# Patient Record
Sex: Female | Born: 1945
Health system: Southern US, Community
[De-identification: ages and names within clinical notes are randomized; demographics above are authoritative.]

## PROBLEM LIST (undated history)

## (undated) ENCOUNTER — Emergency Department (HOSPITAL_COMMUNITY): Admission: EM | Payer: Medicare Other

## (undated) DIAGNOSIS — K219 Gastro-esophageal reflux disease without esophagitis: Secondary | ICD-10-CM

## (undated) DIAGNOSIS — F419 Anxiety disorder, unspecified: Secondary | ICD-10-CM

## (undated) DIAGNOSIS — Z8489 Family history of other specified conditions: Secondary | ICD-10-CM

## (undated) DIAGNOSIS — T4145XA Adverse effect of unspecified anesthetic, initial encounter: Secondary | ICD-10-CM

## (undated) DIAGNOSIS — Z8719 Personal history of other diseases of the digestive system: Secondary | ICD-10-CM

## (undated) DIAGNOSIS — I499 Cardiac arrhythmia, unspecified: Secondary | ICD-10-CM

## (undated) DIAGNOSIS — I519 Heart disease, unspecified: Secondary | ICD-10-CM

## (undated) DIAGNOSIS — I341 Nonrheumatic mitral (valve) prolapse: Secondary | ICD-10-CM

## (undated) DIAGNOSIS — Z90710 Acquired absence of both cervix and uterus: Secondary | ICD-10-CM

## (undated) DIAGNOSIS — I10 Essential (primary) hypertension: Secondary | ICD-10-CM

## (undated) DIAGNOSIS — H409 Unspecified glaucoma: Secondary | ICD-10-CM

## (undated) DIAGNOSIS — M5430 Sciatica, unspecified side: Secondary | ICD-10-CM

## (undated) DIAGNOSIS — T8859XA Other complications of anesthesia, initial encounter: Secondary | ICD-10-CM

## (undated) DIAGNOSIS — R7303 Prediabetes: Secondary | ICD-10-CM

## (undated) DIAGNOSIS — M1990 Unspecified osteoarthritis, unspecified site: Secondary | ICD-10-CM

## (undated) DIAGNOSIS — F410 Panic disorder [episodic paroxysmal anxiety] without agoraphobia: Secondary | ICD-10-CM

## (undated) HISTORY — DX: Nonrheumatic mitral (valve) prolapse: I34.1

## (undated) HISTORY — DX: Acquired absence of both cervix and uterus: Z90.710

## (undated) HISTORY — DX: Panic disorder (episodic paroxysmal anxiety): F41.0

## (undated) HISTORY — PX: COLONOSCOPY: SHX174

## (undated) HISTORY — PX: HAND SURGERY: SHX662

## (undated) HISTORY — DX: Heart disease, unspecified: I51.9

## (undated) HISTORY — DX: Unspecified glaucoma: H40.9

## (undated) HISTORY — PX: PLANTAR FASCIA RELEASE: SHX2239

## (undated) HISTORY — DX: Sciatica, unspecified side: M54.30

## (undated) HISTORY — DX: Gastro-esophageal reflux disease without esophagitis: K21.9

## (undated) HISTORY — DX: Essential (primary) hypertension: I10

## (undated) HISTORY — DX: Anxiety disorder, unspecified: F41.9

---

## 1970-06-17 HISTORY — PX: APPENDECTOMY: SHX54

## 1976-06-17 DIAGNOSIS — Z90710 Acquired absence of both cervix and uterus: Secondary | ICD-10-CM

## 1976-06-17 HISTORY — DX: Acquired absence of both cervix and uterus: Z90.710

## 1985-06-17 HISTORY — PX: OTHER SURGICAL HISTORY: SHX169

## 2011-06-18 HISTORY — PX: CATARACT EXTRACTION: SUR2

## 2016-06-17 HISTORY — PX: HEMORRHOID SURGERY: SHX153

## 2016-07-10 DIAGNOSIS — K625 Hemorrhage of anus and rectum: Secondary | ICD-10-CM | POA: Diagnosis not present

## 2016-07-10 DIAGNOSIS — K648 Other hemorrhoids: Secondary | ICD-10-CM | POA: Diagnosis not present

## 2016-07-10 DIAGNOSIS — K6289 Other specified diseases of anus and rectum: Secondary | ICD-10-CM | POA: Diagnosis not present

## 2016-07-23 DIAGNOSIS — H401123 Primary open-angle glaucoma, left eye, severe stage: Secondary | ICD-10-CM | POA: Diagnosis not present

## 2016-07-23 DIAGNOSIS — Z961 Presence of intraocular lens: Secondary | ICD-10-CM | POA: Diagnosis not present

## 2016-07-23 DIAGNOSIS — H26493 Other secondary cataract, bilateral: Secondary | ICD-10-CM | POA: Diagnosis not present

## 2016-07-23 DIAGNOSIS — H04123 Dry eye syndrome of bilateral lacrimal glands: Secondary | ICD-10-CM | POA: Diagnosis not present

## 2016-07-23 DIAGNOSIS — H401112 Primary open-angle glaucoma, right eye, moderate stage: Secondary | ICD-10-CM | POA: Diagnosis not present

## 2016-08-28 DIAGNOSIS — K648 Other hemorrhoids: Secondary | ICD-10-CM | POA: Diagnosis not present

## 2016-08-28 DIAGNOSIS — K6289 Other specified diseases of anus and rectum: Secondary | ICD-10-CM | POA: Diagnosis not present

## 2016-08-28 DIAGNOSIS — K625 Hemorrhage of anus and rectum: Secondary | ICD-10-CM | POA: Diagnosis not present

## 2016-10-07 DIAGNOSIS — K625 Hemorrhage of anus and rectum: Secondary | ICD-10-CM | POA: Diagnosis not present

## 2016-10-07 DIAGNOSIS — K648 Other hemorrhoids: Secondary | ICD-10-CM | POA: Diagnosis not present

## 2016-10-23 DIAGNOSIS — H26493 Other secondary cataract, bilateral: Secondary | ICD-10-CM | POA: Diagnosis not present

## 2016-10-23 DIAGNOSIS — H402212 Chronic angle-closure glaucoma, right eye, moderate stage: Secondary | ICD-10-CM | POA: Diagnosis not present

## 2016-10-23 DIAGNOSIS — H402223 Chronic angle-closure glaucoma, left eye, severe stage: Secondary | ICD-10-CM | POA: Diagnosis not present

## 2016-10-23 DIAGNOSIS — H401123 Primary open-angle glaucoma, left eye, severe stage: Secondary | ICD-10-CM | POA: Diagnosis not present

## 2016-10-23 DIAGNOSIS — Z961 Presence of intraocular lens: Secondary | ICD-10-CM | POA: Diagnosis not present

## 2016-11-13 DIAGNOSIS — I509 Heart failure, unspecified: Secondary | ICD-10-CM | POA: Diagnosis not present

## 2016-11-13 DIAGNOSIS — I10 Essential (primary) hypertension: Secondary | ICD-10-CM | POA: Diagnosis not present

## 2016-11-13 DIAGNOSIS — I081 Rheumatic disorders of both mitral and tricuspid valves: Secondary | ICD-10-CM | POA: Diagnosis not present

## 2016-11-13 DIAGNOSIS — I059 Rheumatic mitral valve disease, unspecified: Secondary | ICD-10-CM | POA: Diagnosis not present

## 2016-11-13 DIAGNOSIS — R002 Palpitations: Secondary | ICD-10-CM | POA: Diagnosis not present

## 2016-11-13 DIAGNOSIS — I110 Hypertensive heart disease with heart failure: Secondary | ICD-10-CM | POA: Diagnosis not present

## 2016-12-02 DIAGNOSIS — M791 Myalgia: Secondary | ICD-10-CM | POA: Diagnosis not present

## 2016-12-03 LAB — BASIC METABOLIC PANEL
BUN: 14 (ref 4–21)
Creatinine: 0.8 (ref 0.5–1.1)
GLUCOSE: 135
Potassium: 4 (ref 3.4–5.3)
Sodium: 138 (ref 137–147)

## 2016-12-03 LAB — CBC AND DIFFERENTIAL
HCT: 38 (ref 36–46)
HEMOGLOBIN: 12.4 (ref 12.0–16.0)
NEUTROS ABS: 54
PLATELETS: 204 (ref 150–399)
WBC: 4.6

## 2016-12-03 LAB — HEPATIC FUNCTION PANEL
ALT: 14 (ref 7–35)
AST: 17 (ref 13–35)

## 2016-12-11 DIAGNOSIS — H401112 Primary open-angle glaucoma, right eye, moderate stage: Secondary | ICD-10-CM | POA: Diagnosis not present

## 2016-12-11 DIAGNOSIS — H26493 Other secondary cataract, bilateral: Secondary | ICD-10-CM | POA: Diagnosis not present

## 2016-12-11 DIAGNOSIS — H401123 Primary open-angle glaucoma, left eye, severe stage: Secondary | ICD-10-CM | POA: Diagnosis not present

## 2016-12-11 DIAGNOSIS — H402223 Chronic angle-closure glaucoma, left eye, severe stage: Secondary | ICD-10-CM | POA: Diagnosis not present

## 2016-12-12 DIAGNOSIS — Z1231 Encounter for screening mammogram for malignant neoplasm of breast: Secondary | ICD-10-CM | POA: Diagnosis not present

## 2016-12-25 DIAGNOSIS — H401112 Primary open-angle glaucoma, right eye, moderate stage: Secondary | ICD-10-CM | POA: Diagnosis not present

## 2016-12-25 DIAGNOSIS — H402212 Chronic angle-closure glaucoma, right eye, moderate stage: Secondary | ICD-10-CM | POA: Diagnosis not present

## 2016-12-25 DIAGNOSIS — H401123 Primary open-angle glaucoma, left eye, severe stage: Secondary | ICD-10-CM | POA: Diagnosis not present

## 2016-12-25 DIAGNOSIS — H402223 Chronic angle-closure glaucoma, left eye, severe stage: Secondary | ICD-10-CM | POA: Diagnosis not present

## 2017-01-09 DIAGNOSIS — M79604 Pain in right leg: Secondary | ICD-10-CM | POA: Diagnosis not present

## 2017-01-14 DIAGNOSIS — H402223 Chronic angle-closure glaucoma, left eye, severe stage: Secondary | ICD-10-CM | POA: Diagnosis not present

## 2017-01-23 DIAGNOSIS — M722 Plantar fascial fibromatosis: Secondary | ICD-10-CM | POA: Diagnosis not present

## 2017-01-23 DIAGNOSIS — M19041 Primary osteoarthritis, right hand: Secondary | ICD-10-CM | POA: Diagnosis not present

## 2017-01-23 DIAGNOSIS — M19042 Primary osteoarthritis, left hand: Secondary | ICD-10-CM | POA: Diagnosis not present

## 2017-01-29 DIAGNOSIS — H402212 Chronic angle-closure glaucoma, right eye, moderate stage: Secondary | ICD-10-CM | POA: Diagnosis not present

## 2017-07-23 DIAGNOSIS — L290 Pruritus ani: Secondary | ICD-10-CM | POA: Diagnosis not present

## 2017-07-23 DIAGNOSIS — M62838 Other muscle spasm: Secondary | ICD-10-CM | POA: Diagnosis not present

## 2017-07-23 DIAGNOSIS — K629 Disease of anus and rectum, unspecified: Secondary | ICD-10-CM | POA: Diagnosis not present

## 2017-07-23 DIAGNOSIS — K641 Second degree hemorrhoids: Secondary | ICD-10-CM | POA: Diagnosis not present

## 2017-08-20 DIAGNOSIS — K648 Other hemorrhoids: Secondary | ICD-10-CM | POA: Diagnosis not present

## 2017-08-20 DIAGNOSIS — K6289 Other specified diseases of anus and rectum: Secondary | ICD-10-CM | POA: Diagnosis not present

## 2017-08-28 DIAGNOSIS — Z7982 Long term (current) use of aspirin: Secondary | ICD-10-CM | POA: Diagnosis not present

## 2017-08-28 DIAGNOSIS — K219 Gastro-esophageal reflux disease without esophagitis: Secondary | ICD-10-CM | POA: Diagnosis not present

## 2017-08-28 DIAGNOSIS — I10 Essential (primary) hypertension: Secondary | ICD-10-CM | POA: Diagnosis not present

## 2017-08-28 DIAGNOSIS — K6289 Other specified diseases of anus and rectum: Secondary | ICD-10-CM | POA: Diagnosis not present

## 2017-08-28 DIAGNOSIS — R011 Cardiac murmur, unspecified: Secondary | ICD-10-CM | POA: Diagnosis not present

## 2017-08-28 DIAGNOSIS — R7303 Prediabetes: Secondary | ICD-10-CM | POA: Diagnosis not present

## 2017-08-28 DIAGNOSIS — Z79899 Other long term (current) drug therapy: Secondary | ICD-10-CM | POA: Diagnosis not present

## 2017-10-14 DIAGNOSIS — I081 Rheumatic disorders of both mitral and tricuspid valves: Secondary | ICD-10-CM | POA: Diagnosis not present

## 2017-10-14 DIAGNOSIS — I10 Essential (primary) hypertension: Secondary | ICD-10-CM | POA: Diagnosis not present

## 2017-10-14 DIAGNOSIS — I059 Rheumatic mitral valve disease, unspecified: Secondary | ICD-10-CM | POA: Diagnosis not present

## 2017-10-14 DIAGNOSIS — E7800 Pure hypercholesterolemia, unspecified: Secondary | ICD-10-CM | POA: Diagnosis not present

## 2017-10-14 DIAGNOSIS — I341 Nonrheumatic mitral (valve) prolapse: Secondary | ICD-10-CM | POA: Diagnosis not present

## 2017-10-24 DIAGNOSIS — Z803 Family history of malignant neoplasm of breast: Secondary | ICD-10-CM | POA: Diagnosis not present

## 2017-10-24 DIAGNOSIS — Z1231 Encounter for screening mammogram for malignant neoplasm of breast: Secondary | ICD-10-CM | POA: Diagnosis not present

## 2017-11-25 DIAGNOSIS — I059 Rheumatic mitral valve disease, unspecified: Secondary | ICD-10-CM | POA: Diagnosis not present

## 2017-11-25 DIAGNOSIS — R7301 Impaired fasting glucose: Secondary | ICD-10-CM | POA: Diagnosis not present

## 2017-11-25 DIAGNOSIS — K219 Gastro-esophageal reflux disease without esophagitis: Secondary | ICD-10-CM | POA: Diagnosis not present

## 2017-11-25 DIAGNOSIS — I10 Essential (primary) hypertension: Secondary | ICD-10-CM | POA: Diagnosis not present

## 2017-11-25 DIAGNOSIS — E7800 Pure hypercholesterolemia, unspecified: Secondary | ICD-10-CM | POA: Diagnosis not present

## 2017-11-26 DIAGNOSIS — H04123 Dry eye syndrome of bilateral lacrimal glands: Secondary | ICD-10-CM | POA: Diagnosis not present

## 2017-11-26 DIAGNOSIS — H401123 Primary open-angle glaucoma, left eye, severe stage: Secondary | ICD-10-CM | POA: Diagnosis not present

## 2017-11-26 DIAGNOSIS — H401112 Primary open-angle glaucoma, right eye, moderate stage: Secondary | ICD-10-CM | POA: Diagnosis not present

## 2017-11-27 DIAGNOSIS — H401123 Primary open-angle glaucoma, left eye, severe stage: Secondary | ICD-10-CM | POA: Diagnosis not present

## 2017-11-27 DIAGNOSIS — H401112 Primary open-angle glaucoma, right eye, moderate stage: Secondary | ICD-10-CM | POA: Diagnosis not present

## 2017-12-31 DIAGNOSIS — M722 Plantar fascial fibromatosis: Secondary | ICD-10-CM | POA: Diagnosis not present

## 2018-01-14 ENCOUNTER — Ambulatory Visit (INDEPENDENT_AMBULATORY_CARE_PROVIDER_SITE_OTHER): Payer: BLUE CROSS/BLUE SHIELD | Admitting: Family Medicine

## 2018-01-14 ENCOUNTER — Encounter: Payer: Self-pay | Admitting: Family Medicine

## 2018-01-14 ENCOUNTER — Ambulatory Visit (INDEPENDENT_AMBULATORY_CARE_PROVIDER_SITE_OTHER): Payer: BLUE CROSS/BLUE SHIELD

## 2018-01-14 VITALS — BP 126/84 | HR 67 | Temp 98.2°F | Ht 63.0 in | Wt 147.4 lb

## 2018-01-14 DIAGNOSIS — F410 Panic disorder [episodic paroxysmal anxiety] without agoraphobia: Secondary | ICD-10-CM

## 2018-01-14 DIAGNOSIS — R140 Abdominal distension (gaseous): Secondary | ICD-10-CM

## 2018-01-14 DIAGNOSIS — K219 Gastro-esophageal reflux disease without esophagitis: Secondary | ICD-10-CM

## 2018-01-14 DIAGNOSIS — H409 Unspecified glaucoma: Secondary | ICD-10-CM

## 2018-01-14 DIAGNOSIS — I10 Essential (primary) hypertension: Secondary | ICD-10-CM | POA: Diagnosis not present

## 2018-01-14 DIAGNOSIS — R1013 Epigastric pain: Secondary | ICD-10-CM | POA: Diagnosis not present

## 2018-01-14 MED ORDER — SUCRALFATE 1 G PO TABS: 1.0000 g | ORAL_TABLET | Freq: Three times a day (TID) | ORAL | 0 refills | Status: DC

## 2018-01-14 NOTE — Progress Notes (Signed)
Terri Barajas is a 72 y.o. female is here to [place].   Patient Care Team: Briscoe Deutscher, DO as PCP - General (Family Medicine) Rossie Muskrat, MD as Referring Physician (Podiatry)   History of Present Illness:   HPI: Patient c/o epigastric pain and heartburn. No radiation to right side or back. Hx of the same. S/P EGD that she said was normal. On Dexilant. Feels that it is partially due to diet. No ETOH. No CP, SOB, edema, fever, melena, vomiting. With abdominal bloating. Last BM yesterday.   Health Maintenance Due  Topic Date Due  . Hepatitis C Screening  02/05/1946  . TETANUS/TDAP  10/21/1966  . MAMMOGRAM  10/20/1997  . COLONOSCOPY  10/20/1997  . DEXA SCAN  10/20/2012  . PNA vac Low Risk Adult (1 of 2 - PCV13) 10/20/2012  . INFLUENZA VACCINE  01/15/2018   Depression screen PHQ 2/9 01/14/2018  Decreased Interest 0  Down, Depressed, Hopeless 0  PHQ - 2 Score 0    PMHx, SurgHx, SocialHx, Medications, and Allergies were reviewed in the Visit Navigator and updated as appropriate.   Past Medical History:  Diagnosis Date  . GERD (gastroesophageal reflux disease)   . Glaucoma   . Heart disease   . Hypertension      Past Surgical History:  Procedure Laterality Date  . APPENDECTOMY  1972  . [place]  . S/P Hysterectomy   1987     Family History  Problem Relation Age of Onset  . Cancer Mother   . Alcohol abuse Father   . Asthma Sister   . Hypertension Sister   . Arthritis Sister   . Diabetes Brother   . Arthritis Brother   . Hyperlipidemia Brother   . Hypertension Brother   . Arthritis Sister   . Arthritis Sister     Social History   Tobacco Use  . Smoking status: Never Smoker  . Smokeless tobacco: Never Used  Substance Use Topics  . Alcohol use: Not Currently  . Drug use: Not Currently    Current Medications and Allergies:   .  ALPRAZolam (XANAX) 0.5 MG tablet, Take 0.5 mg by mouth at bedtime as needed for  anxiety., Disp: , Rfl:  .  aspirin EC 81 MG tablet, Take 81 mg by mouth daily., Disp: , Rfl:  .  Biotin 10000 MCG TABS, Take by mouth., Disp: , Rfl:  .  dexlansoprazole (DEXILANT) 60 MG capsule, Take 60 mg by mouth daily., Disp: , Rfl:  .  hydrochlorothiazide (HYDRODIURIL) 25 MG tablet, Take 25 mg by mouth daily. One tab every other day., Disp: , Rfl:  .  latanoprost (XALATAN) 0.005 % ophthalmic solution, 1 drop at bedtime., Disp: , Rfl:  .  nadolol (CORGARD) 20 MG tablet, Take 20 mg by mouth daily., Disp: , Rfl:     Allergies  Allergen Reactions  . Atracurium & Derivatives   . Iodine   . Shellfish Allergy    Review of Systems:   Pertinent items are noted in the HPI. Otherwise, ROS is negative.  Vitals:   Vitals:   01/14/18 1502  BP: 126/84  Pulse: 67  Temp: 98.2 F (36.8 C)  TempSrc: Oral  SpO2: 94%  Weight: 147 lb 6.4 oz (66.9 kg)  Height: 5\' 3"  (1.6 m)     Body mass index is 26.11 kg/m.  Physical Exam:   Physical Exam  Constitutional: She appears well-nourished.  HENT:  Head: Normocephalic and atraumatic.  Eyes:  Pupils are equal, round, and reactive to light. EOM are normal.  Neck: Normal range of motion. Neck supple.  Cardiovascular: Normal rate, regular rhythm, normal heart sounds and intact distal pulses.  Pulmonary/Chest: Effort normal.  Abdominal: Soft. She exhibits distension. There is tenderness in the epigastric area. There is no rigidity and no guarding.  Skin: Skin is warm.  Psychiatric: She has a normal mood and affect. Her behavior is normal.  Nursing note and vitals reviewed.  Assessment and Plan:   Terri Barajas was seen today for establish care.  Diagnoses and all orders for this visit:  Dyspepsia -     CBC with Differential/Platelet -     Comprehensive metabolic panel -     H. pylori antibody, IgG -     Lipase -     sucralfate (CARAFATE) 1 g tablet; Take 1 tablet (1 g total) by mouth 4 (four) times daily -  with meals and at  bedtime.  Abdominal bloating -     DG Abd 1 View  Gastroesophageal reflux disease without esophagitis  Glaucoma, unspecified glaucoma type, unspecified laterality  Essential hypertension  Panic attacks    . Reviewed expectations re: course of current medical issues. . Discussed self-management of symptoms. . Outlined signs and symptoms indicating need for more acute intervention. . Patient verbalized understanding and all questions were answered. Marland Kitchen Health Maintenance issues including appropriate healthy diet, exercise, and smoking avoidance were discussed with patient. . See orders for this visit as documented in the electronic medical record. . Patient received an After Visit Summary.  Briscoe Deutscher, DO [place], [facility] [place] [facility] 01/15/2018

## 2018-01-14 NOTE — Patient Instructions (Signed)
Food Choices for Gastroesophageal Reflux Disease, Adult When you have gastroesophageal reflux disease (GERD), the foods you eat and your eating habits are very important. Choosing the right foods can help ease the discomfort of GERD. Consider working with a diet and nutrition specialist (dietitian) to help you make healthy food choices. What general guidelines should I follow? Eating plan  Choose healthy foods low in fat, such as fruits, vegetables, whole grains, low-fat dairy products, and lean meat, fish, and poultry.  Eat frequent, small meals instead of three large meals each day. Eat your meals slowly, in a relaxed setting. Avoid bending over or lying down until 2-3 hours after eating.  Limit high-fat foods such as fatty meats or fried foods.  Limit your intake of oils, butter, and shortening to less than 8 teaspoons each day.  Avoid the following: ? Foods that cause symptoms. These may be different for different people. Keep a food diary to keep track of foods that cause symptoms. ? Alcohol. ? Drinking large amounts of liquid with meals. ? Eating meals during the 2-3 hours before bed.  Cook foods using methods other than frying. This may include baking, grilling, or broiling. Lifestyle   Maintain a healthy weight. Ask your health care provider what weight is healthy for you. If you need to lose weight, work with your health care provider to do so safely.  Exercise for at least 30 minutes on 5 or more days each week, or as told by your health care provider.  Avoid wearing clothes that fit tightly around your waist and chest.  Do not use any products that contain nicotine or tobacco, such as cigarettes and e-cigarettes. If you need help quitting, ask your health care provider.  Sleep with the head of your bed raised. Use a wedge under the mattress or blocks under the bed frame to raise the head of the bed. What foods are not recommended? The items listed may not be a complete  list. Talk with your dietitian about what dietary choices are best for you. Grains Pastries or quick breads with added fat. French toast. Vegetables Deep fried vegetables. French fries. Any vegetables prepared with added fat. Any vegetables that cause symptoms. For some people this may include tomatoes and tomato products, chili peppers, onions and garlic, and horseradish. Fruits Any fruits prepared with added fat. Any fruits that cause symptoms. For some people this may include citrus fruits, such as oranges, grapefruit, pineapple, and lemons. Meats and other protein foods High-fat meats, such as fatty beef or pork, hot dogs, ribs, ham, sausage, salami and bacon. Fried meat or protein, including fried fish and fried chicken. Nuts and nut butters. Dairy Whole milk and chocolate milk. Sour cream. Cream. Ice cream. Cream cheese. Milk shakes. Beverages Coffee and tea, with or without caffeine. Carbonated beverages. Sodas. Energy drinks. Fruit juice made with acidic fruits (such as orange or grapefruit). Tomato juice. Alcoholic drinks. Fats and oils Butter. Margarine. Shortening. Ghee. Sweets and desserts Chocolate and cocoa. Donuts. Seasoning and other foods Pepper. Peppermint and spearmint. Any condiments, herbs, or seasonings that cause symptoms. For some people, this may include curry, hot sauce, or vinegar-based salad dressings. Summary  When you have gastroesophageal reflux disease (GERD), food and lifestyle choices are very important to help ease the discomfort of GERD.  Eat frequent, small meals instead of three large meals each day. Eat your meals slowly, in a relaxed setting. Avoid bending over or lying down until 2-3 hours after eating.  Limit high-fat   foods such as fatty meat or fried foods. This information is not intended to replace advice given to you by your health care provider. Make sure you discuss any questions you have with your health care provider. Document Released:  06/03/2005 Document Revised: 06/04/2016 Document Reviewed: 06/04/2016 Elsevier Interactive Patient Education  2018 Elsevier Inc.  

## 2018-01-15 ENCOUNTER — Encounter: Payer: Self-pay | Admitting: Family Medicine

## 2018-01-15 ENCOUNTER — Other Ambulatory Visit: Payer: Self-pay

## 2018-01-15 DIAGNOSIS — H409 Unspecified glaucoma: Secondary | ICD-10-CM | POA: Insufficient documentation

## 2018-01-15 DIAGNOSIS — F410 Panic disorder [episodic paroxysmal anxiety] without agoraphobia: Secondary | ICD-10-CM | POA: Insufficient documentation

## 2018-01-15 DIAGNOSIS — I119 Hypertensive heart disease without heart failure: Secondary | ICD-10-CM | POA: Insufficient documentation

## 2018-01-15 DIAGNOSIS — K219 Gastro-esophageal reflux disease without esophagitis: Secondary | ICD-10-CM | POA: Insufficient documentation

## 2018-01-15 DIAGNOSIS — I10 Essential (primary) hypertension: Secondary | ICD-10-CM | POA: Insufficient documentation

## 2018-01-15 LAB — CBC WITH DIFFERENTIAL/PLATELET
Basophils Absolute: 0.1 10*3/uL (ref 0.0–0.1)
Basophils Relative: 0.8 % (ref 0.0–3.0)
Eosinophils Absolute: 0.1 10*3/uL (ref 0.0–0.7)
Eosinophils Relative: 1.7 % (ref 0.0–5.0)
HCT: 38.7 % (ref 36.0–46.0)
Hemoglobin: 13.1 g/dL (ref 12.0–15.0)
Lymphocytes Relative: 19.5 % (ref 12.0–46.0)
Lymphs Abs: 1.5 10*3/uL (ref 0.7–4.0)
MCHC: 33.8 g/dL (ref 30.0–36.0)
MCV: 90.5 fl (ref 78.0–100.0)
Monocytes Absolute: 0.5 10*3/uL (ref 0.1–1.0)
Monocytes Relative: 6.8 % (ref 3.0–12.0)
Neutro Abs: 5.6 10*3/uL (ref 1.4–7.7)
Neutrophils Relative %: 71.2 % (ref 43.0–77.0)
Platelets: 236 10*3/uL (ref 150.0–400.0)
RBC: 4.27 Mil/uL (ref 3.87–5.11)
RDW: 12.7 % (ref 11.5–15.5)
WBC: 7.9 10*3/uL (ref 4.0–10.5)

## 2018-01-15 LAB — COMPREHENSIVE METABOLIC PANEL
ALT: 15 U/L (ref 0–35)
AST: 16 U/L (ref 0–37)
Albumin: 4.3 g/dL (ref 3.5–5.2)
Alkaline Phosphatase: 71 U/L (ref 39–117)
BUN: 16 mg/dL (ref 6–23)
CO2: 31 mEq/L (ref 19–32)
Calcium: 10.1 mg/dL (ref 8.4–10.5)
Chloride: 99 mEq/L (ref 96–112)
Creatinine, Ser: 0.85 mg/dL (ref 0.40–1.20)
GFR: 70.23 mL/min (ref >60.00)
Glucose, Bld: 91 mg/dL (ref 70–99)
Potassium: 3.7 mEq/L (ref 3.5–5.1)
Sodium: 138 mEq/L (ref 135–145)
Total Bilirubin: 0.6 mg/dL (ref 0.2–1.2)
Total Protein: 7.8 g/dL (ref 6.0–8.3)

## 2018-01-15 LAB — LIPASE: Lipase: 99 U/L — ABNORMAL HIGH (ref 11.0–59.0)

## 2018-01-15 LAB — H. PYLORI ANTIBODY, IGG: H Pylori IgG: NEGATIVE

## 2018-01-16 MED ORDER — NA SULFATE-K SULFATE-MG SULF 17.5-3.13-1.6 GM/177ML PO SOLN: ORAL | 0 refills | Status: DC

## 2018-01-16 NOTE — Addendum Note (Signed)
Addended by: Briscoe Deutscher R on: 01/16/2018 05:58 AM   Modules accepted: Orders

## 2018-01-16 NOTE — Progress Notes (Signed)
Sent. [email or web address] for instructions.

## 2018-01-19 ENCOUNTER — Ambulatory Visit: Payer: Self-pay | Admitting: *Deleted

## 2018-01-19 NOTE — Telephone Encounter (Signed)
Pt has questions about medication (sucrafate) states that prescription instruction, per Dr Briscoe Deutscher; she states that she was instructed to take medication with meals but label on bottle say take before meals; conference call intimated with Lowella Grip, Pharmacy Tech at [facility], and she clarified that the pt should take medication per MD instruction; the  verbalizes understanding.

## 2018-01-19 NOTE — Telephone Encounter (Signed)
Pt has questions related to instructions for taking sucrafate; see telephone encounter dated 01/19/18 Reason for Disposition . Caller has medication question only, adult not sick, and triager answers question  Protocols used: MEDICATION QUESTION CALL-A-AH

## 2018-01-21 ENCOUNTER — Other Ambulatory Visit: Payer: Self-pay

## 2018-01-21 DIAGNOSIS — R140 Abdominal distension (gaseous): Secondary | ICD-10-CM

## 2018-01-21 DIAGNOSIS — R112 Nausea with vomiting, unspecified: Secondary | ICD-10-CM

## 2018-01-22 ENCOUNTER — Ambulatory Visit (INDEPENDENT_AMBULATORY_CARE_PROVIDER_SITE_OTHER)
Admission: RE | Admit: 2018-01-22 | Discharge: 2018-01-22 | Disposition: A | Discharge status: Home / Self Care | Payer: BLUE CROSS/BLUE SHIELD | Source: Ambulatory Visit | Attending: Family Medicine | Admitting: Family Medicine

## 2018-01-22 DIAGNOSIS — R112 Nausea with vomiting, unspecified: Secondary | ICD-10-CM | POA: Diagnosis not present

## 2018-01-22 DIAGNOSIS — R140 Abdominal distension (gaseous): Secondary | ICD-10-CM | POA: Diagnosis not present

## 2018-01-22 DIAGNOSIS — R1013 Epigastric pain: Secondary | ICD-10-CM | POA: Diagnosis not present

## 2018-01-23 ENCOUNTER — Encounter: Payer: Self-pay | Admitting: Family Medicine

## 2018-01-23 ENCOUNTER — Encounter: Payer: Self-pay | Admitting: Gastroenterology

## 2018-01-23 ENCOUNTER — Other Ambulatory Visit: Payer: Self-pay

## 2018-01-23 DIAGNOSIS — I700 Atherosclerosis of aorta: Secondary | ICD-10-CM | POA: Insufficient documentation

## 2018-01-23 DIAGNOSIS — R140 Abdominal distension (gaseous): Secondary | ICD-10-CM

## 2018-02-10 ENCOUNTER — Ambulatory Visit: Payer: Self-pay | Admitting: Family Medicine

## 2018-02-17 ENCOUNTER — Encounter: Payer: Self-pay | Admitting: Gastroenterology

## 2018-02-17 ENCOUNTER — Other Ambulatory Visit (INDEPENDENT_AMBULATORY_CARE_PROVIDER_SITE_OTHER): Payer: BLUE CROSS/BLUE SHIELD

## 2018-02-17 ENCOUNTER — Encounter

## 2018-02-17 ENCOUNTER — Ambulatory Visit (INDEPENDENT_AMBULATORY_CARE_PROVIDER_SITE_OTHER): Payer: BLUE CROSS/BLUE SHIELD | Admitting: Gastroenterology

## 2018-02-17 VITALS — BP 126/70 | HR 68 | Ht 63.0 in | Wt 144.2 lb

## 2018-02-17 DIAGNOSIS — R109 Unspecified abdominal pain: Secondary | ICD-10-CM

## 2018-02-17 DIAGNOSIS — R140 Abdominal distension (gaseous): Secondary | ICD-10-CM

## 2018-02-17 DIAGNOSIS — K5900 Constipation, unspecified: Secondary | ICD-10-CM | POA: Diagnosis not present

## 2018-02-17 DIAGNOSIS — K449 Diaphragmatic hernia without obstruction or gangrene: Secondary | ICD-10-CM | POA: Diagnosis not present

## 2018-02-17 DIAGNOSIS — R748 Abnormal levels of other serum enzymes: Secondary | ICD-10-CM

## 2018-02-17 LAB — IGA: IgA: 300 mg/dL (ref 68–378)

## 2018-02-17 LAB — TSH: TSH: 1.08 u[IU]/mL (ref 0.35–4.50)

## 2018-02-17 NOTE — Patient Instructions (Signed)
Normal BMI (Body Mass Index- based on height and weight) is between 23 and 30. Your BMI today is Body mass index is 25.54 kg/m. Marland Kitchen Please consider follow up  regarding your BMI with your Primary Care Provider.  Your provider has requested that you go to the basement level for lab work before leaving today. Press "B" on the elevator. The lab is located at the first door on the left as you exit the elevator.  Please purchase the following medications over the counter and take as directed: Fibercon caps once daily Miralax one capful daily for 1 week then increase to twice daily   We have scheduled you for a follow up appointment with Dr Rush Landmark on 03/17/18 at 10:30am

## 2018-02-17 NOTE — Progress Notes (Signed)
GASTROENTEROLOGY OUTPATIENT CLINIC VISIT   Primary Care Provider Briscoe Deutscher, [place] [address] [phone]  Referring Provider Briscoe Deutscher, [address] [place], [place] [address] [phone]  Patient Profile: Kashena Novitski is a 72 y.o. female with a pmh significant for HTN, Glaucoma, MVP, GERD, reported Hiatal Hernia.  The patient presents to the [facility] for an evaluation and management of problem(s) noted below:  Problem List 1. Bloating   2. Constipation, unspecified constipation type   3. Abdominal pain, unspecified abdominal location   4. Hiatal hernia   5. Elevated lipase     History of Present Illness: This is the patient's first visit to the [facility] GI clinic.  She carries a history over the course the last few years of "GERD" for which she has been treated with Dexilant 60 mg daily for at least the last 3 to 4 years.  She describes a history of pyrosis/heartburn for which she eventually underwent at least one if not multiple upper endoscopies that reportedly showed "acid reflux changes" (not clear if this is esophagitis or just based on symptoms).  She has never been on less than Dexilant 60 mg.  The patient also describes a history of issues including chest discomfort that occurs postprandially.  This does not occur every time she eats however, if after she has eaten a meal she bends over to try and pick something up from the floor she feels a significant chest discomfort/chest pressure in the left upper quadrant and midepigastrium.  If he starts moving and walking she feels improvement in this particular area.  She describes a prior history of a possible hiatal hernia but we do not know the extent of this particular hernia.  A newer symptom over the course of the summer into this time.  Is a sensation of bloating in the abdomen with associated abdominal distention.  She does not think her diet has changed  since moving back to [place].  This bloating/abdominal distention can occur even in periods where she has not been eating.  She is tried Gas-X for this in the past as well as her Dexilant but does not feel that either of these have given her adequate relief.  With time being subside and she is able to continue in her activities of daily living.  She believes the last set of endoscopy and colonoscopy was performed in 2018 but when she looked back at some of her documentation it may have been 2015 or 2016 (we are going to work on trying to obtain these records).  She follows lifestyle modifications in regards to her acid reflux however she does not have a bed wedge but rather uses multiple pillows.  She has a reported shellfish allergy and is never had IV contrast but has not received any steroids in preparation for cross-sectional imaging.  In regards to her bowel habits, she describes having bowel movements every 2 to 3 days on occasion it is every 1 to 2 days.  The bowel movements are formed.  She almost never has to strain.  She denies any melena or hematochezia.  She has used Metamucil but feels she gets more bloated with Metamucil.  She thinks she has used MiraLAX in the past for a 1 to 2-week trial but was not sure that it made any difference for her constipation symptoms.  She has tried other over-the-counter laxatives but cannot recall the name (she thinks something start with the B).  She is on aspirin but takes no other nonsteroidals or BC/Goody powders.  GI Review of Systems Positive as above including very infrequent solid food dysphagia, nausea (only when she has significant chest pressure after bending over) Negative for odynophagia, globus, vomiting, jaundice, change in bowel movement frequency  Review of Systems General: Denies fevers/chills/weight loss HEENT: Denies oral ulcers Cardiovascular: Denies current chest pain/palpitations (describes no palpitations during time periods of her  chest pressure without radiation of the discomfort) Pulmonary: Denies shortness of breath Gastroenterological: See HPI Genitourinary: Denies hematuria or dark and urine Hematological: Denies easy bruising/bleeding Endocrine: Denies temperature intolerance Dermatological: Denies skin rashes Psychological: Mood is stable Musculoskeletal: Denies new arthralgias   Medications Current Outpatient Medications  Medication Sig Dispense Refill  . ALPRAZolam (XANAX) 0.5 MG tablet Take 0.25 mg by mouth at bedtime as needed for anxiety.     Marland Kitchen aspirin EC 81 MG tablet Take 81 mg by mouth daily.    . Biotin 10000 MCG TABS Take 1 tablet by mouth daily.     . bisacodyl (DULCOLAX) 5 MG EC tablet Take 5 mg by mouth as needed for moderate constipation.    Marland Kitchen dexlansoprazole (DEXILANT) 60 MG capsule Take 60 mg by mouth daily.    . hydrochlorothiazide (HYDRODIURIL) 25 MG tablet Take 25 mg by mouth every other day.     . latanoprost (XALATAN) 0.005 % ophthalmic solution Place 1 drop into both eyes at bedtime.     . nadolol (CORGARD) 20 MG tablet Take 20 mg by mouth daily.     No current facility-administered medications for this visit.    Allergies Allergies  Allergen Reactions  . Atracurium & Derivatives   . Atropine   . Iodine   . Shellfish Allergy    Histories Past Medical History:  Diagnosis Date  . Anxiety   . GERD (gastroesophageal reflux disease)   . Glaucoma   . Heart disease   . Hypertension   . Panic attacks    Past Surgical History:  Procedure Laterality Date  . APPENDECTOMY  1972  . [place]  . HAND SURGERY Bilateral   . HEMORRHOID SURGERY  2018  . S/P Hysterectomy   1987   Social History   Socioeconomic History  . Marital status: Married    Spouse name: Not on file  . Number of children: Not on file  . Years of education: 27  . Highest education level: Not on file  Occupational History  . Not on file  Social Needs  . Financial resource strain:  Not on file  . Food insecurity:    Worry: Not on file    Inability: Not on file  . Transportation needs:    Medical: Not on file    Non-medical: Not on file  Tobacco Use  . Smoking status: Never Smoker  . Smokeless tobacco: Never Used  Substance and Sexual Activity  . Alcohol use: Not Currently  . Drug use: Not Currently  . Sexual activity: Yes    Partners: Male  Lifestyle  . Physical activity:    Days per week: Not on file    Minutes per session: Not on file  . Stress: Not on file  Relationships  . Social connections:    Talks on phone: Not on file    Gets together: Not on file    Attends religious service: Not on file    Active member of club or organization: Not on file    Attends meetings  of clubs or organizations: Not on file    Relationship status: Not on file  . Intimate partner violence:    Fear of current or ex partner: Not on file    Emotionally abused: Not on file    Physically abused: Not on file    Forced sexual activity: Not on file  Other Topics Concern  . Not on file  Social History Narrative  . Not on file   Family History  Problem Relation Age of Onset  . Pancreatic cancer Mother   . Alcohol abuse Father   . Asthma Sister   . Hypertension Sister   . Arthritis Sister   . Diabetes Brother   . Arthritis Brother   . Hyperlipidemia Brother   . Hypertension Brother   . Glaucoma Brother   . Colon polyps Brother   . Arthritis Sister   . Arthritis Sister   . Prostate cancer Brother   . Colon cancer Neg Hx   . Esophageal cancer Neg Hx   . Inflammatory bowel disease Neg Hx   . Liver disease Neg Hx   . Rectal cancer Neg Hx   . Stomach cancer Neg Hx    I have reviewed her medical, social, and family history in detail and updated the electronic medical record as necessary.    PHYSICAL EXAMINATION  BP 126/70   Pulse 68   Ht 5\' 3"  (1.6 m)   Wt 144 lb 3.2 oz (65.4 kg)   BMI 25.54 kg/m  GEN: NAD, appears younger than stated age, doesn't appear  chronically ill PSYCH: Cooperative, without pressured speech EYE: Conjunctivae pink, sclerae anicteric ENT: MMM, without oral ulcers NECK: Supple CV: RR without R/Gs  RESP: CTAB posteriorly GI: NABS, soft, minimal tenderness to palpation in the midepigastrium, ND, without rebound or guarding, no HSM appreciated MSK/EXT: No lower extremity edema SKIN: No jaundice  NEURO:  Alert & Oriented x 3, no focal deficits   REVIEW OF DATA  I reviewed the following data at the time of this encounter:  GI Procedures and Studies  No relevant studies  Laboratory Studies  Reviewed in EPIC  Imaging Studies  8/19 CTAP w/o Contrast IMPRESSION: 1. Proximal to mid transverse colon area of mild circumferential narrowing may be related to under distension/peristalsis. Subtle mass cannot be excluded (series 5, image 18). 2. Under distended gastric fundus limits evaluation otherwise no gastric abnormality noted. 3. No extraluminal bowel inflammatory process 4. Aortic Atherosclerosis (ICD10-I70.0). Trace aortic calcifications Noted.  7/19 KUB FINDINGS: Gas and stool are present throughout the colon. No dilated loops of bowel are seen to suggest obstruction. No gross intraperitoneal free air is identified on this supine study. No acute osseous abnormality is identified. IMPRESSION: Negative.   ASSESSMENT  Ms. Saylor is a 72 y.o. female with a pmh significant for HTN, Glaucoma, MVP, GERD, reported Hiatal Hernia.  The patient is seen today for a return visit for evaluation and management of:  1. Bloating   2. Constipation, unspecified constipation type   3. Abdominal pain, unspecified abdominal location   4. Hiatal hernia   5. Elevated lipase    [name] constellation of symptoms in regards to the new onset bloating do sound as if a hiatal hernia could be playing a role.  With that being said what is interesting in her history and somewhat confusing is that she continues to describe  being told on EGDs that she has acid reflux changes on appropriate PPI therapy of Dexilant 60 mg daily.  If she is having breakthrough symptoms of true acid reflux in regards to esophagitis while being on therapy she may be a PPI failure or may need the addition of Gaviscon to try and aid in esophageal healing.  If this were true she would likely benefit from a consideration of a therapy for acid reflux.  She may require additional pH impedance if this were to be the case.  However I do think that other differentials including SIBO should be considered in this patient as well.  We will do a stepwise diagnostic evaluation as well as try to obtain all of her records from [place].  I would like to review these in depth to understand what she has meant and with her last endoscopic therapies were performed.  She describes never being told that she is been tested for H. pylori.  Although she is on PPI therapy and we recognize a false negative can occur when performing a stool antigen I would like to proceed with a stool antigen with this understanding and she recognizes this as well.  If positive we will treat.  I also described for the patient a need to obtain labs in the future such as pancreatic enzymes and liver tests just to ensure that she is not having some sort of biliary colic and she will obtain these within 6 to 8 hours of a severe episode of discomfort.  I would eventually like to bring down her PPI therapy to 30 mg if she can tolerate it however if they were true as it relates to the changes that are still occurring then we need to understand that.  If she is confirmed to have a hiatal hernia we may need to consider timed esophagram to evaluate for length of hiatal hernia should an endoscopy not be reperformed and then consider a referral to surgery.  We discussed also continuing lifestyle modifications for acid reflux and obtaining a bed wedge which she will try to do in the course the next few weeks.   Her slight elevation in lipase that was checked by her primary care doctor does not meet criteria for pancreatitis and although her cross-sectional imaging did not show any evidence of pancreatitis (it was without IV contrast) I do not believe her symptoms to be ongoing pancreatic but we will maintain this our minds moving forward.  All questions answered to the best of my ability today.   PLAN  1. Bloating - Workup for possible HH ongoing - Consideration of SIBO testing in future  2. Constipation, unspecified constipation type - Begin Fibercon OTC - Start Miralax once daily and then titrate to twice daily (if gets significant bloating then may hold on this - May consider role of Amitiza or Linzess if remains a significant issue  3. Abdominal pain, unspecified abdominal location - TSH; Future - Tissue transglutaminase, IgA; Future - Helicobacter pylori antigen det, stool; Future - IgA; Future - Amylase; Future - Lipase; Future - Hepatic function panel; Future - Will consider role of contrasted CTAP in future (though would need to be with steroid preparation)  4. Hiatal hernia - Obtain outside reports of endoscopy - Hold on surgical referral currently   Orders Placed This Encounter  Procedures  . TSH  . Tissue transglutaminase, IgA  . Helicobacter pylori antigen det, stool  . IgA  . Amylase  . Lipase  . Hepatic function panel    New Prescriptions   No medications on file   Modified Medications   No medications  on file    Planned Follow Up: No follow-ups on file.   [name], MD [facility] Office # [phone]

## 2018-02-18 DIAGNOSIS — K5900 Constipation, unspecified: Secondary | ICD-10-CM | POA: Insufficient documentation

## 2018-02-18 DIAGNOSIS — R109 Unspecified abdominal pain: Secondary | ICD-10-CM | POA: Insufficient documentation

## 2018-02-18 DIAGNOSIS — K449 Diaphragmatic hernia without obstruction or gangrene: Secondary | ICD-10-CM | POA: Insufficient documentation

## 2018-02-18 DIAGNOSIS — R140 Abdominal distension (gaseous): Secondary | ICD-10-CM | POA: Insufficient documentation

## 2018-02-18 DIAGNOSIS — R748 Abnormal levels of other serum enzymes: Secondary | ICD-10-CM | POA: Insufficient documentation

## 2018-02-18 LAB — TISSUE TRANSGLUTAMINASE, IGA: (TTG) AB, IGA: 1 U/mL

## 2018-02-19 ENCOUNTER — Other Ambulatory Visit (INDEPENDENT_AMBULATORY_CARE_PROVIDER_SITE_OTHER): Payer: BLUE CROSS/BLUE SHIELD

## 2018-02-19 ENCOUNTER — Other Ambulatory Visit: Payer: Self-pay | Admitting: Gastroenterology

## 2018-02-19 ENCOUNTER — Encounter: Payer: Self-pay | Admitting: Gastroenterology

## 2018-02-19 DIAGNOSIS — R140 Abdominal distension (gaseous): Secondary | ICD-10-CM

## 2018-02-19 DIAGNOSIS — K449 Diaphragmatic hernia without obstruction or gangrene: Secondary | ICD-10-CM | POA: Diagnosis not present

## 2018-02-19 DIAGNOSIS — R109 Unspecified abdominal pain: Secondary | ICD-10-CM | POA: Diagnosis not present

## 2018-02-19 DIAGNOSIS — K5900 Constipation, unspecified: Secondary | ICD-10-CM

## 2018-02-19 LAB — LIPASE: Lipase: 101 U/L — ABNORMAL HIGH (ref 11.0–59.0)

## 2018-02-19 LAB — HEPATIC FUNCTION PANEL
ALBUMIN: 4.2 g/dL (ref 3.5–5.2)
ALK PHOS: 70 U/L (ref 39–117)
ALT: 12 U/L (ref 0–35)
AST: 14 U/L (ref 0–37)
Bilirubin, Direct: 0 mg/dL (ref 0.0–0.3)
Total Bilirubin: 0.5 mg/dL (ref 0.2–1.2)
Total Protein: 7.6 g/dL (ref 6.0–8.3)

## 2018-02-19 LAB — AMYLASE: Amylase: 29 U/L (ref 27–131)

## 2018-02-19 NOTE — Progress Notes (Signed)
Patient was seen in office 02/17/18 with complaints of bloating and and abdominal pain. She was instructed by Dr Rush Landmark to come by the lab if she had a flare up within 6 hours to have labs drawn. Patient feels her symptoms are "severe" enough to have her labs done. Hepatic function,Lipase,Amylase drawn. Dr Rush Landmark will review results

## 2018-02-20 LAB — HELICOBACTER PYLORI  SPECIAL ANTIGEN
MICRO NUMBER:: 91062343
SPECIMEN QUALITY: ADEQUATE

## 2018-02-21 ENCOUNTER — Other Ambulatory Visit: Payer: Self-pay | Admitting: Gastroenterology

## 2018-02-21 ENCOUNTER — Encounter: Payer: Self-pay | Admitting: Gastroenterology

## 2018-02-21 DIAGNOSIS — R748 Abnormal levels of other serum enzymes: Secondary | ICD-10-CM

## 2018-02-21 DIAGNOSIS — R109 Unspecified abdominal pain: Secondary | ICD-10-CM

## 2018-02-21 NOTE — Progress Notes (Signed)
If she continues to have an elevation in lipase, may consider role of contrasted CT scan with steroid preparation after additional diagnostic workup as had been outlined is completed.  Interestingly, amylase is normal.  Will be improtant to see what her numbers looks like (LFTs/Amylase/Lipase) after a significant episode of pain for which she knows to return and get labs in 6-8 hours.  Negative for HP but on Dexilant so could be false-negative.  [name], MD [facility] Office # [phone]

## 2018-02-26 ENCOUNTER — Telehealth: Payer: Self-pay | Admitting: Gastroenterology

## 2018-02-26 NOTE — Telephone Encounter (Signed)
Your liver tests are normal.  Your thyroid function test is normal.  Your amylase (a pancreatic enzyme) was normal.  Your lipase (another pancreatic enzyme) was slightly high (not 2x the upper limit of normal) and does not meet criteria for pancreatitis.   Make sure, as we spoke in clinic, if you have a severe episode of pain/discomfort that you go and get labs as we discussed within 6-8 hours so we can see what your lab tests look like.  The pt has been advised of the labs and will keep appt as planned to discuss continued bloating

## 2018-03-01 ENCOUNTER — Encounter: Payer: Self-pay | Admitting: Gastroenterology

## 2018-03-01 DIAGNOSIS — K2270 Barrett's esophagus without dysplasia: Secondary | ICD-10-CM | POA: Insufficient documentation

## 2018-03-01 NOTE — Progress Notes (Signed)
Review of records  March 2015 CT abdomen pelvis without contrast (due to iodine allergy) Asymmetrical thickening of the right wall bladder.  A bladder mass/bladder carcinoma cannot be excluded. GI tract shows a nondistended global thickening of the gastric wall mucosa with a greater curve thickness about 17.7 mm.  Small bowel mildly thickened loops of jejunum.  No obstruction.  Right colon is outlined by contrast.  Appendix not identified.  Oral contrast reaches the right colon.  Stool-filled left colon.  No acute diverticulitis.  Rectal wall 9.9 mm without free air or abscess.  Calcification anterior to the right psoas.  No mesenteric lymph nodes or retroperitoneal lymph nodes.  June March 2002 colonoscopy (only a handwritten report) Polyps, no diverticula with plan for a 3 to 5-year follow-up colonoscopy.  Pathology consistent with small reactive lymphoid aggregate.  December 2010 upper endoscopy (only a handwritten report) Findings gastritis Pathology consistent with antral mucosa without pathologic findings and negative for H. pylori or intestinal metaplasia.  February 2015 upper endoscopy (only a handwritten report) Mild NERD, mild gastritis, benign polyps Pathology consistent with normal duodenal biopsies with preserved villous architecture and no increased intraepithelial lymphocytosis.  Gastric biopsies with minimal chronic inflammation and negative for H. pylori and intestinal metaplasia.  Gastric polyp suggestive of fundic gland polyp.  GE junction showing mild chronic inflammation and intestinal metaplasia consistent with Barrett's esophagus.  October 2016 upper endoscopy Normal hypopharynx. Z line regular at 39. Nonsevere reflux esophagitis biopsies performed to rule out Barrett's. A few gastric polyps biopsied. Few papules in the stomach biopsied. Normal mucosa and body biopsied. Normal ampulla and examined duodenum biopsy. Pathology consistent with: Normal duodenum.  With mild  chronic antral gastritis negative for H. pylori.  Gastric biopsy consistent with mild chronic gastritis negative for H. pylori and intestinal metaplasia.  Gastric polyps consistent with fundic gland polyp.  GE junction biopsy consistent with focal intestinal metaplasia suggestive of Barrett's.   At this point based on the results that we have obtained she does have a diagnosis of short segment Barrett's esophagus.  Problem list will be updated.  She will be due for an upper endoscopy this year to document no dysplasia and then at which point she can go to 3 to 5-year follow-up.  In none of her reports that she noted to have a hiatal hernia.  I think it would be reasonable to do a barium swallow prior to an upper endoscopy to evaluate for evidence of hiatal hernia.  She is also due for colonoscopy as no other colonoscopies were noted since 2002.  For now we will plan to proceed with scheduling a barium swallow and then an endoscopy and colonoscopy because of these findings and results from outside records. I will have my staff reach out to patient to alert her as to reasoning for procedures in the coming weeks.  [name], MD [facility] Office # [phone]

## 2018-03-03 ENCOUNTER — Other Ambulatory Visit: Payer: Self-pay

## 2018-03-03 DIAGNOSIS — Z1211 Encounter for screening for malignant neoplasm of colon: Secondary | ICD-10-CM

## 2018-03-03 DIAGNOSIS — R140 Abdominal distension (gaseous): Secondary | ICD-10-CM

## 2018-03-03 DIAGNOSIS — K2270 Barrett's esophagus without dysplasia: Secondary | ICD-10-CM

## 2018-03-03 DIAGNOSIS — K3189 Other diseases of stomach and duodenum: Secondary | ICD-10-CM

## 2018-03-03 DIAGNOSIS — K2990 Gastroduodenitis, unspecified, without bleeding: Secondary | ICD-10-CM

## 2018-03-03 DIAGNOSIS — K2970 Gastritis, unspecified, without bleeding: Secondary | ICD-10-CM

## 2018-03-03 DIAGNOSIS — R109 Unspecified abdominal pain: Secondary | ICD-10-CM

## 2018-03-03 NOTE — Progress Notes (Signed)
The patient has been notified of this information and all questions answered. The pt has been advised of the information and verbalized understanding.    

## 2018-03-03 NOTE — Progress Notes (Signed)
You have been scheduled for a Barium Esophogram at [facility] (1st floor of the hospital) on 03/16/18 at 1030 am. Please arrive 15 minutes prior to your appointment for registration. Make certain not to have anything to eat or drink 3 hours prior to your test. If you need to reschedule for any reason, please contact radiology at [phone] to do so. __________________________________________________________________ A barium swallow is an examination that concentrates on views of the esophagus. This tends to be a double contrast exam (barium and two liquids which, when combined, create a gas to distend the wall of the oesophagus) or single contrast (non-ionic iodine based). The study is usually tailored to your symptoms so a good history is essential. Attention is paid during the study to the form, structure and configuration of the esophagus, looking for functional disorders (such as aspiration, dysphagia, achalasia, motility and reflux) EXAMINATION You may be asked to change into a gown, depending on the type of swallow being performed. A radiologist and radiographer will perform the procedure. The radiologist will advise you of the type of contrast selected for your procedure and direct you during the exam. You will be asked to stand, sit or lie in several different positions and to hold a small amount of fluid in your mouth before being asked to swallow while the imaging is performed .In some instances you may be asked to swallow barium coated marshmallows to assess the motility of a solid food bolus. The exam can be recorded as a digital or video fluoroscopy procedure. POST PROCEDURE It will take 1-2 days for the barium to pass through your system. To facilitate this, it is important, unless otherwise directed, to increase your fluids for the next 24-48hrs and to resume your normal diet.  This test typically takes about 30 minutes to  perform. __________________________________________________________________________________

## 2018-03-11 ENCOUNTER — Telehealth: Payer: Self-pay | Admitting: Gastroenterology

## 2018-03-11 NOTE — Telephone Encounter (Signed)
The pt was concerned about the BE scheduled for 9/30.  She thought it was an endoscopy and was nervous

## 2018-03-11 NOTE — Telephone Encounter (Signed)
Pt is scheduled for a test on 03/16/18 and has to be npo for 3 hours. She has some questions about it. Pls call her.

## 2018-03-16 ENCOUNTER — Ambulatory Visit (HOSPITAL_COMMUNITY)
Admission: RE | Admit: 2018-03-16 | Discharge: 2018-03-16 | Disposition: A | Discharge status: Home / Self Care | Payer: BLUE CROSS/BLUE SHIELD | Source: Ambulatory Visit | Attending: Gastroenterology | Admitting: Gastroenterology

## 2018-03-16 DIAGNOSIS — K2270 Barrett's esophagus without dysplasia: Secondary | ICD-10-CM

## 2018-03-16 DIAGNOSIS — K3189 Other diseases of stomach and duodenum: Secondary | ICD-10-CM | POA: Diagnosis not present

## 2018-03-16 DIAGNOSIS — K2970 Gastritis, unspecified, without bleeding: Secondary | ICD-10-CM

## 2018-03-16 DIAGNOSIS — R140 Abdominal distension (gaseous): Secondary | ICD-10-CM

## 2018-03-16 DIAGNOSIS — R109 Unspecified abdominal pain: Secondary | ICD-10-CM | POA: Diagnosis not present

## 2018-03-16 DIAGNOSIS — K2990 Gastroduodenitis, unspecified, without bleeding: Secondary | ICD-10-CM | POA: Insufficient documentation

## 2018-03-16 DIAGNOSIS — Z1211 Encounter for screening for malignant neoplasm of colon: Secondary | ICD-10-CM | POA: Diagnosis not present

## 2018-03-16 DIAGNOSIS — R1310 Dysphagia, unspecified: Secondary | ICD-10-CM | POA: Diagnosis not present

## 2018-03-17 ENCOUNTER — Encounter: Payer: Self-pay | Admitting: Gastroenterology

## 2018-03-17 ENCOUNTER — Ambulatory Visit: Payer: Medicare Other | Admitting: Gastroenterology

## 2018-03-17 VITALS — BP 132/86 | HR 80 | Ht 63.0 in | Wt 144.0 lb

## 2018-03-17 DIAGNOSIS — R140 Abdominal distension (gaseous): Secondary | ICD-10-CM

## 2018-03-17 DIAGNOSIS — K5909 Other constipation: Secondary | ICD-10-CM

## 2018-03-17 DIAGNOSIS — R1013 Epigastric pain: Secondary | ICD-10-CM | POA: Diagnosis not present

## 2018-03-17 DIAGNOSIS — K2270 Barrett's esophagus without dysplasia: Secondary | ICD-10-CM

## 2018-03-17 NOTE — Patient Instructions (Addendum)
BARRETT'S ESOPHAGUS OVERVIEW-The esophagus is the tube that connects the mouth with the stomach (figure 1). Barrett's esophagus occurs when the normal cells that line the lower part of the esophagus (called squamous cells) are replaced by a different cell type (called intestinal cells). This process usually occurs as a result of repetitive damage to the inside of the esophagus caused by longstanding gastroesophageal reflux disease (GERD). In people with GERD, the esophagus is repeatedly exposed to excessive amounts of stomach acid and bile. Interestingly, the intestinal cells of Barrett's esophagus are more resistant to acid and bile than squamous cells, suggesting that these cells may develop to protect the esophagus from acid exposure. The problem is that the intestinal cells have a risk of transforming into cancer cells. More detailed information about Barrett's esophagus is available separately. (See "Barrett's esophagus: Epidemiology, clinical manifestations, and diagnosis" and "Barrett's esophagus: Surveillance and management".) BARRETT'S ESOPHAGUS RISK FACTORS-There are a number of factors that increase the risk of developing Barrett's esophagus: Age-Barrett's esophagus is most commonly diagnosed in middle-aged and older adults; the average age at diagnosis is approximately 55 years. Children can develop Barrett's esophagus, but rarely before the age of five years. Gender-Men are more commonly diagnosed with Barrett's esophagus than women. Ethnic background-Barrett's esophagus is most common in white populations, less common in Hispanic populations, and uncommon in Asian and black populations. Obesity-Barrett's esophagus is especially associated with the central type of obesity in which fat accumulates predominantly in the abdomen. Lifestyle-Smokers are more commonly diagnosed with Barrett's esophagus than nonsmokers. Family history-A history of Barrett's esophagus or adenocarcinoma of  the esophagus in a first-degree relative increases one's risk of having Barrett's esophagus. BARRETT'S ESOPHAGUS SYMPTOMS-Barrett's esophagus itself produces no symptoms. Instead, most people seek help because of symptoms of GERD, including heartburn, regurgitation of stomach contents, and, less commonly, difficulty swallowing. BARRETT'S ESOPHAGUS DIAGNOSIS-A healthcare provider may suspect Barrett's esophagus based upon a person's symptoms and the risk factors described above. An endoscopy is needed to confirm the abnormal esophageal lining. Upper endoscopy-Upper endoscopy is a test that allows your doctor to see the inside of the esophagus and stomach. Before the test, you are sedated to prevent discomfort. The doctor will insert a thin lighted tube into the esophagus. The tube has a camera, which allows the doctor to see the lining of the esophagus. Normally, the lining should appear pale and glossy; in a person with Barrett's esophagus, the lining appears pink or red and velvety. If the endoscopic appearance suggests Barrett's esophagus, the doctor will remove a small sample of the lining (a biopsy) during the endoscopy so that it can be examined with a microscope for signs of Barrett's. (See "Patient education: Upper endoscopy (Beyond the Basics)".) Endoscopy detects most (>80 percent) but not all cases of Barrett's esophagus. Individual variations in the anatomy of the esophagus and the area where it meets the stomach can make the diagnosis of Barrett's esophagus difficult in some people. BARRETT'S ESOPHAGUS TREATMENT-The goals of treatment in patients with Barrett's esophagus are to control reflux symptoms and to prevent the Barrett's from turning into cancer. Aggressive reflux treatment may be more effective in preventing cancer than treating only when there are reflux symptoms. (See "Barrett's esophagus: Surveillance and management".) Behavior and diet changes-The first priority in treating  Barrett's esophagus is to stop the damage to the esophageal lining, which usually means eliminating acid reflux. Most patients are advised to avoid certain foods and behaviors that increase the risk of reflux. Foods that can worsen reflux include: ?  Chocolate ?Coffee and tea ?Peppermint ?Alcohol ?Fatty foods Acidic juices such as orange or tomato juice may also worsen symptoms. Carbonated beverages can be a problem for some people. (See "Patient education: Acid reflux (gastroesophageal reflux disease) in adults (Beyond the Basics)".) Behaviors that can worsen reflux include eating meals just before going to bed, lying down soon after eating meals, and eating very large meals. Placing bricks or blocks under the head of the bed (to raise it by about six inches) can help to keep acid in the stomach while sleeping. It is not helpful to use additional pillows under the head. Medications-A clinician may prescribe medications that reduce the amount of acid produced by the stomach. A class of medications called proton pump inhibitors is commonly recommended. Six different formulations (some of which are available as a generic) are currently available: omeprazole (Prilosec), esomeprazole (Nexium), lansoprazole (Prevacid), dexlansoprazole (Dexilant), rabeprazole (Aciphex) and pantoprazole (Protonix); any of these is an acceptable option. Surgery-Some people with GERD may benefit from surgical procedures to reduce reflux. Surgery is not the best treatment in all situations, so you should discuss this option with your doctor. More information about surgical treatments for reflux is available in a separate topic review. (See "Patient education: Acid reflux (gastroesophageal reflux disease) in adults (Beyond the Basics)".) BARRETT'S ESOPHAGUS COMPLICATIONS-One potential complication of Barrett's esophagus is that, over time, the abnormal esophageal lining can develop early precancerous changes. The early changes  may progress to advanced precancerous changes, and finally to frank esophageal cancer. If undetected, this cancer can spread and invade surrounding tissues. However, progression to cancer is uncommon for any individual patient; studies that follow patients with Barrett's esophagus reveal that fewer than 0.5 percent of patients develop esophageal cancer per year. Furthermore, patients with Barrett's esophagus appear to live approximately as long as people who are free of this condition. Patients often die of other causes before Barrett's esophagus progresses to cancer. BARRETT'S ESOPHAGUS MONITORING-Monitoring for precancerous changes is recommended for most patients with Barrett's esophagus. At this time, monitoring includes periodic endoscopy with tissue biopsy. (See "Patient education: Upper endoscopy (Beyond the Basics)".) Although new technologies for monitoring are on the horizon, most are still considered to be experimental. Experts do not agree about the usefulness of monitoring. The benefits of monitoring depend upon each person's chance of developing esophageal cancer, which may be difficult to determine. Benefits-Reasons to perform endoscopic monitoring include: ?Monitoring can detect curable, precancerous changes (dysplasia) in the esophageal lining. These changes may indicate that the person has an increased risk of cancer. Early detection may be especially important for younger patients. Precancerous changes in Barrett's esophagus usually are treated with endoscopic procedures, rather than with the surgery, radiation and chemotherapy needed to treat invasive cancers. (See 'Endoscopic eradication therapy' below.) ?Monitoring may detect cancer at an earlier stage, when it can be more effectively treated. Limitations-However, not all patients will benefit from endoscopic monitoring. ?Progression of Barrett's esophagus to cancer is uncommon. ?Endoscopy carries certain risks and often causes  anxiety. ?Endoscopy may miss areas with premalignant changes or cancer. ?Even if endoscopy detects a cancer, the available treatment options may have unacceptably high risks. ?There is no proof that endoscopic monitoring prolongs life or prevents death from cancer of the esophagus. PRECANCEROUS CHANGES AND BARRETT'S ESOPHAGUS Confirmation and staging-If precancerous changes are discovered, they should be confirmed by a second pathologist, an expert in examining tissue samples. It is sometimes difficult to correctly identify precancerous changes, especially when there is inflammation (usually caused by the ongoing  reflux of acid). Many clinicians increase the dose of acid-suppressing medications in this situation. The precancerous changes must then be graded as "low-grade dysplasia", "high-grade dysplasia," or "intramucosal carcinoma" depending upon their severity. Treatment options-The management of low-grade dysplasia is controversial. Some physicians recommend frequent endoscopic surveillance for patients with low-grade dysplasia, while others recommend destroying the abnormal tissue with endoscopic eradication therapy (see below). Either option is acceptable, but recent studies suggest that endoscopic eradication therapy might be preferable. Endoscopic eradication therapy generally is recommended for the treatment of high-grade dysplasia or intramucosal carcinoma in Barrett's esophagus. Endoscopic eradication therapy-Endoscopic eradication therapy can be used to treat all grades of dysplasia in Barrett's esophagus. Endoscopic eradication therapy usually involves a combination of endoscopic mucosal resection to remove any worrisome areas identified by the endoscopists and endoscopic ablation of the remaining Barrett's esophagus, usually with radiofrequency ablation. Endoscopic mucosal resection-Endoscopic mucosal resection (EMR) involves the removal of a large but thin area of esophageal tissue  through an endoscope. EMR provides large tissue specimens that can be examined by the pathologist to determine the character and extent of the abnormality and determine if an adequate amount of tissue was removed. Therefore, it can help to confirm the person's diagnosis and can completely treat the abnormality (if the abnormal tissue is removed completely). However, this technique is generally performed only in specialized centers. Typically, EMR is performed if the endoscopist sees a worrisome area of nodularity in the Barrett's esophagus. EMR is commonly followed by ablation of the remaining Barrett's esophagus, usually with radiofrequency ablation. (See 'Radiofrequency ablation' below.) Radiofrequency ablation-Radiofrequency ablation (RFA) is an endoscopic procedure that uses radiofrequency energy (microwaves) to destroy the Barrett's cells. In short-term studies, RFA has been shown to prevent high-grade dysplasia from progressing to cancer and to prevent low-grade dysplasia from developing more advanced features. However, there is limited information on the long-term outcome of this approach. In approximately 5 percent of patients, the procedure causes a complication, such as narrowing of the esophagus, which may require repeated treatments to open the esophagus. Another concern with RFA is that, in a small minority of patients with high-grade dysplasia or intramucosal carcinoma (less than 2 percent), there may be cancer in the lymph nodes adjacent to the esophagus. RFA cannot cure cancer in the lymph nodes. In all cases, the patient and family should discuss the risks and benefits of possible treatments with a healthcare provider. Finally, Barrett's esophagus can recur even after it has been completely eradicated by RFA. Therefore, RFA does not eliminate the need for endoscopic monitoring. SUMMARY-Despite the uncertainties surrounding the monitoring and treatment of Barrett's esophagus, there is  consensus on one matter: The available options should be tailored to the individual patient. The following are general guidelines: ?People with Barrett's esophagus should be treated with a proton pump inhibitor. This may improve or eliminate symptoms of heartburn, reduce inflammation, help prevent complications, and improve the accuracy of endoscopy evaluation. ?People without evidence of precancerous changes (ie, no dysplasia) or esophageal cancer should have endoscopy performed every three to five years to look for the development of precancerous changes, unless there are other medical conditions that increase the small risks usually associated with endoscopy. ?If endoscopy reveals a precancerous change (dysplasia), this finding should be confirmed by at least one expert; if necessary, additional tissue samples should be collected to resolve any doubt. ?People with early precancerous changes (low-grade dysplasia) often are treated with endoscopic eradication therapy. Another acceptable option is to have repeat endoscopy at 6 and 12 months,  followed by annual endoscopy if the lesion does not appear to progress.  ?People with confirmed, advanced precancerous changes (high-grade dysplasia or intramucosal carcinoma) should have endoscopic eradication therapy.  We have scheduled you for a follow up appointment with Dr Rush Landmark on 04/23/18 at 11am  Thank you for entrusting me with your care and choosing [facility].  Dr Rush Landmark

## 2018-03-18 ENCOUNTER — Ambulatory Visit: Payer: BLUE CROSS/BLUE SHIELD | Admitting: Gastroenterology

## 2018-03-18 NOTE — Progress Notes (Signed)
[place] VISIT   Primary Care Provider Briscoe Deutscher, [place] [address] [phone]  Patient Profile: Terri Barajas is a 72 y.o. female with a pmh significant for HTN, Glaucoma, MVP, GERD, reported Hiatal Hernia.  The patient presents to the [facility] for an evaluation and management of problem(s) noted below:  Problem List 1. Barrett's esophagus without dysplasia   2. Bloating   3. Epigastric pain   4. Other constipation     History of Present Illness: This is a patient who is carried a diagnosis of "GERD" for years and was previously treated with Dexilant 60 mg daily for at least 4 years.  Previously had a history of pyrosis and heartburn and had undergone multiple endoscopies which showed acid related changes and acid reflux.  After obtaining records from [place] it looks like the patient has had Barrett's esophagus with non-dysplasia and her last endoscopy was in 2016.  She has had issues of chest pain chest discomfort postprandially especially when she bends over and previously has been described as having a hiatal hernia.  However based on the records that we obtained from [place] is not clear that a documented hiatal hernia has been seen on any imaging or on esophagus evaluation via EGD.  The discomfort does not occur every time she eats but mostly when she bends over she feels a sensation of fullness and tightness in the midepigastrium and subxiphoid region.  Bloating and abdominal distention were new symptoms and signs occurred after she moved to [place].  She had trialed Gas-X for this in the past without significant relief.  She describes having had a prior colonoscopy in 2015 2016 although no records were obtained as of yet the patient feels the latter is more likely.  (We will work on trying to obtain these).  W she has followed lifestyle modifications for her acid reflux but does not own  a bed wedge as of yet.  She has a shellfish allergy and has never had IV contrast.  She has bowel movements every 1 to 3 days and does not have to strain.  The past she had used Metamucil as well as MiraLAX but only for short periods and time.  On last encounter we discussed initiating fiber supplementation with FiberCon as well as using MiraLAX on a daily basis to try and help keep her stools soft.    Interval History: Since initiating the FiberCon supplementation the patient has been doing slightly better in regards to her bloating.  She still does describe issues of wanting to eat ice cream but developing bloating.  She is previously tried lactose-free milk as well as almond milk and silk without significant improvements.  She is currently taking skim milk with some ability to tolerate that.  She underwent a barium esophagram which did not show overt evidence of a hiatal hernia.  As noted in previous documentation does seem that she has Barrett's esophagus but she was not clear about knowing this diagnosis.  GI Review of Systems Positive as above including chest pressure after bending over and having a large meal Negative for odynophagia, globus, vomiting, change in bowel movement frequency  Review of Systems General: Denies fevers/chills/weight loss Cardiovascular: Denies current chest pain/palpitations Pulmonary: Denies shortness of breath Gastroenterological: See HPI Genitourinary: Denies dark and urine Hematological: Denies easy bruising/bleeding Psychological: Mood is stable and excited about her next grandchild being born soon Musculoskeletal: Denies new arthralgias   Medications Current Outpatient Medications  Medication Sig Dispense Refill  . ALPRAZolam (XANAX) 0.5 MG tablet Take 0.25 mg by mouth at bedtime as needed for anxiety.     Marland Kitchen aspirin EC 81 MG tablet Take 81 mg by mouth daily.    . Biotin 10000 MCG TABS Take 1 tablet by mouth daily.     . bisacodyl (DULCOLAX) 5 MG EC  tablet Take 5 mg by mouth as needed for moderate constipation.    Marland Kitchen dexlansoprazole (DEXILANT) 60 MG capsule Take 60 mg by mouth daily.    . hydrochlorothiazide (HYDRODIURIL) 25 MG tablet Take 25 mg by mouth every other day.     . latanoprost (XALATAN) 0.005 % ophthalmic solution Place 1 drop into both eyes at bedtime.     . nadolol (CORGARD) 20 MG tablet Take 20 mg by mouth daily.     No current facility-administered medications for this visit.    Allergies Allergies  Allergen Reactions  . Atracurium & Derivatives   . Atropine   . Iodine   . Shellfish Allergy    Histories Past Medical History:  Diagnosis Date  . Anxiety   . GERD (gastroesophageal reflux disease)   . Glaucoma   . Heart disease   . Hypertension   . Panic attacks    Past Surgical History:  Procedure Laterality Date  . APPENDECTOMY  1972  . [place]  . HAND SURGERY Bilateral   . HEMORRHOID SURGERY  2018  . S/P Hysterectomy   1987   Social History   Socioeconomic History  . Marital status: Married    Spouse name: Not on file  . Number of children: Not on file  . Years of education: 66  . Highest education level: Not on file  Occupational History  . Not on file  Social Needs  . Financial resource strain: Not on file  . Food insecurity:    Worry: Not on file    Inability: Not on file  . Transportation needs:    Medical: Not on file    Non-medical: Not on file  Tobacco Use  . Smoking status: Never Smoker  . Smokeless tobacco: Never Used  Substance and Sexual Activity  . Alcohol use: Not Currently  . Drug use: Not Currently  . Sexual activity: Yes    Partners: Male  Lifestyle  . Physical activity:    Days per week: Not on file    Minutes per session: Not on file  . Stress: Not on file  Relationships  . Social connections:    Talks on phone: Not on file    Gets together: Not on file    Attends religious service: Not on file    Active member of club or organization:  Not on file    Attends meetings of clubs or organizations: Not on file    Relationship status: Not on file  . Intimate partner violence:    Fear of current or ex partner: Not on file    Emotionally abused: Not on file    Physically abused: Not on file    Forced sexual activity: Not on file  Other Topics Concern  . Not on file  Social History Narrative  . Not on file   Family History  Problem Relation Age of Onset  . Pancreatic cancer Mother   . Alcohol abuse Father   . Asthma Sister   . Hypertension Sister   . Arthritis Sister   . Diabetes Brother   . Arthritis Brother   .  Hyperlipidemia Brother   . Hypertension Brother   . Glaucoma Brother   . Colon polyps Brother   . Arthritis Sister   . Arthritis Sister   . Prostate cancer Brother   . Colon cancer Neg Hx   . Esophageal cancer Neg Hx   . Inflammatory bowel disease Neg Hx   . Liver disease Neg Hx   . Rectal cancer Neg Hx   . Stomach cancer Neg Hx    I have reviewed her medical, social, and family history in detail and updated the electronic medical record as necessary.    PHYSICAL EXAMINATION  BP 132/86   Pulse 80   Ht 5\' 3"  (1.6 m)   Wt 144 lb (65.3 kg)   BMI 25.51 kg/m  GEN: NAD, appears younger than stated age, doesn't appear chronically ill PSYCH: Cooperative, without pressured speech EYE: Conjunctivae pink, sclerae anicteric ENT: MMM, without oral ulcers NECK: Supple CV: RR without R/Gs  RESP: CTAB posteriorly GI: NABS, soft, nontender/nondistended, without rebound or guarding, no HSM appreciated MSK/EXT: No lower extremity edema SKIN: No jaundice  NEURO:  Alert & Oriented x 3, no focal deficits   REVIEW OF DATA  I reviewed the following data at the time of this encounter:  GI Procedures and Studies  No relevant studies  Laboratory Studies  Reviewed in EPIC  Imaging Studies  September 2019 esophagram IMPRESSION: Normal double contrast barium esophagram.   ASSESSMENT  Terri Barajas is a  72 y.o. female with a pmh significant for HTN, Glaucoma, MVP, GERD, reported Hiatal Hernia.  The patient is seen today for a return visit for evaluation and management of:  1. Barrett's esophagus without dysplasia   2. Bloating   3. Epigastric pain   4. Other constipation    The patient seems to be stable to doing well overall with her current regimen of acid suppression as well as fiber supplementation with use of laxatives.  She is actually asking whether she can decrease her laxative use to every other day because there are periods where she does have looser stools and I think that is reasonable.  Her bloating and distention has been somewhat better while on her current medications.  She has been keeping a diary of foods and wonders about whether this improvement right now we will continue for the upcoming future.  We had previously discussed consideration of a bacterial overgrowth evaluation but for now we will hold off on that.  We did discuss her diagnosis of Barrett's esophagus which she previously was not clear why she was having endoscopy so frequently.  Her pathology has shown intestinal metaplasia on her to most recent endoscopies in 2015/2016.  As such even though she has non-dysplasia she is due for a follow-up endoscopy at this time and we will plan on pursuing this later this year or early next year.  Her previously described issue of having a hiatal hernia has not been clearly delineated on her endoscopy reports or on recent barium esophagram.  I think we will try to clarify this further based on her endoscopy in case she has a small sliding hiatal hernia.  I do think that if she continues to have issues of acid reflux breaking through with findings of a small hiatal hernia she may benefit from Gaviscon.  She is going back up [place] for the next few weeks to be with her son and daughter-in-law as they are having a child and so she will be away for a few weeks  and will return on follow-up and  will determine whether any further testing will be indicated.   PLAN  1. Barrett's esophagus without dysplasia - Due for Surveillance will plan later this year (patient wants to be seen in clinic when she returns prior to scheduling) - Continue PPI current dosing  2. Bloating - Workup for possible HH ongoing though recent Esophagram did not make mention - Consideration of SIBO testing in future if worsens  3. Epigastric pain - Not a significant issue today - Will consider role of contrasted CTAP in future (though would need to be with steroid preparation)  4. Other constipation - Continue Fibercon OTC - Continue Miralax QD-QOD dependent on stool) - May consider role of Amitiza or Linzess if remains a significant issue   No orders of the defined types were placed in this encounter.   New Prescriptions   No medications on file   Modified Medications   No medications on file    Planned Follow Up: No follow-ups on file.   [name], MD [facility] Advanced [facility]oscopy Office # [phone]

## 2018-03-19 ENCOUNTER — Encounter: Payer: Self-pay | Admitting: Gastroenterology

## 2018-03-24 ENCOUNTER — Encounter: Payer: Self-pay | Admitting: Gastroenterology

## 2018-03-24 NOTE — Progress Notes (Signed)
Review of outside records from [place]  October 2016 clinic visit assessment suggestive of Barrett's esophagus without dysplasia and need to continue Dexilant.  October 2016 upper endoscopy Hypopharynx normal Z line regular at 39 cm Nonsevere esophagitis at the GE junction biopsies obtained Few small sessile polyps in the gastric body status post forcep histology Few small sized papules/nodules in the gastric antrum with biopsies taken for forceps Cardia gastric fundus were normal Ampulla and duodenum were normal and visualized portions Biopsies obtained for celiac disease evaluation  Pathology results Duodenal biopsies negative for celiac disease and no increased intraepithelial lymphocytes Antral nodularity biopsies show mild chronic gastritis negative for H. pylori or fungal stains Gastric biopsies of the antrum and body show mild chronic gastritis with no intestinal metaplasia no H. pylori Gastric polyp in the fundus returned as fundic gland polyp no intestinal metaplasia no H. pylori GE junction biopsies showed moderate chronic inflammation and focal intestinal metaplasia consistent with Barrett's esophagus A rectal polyp was removed and that was normal colonic mucosa not hyperplastic or adenoma   Based on these findings the patient's next colonoscopy without a history of prior adenomatous polyps will not be until 2026.  [name], MD [facility] Advanced [facility]oscopy Office # [phone]

## 2018-04-17 ENCOUNTER — Ambulatory Visit: Payer: Medicare Other | Admitting: Family Medicine

## 2018-04-19 NOTE — Progress Notes (Deleted)
Terri Barajas is a 72 y.o. female is here for follow up.  History of Present Illness:   HPI: See Assessment and Plan section for Problem Based Charting of issues discussed today.   Health Maintenance Due  Topic Date Due  . Hepatitis C Screening  03-24-1946  . TETANUS/TDAP  10/21/1966  . MAMMOGRAM  10/20/1997  . COLONOSCOPY  10/20/1997  . DEXA SCAN  10/20/2012  . PNA vac Low Risk Adult (1 of 2 - PCV13) 10/20/2012  . INFLUENZA VACCINE  01/15/2018   Depression screen PHQ 2/9 01/14/2018  Decreased Interest 0  Down, Depressed, Hopeless 0  PHQ - 2 Score 0   PMHx, SurgHx, SocialHx, FamHx, Medications, and Allergies were reviewed in the Visit Navigator and updated as appropriate.   Patient Active Problem List   Diagnosis Date Noted  . Barrett's esophagus 03/01/2018  . Elevated lipase 02/18/2018  . Hiatal hernia 02/18/2018  . Abdominal pain 02/18/2018  . Constipation 02/18/2018  . Bloating 02/18/2018  . Aortic atherosclerosis ([place]) 01/23/2018  . GERD (gastroesophageal reflux disease)   . Glaucoma   . Hypertension   . Panic attacks    Social History   Tobacco Use  . Smoking status: Never Smoker  . Smokeless tobacco: Never Used  Substance Use Topics  . Alcohol use: Not Currently  . Drug use: Not Currently   Current Medications and Allergies:   Current Outpatient Medications:  .  ALPRAZolam (XANAX) 0.5 MG tablet, Take 0.25 mg by mouth at bedtime as needed for anxiety. , Disp: , Rfl:  .  aspirin EC 81 MG tablet, Take 81 mg by mouth daily., Disp: , Rfl:  .  Biotin 10000 MCG TABS, Take 1 tablet by mouth daily. , Disp: , Rfl:  .  bisacodyl (DULCOLAX) 5 MG EC tablet, Take 5 mg by mouth as needed for moderate constipation., Disp: , Rfl:  .  dexlansoprazole (DEXILANT) 60 MG capsule, Take 60 mg by mouth daily., Disp: , Rfl:  .  hydrochlorothiazide (HYDRODIURIL) 25 MG tablet, Take 25 mg by mouth every other day. , Disp: , Rfl:  .  latanoprost (XALATAN) 0.005 % ophthalmic  solution, Place 1 drop into both eyes at bedtime. , Disp: , Rfl:  .  nadolol (CORGARD) 20 MG tablet, Take 20 mg by mouth daily., Disp: , Rfl:   Allergies  Allergen Reactions  . Atracurium & Derivatives   . Atropine   . Iodine   . Shellfish Allergy    Review of Systems   Pertinent items are noted in the HPI. Otherwise, ROS is negative.  Vitals:  There were no vitals filed for this visit.   There is no height or weight on file to calculate BMI.  Physical Exam:   Physical Exam  Results for orders placed or performed in visit on 93/57/01  Helicobacter pylori special antigen  Result Value Ref Range   MICRO NUMBER: [id number]    SPECIMEN QUALITY ADEQUATE    SOURCE: STOOL    STATUS: FINAL    RESULT:      Not Detected  Antimicrobials, proton pump inhibitors, and bismuth preparations inhibit H. pylori and ingestion up to two weeks prior to testing may cause false negative results. If clinically indicated the test should be repeated on a new specimen  obtained two weeks after discontinuing treatment.   Amylase  Result Value Ref Range   Amylase 29 27 - 131 U/L  Hepatic function panel  Result Value Ref Range   Total Bilirubin 0.5 0.2 -  1.2 mg/dL   Bilirubin, Direct 0.0 0.0 - 0.3 mg/dL   Alkaline Phosphatase 70 39 - 117 U/L   AST 14 0 - 37 U/L   ALT 12 0 - 35 U/L   Total Protein 7.6 6.0 - 8.3 g/dL   Albumin 4.2 3.5 - 5.2 g/dL  Lipase  Result Value Ref Range   Lipase 101.0 (H) 11.0 - 59.0 U/L    Assessment and Plan:   No problem-specific Assessment & Plan notes found for this encounter.   No orders of the defined types were placed in this encounter.   No orders of the defined types were placed in this encounter.   . Reviewed expectations re: course of current medical issues. . Discussed self-management of symptoms. . Outlined signs and symptoms indicating need for more acute intervention. . Patient verbalized understanding and all questions were answered. Marland Kitchen Health  Maintenance issues including appropriate healthy diet, exercise, and smoking avoidance were discussed with patient. . See orders for this visit as documented in the electronic medical record. . Patient received an After Visit Summary.  Briscoe Deutscher, DO [place] 04/19/2018

## 2018-04-20 ENCOUNTER — Ambulatory Visit: Payer: Medicare Other | Admitting: Family Medicine

## 2018-04-23 ENCOUNTER — Encounter: Payer: Self-pay | Admitting: Gastroenterology

## 2018-04-23 ENCOUNTER — Encounter (INDEPENDENT_AMBULATORY_CARE_PROVIDER_SITE_OTHER): Payer: Self-pay

## 2018-04-23 ENCOUNTER — Ambulatory Visit: Payer: Medicare Other | Admitting: Gastroenterology

## 2018-04-23 VITALS — BP 102/70 | HR 62 | Ht 63.0 in | Wt 147.0 lb

## 2018-04-23 DIAGNOSIS — K2270 Barrett's esophagus without dysplasia: Secondary | ICD-10-CM

## 2018-04-23 DIAGNOSIS — T883XXS Malignant hyperthermia due to anesthesia, sequela: Secondary | ICD-10-CM

## 2018-04-23 DIAGNOSIS — T883XXA Malignant hyperthermia due to anesthesia, initial encounter: Secondary | ICD-10-CM | POA: Insufficient documentation

## 2018-04-23 DIAGNOSIS — K5909 Other constipation: Secondary | ICD-10-CM | POA: Diagnosis not present

## 2018-04-23 DIAGNOSIS — R140 Abdominal distension (gaseous): Secondary | ICD-10-CM | POA: Diagnosis not present

## 2018-04-23 NOTE — Patient Instructions (Signed)
Normal BMI (Body Mass Index- based on height and weight) is between 19 and 25. Your BMI today is Body mass index is 26.04 kg/m. Marland Kitchen Please consider follow up  regarding your BMI with your Primary Care Provider.  We will contact you mid December to schedule your [facility] for the first AM case.  Thank you for entrusting me with your care and choosing [facility] care.  Dr Rush Landmark

## 2018-04-23 NOTE — Progress Notes (Signed)
[place] VISIT   Primary Care Provider Briscoe Deutscher, [place] [id number] [phone]  Patient Profile: Terri Barajas is a 72 y.o. female with a pmh significant for HTN, Glaucoma, MVP, GERD, reported Hiatal Hernia.  The patient presents to the [facility] for an evaluation and management of problem(s) noted below:  Problem List 1. Barrett's esophagus without dysplasia   2. Malignant hyperthermia, sequela   3. Bloating   4. Other constipation     History of Present Illness: This is a patient who has carried a diagnosis of "GERD" for years and was previously treated with Dexilant 60 mg daily for at least 4 years.  Previously had a history of pyrosis and heartburn and had undergone multiple endoscopies which showed acid related changes and acid reflux.  After obtaining records from [place] it looks like the patient has had Barrett's esophagus with non-dysplasia and her last endoscopy was in 2016.  She has had issues of chest pain chest discomfort postprandially especially when she bends over and previously has been described as having a hiatal hernia.  However based on the records that we obtained from [place] is not clear that a documented hiatal hernia has been seen on any imaging or on esophagus evaluation via EGD.  The discomfort does not occur every time she eats but mostly when she bends over she feels a sensation of fullness and tightness in the midepigastrium and subxiphoid region.  Bloating and abdominal distention were new symptoms and signs occurred after she moved to [place].  She had trialed Gas-X for this in the past without significant relief.  She describes having had a prior colonoscopy in 2015 2016 although no records were obtained as of yet the patient feels the latter is more likely.  (We will work on trying to obtain these).  W she has followed lifestyle modifications for her acid reflux  but does not own a bed wedge as of yet.  She has a shellfish allergy and has never had IV contrast.  She has bowel movements every 1 to 3 days and does not have to strain.  The past she had used Metamucil as well as MiraLAX but only for short periods and time.  On last encounter we discussed initiating fiber supplementation with FiberCon as well as using MiraLAX on a daily basis to try and help keep her stools soft.  When we had seen her in follow-up the patient had been doing well on FiberCon supplementation in regards to her bloating.  She had also tried lactose-free milk as well as almond milk and silk without significant improvements but was able to tolerate skim milk.  She underwent a barium esophagram which did not show an overt hiatal hernia but as documented previously she had Barrett's and we described this entity as well as the need for follow-up as her last endoscopy was in 2016 without any dysplastic changes.  She is planning to go see her grandchild who was just recently born and was plan for follow-up in November or December.  Interval History: The patient returns for scheduled follow-up.  She continues to do well with her current regimen.  We were able to obtain her colonoscopy report and she is not due for colon cancer screening until 2026 based on her colonoscopy from 2016 and no prior history of adenomatous polyps per her report.  We discussed briefly the consideration of SIBO breath testing.  Weight has been stable.  No evidence  of any blood in the stools.  We did discuss her history from a family perspective of potential malignant hyperthermia and that all of her prior procedures have been done in the hospital in the setting of malignant hyperthermia evaluation.  She describes previously having a muscle biopsy performed but we do not have the records.  GI Review of Systems Positive as above Negative for odynophagia, vomiting, change in bowel movement frequency  Review of  Systems General: Denies fevers/chills/weight loss Cardiovascular: Denies current chest pain Pulmonary: Denies shortness of breath Gastroenterological: See HPI Genitourinary: Denies dark and urine Hematological: Denies easy bruising/bleeding Psychological: Mood is stable   Medications Current Outpatient Medications  Medication Sig Dispense Refill  . ALPRAZolam (XANAX) 0.5 MG tablet Take 0.25 mg by mouth at bedtime as needed for anxiety.     Marland Kitchen aspirin EC 81 MG tablet Take 81 mg by mouth daily.    . Biotin 10000 MCG TABS Take 1 tablet by mouth daily.     . bisacodyl (DULCOLAX) 5 MG EC tablet Take 5 mg by mouth as needed for moderate constipation.    Marland Kitchen dexlansoprazole (DEXILANT) 60 MG capsule Take 60 mg by mouth daily.    . hydrochlorothiazide (HYDRODIURIL) 25 MG tablet Take 25 mg by mouth every other day.     . latanoprost (XALATAN) 0.005 % ophthalmic solution Place 1 drop into both eyes at bedtime.     . nadolol (CORGARD) 20 MG tablet Take 20 mg by mouth daily.     No current facility-administered medications for this visit.    Allergies Allergies  Allergen Reactions  . Atracurium & Derivatives   . Atropine   . Iodine   . Shellfish Allergy    Histories Past Medical History:  Diagnosis Date  . Anxiety   . GERD (gastroesophageal reflux disease)   . Glaucoma   . Heart disease   . Hypertension   . Panic attacks    Past Surgical History:  Procedure Laterality Date  . APPENDECTOMY  1972  . [place]  . HAND SURGERY Bilateral   . HEMORRHOID SURGERY  2018  . S/P Hysterectomy   1987   Social History   Socioeconomic History  . Marital status: Married    Spouse name: Not on file  . Number of children: Not on file  . Years of education: 37  . Highest education level: Not on file  Occupational History  . Not on file  Social Needs  . Financial resource strain: Not on file  . Food insecurity:    Worry: Not on file    Inability: Not on file  .  Transportation needs:    Medical: Not on file    Non-medical: Not on file  Tobacco Use  . Smoking status: Never Smoker  . Smokeless tobacco: Never Used  Substance and Sexual Activity  . Alcohol use: Not Currently  . Drug use: Not Currently  . Sexual activity: Yes    Partners: Male  Lifestyle  . Physical activity:    Days per week: Not on file    Minutes per session: Not on file  . Stress: Not on file  Relationships  . Social connections:    Talks on phone: Not on file    Gets together: Not on file    Attends religious service: Not on file    Active member of club or organization: Not on file    Attends meetings of clubs or organizations: Not on file  Relationship status: Not on file  . Intimate partner violence:    Fear of current or ex partner: Not on file    Emotionally abused: Not on file    Physically abused: Not on file    Forced sexual activity: Not on file  Other Topics Concern  . Not on file  Social History Narrative  . Not on file   Family History  Problem Relation Age of Onset  . Pancreatic cancer Mother   . Alcohol abuse Father   . Asthma Sister   . Hypertension Sister   . Arthritis Sister   . Diabetes Brother   . Arthritis Brother   . Hyperlipidemia Brother   . Hypertension Brother   . Glaucoma Brother   . Colon polyps Brother   . Arthritis Sister   . Arthritis Sister   . Prostate cancer Brother   . Colon cancer Neg Hx   . Esophageal cancer Neg Hx   . Inflammatory bowel disease Neg Hx   . Liver disease Neg Hx   . Rectal cancer Neg Hx   . Stomach cancer Neg Hx    I have reviewed her medical, social, and family history in detail and updated the electronic medical record as necessary.    PHYSICAL EXAMINATION  BP 102/70   Pulse 62   Ht 5' 3"  (1.6 m)   Wt 147 lb (66.7 kg)   BMI 26.04 kg/m  GEN: NAD, appears younger than stated age, doesn't appear chronically ill PSYCH: Cooperative, without pressured speech EYE: Conjunctivae pink, sclerae  anicteric ENT: MMM, without oral ulcers NECK: Supple CV: RR without R/Gs  RESP: CTAB posteriorly GI: NABS, soft, nontender/nondistended, without rebound or guarding, no HSM appreciated MSK/EXT: No lower extremity edema SKIN: No jaundice  NEURO:  Alert & Oriented x 3, no focal deficits   REVIEW OF DATA  I reviewed the following data at the time of this encounter:  GI Procedures and Studies  No new studies to review they have been documented previously in the chart as outpatient documentation  Laboratory Studies  Reviewed in EPIC  Imaging Studies  No new studies   ASSESSMENT  Terri Barajas is a 72 y.o. female with a pmh significant for HTN, Glaucoma, MVP, GERD, reported Hiatal Hernia.  The patient is seen today for a return visit for evaluation and management of:  1. Barrett's esophagus without dysplasia   2. Malignant hyperthermia, sequela   3. Bloating   4. Other constipation    The patient is clinically and hemodynamically stable and seems to be doing well on her current dosing of fiber supplementation.  We did discuss in the setting of her bloating that seems to be stable to improved since we initially met her that small intestine bacterial overgrowth may be something reasonable to consider in the future for evaluation.  She would like to defer on this currently because of how she is feeling.  I think that is reasonable.  In 2019 she will be 3 years since her last endoscopic evaluation for history of Barrett's without dysplasia.  We discussed the role of surveillance and evaluation to ensure she has not developed low-grade or high-grade dysplasia.  She is not having significant dysphagia currently but has had the infrequent sensation when she leans forward of something in her chest but much less so than previously being noted.  She has a family history of malignant hyperthermia and reports always being done in the hospital setting because of this concern although she reports  a  muscle biopsy that was reportedly negative.  With this history I think is just reasonable for the patient to have a procedure in the hospital but we will try and obtain the results of her muscle biopsy to help [place] moving forward.  The risks and benefits of endoscopic evaluation were discussed with the patient; these include but are not limited to the risk of perforation, infection, bleeding, missed lesions, lack of diagnosis, severe illness requiring hospitalization, as well as anesthesia and sedation related illnesses.  The patient is agreeable to proceed.  All patient questions were answered, to the best of my ability, and the patient agrees to the aforementioned plan of action with follow-up as indicated.   PLAN  1. Barrett's esophagus without dysplasia - Plan for EGD for surveillance - Continue current PPI dosing  2. Malignant hyperthermia, sequela - Obtain records of previous muscle biopsy but for now plan for procedures at the hospital  3. Bloating - Hold on SIBO evaluation  4. Other constipation - Continue Fibercon OTC - Continue Miralax QD-QOD dependent on stool) - May consider role of Amitiza or Linzess if remains a significant issue   No orders of the defined types were placed in this encounter.   New Prescriptions   No medications on file   Modified Medications   No medications on file    Planned Follow Up: No follow-ups on file.   [name], MD [facility] Office # [phone]

## 2018-04-26 ENCOUNTER — Encounter: Payer: Self-pay | Admitting: Gastroenterology

## 2018-04-27 NOTE — Progress Notes (Signed)
Terri Barajas is a 72 y.o. female is here for follow up.  History of Present Illness:   Lonell Grandchild, CMA acting as scribe for Dr. Briscoe Deutscher.    HPI: Patient in office for follow up. She is taking all medications as prescribed. She has been having increased dry mouth in the morning. Snores. Not sure about apneic events.   Health Maintenance Due  Topic Date Due  . Hepatitis C Screening  1946-03-05  . TETANUS/TDAP  10/21/1966  . MAMMOGRAM  10/20/1997  . COLONOSCOPY  10/20/1997  . DEXA SCAN  10/20/2012  . PNA vac Low Risk Adult (1 of 2 - PCV13) 10/20/2012   Depression screen PHQ 2/9 01/14/2018  Decreased Interest 0  Down, Depressed, Hopeless 0  PHQ - 2 Score 0   PMHx, SurgHx, SocialHx, FamHx, Medications, and Allergies were reviewed in the Visit Navigator and updated as appropriate.   Patient Active Problem List   Diagnosis Date Noted  . Situational anxiety 04/29/2018  . MVP (mitral valve prolapse) 04/29/2018  . Sleep-disordered breathing 04/29/2018  . Family History of Hyperthermia, malignant 04/23/2018  . Barrett's esophagus 03/01/2018  . Elevated lipase 02/18/2018  . Hiatal hernia 02/18/2018  . Constipation 02/18/2018  . Aortic atherosclerosis ([place]) 01/23/2018  . GERD (gastroesophageal reflux disease)   . Glaucoma   . Hypertension    Social History   Tobacco Use  . Smoking status: Never Smoker  . Smokeless tobacco: Never Used  Substance Use Topics  . Alcohol use: Not Currently  . Drug use: Not Currently   Current Medications and Allergies:   .  ALPRAZolam (XANAX) 0.5 MG tablet, Take 0.25 mg by mouth at bedtime as needed for anxiety.  Marland Kitchen  aspirin EC 81 MG tablet, Take 81 mg by mouth daily., Disp: , Rfl:  .  Biotin 10000 MCG TABS, Take 1 tablet by mouth daily. , Disp: , Rfl:  .  bisacodyl (DULCOLAX) 5 MG EC tablet, Take 5 mg by mouth as needed for moderate constipation .  dexlansoprazole (DEXILANT) 60 MG capsule, Take 60 mg by mouth daily., Disp: ,  Rfl:  .  hydrochlorothiazide (HYDRODIURIL) 25 MG tablet, Take 25 mg by mouth every other day.  .  latanoprost (XALATAN) 0.005 % ophthalmic solution, Place 1 drop into both eyes at bedtime.  .  nadolol (CORGARD) 20 MG tablet, Take 20 mg by mouth daily., Disp: , Rfl:    Allergies  Allergen Reactions  . Atracurium & Derivatives   . Atropine   . Iodine   . Shellfish Allergy    Review of Systems   Pertinent items are noted in the HPI. Otherwise, ROS is negative.  Vitals:   Vitals:   04/29/18 0748  BP: 120/82  Pulse: 76  Temp: 97.7 F (36.5 C)  TempSrc: Oral  SpO2: 97%  Weight: 148 lb 3.2 oz (67.2 kg)  Height: 5\' 3"  (1.6 m)     Body mass index is 26.25 kg/m.  Physical Exam:   Physical Exam  Constitutional: She appears well-nourished.  HENT:  Head: Normocephalic and atraumatic.  Eyes: Pupils are equal, round, and reactive to light. EOM are normal.  Neck: Normal range of motion. Neck supple.  Cardiovascular: Normal rate, regular rhythm, normal heart sounds and intact distal pulses.  Pulmonary/Chest: Effort normal.  Abdominal: Soft.  Skin: Skin is warm.  Psychiatric: She has a normal mood and affect. Her behavior is normal.  Nursing note and vitals reviewed.  Assessment and Plan:   Sleep-disordered breathing Patent  has a several year history of symptoms of snoring and dry mouth in the morning. Patient generally gets adequatesleep per night, not always restful sleep. Snoring of moderate severity is present. Apneic episodes are unknown. Nasal obstruction is not present. Patient has not had tonsillectomy.  Plan: Snoring and apneic episodes. This is a new problem. This is suspicious for sleep apnea. Additional work-up is planned. I will refer the patient to a sleep specialist for further evaluation and management.   Situational anxiety Current symptoms: none. She denies current suicidal and homicidal ideation. She complains of the following side effects from the treatment:  none.   Plan: 1. Medications: Xanax, using sparingly. Discussed potential risks, expected benefits, possible side effects of the medicine. We also discussed how to take it correctly and dosing instructions.  2. Labs: no labs indicated at this time.  3. If she has any significant side effects to the medicine, she is to stop it and call for advice. Instructed patient to contact office or on-call physician promptly should condition worsen or any new symptoms appear.   MVP (mitral valve prolapse) Due to childhood Rheumatic fever. On BB. Cardiovascular ROS: no chest pain or dyspnea on exertion. Exercising regularly. Had Cardiologist that she visited every 6 months before moving. Will get her established.   Hypertension Review: taking medications as instructed, no medication side effects noted, no TIAs, no chest pain on exertion, no dyspnea on exertion, no swelling of ankles. Smoker: No..  Wt Readings from Last 3 Encounters:  04/29/18 148 lb 3.2 oz (67.2 kg)  04/23/18 147 lb (66.7 kg)  03/17/18 144 lb (65.3 kg)   BP Readings from Last 3 Encounters:  04/29/18 120/82  04/23/18 102/70  03/17/18 132/86   Lab Results  Component Value Date   CREATININE 0.85 01/14/2018   Plan: 1. Medication: no change. 2. Dietary sodium restriction. 3. Regular aerobic exercise. 4. Check blood pressures daily and record.  Orders Placed This Encounter  Procedures  . Flu vaccine HIGH DOSE PF  . Ambulatory referral to Cardiology  . Ambulatory referral to Sleep Studies   Meds ordered this encounter  Medications  . nadolol (CORGARD) 20 MG tablet    Sig: Take 1 tablet (20 mg total) by mouth daily.    Dispense:  90 tablet    Refill:  3   . Reviewed expectations re: course of current medical issues. . Discussed self-management of symptoms. . Outlined signs and symptoms indicating need for more acute intervention. . Patient verbalized understanding and all questions were answered. Marland Kitchen Health Maintenance  issues including appropriate healthy diet, exercise, and smoking avoidance were discussed with patient. . See orders for this visit as documented in the electronic medical record. . Patient received an After Visit Summary.  Briscoe Deutscher, DO [place], [facility] 04/30/2018

## 2018-04-29 ENCOUNTER — Encounter: Payer: Self-pay | Admitting: Family Medicine

## 2018-04-29 ENCOUNTER — Ambulatory Visit (INDEPENDENT_AMBULATORY_CARE_PROVIDER_SITE_OTHER): Payer: Medicare Other | Admitting: Family Medicine

## 2018-04-29 VITALS — BP 120/82 | HR 76 | Temp 97.7°F | Ht 63.0 in | Wt 148.2 lb

## 2018-04-29 DIAGNOSIS — Z23 Encounter for immunization: Secondary | ICD-10-CM | POA: Diagnosis not present

## 2018-04-29 DIAGNOSIS — K2270 Barrett's esophagus without dysplasia: Secondary | ICD-10-CM

## 2018-04-29 DIAGNOSIS — F418 Other specified anxiety disorders: Secondary | ICD-10-CM | POA: Diagnosis not present

## 2018-04-29 DIAGNOSIS — I341 Nonrheumatic mitral (valve) prolapse: Secondary | ICD-10-CM | POA: Diagnosis not present

## 2018-04-29 DIAGNOSIS — G4730 Sleep apnea, unspecified: Secondary | ICD-10-CM

## 2018-04-29 DIAGNOSIS — I10 Essential (primary) hypertension: Secondary | ICD-10-CM

## 2018-04-29 MED ORDER — NADOLOL 20 MG PO TABS: 20.0000 mg | ORAL_TABLET | Freq: Every day | ORAL | 3 refills | Status: DC

## 2018-04-30 NOTE — Assessment & Plan Note (Signed)
Review: taking medications as instructed, no medication side effects noted, no TIAs, no chest pain on exertion, no dyspnea on exertion, no swelling of ankles. Smoker: No..  Wt Readings from Last 3 Encounters:  04/29/18 148 lb 3.2 oz (67.2 kg)  04/23/18 147 lb (66.7 kg)  03/17/18 144 lb (65.3 kg)   BP Readings from Last 3 Encounters:  04/29/18 120/82  04/23/18 102/70  03/17/18 132/86   Lab Results  Component Value Date   CREATININE 0.85 01/14/2018   Plan: 1. Medication: no change. 2. Dietary sodium restriction. 3. Regular aerobic exercise. 4. Check blood pressures daily and record.

## 2018-04-30 NOTE — Assessment & Plan Note (Signed)
Patent has a several year history of symptoms of snoring and dry mouth in the morning. Patient generally gets adequatesleep per night, not always restful sleep. Snoring of moderate severity is present. Apneic episodes are unknown. Nasal obstruction is not present. Patient has not had tonsillectomy.  Plan: Snoring and apneic episodes. This is a new problem. This is suspicious for sleep apnea. Additional work-up is planned. I will refer the patient to a sleep specialist for further evaluation and management.

## 2018-04-30 NOTE — Assessment & Plan Note (Signed)
Current symptoms: none. She denies current suicidal and homicidal ideation. She complains of the following side effects from the treatment: none.   Plan: 1. Medications: Xanax, using sparingly. Discussed potential risks, expected benefits, possible side effects of the medicine. We also discussed how to take it correctly and dosing instructions.  2. Labs: no labs indicated at this time.  3. If she has any significant side effects to the medicine, she is to stop it and call for advice. Instructed patient to contact office or on-call physician promptly should condition worsen or any new symptoms appear.

## 2018-04-30 NOTE — Assessment & Plan Note (Signed)
Due to childhood Rheumatic fever. On BB. Cardiovascular ROS: no chest pain or dyspnea on exertion. Exercising regularly. Had Cardiologist that she visited every 6 months before moving. Will get her established.

## 2018-05-08 ENCOUNTER — Encounter: Payer: Self-pay | Admitting: Gastroenterology

## 2018-05-08 NOTE — Progress Notes (Signed)
Review of outside records obtained  Some of these have already been reviewed previously  October 2016 he Hypopharynx normal. Z line was regular and found at 39 cm from incisors. Nonerosive esophagitis was found at the GE junction.  Biopsies were taken for histology Few small sessile polyps were found in the gastric body. Few small sized papules/nodules in the gastric antrum biopsies were obtained for histology.  Normal mucosa found in the gastric body.  Apices were obtained 3.  Cardia gastric fundus were normal on retroflexion.  Ampulla and examined duodenum were normal.  Biopsies were obtained for celiac disease.  2010 EGD records are in the chart from previous notation.  We will scan into the chart.   [name], MD [facility] Office # [phone]

## 2018-05-11 ENCOUNTER — Encounter: Payer: Medicare Other | Admitting: Family Medicine

## 2018-05-12 ENCOUNTER — Other Ambulatory Visit: Payer: Self-pay | Admitting: Family Medicine

## 2018-05-12 DIAGNOSIS — I341 Nonrheumatic mitral (valve) prolapse: Secondary | ICD-10-CM

## 2018-05-12 NOTE — Telephone Encounter (Signed)
Copied from [place] [id number]. Topic: Quick Communication - Rx Refill/Question >> May 12, 2018  2:14 PM Burchel, Abbi R wrote: Medication: nadolol (CORGARD) 20 MG tablet  Preferred Pharmacy: [id number] - [place], [place] [address] Phone: [phone] Fax: [phone]    Pt was advised that RX refills may take up to 3 business days. We ask that you follow-up with your pharmacy.

## 2018-05-13 DIAGNOSIS — I341 Nonrheumatic mitral (valve) prolapse: Secondary | ICD-10-CM

## 2018-05-13 DIAGNOSIS — E785 Hyperlipidemia, unspecified: Secondary | ICD-10-CM | POA: Insufficient documentation

## 2018-05-21 ENCOUNTER — Other Ambulatory Visit: Payer: Self-pay

## 2018-05-21 ENCOUNTER — Telehealth: Payer: Self-pay

## 2018-05-21 DIAGNOSIS — T883XXS Malignant hyperthermia due to anesthesia, sequela: Secondary | ICD-10-CM

## 2018-05-21 DIAGNOSIS — K2270 Barrett's esophagus without dysplasia: Secondary | ICD-10-CM

## 2018-05-21 NOTE — Telephone Encounter (Signed)
-----   Message from Timothy Lasso, RN sent at 05/21/2018  2:45 PM EST ----- Regarding: FW: hospital EGD   ----- Message ----- From: Timothy Lasso, RN Sent: 05/22/2018 To: Timothy Lasso, RN Subject: FW: hospital EGD                                 ----- Message ----- From: Angie Fava, LPN Sent: 58/08/938  12:49 PM EST To: Timothy Lasso, RN Subject: FW: hospital EGD                               Dreshaun Stene, This patient has some strange hypothermic condition. Dr Rush Landmark said she needs to be the first case of the day ----- Message ----- From: Angie Fava, LPN Sent: 76/01/880 To: Angie Fava, LPN Subject: hospital EGD                                   [facility] EGD for January needs to be the first case of the day

## 2018-05-21 NOTE — Telephone Encounter (Signed)
EGD [facility] 07/06/18 Dr Mansouraty 730 am

## 2018-05-22 NOTE — Telephone Encounter (Signed)
EGD scheduled, pt instructed and medications reviewed.  Patient instructions mailed to home.  Patient to call with any questions or concerns.  

## 2018-05-27 ENCOUNTER — Encounter: Payer: Self-pay | Admitting: Family Medicine

## 2018-05-27 ENCOUNTER — Ambulatory Visit (INDEPENDENT_AMBULATORY_CARE_PROVIDER_SITE_OTHER): Payer: Medicare Other | Admitting: Family Medicine

## 2018-05-27 VITALS — BP 126/82 | HR 77 | Temp 97.8°F | Ht 63.0 in | Wt 149.2 lb

## 2018-05-27 DIAGNOSIS — Z9189 Other specified personal risk factors, not elsewhere classified: Secondary | ICD-10-CM | POA: Diagnosis not present

## 2018-05-27 DIAGNOSIS — E7800 Pure hypercholesterolemia, unspecified: Secondary | ICD-10-CM

## 2018-05-27 DIAGNOSIS — Z0000 Encounter for general adult medical examination without abnormal findings: Secondary | ICD-10-CM

## 2018-05-27 DIAGNOSIS — R5381 Other malaise: Secondary | ICD-10-CM

## 2018-05-27 DIAGNOSIS — R739 Hyperglycemia, unspecified: Secondary | ICD-10-CM | POA: Diagnosis not present

## 2018-05-27 DIAGNOSIS — Z800 Family history of malignant neoplasm of digestive organs: Secondary | ICD-10-CM | POA: Diagnosis not present

## 2018-05-27 DIAGNOSIS — N951 Menopausal and female climacteric states: Secondary | ICD-10-CM | POA: Diagnosis not present

## 2018-05-27 DIAGNOSIS — Z1159 Encounter for screening for other viral diseases: Secondary | ICD-10-CM

## 2018-05-27 DIAGNOSIS — R635 Abnormal weight gain: Secondary | ICD-10-CM | POA: Diagnosis not present

## 2018-05-27 DIAGNOSIS — R5383 Other fatigue: Secondary | ICD-10-CM | POA: Diagnosis not present

## 2018-05-27 DIAGNOSIS — I10 Essential (primary) hypertension: Secondary | ICD-10-CM

## 2018-05-27 LAB — LIPID PANEL
Cholesterol: 270 mg/dL — ABNORMAL HIGH (ref 0–200)
HDL: 54.7 mg/dL (ref >39.00)
LDL Cholesterol: 177 mg/dL — ABNORMAL HIGH (ref 0–99)
NonHDL: 215.49
Total CHOL/HDL Ratio: 5
Triglycerides: 191 mg/dL — ABNORMAL HIGH (ref 0.0–149.0)
VLDL: 38.2 mg/dL (ref 0.0–40.0)

## 2018-05-27 LAB — COMPREHENSIVE METABOLIC PANEL
ALT: 24 U/L (ref 0–35)
AST: 23 U/L (ref 0–37)
Albumin: 4.3 g/dL (ref 3.5–5.2)
Alkaline Phosphatase: 81 U/L (ref 39–117)
BUN: 25 mg/dL — ABNORMAL HIGH (ref 6–23)
CO2: 34 mEq/L — ABNORMAL HIGH (ref 19–32)
Calcium: 10.3 mg/dL (ref 8.4–10.5)
Chloride: 99 mEq/L (ref 96–112)
Creatinine, Ser: 0.89 mg/dL (ref 0.40–1.20)
GFR: 66.53 mL/min (ref >60.00)
Glucose, Bld: 149 mg/dL — ABNORMAL HIGH (ref 70–99)
Potassium: 3.7 mEq/L (ref 3.5–5.1)
Sodium: 139 mEq/L (ref 135–145)
Total Bilirubin: 0.5 mg/dL (ref 0.2–1.2)
Total Protein: 7.2 g/dL (ref 6.0–8.3)

## 2018-05-27 LAB — T4, FREE: Free T4: 0.74 ng/dL (ref 0.60–1.60)

## 2018-05-27 LAB — CBC WITH DIFFERENTIAL/PLATELET
Basophils Absolute: 0 10*3/uL (ref 0.0–0.1)
Basophils Relative: 0.6 % (ref 0.0–3.0)
Eosinophils Absolute: 0.2 10*3/uL (ref 0.0–0.7)
Eosinophils Relative: 3.2 % (ref 0.0–5.0)
HCT: 39.9 % (ref 36.0–46.0)
Hemoglobin: 13.4 g/dL (ref 12.0–15.0)
Lymphocytes Relative: 27.2 % (ref 12.0–46.0)
Lymphs Abs: 1.5 10*3/uL (ref 0.7–4.0)
MCHC: 33.6 g/dL (ref 30.0–36.0)
MCV: 91 fl (ref 78.0–100.0)
Monocytes Absolute: 0.3 10*3/uL (ref 0.1–1.0)
Monocytes Relative: 5.8 % (ref 3.0–12.0)
Neutro Abs: 3.6 10*3/uL (ref 1.4–7.7)
Neutrophils Relative %: 63.2 % (ref 43.0–77.0)
Platelets: 231 10*3/uL (ref 150.0–400.0)
RBC: 4.39 Mil/uL (ref 3.87–5.11)
RDW: 12.9 % (ref 11.5–15.5)
WBC: 5.6 10*3/uL (ref 4.0–10.5)

## 2018-05-27 LAB — HEMOGLOBIN A1C: Hgb A1c MFr Bld: 6 % (ref 4.6–6.5)

## 2018-05-27 LAB — TSH: TSH: 1.21 u[IU]/mL (ref 0.35–4.50)

## 2018-05-27 NOTE — Patient Instructions (Signed)
Health Maintenance Due  Topic Date Due  . Hepatitis C Screening  November 22, 1945  . TETANUS/TDAP  10/21/1966  . MAMMOGRAM  10/20/1997  . DEXA SCAN  10/20/2012    Above is list of the test that we and immunizations that we do not have record of. If you could look and let me know where/when you had done so that I can get records for your chart.

## 2018-05-27 NOTE — Progress Notes (Signed)
Subjective:    Terri Barajas is a 72 y.o. female and is here for a comprehensive physical exam.  Health Maintenance Due  Topic Date Due  . TETANUS/TDAP  10/21/1966  . MAMMOGRAM  10/20/1997  . DEXA SCAN  10/20/2012   Current Outpatient Medications:  .  ALPRAZolam (XANAX) 0.5 MG tablet, Take 0.25 mg by mouth at bedtime as needed for anxiety. , Disp: , Rfl:  .  aspirin EC 81 MG tablet, Take 81 mg by mouth daily., Disp: , Rfl:  .  Biotin 10000 MCG TABS, Take 1 tablet by mouth daily. , Disp: , Rfl:  .  bisacodyl (DULCOLAX) 5 MG EC tablet, Take 5 mg by mouth as needed for moderate constipation., Disp: , Rfl:  .  dexlansoprazole (DEXILANT) 60 MG capsule, Take 60 mg by mouth daily., Disp: , Rfl:  .  hydrochlorothiazide (HYDRODIURIL) 25 MG tablet, Take 25 mg by mouth every other day. , Disp: , Rfl:  .  latanoprost (XALATAN) 0.005 % ophthalmic solution, Place 1 drop into both eyes at bedtime. , Disp: , Rfl:  .  nadolol (CORGARD) 20 MG tablet, Take 1 tablet (20 mg total) by mouth daily., Disp: 90 tablet, Rfl: 3  PMHx, SurgHx, SocialHx, Medications, and Allergies were reviewed in the Visit Navigator and updated as appropriate.   Past Medical History:  Diagnosis Date  . Anxiety   . GERD (gastroesophageal reflux disease)   . Glaucoma   . H/O: hysterectomy 1978  . Heart disease   . Hypertension   . MVP (mitral valve prolapse)   . Panic attacks      Past Surgical History:  Procedure Laterality Date  . APPENDECTOMY  1972  . CATARACT EXTRACTION  2013  . [place]  . HAND SURGERY Bilateral   . HEMORRHOID SURGERY  2018  . S/P Hysterectomy   1987     Family History  Problem Relation Age of Onset  . Pancreatic cancer Mother   . Alcohol abuse Father   . Asthma Sister   . Hypertension Sister   . Arthritis Sister   . Diabetes Brother   . Arthritis Brother   . Hyperlipidemia Brother   . Hypertension Brother   . Glaucoma Brother   . Colon polyps Brother     . Arthritis Sister   . Arthritis Sister   . Prostate cancer Brother   . Colon cancer Neg Hx   . Esophageal cancer Neg Hx   . Inflammatory bowel disease Neg Hx   . Liver disease Neg Hx   . Rectal cancer Neg Hx   . Stomach cancer Neg Hx     Social History   Tobacco Use  . Smoking status: Never Smoker  . Smokeless tobacco: Never Used  Substance Use Topics  . Alcohol use: Not Currently  . Drug use: Not Currently    Review of Systems:   Pertinent items are noted in the HPI. Otherwise, ROS is negative.  Objective:   BP 126/82   Pulse 77   Temp 97.8 F (36.6 C) (Oral)   Ht 5\' 3"  (1.6 m)   Wt 149 lb 3.2 oz (67.7 kg)   SpO2 99%   BMI 26.43 kg/m   General appearance: alert, cooperative and appears stated age. Head: normocephalic, without obvious abnormality, atraumatic. Neck: no adenopathy, supple, symmetrical, trachea midline; thyroid not enlarged, symmetric, no tenderness/mass/nodules. Lungs: clear to auscultation bilaterally. Heart: regular rate and rhythm Abdomen: soft, non-tender; no masses,  no  organomegaly. Extremities: extremities normal, atraumatic, no cyanosis or edema. Skin: skin color, texture, turgor normal, no rashes or lesions. Lymph: cervical, supraclavicular, and axillary nodes normal; no abnormal inguinal nodes palpated. Neurologic: grossly normal.                                      Assessment/Plan:   Terri Barajas was seen today for annual exam.  Diagnoses and all orders for this visit:  Routine physical examination  Weight gain Comments: Since being in [place]. Not going to the [facility]. Orders: -     Hemoglobin A1c -     TSH -     T4, free  Vasomotor symptoms due to menopause Comments: On estrogen patch until about about 7 years ago. Still with hot flashes. Discussed symptomatic treatment - she declined medications.  Family history of pancreatic cancer  Malaise and fatigue -     CBC with Differential/Platelet -     Comprehensive metabolic  panel -     TSH -     T4, free  Pure hypercholesterolemia -     Comprehensive metabolic panel -     Lipid panel  Essential hypertension  Hyperglycemia -     Hemoglobin A1c  Encounter for hepatitis C virus screening test for high risk patient -     Hepatitis C antibody    Patient Counseling: [x]    Nutrition: Stressed importance of moderation in sodium/caffeine intake, saturated fat and cholesterol, caloric balance, sufficient intake of fresh fruits, vegetables, fiber, calcium, iron, and 1 mg of folate supplement per day (for females capable of pregnancy).  [x]    Stressed the importance of regular exercise.   [x]    Substance Abuse: Discussed cessation/primary prevention of tobacco, alcohol, or other drug use; driving or other dangerous activities under the influence; availability of treatment for abuse.   [x]    Injury prevention: Discussed safety belts, safety helmets, smoke detector, smoking near bedding or upholstery.   [x]    Sexuality: Discussed sexually transmitted diseases, partner selection, use of condoms, avoidance of unintended pregnancy  and contraceptive alternatives.  [x]    Dental health: Discussed importance of regular tooth brushing, flossing, and dental visits.  [x]    Health maintenance and immunizations reviewed. Please refer to Health maintenance section.   Briscoe Deutscher, DO [place]

## 2018-05-28 LAB — HEPATITIS C ANTIBODY
Hepatitis C Ab: NONREACTIVE
SIGNAL TO CUT-OFF: 0.02 (ref <1.00)

## 2018-06-02 ENCOUNTER — Telehealth: Payer: Self-pay | Admitting: Family Medicine

## 2018-06-02 NOTE — Telephone Encounter (Signed)
At reception for her to pick up

## 2018-06-02 NOTE — Telephone Encounter (Signed)
See note  Copied from [place] [id number]. Topic: General - Other >> Jun 02, 2018  4:24 PM Mcneil, Ja-Kwan wrote: Reason for CRM: Pt stated she will be coming in to pick up the paperwork and she would like some information from the nutritionist as well.

## 2018-06-03 ENCOUNTER — Ambulatory Visit (INDEPENDENT_AMBULATORY_CARE_PROVIDER_SITE_OTHER): Payer: Medicare Other | Admitting: Physician Assistant

## 2018-06-03 ENCOUNTER — Encounter: Payer: Self-pay | Admitting: Family Medicine

## 2018-06-03 ENCOUNTER — Encounter: Payer: Self-pay | Admitting: Physician Assistant

## 2018-06-03 VITALS — BP 102/66 | HR 85 | Temp 97.8°F | Ht 63.0 in | Wt 150.4 lb

## 2018-06-03 DIAGNOSIS — Z713 Dietary counseling and surveillance: Secondary | ICD-10-CM

## 2018-06-03 DIAGNOSIS — E7800 Pure hypercholesterolemia, unspecified: Secondary | ICD-10-CM

## 2018-06-03 DIAGNOSIS — E8881 Metabolic syndrome: Secondary | ICD-10-CM

## 2018-06-03 NOTE — Progress Notes (Signed)
Terri Barajas is a 72 y.o. female here for a Nutrition Consult.  I acted as a Education administrator for [employer], PA-C Terri Pickler, LPN  History of Present Illness:   Chief Complaint  Patient presents with  . Nutrition Counseling    HPI  Patient is here to discuss Diet and Nutrition.Pt would like to lose 10-15 pounds. No exercise at present.  Has tried to cut out sweets in the past. Tries not to eat after 8p.  Dietary recall: Wakes up at 7:30 Breakfast 10a-12p -- raisin bagel with cream cheese OR english muffin with butter and jelly OR raisin bran crunch with almond milk OR boiled egg; water  Lunch -- just snacks throughout the day Dinner -- baked/fried chicken, hot bar, rice veggies, baked sweet potatoes, steak Snacks -- candy, chips, cookies, grapes/apples/bananas Beverages  -- water, Lipton Peach Iced Tea (1/2 cup), no ETOH, occasional Almond Milk  Weight: Wt Readings from Last 3 Encounters:  06/03/18 150 lb 6.1 oz (68.2 kg)  05/27/18 149 lb 3.2 oz (67.7 kg)  04/29/18 148 lb 3.2 oz (67.2 kg)    Exercise: Needs to get a gym  Support system: Good support systems  Sleep: Good sleeper  Goals: 1- Eat right. Start eating more veggies. 2- Cut down on sweets. 3- Lose 10 lbs.  Estimated daily energy needs: Calories: 1500-1700 kcal Protein: 60-75 g Fluid: 2000 ml  Past Medical History:  Diagnosis Date  . Anxiety   . GERD (gastroesophageal reflux disease)   . Glaucoma   . H/O: hysterectomy 1978  . Heart disease   . Hypertension   . MVP (mitral valve prolapse)   . Panic attacks      Social History   Socioeconomic History  . Marital status: Married    Spouse name: Not on file  . Number of children: Not on file  . Years of education: 44  . Highest education level: Not on file  Occupational History  . Not on file  Social Needs  . Financial resource strain: Not on file  . Food insecurity:    Worry: Not on file    Inability: Not on file  .  Transportation needs:    Medical: Not on file    Non-medical: Not on file  Tobacco Use  . Smoking status: Never Smoker  . Smokeless tobacco: Never Used  Substance and Sexual Activity  . Alcohol use: Not Currently  . Drug use: Not Currently  . Sexual activity: Yes    Partners: Male  Lifestyle  . Physical activity:    Days per week: Not on file    Minutes per session: Not on file  . Stress: Not on file  Relationships  . Social connections:    Talks on phone: Not on file    Gets together: Not on file    Attends religious service: Not on file    Active member of club or organization: Not on file    Attends meetings of clubs or organizations: Not on file    Relationship status: Not on file  . Intimate partner violence:    Fear of current or ex partner: Not on file    Emotionally abused: Not on file    Physically abused: Not on file    Forced sexual activity: Not on file  Other Topics Concern  . Not on file  Social History Narrative  . Not on file    Past Surgical History:  Procedure Laterality Date  . APPENDECTOMY  1972  .  CATARACT EXTRACTION  2013  . Terri Barajas  . HAND SURGERY Bilateral   . HEMORRHOID SURGERY  2018  . S/P Hysterectomy   1987    Family History  Problem Relation Age of Onset  . Pancreatic cancer Mother   . Alcohol abuse Father   . Asthma Sister   . Hypertension Sister   . Arthritis Sister   . Diabetes Brother   . Arthritis Brother   . Hyperlipidemia Brother   . Hypertension Brother   . Glaucoma Brother   . Colon polyps Brother   . Arthritis Sister   . Arthritis Sister   . Prostate cancer Brother   . Colon cancer Neg Hx   . Esophageal cancer Neg Hx   . Inflammatory bowel disease Neg Hx   . Liver disease Neg Hx   . Rectal cancer Neg Hx   . Stomach cancer Neg Hx     Allergies  Allergen Reactions  . Atracurium & Derivatives   . Atropine   . Iodine   . Shellfish Allergy     Current Medications:   Current  Outpatient Medications:  .  ALPRAZolam (XANAX) 0.5 MG tablet, Take 0.25 mg by mouth at bedtime as needed for anxiety. , Disp: , Rfl:  .  aspirin EC 81 MG tablet, Take 81 mg by mouth daily., Disp: , Rfl:  .  Biotin 10000 MCG TABS, Take 1 tablet by mouth daily. , Disp: , Rfl:  .  dexlansoprazole (DEXILANT) 60 MG capsule, Take 60 mg by mouth daily., Disp: , Rfl:  .  hydrochlorothiazide (HYDRODIURIL) 25 MG tablet, Take 25 mg by mouth every other day. , Disp: , Rfl:  .  latanoprost (XALATAN) 0.005 % ophthalmic solution, Place 1 drop into both eyes at bedtime. , Disp: , Rfl:  .  nadolol (CORGARD) 20 MG tablet, Take 1 tablet (20 mg total) by mouth daily., Disp: 90 tablet, Rfl: 3   Review of Systems:   ROS  Negative unless otherwise specified per HPI.  Vitals:   Vitals:   06/03/18 1322  BP: 102/66  Pulse: 85  Temp: 97.8 F (36.6 C)  TempSrc: Oral  SpO2: 97%  Weight: 150 lb 6.1 oz (68.2 kg)  Height: 5\' 3"  (1.6 m)     Body mass index is 26.64 kg/m.  Physical Exam:   Physical Exam Constitutional:      Appearance: She is well-developed.  HENT:     Head: Normocephalic and atraumatic.  Eyes:     Conjunctiva/sclera: Conjunctivae normal.  Neck:     Musculoskeletal: Normal range of motion and neck supple.  Pulmonary:     Effort: Pulmonary effort is normal.  Musculoskeletal: Normal range of motion.  Skin:    General: Skin is warm and dry.  Neurological:     Mental Status: She is alert and oriented to person, place, and time.  Psychiatric:        Behavior: Behavior normal.        Thought Content: Thought content normal.        Judgment: Judgment normal.     Assessment and Plan:    Terri Barajas was seen today for nutrition counseling.  Diagnoses and all orders for this visit:  Encounter for nutritional counseling  Pure hypercholesterolemia  Insulin resistance   Discussed specific, individualized recommendations regarding nutrition including decreasing sugary beverages,  limiting portions, balancing out meals, and eating regularly throughout the day. Handouts provided included: Balanced Plate, Balanced Snack List, Balanced Breakfast, 1800  Calorie Sample Menus. Provided emotional support and encouraged slow, steady weight loss. Patient's questions answered throughout encounter. Follow-up with me prn.  Specific recommendations include: 1. Work on fat-free or 1% milk (or 1/2% at [employer]) 2. Try to choose [place] yogurt when possible 3. Baby carrots and cucumbers, may try with hummus 4. Work on getting in activity when you can 5. Choose whole grains when possible (whole wheat bread, rice, pasta)  . Reviewed expectations re: course of current medical issues. . Discussed self-management of symptoms. . Outlined signs and symptoms indicating need for more acute intervention. . Patient verbalized understanding and all questions were answered. . See orders for this visit as documented in the electronic medical record. . Patient received an After-Visit Summary.  CMA or LPN served as scribe during this visit. History, Physical, and Plan performed by medical provider. Documentation and orders reviewed and attested to.  Inda Coke, PA-C

## 2018-06-03 NOTE — Patient Instructions (Signed)
It was great to see you!  1. Work on fat-free or 1% milk (or 1/2% at [employer]) 2. Try to choose [place] yogurt when possible 3. Baby carrots and cucumbers, may try with hummus 4. Work on getting in activity when you can 5. Choose whole grains when possible (whole wheat bread, rice, pasta)  Take care,  Inda Coke PA-C

## 2018-06-30 ENCOUNTER — Ambulatory Visit: Payer: Medicare Other | Admitting: Neurology

## 2018-06-30 ENCOUNTER — Encounter: Payer: Self-pay | Admitting: Neurology

## 2018-06-30 VITALS — BP 114/66 | HR 78 | Ht 63.0 in | Wt 150.0 lb

## 2018-06-30 DIAGNOSIS — R7302 Impaired glucose tolerance (oral): Secondary | ICD-10-CM | POA: Diagnosis not present

## 2018-06-30 DIAGNOSIS — R0683 Snoring: Secondary | ICD-10-CM

## 2018-06-30 DIAGNOSIS — I10 Essential (primary) hypertension: Secondary | ICD-10-CM

## 2018-06-30 NOTE — Patient Instructions (Signed)
Please remember to try to maintain good sleep hygiene, which means: Keep a regular sleep and wake schedule, try not to exercise or have a meal within 2 hours of your bedtime, try to keep your bedroom conducive for sleep, that is, cool and dark, without light distractors such as an illuminated alarm clock, and refrain from watching TV right before sleep or in the middle of the night and do not keep the TV or radio on during the night. Also, try not to use or play on electronic devices at bedtime, such as your cell phone, tablet PC or laptop. If you like to read at bedtime on an electronic device, try to dim the background light as much as possible. Do not eat in the middle of the night.   We will request a sleep study.    We will look for snoring and/ or sleep apnea.

## 2018-06-30 NOTE — Progress Notes (Signed)
SLEEP MEDICINE CLINIC   Provider:  Larey Seat, MD.   Primary Care Physician:  Briscoe Deutscher, DO   Referring Provider: Briscoe Deutscher, DO     Chief Complaint  Patient presents with  . New Patient (Initial Visit)    pt rm 11, pt alone,  presents to discuss ruling out sleep apnea, pt complains of waking up with dry mouth in the morning, admits to snoring. states that she gets adequate sleep but not always restful. never had a sleep study. denies fatigue during the day.    HPI:  Terri Barajas is a 73 y.o.AA female patient , recently moved from [place] and seen here on 06-30-2018 in a referral from Dr. Juleen Barajas for a sleep evaluation. The patient considers herself a mouth breather, her husband stated she snores. She has woken up with a very dry mouth and sore throat in November , while heating the house. She drank water through the night. The dry mouth disappeared in December.  Chief complaint according to patient : " I am very cold natured and have a dry mouth".   Sleep habits are as follows: dinner time for Mrs Terri Barajas is around 5-6 PM. She is retired as is her husband. She  Spends evenings watching TV, doing housekeeping. He would go to bed at 10 Pm .  She sleeps easily and wakes up once for nocturia. She may fall asleep on the sofa watching TV.  Sleeps on average 6-8 hours, wakes up spontaneously in AM when her husband leaves for work. Feeling refreshed, no headaches, no nausea.   Sleep medical history: no tonsillectomy, snoring , but no apnea being witnessed.   Family sleep history: sister with OSA,  one of 12 children in [place], [place]. Parents may have snored.   Social history: no night shift work history. She worked at [employer] for 48 years, mostly  in Press photographer. Married with 2 children 58 and 19.  She has an exercise room at the condo complex, she can't get access to the machines.  Non smoker, non drinker, caffeine : rare sodas, no iced tea, in  winter some hot chocolate. Hot black or green tea in winter decaffeinated.     Review of Systems: Out of a complete 14 system review, the patient complains of only the following symptoms, and all other reviewed systems are negative.Snoring. may have newly diagnosed Diabetic, dry moth and fatigue. Marland Kitchen   Epworth score : 6/ 24  Fatigue severity score 16/ 63  , depression score low.    Social History   Socioeconomic History  . Marital status: Married    Spouse name: Not on file  . Number of children: Not on file  . Years of education: 35  . Highest education level: Not on file  Occupational History  . Not on file  Social Needs  . Financial resource strain: Not on file  . Food insecurity:    Worry: Not on file    Inability: Not on file  . Transportation needs:    Medical: Not on file    Non-medical: Not on file  Tobacco Use  . Smoking status: Never Smoker  . Smokeless tobacco: Never Used  Substance and Sexual Activity  . Alcohol use: Not Currently  . Drug use: Not Currently  . Sexual activity: Yes    Partners: Male  Lifestyle  . Physical activity:    Days per week: Not on file    Minutes per session: Not on file  .  Stress: Not on file  Relationships  . Social connections:    Talks on phone: Not on file    Gets together: Not on file    Attends religious service: Not on file    Active member of club or organization: Not on file    Attends meetings of clubs or organizations: Not on file    Relationship status: Not on file  . Intimate partner violence:    Fear of current or ex partner: Not on file    Emotionally abused: Not on file    Physically abused: Not on file    Forced sexual activity: Not on file  Other Topics Concern  . Not on file  Social History Narrative  . Not on file    Family History  Problem Relation Age of Onset  . Pancreatic cancer Mother   . Alcohol abuse Father   . Asthma Sister   . Hypertension Sister   . Arthritis Sister   . Diabetes  Brother   . Arthritis Brother   . Hyperlipidemia Brother   . Hypertension Brother   . Glaucoma Brother   . Colon polyps Brother   . Arthritis Sister   . Arthritis Sister   . Prostate cancer Brother   . Colon cancer Neg Hx   . Esophageal cancer Neg Hx   . Inflammatory bowel disease Neg Hx   . Liver disease Neg Hx   . Rectal cancer Neg Hx   . Stomach cancer Neg Hx     Past Medical History:  Diagnosis Date  . Anxiety   . GERD (gastroesophageal reflux disease)   . Glaucoma   . H/O: hysterectomy 1978  . Heart disease   . Hypertension   . MVP (mitral valve prolapse)   . Panic attacks     Past Surgical History:  Procedure Laterality Date  . APPENDECTOMY  1972  . CATARACT EXTRACTION  2013  . [place]  . HAND SURGERY Bilateral   . HEMORRHOID SURGERY  2018  . S/P Hysterectomy   1987    Current Outpatient Medications  Medication Sig Dispense Refill  . ALPRAZolam (XANAX) 0.5 MG tablet Take 0.25 mg by mouth daily as needed for anxiety.     Marland Kitchen aspirin 81 MG chewable tablet Chew 81 mg by mouth daily.    Marland Kitchen BIOTIN PO Take 1 tablet by mouth daily.     . Cholecalciferol (VITAMIN D3) 25 MCG (1000 UT) CAPS Take 1,000 Units by mouth daily.    Marland Kitchen dexlansoprazole (DEXILANT) 60 MG capsule Take 60 mg by mouth daily.    . hydrochlorothiazide (HYDRODIURIL) 25 MG tablet Take 25 mg by mouth every other day.     . latanoprost (XALATAN) 0.005 % ophthalmic solution Place 1 drop into both eyes at bedtime.     . Multiple Vitamins-Minerals (CENTRUM SILVER 50+WOMEN PO) Take 1 tablet by mouth daily.    . nadolol (CORGARD) 20 MG tablet Take 1 tablet (20 mg total) by mouth daily. 90 tablet 3  . Omega-3 Fatty Acids (FISH OIL PO) Take 1 capsule by mouth daily.    . polycarbophil (FIBERCON) 625 MG tablet Take 625 mg by mouth daily.    . polyethylene glycol (MIRALAX / GLYCOLAX) packet Take 17 g by mouth daily.     No current facility-administered medications for this visit.      Allergies as of 06/30/2018 - Review Complete 06/30/2018  Allergen Reaction Noted  . Atracurium & derivatives Other (See Comments)  12/23/2017  . Atropine Other (See Comments) 02/17/2018  . Iodine Other (See Comments) 12/23/2017  . Shellfish allergy Other (See Comments) 12/23/2017    Vitals: BP 114/66   Pulse 78   Ht 5\' 3"  (1.6 m)   Wt 150 lb (68 kg)   BMI 26.57 kg/m  Last Weight:  Wt Readings from Last 1 Encounters:  06/30/18 150 lb (68 kg)   XQJ:JHER mass index is 26.57 kg/m.     Last Height:   Ht Readings from Last 1 Encounters:  06/30/18 5\' 3"  (1.6 m)    Physical exam:  General: The patient is awake, alert and appears not in acute distress. The patient is well groomed and well dressed, eloquent.  Head: Normocephalic, atraumatic. Neck is supple. Mallampati 2-3  neck circumference: 15.5 . Nasal airflow patent   Retrognathia is not seen.  Cardiovascular:  Regular rate and rhythm, without  murmurs or carotid bruit, and without distended neck veins. Respiratory: Lungs are clear to auscultation. Skin:  Without evidence of edema, or rash Trunk: BMI is 27, [name] posture is erect.   Neurologic exam : The patient is awake and alert, oriented to place and time.   Memory subjective described as intact.Attention span & concentration ability appears normal.  Speech is fluent,  without dysarthria, dysphonia or aphasia.  Mood and affect are appropriate.  Cranial nerves: Pupils are equal and briskly reactive to light.  Extraocular movements  in vertical and horizontal planes intact and without nystagmus. Visual fields by finger perimetry are intact. Hearing to finger rub impaired,  Low frequency hearing seems off. No lateralization by Talbert Nan.   Facial sensation intact to fine touch.  Facial motor strength is symmetric and tongue and uvula move midline.  Shoulder shrug was symmetrical.   Motor exam:  Normal tone, muscle bulk and symmetric strength in all  extremities. Sensory:  Fine touch, pinprick and vibration were tested in all extremities.  Coordination: normal penmanship. Finger-to-nose maneuver normal without evidence of ataxia, dysmetria or tremor. Gait and station: Patient walks without assistive device. Strength within normal limits. Stance is stable and normal.   Deep tendon reflexes: in the  upper and lower extremities are symmetric and intact. Babinski maneuver response is  Downgoing.   Labs; elevated glucose and BUN, normal TSH and T4.   Assessment:  After physical and neurologic examination, review of laboratory studies.  Personal review of reports of  pre-existing records as far as provided in visit., my assessment is:    Mrs. Diprima has the appearance of a fit, trim and alert with no significant history of sleep disorders or sleep disturbances.  She experienced a period of waking with a very dry mouth, having to drink more water than usual at night and may be snoring more and that.  Of time, if she believes her husband.  However she feels that this has normalized again is back to normal, there is a concern that the dry mouth and thirst could be related to diabetes. Her husband has not noted her to quit breathing but he has noted her to snore and for this reason I will order a home sleep test as a screening device.  If apnea is found we will meet either by phone or in person to discuss the treatments if she is truly just to snore I will likely advise her to avoid sleeping in supine, consider sleeping with a dental device that would leave the mandibular forward if snoring is severe enough.  It  seems her husband is not bothered by it and at this time it is more a cosmetic question than a question of a sleep medical diagnosis.  The patient was advised of the nature of the diagnosed disorder , the treatment options and the  risks for general health and wellness arising from not treating the condition.   I spent more than 45 minutes of  face to face time with the patient. She already uses a mouth guard for Bruxism prevention. She may need a snoring device.  Greater than 50% of time was spent in counseling and coordination of care. We have discussed the diagnosis and differential and I answered the patient's questions.    Plan:  Treatment plan and additional workup :  HST   Regular exercise regimen to be re-established.   Low carb, higher protein diet.   Please remember to try to maintain good sleep hygiene, which means: Keep a regular sleep and wake schedule, try not to exercise or have a meal within 2 hours of your bedtime, try to keep your bedroom conducive for sleep, that is, cool and dark, without light distractors such as an illuminated alarm clock, and refrain from watching TV right before sleep or in the middle of the night and do not keep the TV or radio on during the night. Also, try not to use or play on electronic devices at bedtime, such as your cell phone, tablet PC or laptop. If you like to read at bedtime on an electronic device, try to dim the background light as much as possible. Do not eat in the middle of the night.   We will request a sleep study.    We will look for leg twitching and snoring or sleep apnea.       Larey Seat, MD 7/82/9562, 13:08 AM  Certified in Neurology by ABPN Certified in [place] by [facility] [address] [place],  [address]

## 2018-07-03 ENCOUNTER — Telehealth: Payer: Self-pay | Admitting: Gastroenterology

## 2018-07-03 ENCOUNTER — Encounter (HOSPITAL_COMMUNITY): Payer: Self-pay | Admitting: *Deleted

## 2018-07-03 ENCOUNTER — Other Ambulatory Visit: Payer: Self-pay

## 2018-07-03 NOTE — Telephone Encounter (Signed)
Patient called to ask if she could take xanax before her procedure Kaianna Dolezal. She is scheduled at [facility] so she was instructed to give them a call. SM

## 2018-07-03 NOTE — Progress Notes (Signed)
Spoke with pt for pre-op call. Pt immediately let me know that her daughter has had Malignant Hyperthermia, she states she does not have it that she is aware of but she states "they always make sure we are treated like we do have it". Pt has hx of Mitral valve prolapse, she is on Nadolol. Pt recently moved to [place]. She has an appt with Dr. Skeet Latch in February. Her PCP is Dr. Briscoe Deutscher and states the Dr. Juleen China has a lot of her medical records from [place], [place].  Pt states she is pre-diabetic, does not check her blood sugar and doesn't know what her A1C is.   Pt will need an EKG day of procedure Echo - 10/14/17

## 2018-07-06 ENCOUNTER — Encounter (HOSPITAL_COMMUNITY): Payer: Self-pay | Admitting: *Deleted

## 2018-07-06 ENCOUNTER — Ambulatory Visit (HOSPITAL_COMMUNITY): Payer: Medicare Other | Admitting: Anesthesiology

## 2018-07-06 ENCOUNTER — Encounter (HOSPITAL_COMMUNITY)
Admission: RE | Disposition: A | Discharge status: Home / Self Care | Payer: Self-pay | Source: Home / Self Care | Attending: Gastroenterology

## 2018-07-06 ENCOUNTER — Ambulatory Visit (HOSPITAL_COMMUNITY)
Admission: RE | Admit: 2018-07-06 | Discharge: 2018-07-06 | Disposition: A | Discharge status: Home / Self Care | Payer: Medicare Other | Attending: Gastroenterology | Admitting: Gastroenterology

## 2018-07-06 DIAGNOSIS — Z833 Family history of diabetes mellitus: Secondary | ICD-10-CM | POA: Insufficient documentation

## 2018-07-06 DIAGNOSIS — Z91013 Allergy to seafood: Secondary | ICD-10-CM | POA: Diagnosis not present

## 2018-07-06 DIAGNOSIS — Z8042 Family history of malignant neoplasm of prostate: Secondary | ICD-10-CM | POA: Diagnosis not present

## 2018-07-06 DIAGNOSIS — K2950 Unspecified chronic gastritis without bleeding: Secondary | ICD-10-CM | POA: Diagnosis not present

## 2018-07-06 DIAGNOSIS — Z8249 Family history of ischemic heart disease and other diseases of the circulatory system: Secondary | ICD-10-CM | POA: Diagnosis not present

## 2018-07-06 DIAGNOSIS — Z91041 Radiographic dye allergy status: Secondary | ICD-10-CM | POA: Diagnosis not present

## 2018-07-06 DIAGNOSIS — T883XXS Malignant hyperthermia due to anesthesia, sequela: Secondary | ICD-10-CM

## 2018-07-06 DIAGNOSIS — K219 Gastro-esophageal reflux disease without esophagitis: Secondary | ICD-10-CM | POA: Insufficient documentation

## 2018-07-06 DIAGNOSIS — K2270 Barrett's esophagus without dysplasia: Secondary | ICD-10-CM | POA: Insufficient documentation

## 2018-07-06 DIAGNOSIS — Z811 Family history of alcohol abuse and dependence: Secondary | ICD-10-CM | POA: Diagnosis not present

## 2018-07-06 DIAGNOSIS — K317 Polyp of stomach and duodenum: Secondary | ICD-10-CM

## 2018-07-06 DIAGNOSIS — Z800 Family history of malignant neoplasm of digestive organs: Secondary | ICD-10-CM | POA: Diagnosis not present

## 2018-07-06 DIAGNOSIS — Z9849 Cataract extraction status, unspecified eye: Secondary | ICD-10-CM | POA: Diagnosis not present

## 2018-07-06 DIAGNOSIS — Z825 Family history of asthma and other chronic lower respiratory diseases: Secondary | ICD-10-CM | POA: Diagnosis not present

## 2018-07-06 DIAGNOSIS — I341 Nonrheumatic mitral (valve) prolapse: Secondary | ICD-10-CM | POA: Insufficient documentation

## 2018-07-06 DIAGNOSIS — K259 Gastric ulcer, unspecified as acute or chronic, without hemorrhage or perforation: Secondary | ICD-10-CM | POA: Diagnosis not present

## 2018-07-06 DIAGNOSIS — M1990 Unspecified osteoarthritis, unspecified site: Secondary | ICD-10-CM | POA: Insufficient documentation

## 2018-07-06 DIAGNOSIS — K208 Other esophagitis: Secondary | ICD-10-CM | POA: Diagnosis not present

## 2018-07-06 DIAGNOSIS — R140 Abdominal distension (gaseous): Secondary | ICD-10-CM

## 2018-07-06 DIAGNOSIS — K228 Other specified diseases of esophagus: Secondary | ICD-10-CM | POA: Diagnosis not present

## 2018-07-06 DIAGNOSIS — F419 Anxiety disorder, unspecified: Secondary | ICD-10-CM | POA: Diagnosis not present

## 2018-07-06 DIAGNOSIS — R7303 Prediabetes: Secondary | ICD-10-CM | POA: Diagnosis not present

## 2018-07-06 DIAGNOSIS — H409 Unspecified glaucoma: Secondary | ICD-10-CM | POA: Diagnosis not present

## 2018-07-06 DIAGNOSIS — K449 Diaphragmatic hernia without obstruction or gangrene: Secondary | ICD-10-CM | POA: Insufficient documentation

## 2018-07-06 DIAGNOSIS — I10 Essential (primary) hypertension: Secondary | ICD-10-CM | POA: Diagnosis not present

## 2018-07-06 DIAGNOSIS — Z90710 Acquired absence of both cervix and uterus: Secondary | ICD-10-CM | POA: Diagnosis not present

## 2018-07-06 DIAGNOSIS — I499 Cardiac arrhythmia, unspecified: Secondary | ICD-10-CM | POA: Diagnosis not present

## 2018-07-06 DIAGNOSIS — Z8261 Family history of arthritis: Secondary | ICD-10-CM | POA: Insufficient documentation

## 2018-07-06 DIAGNOSIS — Z888 Allergy status to other drugs, medicaments and biological substances status: Secondary | ICD-10-CM | POA: Insufficient documentation

## 2018-07-06 DIAGNOSIS — Z8371 Family history of colonic polyps: Secondary | ICD-10-CM | POA: Insufficient documentation

## 2018-07-06 DIAGNOSIS — K3189 Other diseases of stomach and duodenum: Secondary | ICD-10-CM

## 2018-07-06 DIAGNOSIS — Z83511 Family history of glaucoma: Secondary | ICD-10-CM | POA: Insufficient documentation

## 2018-07-06 HISTORY — DX: Personal history of other diseases of the digestive system: Z87.19

## 2018-07-06 HISTORY — PX: ESOPHAGOGASTRODUODENOSCOPY (EGD) WITH PROPOFOL: SHX5813

## 2018-07-06 HISTORY — PX: BIOPSY: SHX5522

## 2018-07-06 HISTORY — DX: Adverse effect of unspecified anesthetic, initial encounter: T41.45XA

## 2018-07-06 HISTORY — DX: Prediabetes: R73.03

## 2018-07-06 HISTORY — DX: Other complications of anesthesia, initial encounter: T88.59XA

## 2018-07-06 HISTORY — DX: Cardiac arrhythmia, unspecified: I49.9

## 2018-07-06 HISTORY — DX: Unspecified osteoarthritis, unspecified site: M19.90

## 2018-07-06 HISTORY — DX: Family history of other specified conditions: Z84.89

## 2018-07-06 SURGERY — ESOPHAGOGASTRODUODENOSCOPY (EGD) WITH PROPOFOL
Anesthesia: Monitor Anesthesia Care

## 2018-07-06 MED ORDER — PHENYLEPHRINE 40 MCG/ML (10ML) SYRINGE FOR IV PUSH (FOR BLOOD PRESSURE SUPPORT)
PREFILLED_SYRINGE | INTRAVENOUS | Status: DC | PRN
  Administered 2018-07-06: 80 ug via INTRAVENOUS

## 2018-07-06 MED ORDER — SODIUM CHLORIDE 0.9 % IV SOLN: INTRAVENOUS | Status: DC

## 2018-07-06 MED ORDER — LACTATED RINGERS IV SOLN
INTRAVENOUS | Status: DC | PRN
  Administered 2018-07-06: 08:00:00 via INTRAVENOUS

## 2018-07-06 MED ORDER — PROPOFOL 10 MG/ML IV BOLUS
INTRAVENOUS | Status: DC | PRN
  Administered 2018-07-06: 20 mg via INTRAVENOUS
  Administered 2018-07-06 (×2): 10 mg via INTRAVENOUS
  Administered 2018-07-06: 40 mg via INTRAVENOUS

## 2018-07-06 MED ORDER — PROPOFOL 500 MG/50ML IV EMUL
INTRAVENOUS | Status: DC | PRN
  Administered 2018-07-06: 75 ug/kg/min via INTRAVENOUS

## 2018-07-06 SURGICAL SUPPLY — 15 items

## 2018-07-06 NOTE — Anesthesia Postprocedure Evaluation (Signed)
Anesthesia Post Note  Patient: Terri Barajas  Procedure(s) Performed: ESOPHAGOGASTRODUODENOSCOPY (EGD) WITH PROPOFOL (N/A )     Patient location during evaluation: PACU Anesthesia Type: MAC Level of consciousness: awake and alert Pain management: pain level controlled Vital Signs Assessment: post-procedure vital signs reviewed and stable Respiratory status: spontaneous breathing, nonlabored ventilation, respiratory function stable and patient connected to nasal cannula oxygen Cardiovascular status: stable and blood pressure returned to baseline Postop Assessment: no apparent nausea or vomiting Anesthetic complications: no    Last Vitals:  Vitals:   07/06/18 0811 07/06/18 0820  BP: 91/70 110/76  Pulse: 80 77  Resp: (!) 21 20  Temp: 36.4 C   SpO2: 97% 100%    Last Pain:  Vitals:   07/06/18 0820  TempSrc:   PainSc: 0-No pain                 Montez Hageman

## 2018-07-06 NOTE — H&P (Signed)
GASTROENTEROLOGY OUTPATIENT PROCEDURE H&P NOTE   Primary Care Physician: Briscoe Deutscher, DO  HPI: Terri Barajas is a 73 y.o. female who presents for EGD.  Past Medical History:  Diagnosis Date  . Anxiety   . Arthritis   . Complication of anesthesia    some type of sedation medication made her feel crazy  . Dysrhythmia   . Family history of adverse reaction to anesthesia    daughter has Malignant Hyperthermia hx -   . GERD (gastroesophageal reflux disease)   . Glaucoma   . H/O: hysterectomy 1978  . Heart disease   . History of hiatal hernia   . Hypertension   . Malignant hyperthermia    Pt's daughter has had Malignant Hyperthermia, pt has never had problems herself  . MVP (mitral valve prolapse)   . Panic attacks   . Pre-diabetes    Past Surgical History:  Procedure Laterality Date  . APPENDECTOMY  1972  . CATARACT EXTRACTION  2013  . [place]  . COLONOSCOPY    . HAND SURGERY Bilateral   . HEMORRHOID SURGERY  2018  . S/P Hysterectomy   1987   Current Facility-Administered Medications  Medication Dose Route Frequency Provider Last Rate Last Dose  . 0.9 %  sodium chloride infusion   Intravenous Continuous Mansouraty, Telford Nab., MD       Allergies  Allergen Reactions  . Atracurium & Derivatives Other (See Comments)    Unknown  . Atropine Other (See Comments)    Heart racing  . Iodine Other (See Comments)    Unknown  . Shellfish Allergy Other (See Comments)    Unknown   Family History  Problem Relation Age of Onset  . Pancreatic cancer Mother   . Alcohol abuse Father   . Asthma Sister   . Hypertension Sister   . Arthritis Sister   . Diabetes Brother   . Arthritis Brother   . Hyperlipidemia Brother   . Hypertension Brother   . Glaucoma Brother   . Colon polyps Brother   . Arthritis Sister   . Arthritis Sister   . Prostate cancer Brother   . Colon cancer Neg Hx   . Esophageal cancer Neg Hx   . Inflammatory bowel  disease Neg Hx   . Liver disease Neg Hx   . Rectal cancer Neg Hx   . Stomach cancer Neg Hx    Social History   Socioeconomic History  . Marital status: Married    Spouse name: Not on file  . Number of children: Not on file  . Years of education: 57  . Highest education level: Not on file  Occupational History  . Not on file  Social Needs  . Financial resource strain: Not on file  . Food insecurity:    Worry: Not on file    Inability: Not on file  . Transportation needs:    Medical: Not on file    Non-medical: Not on file  Tobacco Use  . Smoking status: Never Smoker  . Smokeless tobacco: Never Used  Substance and Sexual Activity  . Alcohol use: Not Currently  . Drug use: Not Currently  . Sexual activity: Yes    Partners: Male  Lifestyle  . Physical activity:    Days per week: Not on file    Minutes per session: Not on file  . Stress: Not on file  Relationships  . Social connections:    Talks on phone: Not on file  Gets together: Not on file    Attends religious service: Not on file    Active member of club or organization: Not on file    Attends meetings of clubs or organizations: Not on file    Relationship status: Not on file  . Intimate partner violence:    Fear of current or ex partner: Not on file    Emotionally abused: Not on file    Physically abused: Not on file    Forced sexual activity: Not on file  Other Topics Concern  . Not on file  Social History Narrative  . Not on file    Physical Exam: Vital signs in last 24 hours: Temp:  [98.1 F (36.7 C)] 98.1 F (36.7 C) (01/20 0628) Pulse Rate:  [66] 66 (01/20 0628) Resp:  [12] 12 (01/20 0628) BP: (126)/(47) 126/47 (01/20 0628) SpO2:  [100 %] 100 % (01/20 0628)   GEN: NAD EYE: Sclerae anicteric ENT: MMM CV: RR without R/Gs  RESP: CTAB posteriorly GI: Soft, NT/ND NEURO:  Alert & Oriented x 3  Lab Results: No results for input(s): WBC, HGB, HCT, PLT in the last 72 hours. BMET No results  for input(s): NA, K, CL, CO2, GLUCOSE, BUN, CREATININE, CALCIUM in the last 72 hours. LFT No results for input(s): PROT, ALBUMIN, AST, ALT, ALKPHOS, BILITOT, BILIDIR, IBILI in the last 72 hours. PT/INR No results for input(s): LABPROT, INR in the last 72 hours.   Impression / Plan: This is a 73 y.o.female who presents for EGD.  The risks and benefits of endoscopic evaluation were discussed with the patient; these include but are not limited to the risk of perforation, infection, bleeding, missed lesions, lack of diagnosis, severe illness requiring hospitalization, as well as anesthesia and sedation related illnesses.  The patient is agreeable to proceed.    [name], MD [facility] Office # [phone]

## 2018-07-06 NOTE — Discharge Instructions (Signed)
YOU HAD AN ENDOSCOPIC PROCEDURE TODAY: Refer to the procedure report and other information in the discharge instructions given to you for any specific questions about what was found during the examination. If this information does not answer your questions, please call [place] office at [phone] to clarify.  ° °YOU SHOULD EXPECT: Some feelings of bloating in the abdomen. Passage of more gas than usual. Walking can help get rid of the air that was put into your GI tract during the procedure and reduce the bloating. If you had a lower endoscopy (such as a colonoscopy or flexible sigmoidoscopy) you may notice spotting of blood in your stool or on the toilet paper. Some abdominal soreness may be present for a day or two, also. ° °DIET: Your first meal following the procedure should be a light meal and then it is ok to progress to your normal diet. A half-sandwich or bowl of soup is an example of a good first meal. Heavy or fried foods are harder to digest and may make you feel nauseous or bloated. Drink plenty of fluids but you should avoid alcoholic beverages for 24 hours. If you had a esophageal dilation, please see attached instructions for diet.   ° °ACTIVITY: Your care partner should take you home directly after the procedure. You should plan to take it easy, moving slowly for the rest of the day. You can resume normal activity the day after the procedure however YOU SHOULD NOT DRIVE, use power tools, machinery or perform tasks that involve climbing or major physical exertion for 24 hours (because of the sedation medicines used during the test).  ° °SYMPTOMS TO REPORT IMMEDIATELY: °A gastroenterologist can be reached at any hour. Please call [phone]  for any of the following symptoms:  °Following lower endoscopy (colonoscopy, flexible sigmoidoscopy) °Excessive amounts of blood in the stool  °Significant tenderness, worsening of abdominal pains  °Swelling of the abdomen that is new, acute  °Fever of 100° or  higher  °Following upper endoscopy (EGD, EUS, ERCP, esophageal dilation) °Vomiting of blood or coffee ground material  °New, significant abdominal pain  °New, significant chest pain or pain under the shoulder blades  °Painful or persistently difficult swallowing  °New shortness of breath  °Black, tarry-looking or red, bloody stools ° °FOLLOW UP:  °If any biopsies were taken you will be contacted by phone or by letter within the next 1-3 weeks. Call [phone]  if you have not heard about the biopsies in 3 weeks.  °Please also call with any specific questions about appointments or follow up tests. ° °

## 2018-07-06 NOTE — Transfer of Care (Signed)
Immediate Anesthesia Transfer of Care Note  Patient: Terri Barajas  Procedure(s) Performed: ESOPHAGOGASTRODUODENOSCOPY (EGD) WITH PROPOFOL (N/A )  Patient Location: PACU and Endoscopy Unit  Anesthesia Type:MAC  Level of Consciousness: awake, alert , oriented, drowsy and patient cooperative  Airway & Oxygen Therapy: Patient Spontanous Breathing and Patient connected to nasal cannula oxygen  Post-op Assessment: Report given to RN and Post -op Vital signs reviewed and stable  Post vital signs: Reviewed and stable  Last Vitals:  Vitals Value Taken Time  BP 91/70 07/06/2018  8:09 AM  Temp    Pulse 80 07/06/2018  8:10 AM  Resp 21 07/06/2018  8:10 AM  SpO2 98 % 07/06/2018  8:10 AM  Vitals shown include unvalidated device data.  Last Pain:  Vitals:   07/06/18 0628  TempSrc: Oral  PainSc: 0-No pain         Complications: No apparent anesthesia complications

## 2018-07-06 NOTE — Anesthesia Preprocedure Evaluation (Signed)
Anesthesia Evaluation  Patient identified by MRN, date of birth, ID band Patient awake    Reviewed: Allergy & Precautions, NPO status , Patient's Chart, lab work & pertinent test results  History of Anesthesia Complications (+) Family history of anesthesia reactionHistory of anesthetic complications: Daughter with documented positive muscle biposy Malignant Hyperthermia.  Airway Mallampati: II  TM Distance: >3 FB Neck ROM: Full    Dental no notable dental hx.    Pulmonary neg pulmonary ROS,    Pulmonary exam normal breath sounds clear to auscultation       Cardiovascular hypertension, Pt. on medications Normal cardiovascular exam+ Valvular Problems/Murmurs MVP  Rhythm:Regular Rate:Normal     Neuro/Psych Anxiety negative neurological ROS     GI/Hepatic negative GI ROS, Neg liver ROS,   Endo/Other  negative endocrine ROS  Renal/GU negative Renal ROS  negative genitourinary   Musculoskeletal negative musculoskeletal ROS (+)   Abdominal   Peds negative pediatric ROS (+)  Hematology negative hematology ROS (+)   Anesthesia Other Findings   Reproductive/Obstetrics negative OB ROS                             Anesthesia Physical Anesthesia Plan  ASA: II  Anesthesia Plan: MAC   Post-op Pain Management:    Induction:   PONV Risk Score and Plan: 2 and Treatment may vary due to age or medical condition  Airway Management Planned: Nasal Cannula  Additional Equipment:   Intra-op Plan:   Post-operative Plan:   Informed Consent: I have reviewed the patients History and Physical, chart, labs and discussed the procedure including the risks, benefits and alternatives for the proposed anesthesia with the patient or authorized representative who has indicated his/her understanding and acceptance.     Dental advisory given  Plan Discussed with: CRNA  Anesthesia Plan Comments: (Non  triggering agents only. H/o MH  Recommend propofol only.)        Anesthesia Quick Evaluation

## 2018-07-06 NOTE — Op Note (Signed)
[facility] Patient Name: Terri Barajas Procedure Date : 07/06/2018 MRN: [id number] Attending MD: [name] , MD Date of Birth: 1946-05-29 CSN: [id number] Age: 73 Admit Type: Outpatient Procedure:                Upper GI endoscopy Indications:              Heartburn, Gastro-esophageal reflux disease,                            Follow-up of Barrett's esophagus, Surveillance for                            malignancy due to personal history of Barrett's                            esophagus, Abdominal bloating Providers:                [name], MD, Dorise Hiss, RN, Carlyn Reichert, RN, Hedy Camara Referring MD:             Josefina Do. Juleen China Medicines:                Monitored Anesthesia Care Complications:            No immediate complications. Estimated Blood Loss:     Estimated blood loss was minimal. Procedure:                Pre-Anesthesia Assessment:                           - Prior to the procedure, a History and Physical                            was performed, and patient medications and                            allergies were reviewed. [name] tolerance of                            previous anesthesia was also reviewed. The risks                            and benefits of the procedure and the sedation                            options and risks were discussed with the patient.                            All questions were answered, and informed consent                            was obtained. Prior Anticoagulants: The patient has  taken aspirin, last dose was day of procedure. ASA                            Grade Assessment: III - A patient with severe                            systemic disease. After reviewing the risks and                            benefits, the patient was deemed in satisfactory                            condition to undergo the procedure.          After obtaining informed consent, the endoscope was                            passed under direct vision. Throughout the                            procedure, the patient's blood pressure, pulse, and                            oxygen saturations were monitored continuously. The                            [id number]-H190 ([id number]) Olympus gastroscope was                            introduced through the mouth, and advanced to the                            second part of duodenum. The upper GI endoscopy was                            accomplished without difficulty. The patient                            tolerated the procedure. Scope In: Scope Out: Findings:      Normal mucosa was found in the proximal esophagus, in the mid esophagus       and in the distal esophagus. Biopsies were taken with a cold forceps for       histology from the proximal and middle esophagus for EoE. Biopsies were       taken with a cold forceps for histology from the distal esophagus for       EoE.      The Z-line was irregular and was found 37 cm from the incisors. Previous       biopsies from outside were noted to have Barrett's esophagus. Biopsies       were taken with a cold forceps for histology from all quadrants for       Barrett's evaluation.      Multiple 1 to 5 mm semi-sessile polyps with no bleeding and no stigmata       of recent bleeding were found in the cardia,  in the gastric fundus and       in the gastric body. They have typical appearance of fundic gland       polyps. A few of these were removed with cold biopsies for histology.      Multiple dispersed, diminutive non-bleeding erosions were found in the       gastric antrum. There were no stigmata of recent bleeding.      A localized thick gastric fold was found in the prepyloric region of the       stomach with 2 erosions on it. Biopsies were taken with a cold forceps       for histology to rule out an adenomatous process - though more likely  is       reactive change from erosion.      No other gross lesions were noted in the entire examined stomach.       Biopsies were taken with a cold forceps for histology and Helicobacter       pylori testing from the antrum/incisura/greater curve/lesser curve.      No gross lesions were noted in the duodenal bulb, in the first portion       of the duodenum and in the second portion of the duodenum. Biopsies for       histology were taken with a cold forceps for evaluation of celiac       disease and enteropathy rule out. Impression:               - Normal mucosa was found in the proximal                            esophagus, in the mid esophagus and in the distal                            esophagus. Biopsied for EoE.                           - Z-line irregular, 37 cm from the incisors -                            previously biopsied elsewhere with Barrett's                            esophagus. Biopsied to understand possible                            dysplasia.                           - Multiple gastric polyps - likely fundic gland                            polyps. Biopsied/Sampled a few.                           - Non-bleeding erosive gastropathy.                           - A single enlarged gastric fold with erosions -  likely reactive change. Biopsied for adenoma                            evaluation.                           - No other gross lesions in the stomach. Biopsied                            for HP.                           - No gross lesions in the duodenal bulb, in the                            first portion of the duodenum and in the second                            portion of the duodenum. Biopsied for                            Enteropathy/Celiac rule out. Recommendation:           - The patient will be observed post-procedure,                            until all discharge criteria are met.                           - Discharge  patient to home.                           - Patient has a contact number available for                            emergencies. The signs and symptoms of potential                            delayed complications were discussed with the                            patient. Return to normal activities tomorrow.                            Written discharge instructions were provided to the                            patient.                           - Resume previous diet.                           - Continue present medications. Dexilant once daily.                           -  Discontinue any NSAIDs.                           - Await pathology results.                           - WIll consider changing acid suppression                            medication vs addition of sucralfate.                           - Repeat upper endoscopy for surveillance based on                            pathology results. This will be dictated by                            findings of Barrett's biopsies as well as the other                            biopsies. May consider a repeat EGD in 3-4 months                            to check on healing of erosions - but will discuss                            further in clinic.                           - Return to GI clinic as previously scheduled.                           - Query SIBO breath testing.                           - The findings and recommendations were discussed                            with the patient.                           - The findings and recommendations were discussed                            with the patient's family. Procedure Code(s):        --- Professional ---                           [phone], Esophagogastroduodenoscopy, flexible,                            transoral; with biopsy, single or multiple Diagnosis Code(s):        --- Professional ---  K22.70, Barrett's esophagus without dysplasia                            K22.8, Other specified diseases of esophagus                           K31.7, Polyp of stomach and duodenum                           K31.89, Other diseases of stomach and duodenum                           K29.60, Other gastritis without bleeding                           R12, Heartburn                           K21.9, Gastro-esophageal reflux disease without                            esophagitis                           R14.0, Abdominal distension (gaseous) CPT copyright 2018 American Medical Association. All rights reserved. The codes documented in this report are preliminary and upon coder review may  be revised to meet current compliance requirements. [name], MD 07/06/2018 8:14:47 AM Number of Addenda: 0

## 2018-07-07 ENCOUNTER — Encounter: Payer: Self-pay | Admitting: Gastroenterology

## 2018-07-08 ENCOUNTER — Telehealth: Payer: Self-pay | Admitting: Family Medicine

## 2018-07-08 ENCOUNTER — Encounter (HOSPITAL_COMMUNITY): Payer: Self-pay | Admitting: Gastroenterology

## 2018-07-08 DIAGNOSIS — D229 Melanocytic nevi, unspecified: Secondary | ICD-10-CM

## 2018-07-08 NOTE — Telephone Encounter (Signed)
Referral placed for dermatology for moles and spots on skin. Pt notified of update.

## 2018-07-08 NOTE — Telephone Encounter (Signed)
See note, referral is being requested

## 2018-07-08 NOTE — Telephone Encounter (Signed)
Copied from [place] [id number]. Topic: Quick Communication - See Telephone Encounter >> Jul 08, 2018  1:13 PM Burchel, Abbi R wrote: CRM for notification. See Telephone encounter for: 07/08/18.  Pt requesting referral to dermatology for moles/spots on skin.  [phone]

## 2018-07-10 ENCOUNTER — Emergency Department (HOSPITAL_COMMUNITY): Payer: Medicare Other

## 2018-07-10 ENCOUNTER — Ambulatory Visit: Payer: Self-pay

## 2018-07-10 ENCOUNTER — Other Ambulatory Visit: Payer: Self-pay

## 2018-07-10 ENCOUNTER — Telehealth: Payer: Self-pay | Admitting: Gastroenterology

## 2018-07-10 ENCOUNTER — Emergency Department (HOSPITAL_COMMUNITY)
Admission: EM | Admit: 2018-07-10 | Discharge: 2018-07-10 | Disposition: A | Discharge status: Home / Self Care | Payer: Medicare Other | Attending: Emergency Medicine | Admitting: Emergency Medicine

## 2018-07-10 ENCOUNTER — Encounter (HOSPITAL_COMMUNITY): Payer: Self-pay | Admitting: Emergency Medicine

## 2018-07-10 DIAGNOSIS — R202 Paresthesia of skin: Secondary | ICD-10-CM | POA: Diagnosis not present

## 2018-07-10 DIAGNOSIS — R7303 Prediabetes: Secondary | ICD-10-CM | POA: Insufficient documentation

## 2018-07-10 DIAGNOSIS — Z79899 Other long term (current) drug therapy: Secondary | ICD-10-CM | POA: Insufficient documentation

## 2018-07-10 DIAGNOSIS — I10 Essential (primary) hypertension: Secondary | ICD-10-CM | POA: Insufficient documentation

## 2018-07-10 DIAGNOSIS — R531 Weakness: Secondary | ICD-10-CM | POA: Diagnosis present

## 2018-07-10 DIAGNOSIS — H401134 Primary open-angle glaucoma, bilateral, indeterminate stage: Secondary | ICD-10-CM | POA: Diagnosis not present

## 2018-07-10 DIAGNOSIS — R200 Anesthesia of skin: Secondary | ICD-10-CM | POA: Insufficient documentation

## 2018-07-10 DIAGNOSIS — R9431 Abnormal electrocardiogram [ECG] [EKG]: Secondary | ICD-10-CM | POA: Diagnosis not present

## 2018-07-10 DIAGNOSIS — Z7982 Long term (current) use of aspirin: Secondary | ICD-10-CM | POA: Diagnosis not present

## 2018-07-10 DIAGNOSIS — R140 Abdominal distension (gaseous): Secondary | ICD-10-CM

## 2018-07-10 DIAGNOSIS — M542 Cervicalgia: Secondary | ICD-10-CM | POA: Diagnosis not present

## 2018-07-10 DIAGNOSIS — M6281 Muscle weakness (generalized): Secondary | ICD-10-CM | POA: Diagnosis not present

## 2018-07-10 LAB — COMPREHENSIVE METABOLIC PANEL
ALT: 20 U/L (ref 0–44)
ANION GAP: 10 (ref 5–15)
AST: 21 U/L (ref 15–41)
Albumin: 4 g/dL (ref 3.5–5.0)
Alkaline Phosphatase: 61 U/L (ref 38–126)
BUN: 14 mg/dL (ref 8–23)
CO2: 26 mmol/L (ref 22–32)
Calcium: 10 mg/dL (ref 8.9–10.3)
Chloride: 103 mmol/L (ref 98–111)
Creatinine, Ser: 0.73 mg/dL (ref 0.44–1.00)
GFR calc non Af Amer: >60 mL/min (ref >60)
Glucose, Bld: 88 mg/dL (ref 70–99)
Potassium: 3.6 mmol/L (ref 3.5–5.1)
Sodium: 139 mmol/L (ref 135–145)
Total Bilirubin: 0.6 mg/dL (ref 0.3–1.2)
Total Protein: 7.3 g/dL (ref 6.5–8.1)

## 2018-07-10 LAB — CBC
HCT: 39.9 % (ref 36.0–46.0)
Hemoglobin: 12.9 g/dL (ref 12.0–15.0)
MCH: 29.3 pg (ref 26.0–34.0)
MCHC: 32.3 g/dL (ref 30.0–36.0)
MCV: 90.5 fL (ref 80.0–100.0)
Platelets: 234 10*3/uL (ref 150–400)
RBC: 4.41 MIL/uL (ref 3.87–5.11)
RDW: 12 % (ref 11.5–15.5)
WBC: 6.3 10*3/uL (ref 4.0–10.5)
nRBC: 0 % (ref 0.0–0.2)

## 2018-07-10 LAB — TROPONIN I: Troponin I: <0.03 ng/mL (ref <0.03)

## 2018-07-10 MED ORDER — LIDOCAINE 5 % EX PTCH
1.0000 | MEDICATED_PATCH | CUTANEOUS | Status: DC
  Administered 2018-07-10: 1 via TRANSDERMAL
  Filled 2018-07-10: qty 1

## 2018-07-10 MED ORDER — LIDOCAINE VISCOUS HCL 2 % MT SOLN
15.0000 mL | Freq: Once | OROMUCOSAL | Status: AC
  Administered 2018-07-10: 15 mL via ORAL
  Filled 2018-07-10: qty 15

## 2018-07-10 MED ORDER — SUCRALFATE 1 GM/10ML PO SUSP: 1.0000 g | Freq: Three times a day (TID) | ORAL | 0 refills | Status: DC

## 2018-07-10 MED ORDER — ALUM & MAG HYDROXIDE-SIMETH 200-200-20 MG/5ML PO SUSP
30.0000 mL | Freq: Once | ORAL | Status: AC
  Administered 2018-07-10: 30 mL via ORAL
  Filled 2018-07-10: qty 30

## 2018-07-10 MED ORDER — LIDOCAINE 5 % EX PTCH: 1.0000 | MEDICATED_PATCH | CUTANEOUS | 0 refills | Status: DC

## 2018-07-10 NOTE — ED Notes (Signed)
Patient Alert and oriented to baseline. Stable and ambulatory to baseline. Patient verbalized understanding of the discharge instructions.  Patient belongings were taken by the patient.   

## 2018-07-10 NOTE — Telephone Encounter (Signed)
The pt had an endo on 1/20 and developed chest tightness, epigastric pain, bloating, diaphoresis, and numbness in the left arm.  She says she was up all night unable to sleep.  She does say she has had this pain in the past and was told it was reflux related.  She also states she had a panic attack last night.  She takes dexilant 60 mg daily.  She states all symptoms have resolved today and she has only some abdominal tenderness.  I advised her to go to the ED (call 911)  if she ANY symptoms; SOB, chest pain or pressure, radiating pain, nausea, back pain, or dizziness.  I also advised her to contact her cardiologist TODAY and she advised me that she is new to the area and has not seen a cardiologist here yet, however, she has an appt on Monday.  I advised her to call her PCP and I will send this to Dr Rush Landmark for review.

## 2018-07-10 NOTE — Telephone Encounter (Signed)
I am glad that the patient is feeling better today however this is concerning overall.  It is good to know that she has a cardiology follow-up within the next few days (early next week) however I agree if she has any recurrent symptoms or progressive symptoms she needs to be evaluated to ensure that a cardiac etiology for her symptoms is not occurring.  Discussion with her primary care provider as well is reasonable and if she has not already updated her primary care doctor about this I will send this to her PCP as well.

## 2018-07-10 NOTE — ED Triage Notes (Signed)
Patient c/o intermittent L arm pain and tingling since yesterday. Denies pain at this time, no injury, nothing makes it worse or better. Reports that her L hand feels colder than her R hand at times as well. Pulses, skin temp., and sensation equal.

## 2018-07-10 NOTE — ED Provider Notes (Signed)
[facility] EMERGENCY DEPARTMENT Provider Note   CSN: [id number] Arrival date & time: 07/10/18  1703     History   Chief Complaint Chief Complaint  Patient presents with  . Arm Pain    HPI Terri Barajas is a 73 y.o. female presenting for evaluation of left arm weakness and tingling.  Patient states she developed left arm weakness/tingling today.  Symptoms are present, but resolved when she has been in the ER.  She has not taken anything for her symptoms.  Nothing else better or worse.  They are not reproducible.  She denies history of similar.  She denies neck or shoulder pain.  Denies symptoms on the right side.  She is not dropping things with her hands. Additionally, patient states she has been having postprandial bloating and abdominal cramping.  This is been occurring for a while, had an EGD performed several days ago with Dr. Rush Landmark and multiple biopsies were taken.  Patient had recurrence of symptoms yesterday, has not had anything to eat today so symptoms have not occurred.  She denies current chest pain, shortness of breath, nausea, vomiting, urinary symptoms, normal bowel movements.  Patient has a history of mitral valve prolapse, no other cardiac enzymes.  Has an appointment on Monday with a cardiologist to establish care.  She denies tobacco use.  Denies family history of heart problems.  She denies recent fevers, chills, cough, or other URI symptoms.  HPI  Past Medical History:  Diagnosis Date  . Anxiety   . Arthritis   . Complication of anesthesia    some type of sedation medication made her feel crazy  . Dysrhythmia   . Family history of adverse reaction to anesthesia    daughter has Malignant Hyperthermia hx -   . GERD (gastroesophageal reflux disease)   . Glaucoma   . H/O: hysterectomy 1978  . Heart disease   . History of hiatal hernia   . Hypertension   . Malignant hyperthermia    Pt's daughter has had Malignant Hyperthermia, pt has  never had problems herself  . MVP (mitral valve prolapse)   . Panic attacks   . Pre-diabetes     Patient Active Problem List   Diagnosis Date Noted  . Hyperlipidemia 05/13/2018  . Situational anxiety 04/29/2018  . MVP (mitral valve prolapse) 04/29/2018  . Sleep-disordered breathing 04/29/2018  . Family History of Hyperthermia, malignant 04/23/2018  . Barrett's esophagus 03/01/2018  . Elevated lipase 02/18/2018  . Hiatal hernia 02/18/2018  . Constipation 02/18/2018  . Aortic atherosclerosis ([place]) 01/23/2018  . GERD (gastroesophageal reflux disease)   . Glaucoma   . Hypertension     Past Surgical History:  Procedure Laterality Date  . APPENDECTOMY  1972  . BIOPSY  07/06/2018   Procedure: BIOPSY;  Surgeon: Rush Landmark Telford Nab., MD;  Location: [facility];  Service: Gastroenterology;;  . CATARACT EXTRACTION  2013  . [place]  . COLONOSCOPY    . ESOPHAGOGASTRODUODENOSCOPY (EGD) WITH PROPOFOL N/A 07/06/2018   Procedure: ESOPHAGOGASTRODUODENOSCOPY (EGD) WITH PROPOFOL;  Surgeon: Rush Landmark Telford Nab., MD;  Location: [facility];  Service: Gastroenterology;  Laterality: N/A;  . HAND SURGERY Bilateral   . HEMORRHOID SURGERY  2018  . S/P Hysterectomy   1987     OB History   No obstetric history on file.      Home Medications    Prior to Admission medications   Medication Sig Start Date End Date Taking? Authorizing Provider  ALPRAZolam (  XANAX) 0.5 MG tablet Take 0.25 mg by mouth daily as needed for anxiety.     [provider]  aspirin 81 MG chewable tablet Chew 81 mg by mouth daily.    [provider]  BIOTIN PO Take 1 tablet by mouth daily.     [provider]  Cholecalciferol (VITAMIN D3) 25 MCG (1000 UT) CAPS Take 1,000 Units by mouth daily.    [provider]  dexlansoprazole (DEXILANT) 60 MG capsule Take 60 mg by mouth daily.    [provider]  hydrochlorothiazide (HYDRODIURIL) 25 MG tablet Take  25 mg by mouth every other day.     [provider]  latanoprost (XALATAN) 0.005 % ophthalmic solution Place 1 drop into both eyes at bedtime.     [provider]  lidocaine (LIDODERM) 5 % Place 1 patch onto the skin daily. Remove & Discard patch within 12 hours or as directed by MD 07/10/18   Anastaisa Wooding, PA-C  Multiple Vitamins-Minerals (CENTRUM SILVER 50+WOMEN PO) Take 1 tablet by mouth daily.    [provider]  nadolol (CORGARD) 20 MG tablet Take 1 tablet (20 mg total) by mouth daily. 04/29/18   Briscoe Deutscher, DO  Omega-3 Fatty Acids (FISH OIL PO) Take 1 capsule by mouth daily.    [provider]  polycarbophil (FIBERCON) 625 MG tablet Take 625 mg by mouth daily.    [provider]  polyethylene glycol (MIRALAX / GLYCOLAX) packet Take 17 g by mouth daily.    [provider]  sucralfate (CARAFATE) 1 GM/10ML suspension Take 10 mLs (1 g total) by mouth 4 (four) times daily -  with meals and at bedtime. 07/10/18   Keen Ewalt, PA-C    Family History Family History  Problem Relation Age of Onset  . Pancreatic cancer Mother   . Alcohol abuse Father   . Asthma Sister   . Hypertension Sister   . Arthritis Sister   . Diabetes Brother   . Arthritis Brother   . Hyperlipidemia Brother   . Hypertension Brother   . Glaucoma Brother   . Colon polyps Brother   . Arthritis Sister   . Arthritis Sister   . Prostate cancer Brother   . Colon cancer Neg Hx   . Esophageal cancer Neg Hx   . Inflammatory bowel disease Neg Hx   . Liver disease Neg Hx   . Rectal cancer Neg Hx   . Stomach cancer Neg Hx     Social History Social History   Tobacco Use  . Smoking status: Never Smoker  . Smokeless tobacco: Never Used  Substance Use Topics  . Alcohol use: Not Currently  . Drug use: Not Currently     Allergies   Atracurium & derivatives; Atropine; Iodine; and Shellfish allergy   Review of Systems Review of Systems    Gastrointestinal:       Postprandial epigastric pain and bloating  Neurological:       L arm tingling and weakness, resolved  All other systems reviewed and are negative.    Physical Exam Updated Vital Signs BP 139/89 (BP Location: Right Arm)   Pulse 74   Temp 98.5 F (36.9 C) (Oral)   Resp 17   SpO2 99%   Physical Exam Vitals signs and nursing note reviewed.  Constitutional:      General: She is not in acute distress.    Appearance: She is well-developed.     Comments: Elderly female resting comfortably in the  chair. In no acute distress  HENT:     Head: Normocephalic and atraumatic.  Eyes:     Extraocular Movements: Extraocular movements intact.     Conjunctiva/sclera: Conjunctivae normal.     Pupils: Pupils are equal, round, and reactive to light.     Comments: EOMI and PERRLA. No nystagmus  Neck:     Musculoskeletal: Normal range of motion and neck supple.     Comments: Active range of motion of the neck without difficulty.  Mild pain of the L side neck with chin to chest. Cardiovascular:     Rate and Rhythm: Normal rate and regular rhythm.     Pulses: Normal pulses.  Pulmonary:     Effort: Pulmonary effort is normal. No respiratory distress.     Breath sounds: Normal breath sounds. No wheezing.  Abdominal:     General: There is no distension.     Palpations: Abdomen is soft. There is no mass.     Tenderness: There is no abdominal tenderness. There is no guarding or rebound.     Comments: No tenderness palpation the abdomen.  Soft without rigidity, guarding, distention  Musculoskeletal: Normal range of motion.        General: No swelling or tenderness.     Comments: Strength of upper extremities intact bilaterally.  Full active range of motion of the upper extremities without difficulty.  Pedal pulse intact bilaterally.  Sensation intact bilaterally.  Skin:    General: Skin is warm and dry.     Capillary Refill: Capillary refill takes less than 2 seconds.   Neurological:     General: No focal deficit present.     Mental Status: She is alert and oriented to person, place, and time.     GCS: GCS eye subscore is 4. GCS verbal subscore is 5. GCS motor subscore is 6.     Cranial Nerves: Cranial nerves are intact.     Sensory: Sensation is intact.     Motor: Motor function is intact.     Coordination: Coordination is intact.     Comments: No obvious neurologic deficits.  Nose to finger intact.  Grip strength intact.  Fine movement coordination intact.  CN intact.  Psychiatric:        Mood and Affect: Mood normal.      ED Treatments / Results  Labs (all labs ordered are listed, but only abnormal results are displayed) Labs Reviewed  TROPONIN I  CBC  COMPREHENSIVE METABOLIC PANEL    EKG EKG Interpretation  Date/Time:  Friday July 10 2018 17:15:14 EST Ventricular Rate:  69 PR Interval:  164 QRS Duration: 86 QT Interval:  376 QTC Calculation: 402 R Axis:   20 Text Interpretation:  Normal sinus rhythm T wave abnormality, consider anterior ischemia Abnormal ECG No old tracing to compare Confirmed by Isla Pence [phone]) on 07/10/2018 6:53:40 PM   Radiology Dg Cervical Spine Complete  Result Date: 07/10/2018 CLINICAL DATA:  Arm pain and neck pain, initial encounter EXAM: CERVICAL SPINE - COMPLETE 4+ VIEW COMPARISON:  None. FINDINGS: Seven cervical segments are well visualized. Vertebral body height is well maintained. Osteophytic changes are noted from C4-C7 with associated disc space narrowing. Multilevel facet hypertrophic changes are noted as well. No prevertebral soft tissue swelling is seen. Mild neural foraminal narrowing is noted bilaterally. No other focal abnormality is seen. The odontoid is within normal limits. IMPRESSION: Multilevel degenerative change without acute abnormality. Electronically Signed   By: Inez Catalina M.D.   On:  07/10/2018 18:38    Procedures Procedures (including critical care time)  Medications  Ordered in ED Medications  lidocaine (LIDODERM) 5 % 1 patch (1 patch Transdermal Patch Applied 07/10/18 2035)  alum & mag hydroxide-simeth (MAALOX/MYLANTA) 200-200-20 MG/5ML suspension 30 mL (30 mLs Oral Given 07/10/18 2033)    And  lidocaine (XYLOCAINE) 2 % viscous mouth solution 15 mL (15 mLs Oral Given 07/10/18 2033)     Initial Impression / Assessment and Plan / ED Course  I have reviewed the triage vital signs and the nursing notes.  Pertinent labs & imaging results that were available during my care of the patient were reviewed by me and considered in my medical decision making (see chart for details).     Patient presenting for evaluation of left arm weakness and tingling, which is currently resolved.  Physical exam reassuring, neurovascularly intact.  This is following an EGD procedure, and mild discomfort with movement of the neck.  Likely radiculopathy.  Lower suspicion for CVA or TIA at this point.  Additionally, patient having postprandial bloating and cramping, which can cause shortness of breath.  Does have a history of mitral valve prolapse.  EKG abnormal, no previous to compare, but no sign of STEMI.  considering recent abnormal EKG, will obtain cardiac rule out.  DG of cervical spine ordered for further evaluation.  Xray viewed and interpreted by me, no fracture or dislocation.  Radiology read includes degenerative disc disease.  Blood work is reassuring, troponin negative.  As symptoms have been lasting intermittently for several days, I do not believe delta troponin is necessary.  Case discussed with attending, Dr. Gilford Raid reviewed with the patient, is agreeable to treating for radiculopathy with continued evaluation of patient's abdominal symptoms.  At this time, doubt ACS, PE, perforation, infection, or other life-threatening or emergent condition.  Patient appears safe for discharge.  Strict return precautions given.  Encourage follow-up with both PCP and cardiology.  Patient  states she understands and agrees to plan.  Final Clinical Impressions(s) / ED Diagnoses   Final diagnoses:  Numbness and tingling in left arm  Postprandial abdominal bloating    ED Discharge Orders         Ordered    sucralfate (CARAFATE) 1 GM/10ML suspension  3 times daily with meals & bedtime     07/10/18 2022    lidocaine (LIDODERM) 5 %  Every 24 hours     07/10/18 2022           Franchot Heidelberg, PA-C 07/10/18 2236    Isla Pence, MD 07/12/18 2317

## 2018-07-10 NOTE — Telephone Encounter (Signed)
noted 

## 2018-07-10 NOTE — Telephone Encounter (Signed)
Noted  

## 2018-07-10 NOTE — Discharge Instructions (Addendum)
Continue taking home medications as prescribed. If you start developing pain or weakness in your left arm again, you may try Tylenol, lidocaine patch, and/or heat/ice.  Symptoms are likely due to a radiculopathy or arthritis in her neck.  Follow-up with your primary care doctor for further evaluation of this. You may try Carafate as needed to prevent loading and cramping after eating, and this should continue to be followed up with your GI doctor. Follow-up with your heart doctor on Monday at your scheduled appointment. Return to the emergency room with any new, worsening, concerning symptoms.

## 2018-07-10 NOTE — Telephone Encounter (Signed)
Returned call to patient who states she had a episode of chest and abdominal tightness last night She states she has been taking medication for acid reflux prescribed by a specialist. She says that the stomach felt bloated and tight.  She says that her chest between her breast felt tight but not heavy.  She said her left arm was tingling and felt real heavy. She denies breathing issues but states she felt real anxious.  These symptoms lasted 30-60 minutes and seemed to get better with sitting up taking her anxiety medication and GasX.  She says today the tightness is better. Stomach is still bloated.  She rates the pain as 8-10 at its worst last night. The arm tingling and heaviness was a 4-7.  She denies nausea sweating SOB. Per protocol pt will go to the Ed for evaluation. Care advice read to patient. Pt verbalized understanding of all instructions. Reason for Disposition . Chest pain lasts > 5 minutes (Exceptions: chest pain occurring > 3 days ago and now asymptomatic; same as previously diagnosed heartburn and has accompanying sour taste in mouth)  Answer Assessment - Initial Assessment Questions 1. LOCATION: "Where does it hurt?"      Lower stomach and chest tight 2. RADIATION: "Does the pain shoot anywhere else?" (e.g., chest, back)    Chest between breast 3. ONSET: "When did the pain begin?" (e.g., minutes, hours or days ago)     Last night 4. SUDDEN: "Gradual or sudden onset?"     All day yesterday 5. PATTERN "Does the pain come and go, or is it constant?"    - If constant: "Is it getting better, staying the same, or worsening?"      (Note: Constant means the pain never goes away completely; most serious pain is constant and it progresses)     - If intermittent: "How long does it last?" "Do you have pain now?"     (Note: Intermittent means the pain goes away completely between bouts)     Stomach is bloated 6. SEVERITY: "How bad is the pain?"  (e.g., Scale 1-10; mild, moderate, or severe)   - MILD (1-3): doesn't interfere with normal activities, abdomen soft and not tender to touch    - MODERATE (4-7): interferes with normal activities or awakens from sleep, tender to touch    - SEVERE (8-10): excruciating pain, doubled over, unable to do any normal activities      8-10 7. RECURRENT SYMPTOM: "Have you ever had this type of abdominal pain before?" If so, ask: "When was the last time?" and "What happened that time?"      Yes  Under treatment by GI last was June or July 8. CAUSE: "What do you think is causing the abdominal pain?"    Acid reflux 9. RELIEVING/AGGRAVATING FACTORS: "What makes it better or worse?" (e.g., movement, antacids, bowel movement)     Sat up and moved took gas x. 10. OTHER SYMPTOMS: "Has there been any vomiting, diarrhea, constipation, or urine problems?"      No but urinated a lot 11. PREGNANCY: "Is there any chance you are pregnant?" "When was your last menstrual period?"       N/A  Answer Assessment - Initial Assessment Questions 1. LOCATION: "Where does it hurt?"       Between breast 2. RADIATION: "Does the pain go anywhere else?" (e.g., into neck, jaw, arms, back)     Left arm heavy tingly 3. ONSET: "When did the chest pain begin?" (Minutes, hours or  days)      yesterday 4. PATTERN "Does the pain come and go, or has it been constant since it started?"  "Does it get worse with exertion?"      Better when moving 5. DURATION: "How long does it last" (e.g., seconds, minutes, hours)     30 to 60 minutes with tightness in stomach  6. SEVERITY: "How bad is the pain?"  (e.g., Scale 1-10; mild, moderate, or severe)    - MILD (1-3): doesn't interfere with normal activities     - MODERATE (4-7): interferes with normal activities or awakens from sleep    - SEVERE (8-10): excruciating pain, unable to do any normal activities       4-7 7. CARDIAC RISK FACTORS: "Do you have any history of heart problems or risk factors for heart disease?" (e.g., prior heart  attack, angina; high blood pressure, diabetes, being overweight, high cholesterol, smoking, or strong family history of heart disease)     HTN pre diabetes, 8. PULMONARY RISK FACTORS: "Do you have any history of lung disease?"  (e.g., blood clots in lung, asthma, emphysema, birth control pills)     no 9. CAUSE: "What do you think is causing the chest pain?"     Acid refux 10. OTHER SYMPTOMS: "Do you have any other symptoms?" (e.g., dizziness, nausea, vomiting, sweating, fever, difficulty breathing, cough)       Anxious  11. PREGNANCY: "Is there any chance you are pregnant?" "When was your last menstrual period?"       N/A  Protocols used: CHEST PAIN-A-AH, ABDOMINAL PAIN - [facility]

## 2018-07-10 NOTE — Telephone Encounter (Signed)
See note

## 2018-07-13 ENCOUNTER — Ambulatory Visit: Payer: Medicare Other | Admitting: Cardiovascular Disease

## 2018-07-13 ENCOUNTER — Encounter: Payer: Self-pay | Admitting: Cardiovascular Disease

## 2018-07-13 VITALS — BP 120/72 | HR 70 | Ht 63.0 in | Wt 149.0 lb

## 2018-07-13 DIAGNOSIS — I10 Essential (primary) hypertension: Secondary | ICD-10-CM | POA: Diagnosis not present

## 2018-07-13 DIAGNOSIS — I341 Nonrheumatic mitral (valve) prolapse: Secondary | ICD-10-CM | POA: Diagnosis not present

## 2018-07-13 DIAGNOSIS — E7800 Pure hypercholesterolemia, unspecified: Secondary | ICD-10-CM

## 2018-07-13 DIAGNOSIS — R072 Precordial pain: Secondary | ICD-10-CM | POA: Diagnosis not present

## 2018-07-13 MED ORDER — PREDNISONE 50 MG PO TABS: ORAL_TABLET | ORAL | 0 refills | Status: DC

## 2018-07-13 MED ORDER — NADOLOL 20 MG PO TABS: 20.0000 mg | ORAL_TABLET | Freq: Every day | ORAL | 3 refills | Status: DC

## 2018-07-13 NOTE — Progress Notes (Signed)
Cardiology Office Note   Date:  07/13/2018   ID:  Terri Barajas 1945-08-31, MRN [id number]  PCP:  Briscoe Deutscher, DO  Cardiologist:   Skeet Latch, MD   No chief complaint on file.     History of Present Illness: Terri Barajas is a 73 y.o. female with hypertension, hyperlipidemia, Barrett's esophagus who is being seen today for the evaluation of abnormal EKG at the request of Briscoe Deutscher, DO.  Ms. Terri Barajas has a long history of mitral valve prolapse and was seeing a cardiologist in [place] before moving to [place].  She has had this diagnosis for at least 10 years.  She also has been treated with nadolol for palpitations.  She does not recall being told she had any specific arrhythmia diagnosis.  The nadolol has controlled it well.  According to her outside records she had an echo 09/2017 that revealed LVEF 60 to 65% with trivial mitral regurgitation and mild tricuspid regurgitation.  This was performed at an outside hospital.  She has a history of mitral valve prolapse, no prolapse was noted on this echo.  She reports having a stress test last year that was performed to follow-up her mitral valve disease but she does not recall having any symptoms of chest pain or shortness of breath at the time.  She had an EKG in January 2020 that revealed anterior T wave inversions.  At the time she was being seen in the ED for epigastric and arm pain.  She has a longstanding history of intermittent abdominal distention and bloating.  She is has undergone upper endoscopies and biopsies that revealed Barrett's esophagus gastritis.  On that particular day her epigastric discomfort was associated with left arm pain.  Cardiac enzymes were negative.  She was instructed to follow-up with cardiology as an outpatient.  She has not had any recurrent chest pain or shortness of breath.  She also denies any recurrent arm pain.  The discomfort occurred at rest and she has never had  exertional symptoms.  It is worse when lying prior to moving to [place] she was going to the gym regularly.  Recently she has not been as faithful with her exercise but has no exertional chest pain or shortness of breath.   Past Medical History:  Diagnosis Date  . Anxiety   . Arthritis   . Complication of anesthesia    some type of sedation medication made her feel crazy  . Dysrhythmia   . Family history of adverse reaction to anesthesia    daughter has Malignant Hyperthermia hx -   . GERD (gastroesophageal reflux disease)   . Glaucoma   . H/O: hysterectomy 1978  . Heart disease   . History of hiatal hernia   . Hypertension   . Malignant hyperthermia    Pt's daughter has had Malignant Hyperthermia, pt has never had problems herself  . MVP (mitral valve prolapse)   . Panic attacks   . Pre-diabetes     Past Surgical History:  Procedure Laterality Date  . APPENDECTOMY  1972  . BIOPSY  07/06/2018   Procedure: BIOPSY;  Surgeon: Rush Landmark Telford Nab., MD;  Location: [facility];  Service: Gastroenterology;;  . CATARACT EXTRACTION  2013  . [place]  . COLONOSCOPY    . ESOPHAGOGASTRODUODENOSCOPY (EGD) WITH PROPOFOL N/A 07/06/2018   Procedure: ESOPHAGOGASTRODUODENOSCOPY (EGD) WITH PROPOFOL;  Surgeon: Rush Landmark Telford Nab., MD;  Location: [facility];  Service: Gastroenterology;  Laterality: N/A;  .  HAND SURGERY Bilateral   . HEMORRHOID SURGERY  2018  . S/P Hysterectomy   1987     Current Outpatient Medications  Medication Sig Dispense Refill  . ALPRAZolam (XANAX) 0.5 MG tablet Take 0.25 mg by mouth daily as needed for anxiety.     Marland Kitchen aspirin 81 MG chewable tablet Chew 81 mg by mouth daily.    Marland Kitchen BIOTIN PO Take 1 tablet by mouth daily.     . Cholecalciferol (VITAMIN D3) 25 MCG (1000 UT) CAPS Take 1,000 Units by mouth daily.    Marland Kitchen dexlansoprazole (DEXILANT) 60 MG capsule Take 60 mg by mouth daily.    . diphenhydrAMINE (BENADRYL) 50 MG capsule Take  50 mg by mouth as needed. Take 1 one hour prior to CT    . hydrochlorothiazide (HYDRODIURIL) 25 MG tablet Take 25 mg by mouth every other day.     . latanoprost (XALATAN) 0.005 % ophthalmic solution Place 1 drop into both eyes at bedtime.     . lidocaine (LIDODERM) 5 % Place 1 patch onto the skin daily. Remove & Discard patch within 12 hours or as directed by MD 30 patch 0  . Multiple Vitamins-Minerals (CENTRUM SILVER 50+WOMEN PO) Take 1 tablet by mouth daily.    . nadolol (CORGARD) 20 MG tablet Take 1 tablet (20 mg total) by mouth daily. 90 tablet 3  . Omega-3 Fatty Acids (FISH OIL PO) Take 1 capsule by mouth daily.    . polycarbophil (FIBERCON) 625 MG tablet Take 625 mg by mouth daily.    . polyethylene glycol (MIRALAX / GLYCOLAX) packet Take 17 g by mouth daily.    . sucralfate (CARAFATE) 1 GM/10ML suspension Take 10 mLs (1 g total) by mouth 4 (four) times daily -  with meals and at bedtime. 420 mL 0  . predniSONE (DELTASONE) 50 MG tablet TAKE 1 TABLET 13 hours prior to test, another 7 hours prior to test, AND another 1 hour prior to test. 3 tablet 0   No current facility-administered medications for this visit.     Allergies:   Atracurium & derivatives; Atropine; Iodine; and Shellfish allergy    Social History:  The patient  reports that she has never smoked. She has never used smokeless tobacco. She reports previous alcohol use. She reports previous drug use.   Family History:  [name] family history includes Alcohol abuse in her father; Arthritis in her brother, sister, sister, and sister; Asthma in her sister; Colon polyps in her brother; Congenital heart disease in her brother; Diabetes in her brother; Glaucoma in her brother; Hyperlipidemia in her brother; Hypertension in her brother and sister; Pancreatic cancer in her mother; Prostate cancer in her brother.    ROS:  Please see the history of present illness.   Otherwise, review of systems are positive for none.   All other  systems are reviewed and negative.    PHYSICAL EXAM: VS:  BP 120/72 (BP Location: Right Arm)   Pulse 70   Ht 5\' 3"  (1.6 m)   Wt 149 lb (67.6 kg)   BMI 26.39 kg/m  , BMI Body mass index is 26.39 kg/m. GENERAL:  Well appearing HEENT:  Pupils equal round and reactive, fundi not visualized, oral mucosa unremarkable NECK:  No jugular venous distention, waveform within normal limits, carotid upstroke brisk and symmetric, no bruits, no thyromegaly LYMPHATICS:  No cervical adenopathy LUNGS:  Clear to auscultation bilaterally HEART:  RRR.  PMI not displaced or sustained,S1 and S2 within normal limits,  no S3, no S4, no clicks, no rubs, no murmurs ABD:  Flat, positive bowel sounds normal in frequency in pitch, no bruits, no rebound, no guarding, no midline pulsatile mass, no hepatomegaly, no splenomegaly EXT:  2 plus pulses throughout, no edema, no cyanosis no clubbing SKIN:  No rashes no nodules NEURO:  Cranial nerves II through XII grossly intact, motor grossly intact throughout PSYCH:  Cognitively intact, oriented to person place and time   EKG:  EKG is ordered today. The ekg ordered today demonstrates sinus rhythm.  Rate 70 bpm.  Non-specific ST changes.    Recent Labs: 05/27/2018: TSH 1.21 07/10/2018: ALT 20; BUN 14; Creatinine, Ser 0.73; Hemoglobin 12.9; Platelets 234; Potassium 3.6; Sodium 139    Lipid Panel    Component Value Date/Time   CHOL 270 (H) 05/27/2018 1045   TRIG 191.0 (H) 05/27/2018 1045   HDL 54.70 05/27/2018 1045   CHOLHDL 5 05/27/2018 1045   VLDL 38.2 05/27/2018 1045   LDLCALC 177 (H) 05/27/2018 1045      Wt Readings from Last 3 Encounters:  07/13/18 149 lb (67.6 kg)  06/30/18 150 lb (68 kg)  06/03/18 150 lb 6.1 oz (68.2 kg)      ASSESSMENT AND PLAN:  # Atypical chest pain: # Hyperlipidemia: Symptoms were very atypical for ischemia and more consistent with GERD.  She has no exertional symptoms and reportedly had a normal stress test less than 1 year  ago.  We will get a copy of these records.  She has elevated lipids and risk factors including hyperlipidemia and hypertension.  We will get a coronary CT-A, which will also help guide the need for lipid therapy.  LDL was 177 on 05/2018. She was given a GoodRX coupon to help her fill her prescription for Carafate.    # Hypertension: Blood pressure is well-controlled on hydrochlorothiazide and nadolol.  # Mitral valve prolapse: There is no mention of mitral valve prolapse on her last 2 echocardiograms.  She had only trivial mitral irritation.  There is no evidence of heart failure on exam.  No need to repeat echocardiogram at this time.  # GERD: #  Current medicines are reviewed at length with the patient today.  The patient does not have concerns regarding medicines.  The following changes have been made:  no change  Labs/ tests ordered today include:   Orders Placed This Encounter  Procedures  . CT CORONARY MORPH W/CTA COR W/SCORE W/CA W/CM &/OR WO/CM  . CT CORONARY FRACTIONAL FLOW RESERVE DATA PREP  . CT CORONARY FRACTIONAL FLOW RESERVE FLUID ANALYSIS  . EKG 12-Lead     Disposition:   FU with Jany Buckwalter C. Oval Linsey, MD, [facility] in 3 months.      Signed, Cortney Beissel C. Oval Linsey, MD, [facility]  07/13/2018 6:18 PM    [address]

## 2018-07-13 NOTE — Patient Instructions (Addendum)
Medication Instructions:  PRIOR TO YOUR CT YOU WILL NEED TO TAKE THE BELOW AS PRESCRIBED  1. Prednisone 50 mg - take 13 hours prior to test 2. Take another Prednisone 50 mg 7 hours prior to test 3. Take another Prednisone 50 mg 1 hour prior to test 4. Take Benadryl 50 mg 1 hour prior to test . Patient must complete all four doses of above prophylactic medications. . Patient will need a ride after test due to Benadryl.  If you need a refill on your cardiac medications before your next appointment, please call your pharmacy.   Lab work: NONE  Testing/Procedures: Your physician has requested that you have cardiac CT. Cardiac computed tomography (CT) is a painless test that uses an x-ray machine to take clear, detailed pictures of your heart. For further information please visit [facility]uge[facility]iest[employer]tn. Please follow instruction sheet as given. THE OFFICE WILL CALL YOU ONCE YOUR INSURANCE HAS APPROVED TO GET THIS SCHEDULED IF YOU DO NOT HEAR IN 2 WEEKS CALL [phone]  Follow-Up: At [facility], you and your health needs are our priority.  As part of our continuing mission to provide you with exceptional heart care, we have created designated Provider Care Teams.  These Care Teams include your primary Cardiologist (physician) and Advanced Practice Providers (APPs -  Physician Assistants and Nurse Practitioners) who all work together to provide you with the care you need, when you need it. You will need a follow up appointment in 3 months.  You may see DR [facility]  or one of the following Advanced Practice Providers on your designated Care Team:   Kerin Ransom, PA-C Roby Lofts, [place] . Sande Rives, PA-C  Any Other Special Instructions Will Be Listed Below (If Applicable).  Please arrive at the [facility] main entrance of [facility] at xx:xx AM (30-45 minutes prior to test start time)  [facility] [address], [place] [address] [phone]  Proceed to the [facility] Radiology Department (First Floor).  Please follow these instructions carefully (unless otherwise directed):  On the Night Before the Test: . Be sure to Drink plenty of water. . Do not consume any caffeinated/decaffeinated beverages or chocolate 12 hours prior to your test. . Do not take any antihistamines 12 hours prior to your test. . If you take Metformin do not take 24 hours prior to test. . If the patient has contrast allergy: ? Patient will need a prescription for Prednisone and very clear instructions (as follows): 1. Prednisone 50 mg - take 13 hours prior to test 2. Take another Prednisone 50 mg 7 hours prior to test 3. Take another Prednisone 50 mg 1 hour prior to test 4. Take Benadryl 50 mg 1 hour prior to test . Patient must complete all four doses of above prophylactic medications. . Patient will need a ride after test due to Benadryl.  On the Day of the Test: . Drink plenty of water. Do not drink any water within one hour of the test. . Do not eat any food 4 hours prior to the test. . You may take your regular medications prior to the test.  . Take metoprolol (Lopressor) two hours prior to test. . HOLD Furosemide/Hydrochlorothiazide morning of the test.  Do not give Lopressor to patients with an allergy to lopressor or anyone with asthma or active COPD symptoms (currently taking steroids).       After the Test: . Drink plenty of water. . After receiving IV contrast, you  may experience a mild flushed feeling. This is normal. . On occasion, you may experience a mild rash up to 24 hours after the test. This is not dangerous. If this occurs, you can take Benadryl 25 mg and increase your fluid intake. . If you experience trouble breathing, this can be serious. If it is severe call 911 IMMEDIATELY. If it is mild, please call our office. . If you take any of these medications: Glipizide/Metformin, Avandament, Glucavance, please do not take  48 hours after completing test.    Cardiac CT Angiogram  A cardiac CT angiogram is a procedure to look at the heart and the area around the heart. It may be done to help find the cause of chest pains or other symptoms of heart disease. During this procedure, a large X-ray machine, called a CT scanner, takes detailed pictures of the heart and the surrounding area after a dye (contrast material) has been injected into blood vessels in the area. The procedure is also sometimes called a coronary CT angiogram, coronary artery scanning, or CTA. A cardiac CT angiogram allows the health care provider to see how well blood is flowing to and from the heart. The health care provider will be able to see if there are any problems, such as:  Blockage or narrowing of the coronary arteries in the heart.  Fluid around the heart.  Signs of weakness or disease in the muscles, valves, and tissues of the heart. Tell a health care provider about:  Any allergies you have. This is especially important if you have had a previous allergic reaction to contrast dye.  All medicines you are taking, including vitamins, herbs, eye drops, creams, and over-the-counter medicines.  Any blood disorders you have.  Any surgeries you have had.  Any medical conditions you have.  Whether you are pregnant or may be pregnant.  Any anxiety disorders, chronic pain, or other conditions you have that may increase your stress or prevent you from lying still. What are the risks? Generally, this is a safe procedure. However, problems may occur, including:  Bleeding.  Infection.  Allergic reactions to medicines or dyes.  Damage to other structures or organs.  Kidney damage from the dye or contrast that is used.  Increased risk of cancer from radiation exposure. This risk is low. Talk with your health care provider about: ? The risks and benefits of testing. ? How you can receive the lowest dose of radiation. What happens  before the procedure?  Wear comfortable clothing and remove any jewelry, glasses, dentures, and hearing aids.  Follow instructions from your health care provider about eating and drinking. This may include: ? For 12 hours before the test - avoid caffeine. This includes tea, coffee, soda, energy drinks, and diet pills. Drink plenty of water or other fluids that do not have caffeine in them. Being well-hydrated can prevent complications. ? For 4-6 hours before the test - stop eating and drinking. The contrast dye can cause nausea, but this is less likely if your stomach is empty.  Ask your health care provider about changing or stopping your regular medicines. This is especially important if you are taking diabetes medicines, blood thinners, or medicines to treat erectile dysfunction. What happens during the procedure?  Hair on your chest may need to be removed so that small sticky patches called electrodes can be placed on your chest. These will transmit information that helps to monitor your heart during the test.  An IV tube will be inserted  into one of your veins.  You might be given a medicine to control your heart rate during the test. This will help to ensure that good images are obtained.  You will be asked to lie on an exam table. This table will slide in and out of the CT machine during the procedure.  Contrast dye will be injected into the IV tube. You might feel warm, or you may get a metallic taste in your mouth.  You will be given a medicine (nitroglycerin) to relax (dilate) the arteries in your heart.  The table that you are lying on will move into the CT machine tunnel for the scan.  The person running the machine will give you instructions while the scans are being done. You may be asked to: ? Keep your arms above your head. ? Hold your breath. ? Stay very still, even if the table is moving.  When the scanning is complete, you will be moved out of the machine.  The IV  tube will be removed. The procedure may vary among health care providers and hospitals. What happens after the procedure?  You might feel warm, or you may get a metallic taste in your mouth from the contrast dye.  You may have a headache from the nitroglycerin.  After the procedure, drink water or other fluids to wash (flush) the contrast material out of your body.  Contact a health care provider if you have any symptoms of allergy to the contrast. These symptoms include: ? Shortness of breath. ? Rash or hives. ? A racing heartbeat.  Most people can return to their normal activities right after the procedure. Ask your health care provider what activities are safe for you.  It is up to you to get the results of your procedure. Ask your health care provider, or the department that is doing the procedure, when your results will be ready. Summary  A cardiac CT angiogram is a procedure to look at the heart and the area around the heart. It may be done to help find the cause of chest pains or other symptoms of heart disease.  During this procedure, a large X-ray machine, called a CT scanner, takes detailed pictures of the heart and the surrounding area after a dye (contrast material) has been injected into blood vessels in the area.  Ask your health care provider about changing or stopping your regular medicines before the procedure. This is especially important if you are taking diabetes medicines, blood thinners, or medicines to treat erectile dysfunction.  After the procedure, drink water or other fluids to wash (flush) the contrast material out of your body. This information is not intended to replace advice given to you by your health care provider. Make sure you discuss any questions you have with your health care provider. Document Released: 05/16/2008 Document Revised: 04/22/2016 Document Reviewed: 04/22/2016 Elsevier Interactive Patient Education  2019 [employer].

## 2018-07-15 ENCOUNTER — Telehealth: Payer: Self-pay | Admitting: Gastroenterology

## 2018-07-15 ENCOUNTER — Telehealth: Payer: Self-pay | Admitting: Family Medicine

## 2018-07-15 ENCOUNTER — Ambulatory Visit: Payer: Self-pay

## 2018-07-15 NOTE — Telephone Encounter (Deleted)
  Reason for Disposition . Caller has medication question only, adult not sick, and triager answers question  Protocols used: MEDICATION QUESTION CALL-A-AH  

## 2018-07-15 NOTE — Telephone Encounter (Signed)
See note and advise on when patient can be worked in  Copied from [place]. Topic: Appointment Scheduling - Scheduling Inquiry for Clinic >> Jul 15, 2018  3:29 PM Sheran Luz wrote: Reason for CRM: Patient called to schedule a ED follow up visit with Dr. Juleen China for sooner than next available OV. Patient is requesting to come in as soon as possible; Patient is aware that Dr. Juleen China will be returning on Friday and states she can wait until then. Please advise.

## 2018-07-15 NOTE — Telephone Encounter (Signed)
Pt was given a script for carafate in the ER and lidocaine patches. Pt wants to know if those meds are ok to take along with the dexilant. Discussed with her that they are fine to take. She verbalized understanding.

## 2018-07-15 NOTE — Telephone Encounter (Signed)
Patient called and advised to call the pharmacist or the providers who prescribed the medications she has questions about interacting with each other. She says she is wondering about Nadolol, Carafate, Dexilant and others. She verbalized understanding.    Summary: med question    Patient is requesting a call back from nurse to discuss if medications she was recently prescribed were safe to take with her other medications; specifically the nadolol (CORGARD) 20 MG tablet. Patient states she is supposed to take medication today but is unsure if there are any interactions she should be aware of. Please advise.      Reason for Disposition . Caller has medication question only, adult not sick, and triager answers question  Protocols used: MEDICATION QUESTION CALL-A-AH

## 2018-07-15 NOTE — Telephone Encounter (Signed)
When do you want me to work in

## 2018-07-16 ENCOUNTER — Other Ambulatory Visit: Payer: Self-pay | Admitting: *Deleted

## 2018-07-16 ENCOUNTER — Telehealth: Payer: Self-pay | Admitting: Family Medicine

## 2018-07-16 NOTE — Telephone Encounter (Signed)
Monday would be my first available. Could see another person.

## 2018-07-16 NOTE — Patient Outreach (Signed)
[facility]) Care Management  07/16/2018  TEIGHLOR KORSON Oct 20, 1945 [id number]   Telephone Screen  Referral Date: 07/16/2018 Referral Source: [facility] referral -Sandria Senter Referral Reason: "[facility] member was seen in ED on 07/10/2018 for pain and numbness in her left arm also stomach issues. Member was unable to pick up Rx  Insurance:[employer] medicare advantage    Outreach attempt # 1 successful to her home Patient is able to verify HIPAA Reviewed and addressed referral to [facility] with patient Mrs Edgren confirms medication cost concerns  Appointments: 08/13/27 ED follow up with primary MD Dr Juleen China and placed on cancellation list Last saw primary MD in 05/2018   When asked about medical and educational needs she began to inquire about supplemental insurance  CM answered questions about supplement coverage when Mrs Mikkelson inquired     Social: Mrs Locatelli lives at home with her spouse. She reports they recently moved to [facility] from [place] She is independent with all care needs She denies transportation issues with getting to medical appointments    Conditions: HTN, aortic atherosclerosis, MVP, GERD, barrett's esophagus, glaucoma, elevated lipase, hiatal hernia, constipation, situational anxiety, sleep disordered breathing, HDL   Medications: Mrs Plourde reports she has already paid $168 for Carafate 1 gm/10 ml suspension qid with meals and at bedtime recently but did not purchase lidocaine/Lidoderm patch -1 to skin daily at a cost of $300 She reports also that she is await for united healthcare to approve the lidocaine She reports that if the cost will remain $300 she will obtain tylenol as also suggested by ED MD  Nadolol and dexilant are reported to be medications that she has cost concerns with also [facility] RN CM discussed with Mrs Lagoy the option of having the prescribing MD contacted by the local pharmacy staff filing the prescribed medicine for  other options of medication -prescribing a different medication [facility] RN CM reviewed goodrx after she inquired about different costs at different pharmacies  CM inquired about her annual insurance deductible but she is not sure of her deductible amount "I don't understand a lot about insurance" She reports a pending copy of her policy sent by united [employer] care via mail She reports she went online to attempt to find information but is not good at using the Internet   Advance Directives: Denies need for assist with advance directives    Consent: [facility] RN CM reviewed [facility] services with patient. Patient gave verbal consent for services. She denies need of services from [place] CM, , health coach, NP or SW at this time   Plan: Russell will refer Mrs dore to [place] for medication assistance with Dexilant, Nadolol, Lidocaine and Carafate. Plus questions about her medications  Brett Soza L. Lavina Hamman, RN, BSN, [place] Coordinator Office number [phone] Mobile number [phone]  Main [facility] number [phone] Fax number [phone]

## 2018-07-16 NOTE — Telephone Encounter (Signed)
Please advise when patient can be worked in  Copied from [place]. Topic: Appointment Scheduling - Scheduling Inquiry for Clinic >> Jul 16, 2018  1:41 PM Yvette Rack wrote: Reason for CRM: Pt called in to schedule an ED follow up appt with Dr. Juleen China however the first available appt is for 08/12/18. Pt requests to be worked in for a sooner appt. Pt requests call back.

## 2018-07-17 ENCOUNTER — Other Ambulatory Visit: Payer: Self-pay | Admitting: Pharmacist

## 2018-07-17 NOTE — Patient Outreach (Signed)
[facility]) Care Management  07/17/2018  Terri Barajas Sep 06, 1945 [id number]   Patient was called regarding medication review and assistance. Unfortunately, she did not answer her phone. HIPAA compliant  Message was left on her voicemail.  The referrral stated the patient was having issues with the cost of Dexilant, Nadolol, Lidocaine, and Sucralfate  All four medications are tier 4 medications on the patient's insurance.  [place] Medicare Complete Plan 2  Plan Details:  Annual Prescription Deductible  $0 for Tier 1, Tier 2  $95 for Tier 3, Tier 4, Tier 5   Tier 1: Preferred Generic Drugs  Lawyer (30 days)  $2 copay [facility] Pharm[facility] (90 days) $0 copay [facility] (90 days) $6 copay  Tier 2: Generic Drugs  Lawyer (30 days)  $8 copay [facility] (90 days) $0 copay [facility] (90 days) $24 copay  Tier 3: Preferred Brand Drugs  Lawyer (30 days)  $47 copay [facility] (90 days) $131 copay [facility] (90 days) $141 copay   Tier 4: Non-Preferred Drugs  Lawyer (30 days)  $100 copay Preferred [facility] Order Pharmacy (90 days) $290 copay [facility] (90 days) $300 copay Tier 5: [place] [facility] (30 days)  31% of the cost Preferred Mail Order Pharmacy (90 days) 31% of the cost [facility] (90 days) 31% of the cost   Plan: Send patient an unsuccessful contact letter. Call patient back in 3-5 business days.   [name], PharmD, [place] Clinical Pharmacist [phone]

## 2018-07-17 NOTE — Telephone Encounter (Signed)
Called patient l/m to call office  

## 2018-07-20 ENCOUNTER — Telehealth: Payer: Self-pay | Admitting: Family Medicine

## 2018-07-20 NOTE — Telephone Encounter (Signed)
See note  Copied from [place] [phone]. Topic: Quick Communication - See Telephone Encounter >> Jul 17, 2018  5:03 PM Blase Mess A wrote: CRM for notification. See Telephone encounter for: 07/17/18.  Patient is calling back to return Sheria Lang VM

## 2018-07-20 NOTE — Telephone Encounter (Signed)
Called patient app made for tomorrow pt informed will call if any changes.

## 2018-07-21 ENCOUNTER — Encounter: Payer: Self-pay | Admitting: Family Medicine

## 2018-07-21 ENCOUNTER — Ambulatory Visit (INDEPENDENT_AMBULATORY_CARE_PROVIDER_SITE_OTHER): Payer: Medicare Other | Admitting: Family Medicine

## 2018-07-21 VITALS — BP 114/80 | HR 69 | Temp 98.0°F | Ht 63.0 in | Wt 146.2 lb

## 2018-07-21 DIAGNOSIS — K2270 Barrett's esophagus without dysplasia: Secondary | ICD-10-CM | POA: Diagnosis not present

## 2018-07-21 DIAGNOSIS — K219 Gastro-esophageal reflux disease without esophagitis: Secondary | ICD-10-CM | POA: Diagnosis not present

## 2018-07-21 DIAGNOSIS — M5410 Radiculopathy, site unspecified: Secondary | ICD-10-CM | POA: Diagnosis not present

## 2018-07-21 DIAGNOSIS — R140 Abdominal distension (gaseous): Secondary | ICD-10-CM

## 2018-07-21 MED ORDER — METOCLOPRAMIDE HCL 5 MG PO TABS: 5.0000 mg | ORAL_TABLET | Freq: Three times a day (TID) | ORAL | 1 refills | Status: DC

## 2018-07-21 NOTE — Patient Instructions (Addendum)
I think that you may have gastroparesis. Okay to hold the carafate. Start Reglan, up to 4 times per day.  You have a pinched nerve in your neck. Lets see if PT helps. Otherwise, I can get you to a specialist to consider injections. Lidocaine 4% patches are over the counter.

## 2018-07-21 NOTE — Progress Notes (Signed)
Terri Barajas is a 73 y.o. female is here for follow up.  History of Present Illness:   HPI: Follow up ED visit. Patient and husband frustrated that no answers given for symptoms. Interventions cost-prohibitive.   Abdominal Distention Continues. Losing weight. Feels bloating every time she eats, even very small meals. Bowels moving now. EGD recently below. Biopsies reassuring. Followed by GI.   Wt Readings from Last 3 Encounters:  07/21/18 146 lb 3.2 oz (66.3 kg)  07/13/18 149 lb (67.6 kg)  06/30/18 150 lb (68 kg)   CT ABD/PELVIS 01/2018 1. Proximal to mid transverse colon area of mild circumferential narrowing may be related to under distension/peristalsis. Subtle mass cannot be excluded (series 5, image 18). 2. Under distended gastric fundus limits evaluation otherwise no gastric abnormality noted. 3. No extraluminal bowel inflammatory process 4. Aortic Atherosclerosis (ICD10-I70.0). Trace aortic calcifications noted.  EGD on 07/06/18 1. Normal mucosa was found in the proximal esophagus, in the mid esophagus and in the distal esophagus. Biopsied for EoE. 2. Z-line irregular, 37 cm from the incisors - previously biopsied elsewhere with Barrett's esophagus. Biopsied to understand possible dysplasia. 3. Multiple gastric polyps - likely fundic gland polyps. Biopsied/Sampled a few. 4. Non-bleeding erosive gastropathy. 5. A single enlarged gastric fold with erosions - likely reactive change. Biopsied for adenoma evaluation. 6. No other gross lesions in the stomach. Biopsied for HP. 7. No gross lesions in the duodenal bulb, in the first portion of the duodenum and in the second portion of the duodenum. Biopsied for Enteropathy/Celiac rule out.  Left arm ache, with numbness and tingling in fingers. With some neck pain. Cardiac etiology R/O at ED. Rx Lidocaine patch. Cervical spine xray showed multilevel degenerative changes.   Health Maintenance Due  Topic Date Due  . TETANUS/TDAP   10/21/1966  . MAMMOGRAM  10/20/1997  . DEXA SCAN  10/20/2012  . PNA vac Low Risk Adult (2 of 2 - PCV13) 06/09/2018   Depression screen [facility] 2/9 07/21/2018 07/16/2018 05/27/2018  Decreased Interest 2 0 0  Down, Depressed, Hopeless 2 0 0  PHQ - 2 Score 4 0 0  Altered sleeping 0 - 0  Tired, decreased energy 0 - 0  Change in appetite 1 - 1  Feeling bad or failure about yourself  1 - 0  Trouble concentrating 0 - 0  Moving slowly or fidgety/restless 0 - 0  Suicidal thoughts 0 - 0  PHQ-9 Score 6 - 1  Difficult doing work/chores Somewhat difficult - Not difficult at all   PMHx, SurgHx, SocialHx, FamHx, Medications, and Allergies were reviewed in the Visit Navigator and updated as appropriate.   Patient Active Problem List   Diagnosis Date Noted  . Hyperlipidemia 05/13/2018  . Situational anxiety 04/29/2018  . MVP (mitral valve prolapse) 04/29/2018  . Sleep-disordered breathing 04/29/2018  . Family History of Hyperthermia, malignant 04/23/2018  . Barrett's esophagus 03/01/2018  . Elevated lipase 02/18/2018  . Hiatal hernia 02/18/2018  . Constipation 02/18/2018  . Aortic atherosclerosis ([place]) 01/23/2018  . GERD (gastroesophageal reflux disease)   . Glaucoma   . Hypertension    Social History   Tobacco Use  . Smoking status: Never Smoker  . Smokeless tobacco: Never Used  Substance Use Topics  . Alcohol use: Not Currently  . Drug use: Not Currently   Current Medications and Allergies   .  ALPRAZolam (XANAX) 0.5 MG tablet, Take 0.25 mg by mouth daily as needed for anxiety. , Disp: , Rfl:  .  aspirin 81 MG chewable tablet, Chew 81 mg by mouth daily., Disp: , Rfl:  .  BIOTIN PO, Take 1 tablet by mouth daily. , Disp: , Rfl:  .  Cholecalciferol (VITAMIN D3) 25 MCG (1000 UT) CAPS, Take 1,000 Units by mouth daily., Disp: , Rfl:  .  dexlansoprazole (DEXILANT) 60 MG capsule, Take 60 mg by mouth daily., Disp: , Rfl:  .  diphenhydrAMINE (BENADRYL) 50 MG capsule, Take 50 mg by mouth as  needed. Take 1 one hour prior to CT, Disp: , Rfl:  .  latanoprost (XALATAN) 0.005 % ophthalmic solution, Place 1 drop into both eyes at bedtime. , Disp: , Rfl:  .  lidocaine (LIDODERM) 5 %, Place 1 patch onto the skin daily. Remove & Discard patch within 12 hours or as directed by MD, Disp: 30 patch, Rfl: 0 .  Multiple Vitamins-Minerals (CENTRUM SILVER 50+WOMEN PO), Take 1 tablet by mouth daily., Disp: , Rfl:  .  nadolol (CORGARD) 20 MG tablet, Take 1 tablet (20 mg total) by mouth daily., Disp: 90 tablet, Rfl: 3 .  Omega-3 Fatty Acids (FISH OIL PO), Take 1 capsule by mouth daily., Disp: , Rfl:  .  polycarbophil (FIBERCON) 625 MG tablet, Take 625 mg by mouth daily., Disp: , Rfl:  .  polyethylene glycol (MIRALAX / GLYCOLAX) packet, Take 17 g by mouth daily., Disp: , Rfl:  .  sucralfate (CARAFATE) 1 GM/10ML suspension, Take 10 mLs (1 g total) by mouth 4 (four) times daily -  with meals and at bedtime., Disp: 420 mL, Rfl: 0 .  hydrochlorothiazide (HYDRODIURIL) 25 MG tablet, Take 25 mg by mouth every other day. , Disp: , Rfl:    Allergies  Allergen Reactions  . Atracurium & Derivatives Other (See Comments)    Unknown  . Atropine Other (See Comments)    Heart racing  . Iodine Other (See Comments)    Unknown  . Shellfish Allergy Other (See Comments)    Unknown  . Statins    Review of Systems   Pertinent items are noted in the HPI. Otherwise, a complete ROS is negative.  Vitals   Vitals:   07/21/18 1119  BP: 114/80  Pulse: 69  Temp: 98 F (36.7 C)  TempSrc: Oral  SpO2: 97%  Weight: 146 lb 3.2 oz (66.3 kg)  Height: 5\' 3"  (1.6 m)     Body mass index is 25.9 kg/m.  Physical Exam   Physical Exam Vitals signs and nursing note reviewed.  Constitutional:      General: She is not in acute distress.    Appearance: Normal appearance.  HENT:     Head: Normocephalic and atraumatic.     Mouth/Throat:     Mouth: Mucous membranes are moist.  Eyes:     Pupils: Pupils are equal, round,  and reactive to light.  Neck:     Musculoskeletal: Normal range of motion and neck supple.  Cardiovascular:     Rate and Rhythm: Normal rate and regular rhythm.     Heart sounds: Normal heart sounds.  Pulmonary:     Effort: Pulmonary effort is normal.  Abdominal:     General: There is distension.     Palpations: Abdomen is soft.     Tenderness: There is abdominal tenderness in the epigastric area.  Musculoskeletal:     Cervical back: She exhibits decreased range of motion and pain.  Skin:    General: Skin is warm.  Neurological:     General: No focal deficit present.  Mental Status: She is alert.  Psychiatric:        Mood and Affect: Mood normal.        Behavior: Behavior normal.     Assessment and Plan   Laurene was seen today for follow-up.  Diagnoses and all orders for this visit:  Abdominal bloating Comments: See AVS.  Orders: -     metoCLOPramide (REGLAN) 5 MG tablet; Take 1 tablet (5 mg total) by mouth 4 (four) times daily -  before meals and at bedtime.  Barrett's esophagus without dysplasia  Gastroesophageal reflux disease without esophagitis  Radiculopathy affecting upper extremity, left Comments: See AVS.    . Orders and follow up as documented in [place], reviewed diet, exercise and weight control, cardiovascular risk and specific lipid/LDL goals reviewed, reviewed medications and side effects in detail.  . Reviewed expectations re: course of current medical issues. . Outlined signs and symptoms indicating need for more acute intervention. . Patient verbalized understanding and all questions were answered. . Patient received an After Visit Summary.  Briscoe Deutscher, DO [place], [place] 07/25/2018

## 2018-07-22 ENCOUNTER — Ambulatory Visit (INDEPENDENT_AMBULATORY_CARE_PROVIDER_SITE_OTHER): Payer: Medicare Other | Admitting: Neurology

## 2018-07-22 DIAGNOSIS — R7302 Impaired glucose tolerance (oral): Secondary | ICD-10-CM

## 2018-07-22 DIAGNOSIS — I10 Essential (primary) hypertension: Secondary | ICD-10-CM

## 2018-07-22 DIAGNOSIS — G4733 Obstructive sleep apnea (adult) (pediatric): Secondary | ICD-10-CM | POA: Diagnosis not present

## 2018-07-22 DIAGNOSIS — R0683 Snoring: Secondary | ICD-10-CM

## 2018-07-23 ENCOUNTER — Other Ambulatory Visit: Payer: Self-pay | Admitting: Pharmacist

## 2018-07-23 ENCOUNTER — Ambulatory Visit: Payer: Self-pay | Admitting: Pharmacist

## 2018-07-23 NOTE — Patient Outreach (Addendum)
[facility]) Care Management  07/23/2018  ODILIA DAMICO 1945-11-14 [id number]    Patient was called regarding medication assistance. She answered the phone but said she could not talk because she was headed to a doctor's appointment.  The referrral stated the patient was having issues with the cost of Dexilant, Nadolol, Lidocaine, and Sucralfate  All four medications are tier 4 medications on the patient's insurance.  [place] Medicare Complete Plan 2  Plan Details:  Annual Prescription Deductible  $0 for Tier 1, Tier 2  $95 for Tier 3, Tier 4, Tier 5   Tier 1: Preferred Generic Drugs       Lawyer (30 days)  $2 copay Preferred Mail Order Pharmacy (90 days) $0 copay [facility] (90 days) $6 copay  Tier 2: Generic Drugs            Lawyer (30 days)  $8 copay Preferred [facility] Order Pharm[facility] (90 days) $0 copay [facility] (90 days) $24 copay  Tier 3: Preferred Brand Drugs          Lawyer (30 days)  $47 copay Pref[facility] Pharm[facility] (90 days) $131 copay [facility] (90 days) $141 copay   Tier 4: Non-Preferred Drugs             Lawyer (30 days)  $100 copay Preferred Mail Order Pharmacy (90 days) $290 copay [facility] (90 days) $300 copay Tier 5: [place] [facility] (30 days)  31% of the cost Preferred Mail Order Pharmacy (90 days) 31% of the cost [facility] (90 days) 31% of the cost   Plan: Call patient back in 3-5 business days. Try to get her again today.  [name], PharmD, [place] Clinical Pharmacist [phone]

## 2018-07-25 ENCOUNTER — Encounter: Payer: Self-pay | Admitting: Family Medicine

## 2018-07-25 DIAGNOSIS — M5410 Radiculopathy, site unspecified: Secondary | ICD-10-CM | POA: Insufficient documentation

## 2018-07-27 ENCOUNTER — Other Ambulatory Visit: Payer: Self-pay | Admitting: Pharmacy Technician

## 2018-07-27 ENCOUNTER — Other Ambulatory Visit: Payer: Self-pay | Admitting: Pharmacist

## 2018-07-27 NOTE — Patient Outreach (Signed)
Terri Barajas) Care Management  07/27/2018  Terri Barajas 12/30/1945 [id number]                                                   Medication Assistance Referral  Referral From: Terri Barajas  Medication/Company: Terri Barajas Patient application portion:  Mailed Provider application portion: Faxed  to Terri Barajas   Follow up:5-7 business days  Will follow up with patient in 5-7 business days to confirm application(s) have been received.  Terri Ting P. Terri Barajas, [place] Management [phone]

## 2018-07-27 NOTE — Patient Outreach (Addendum)
[name]) Care Management  07/27/2018  [place] [birth year] [id number]  [name]) Care Management  [place]   07/27/2018  Terri Barajas 04/29/1946 [id number]  Reason for referral: medication assistance  Referral source: Telephonic Terri Coder, RN Referral medication(s): Nadolol, Dexilant, Lidocaine, Sucralfate  Current insurance:[employer] [facility] Medicare Complete Plan 2)  HPI:  Aortic atherosclerosis, GERD, glaucoma, hiatal hernia, hyperlipidemia, hypertension, mitral valve prolapse, anxiety, and Barrett's esophagus  Patient saw Terri Barajas on 07/21/18 and sucralfate was discontinued. Patient has a CT scan next week.  Objective: Allergies  Allergen Reactions  . Atracurium & Derivatives Other (See Comments)    Unknown  . Atropine Other (See Comments)    Heart racing  . Iodine Other (See Comments)    Unknown  . Shellfish Allergy Other (See Comments)    Unknown  . Statins     "didn't feel right"    Medications Reviewed Today    Reviewed by Terri Barajas, [facility] (Pharmacist) on 07/27/18 at 1011  Med List Status: <None>  Medication Order Taking? Sig Documenting Provider Last Dose Status Informant  ALPRAZolam (XANAX) 0.5 MG tablet [id number] Yes Take 0.25 mg by mouth daily as needed for anxiety.  [provider] Taking Active Self  aspirin 81 MG chewable tablet [id number] Yes Chew 81 mg by mouth daily. [provider] Taking Active Self  BIOTIN PO [id number] Yes Take 1 tablet by mouth daily.  [provider] Taking Active Self  Cholecalciferol (VITAMIN D3) 25 MCG (1000 UT) CAPS [id number] Yes Take 1,000 Units by mouth daily. [provider] Taking Active Self  dexlansoprazole (DEXILANT) 60 MG capsule [id number] Yes Take 60 mg by mouth daily. [provider] Taking Active Self  diphenhydrAMINE (BENADRYL) 50 MG capsule [id number] Yes Take 50 mg by mouth as needed. Take 1  one hour prior to CT [provider] Taking Active   hydrochlorothiazide (HYDRODIURIL) 25 MG tablet [id number] Yes Take 25 mg by mouth every other day.  [provider] Taking Active Self  latanoprost (XALATAN) 0.005 % ophthalmic solution [id number] Yes Place 1 drop into both eyes at bedtime.  [provider] Taking Active Self  lidocaine (LIDODERM) 5 % [id number] No Place 1 patch onto the skin daily. Remove & Discard patch within 12 hours or as directed by MD  Patient not taking:  Reported on 07/27/2018   Terri Heidelberg, PA-C Not Taking Active            Med Note Terri Barajas   Tue Jul 21, 2018 11:19 AM) Pt currently not using.  Stated that insurance requires PA for this  metoCLOPramide (REGLAN) 5 MG tablet [id number] Yes Take 1 tablet (5 mg total) by mouth 4 (four) times daily -  before meals and at bedtime. Terri Deutscher, DO Taking Active   Multiple Vitamins-Minerals (CENTRUM SILVER 50+WOMEN PO) [id number] Yes Take 1 tablet by mouth daily. [provider] Taking Active Self  nadolol (CORGARD) 20 MG tablet [id number] Yes Take 1 tablet (20 mg total) by mouth daily. Terri Latch, MD Taking Active   Omega-3 Fatty Acids (FISH OIL PO) [id number] Yes Take 1 capsule by mouth daily. [provider] Taking Active Self  polycarbophil (FIBERCON) 625 MG tablet [id number] Yes Take 625 mg by mouth daily. [provider] Taking Active Self  polyethylene glycol Merril Abbe / GLYCOLAX) packet [id number] Yes Take 17 g by mouth daily. [provider] Taking Active   predniSONE (DELTASONE) 50 MG tablet  [id number]  TAKE 1 TABLET 13 hours prior to test, another 7 hours prior to test, AND another 1 hour prior to test. Terri Latch, MD  Active            Med Note Terri Barajas   Tue Jul 21, 2018 11:18 AM) Pt has not filled rx as of yet       Patient not taking:       Discontinued 07/27/18 1011 (Discontinued by provider)            Assessment:  Drugs sorted by system:  Neurologic/Psychologic: Alprazolam  Cardiovascular: Aspirin, Nadolol, Omega 3-Fatty Acid, HCTZ  Gastrointestinal: Dexilant, Metoclopramide, Fibercon, Polyethylene Glycol   Pain: Lidocaine (not taking)  Vitamins/Minerals/Supplements:\ Biotin, Cholecalciferol, Centrum Silver  Miscellaneous: Prednisone, Latanoprost, Diphenhydramine     Medication Assistance Findings:  Medication assistance needs identified.   Nadolol, Lidocaine patches and Dexilant are all tier 4 medications.  According to the patient's insurance:  All four medications are tier 4 medications on the patient's insurance.  Terri Barajas Medicare Complete Plan 2  Plan Details:  Annual Prescription Deductible  $0 for Tier 1, Tier 2  $95 for Tier 3, Tier 4, Tier 5   Tier 1: Preferred Generic Drugs       Lawyer (30 days)  $2 copay Preferred Mail Order Pharmacy (90 days) $0 copay [facility] (90 days) $6 copay  Tier 2: Generic Drugs            Lawyer (30 days)  $8 copay [facility]erred [facility] Pharm[facility] (90 days) $0 copay [facility] (90 days) $24 copay  Tier 3: Preferred Brand Drugs          Lawyer (30 days)  $47 copay Preferred [facility] Order Pharmacy (90 days) $131 copay [facility] (90 days) $141 copay   Tier 4: Non-Preferred Drugs             Lawyer (30 days)  $100 copay Preferred Mail Order Pharmacy (90 days) $290 copay [facility] (90 days) $300 copay  It may be prudent to pursue tier exceptions for Nadolol and Dexilant.  Lidocaine patches are usually not covered unless they have been prescribed for post herpetic neuralgia.    A note will be sent to the patient's providers about tier exceptions:  Terri Barajas- GI-  Dexilant Dr. Dorthula Barajas   In addition, Terri Barajas has a patient assistance program for Dexilant. Patient MAY qualify for their program to receive Dexilant at no cost provided she sends the required financial documentation.   Additional medication assistance options reviewed with patient as warranted:  Tier Exception request Provided patient with the phone number:     Patient also inquired about getting her husband set up with [facility]. (Investigation on self-referral will be done)  Plan: I will route patient assistance letter to [place] technician who will coordinate patient assistance program application process for medications listed above.  [facility] pharmacy technician will assist with obtaining all required documents from both patient and provider(s) and submit application(s) once completed.  Contact patient back in 2 weeks.    Terri Barajas, PharmD, [place] Clinical Pharmacist [phone]

## 2018-07-29 ENCOUNTER — Encounter: Payer: Self-pay | Admitting: Gastroenterology

## 2018-07-29 ENCOUNTER — Other Ambulatory Visit: Payer: Medicare Other

## 2018-07-29 ENCOUNTER — Ambulatory Visit: Payer: Medicare Other | Admitting: Gastroenterology

## 2018-07-29 VITALS — BP 120/78 | HR 65 | Ht 63.0 in | Wt 147.0 lb

## 2018-07-29 DIAGNOSIS — K2270 Barrett's esophagus without dysplasia: Secondary | ICD-10-CM

## 2018-07-29 DIAGNOSIS — R140 Abdominal distension (gaseous): Secondary | ICD-10-CM | POA: Diagnosis not present

## 2018-07-29 DIAGNOSIS — K219 Gastro-esophageal reflux disease without esophagitis: Secondary | ICD-10-CM

## 2018-07-29 NOTE — Progress Notes (Signed)
[place] VISIT   Primary Care Provider Briscoe Deutscher, [place] [address] [phone]  Patient Profile: Terri Barajas is a 73 y.o. female with a pmh significant for HTN, Glaucoma, MVP, GERD, Barrett's esophagus (negative on recent 2020 EGD), reported Hiatal Hernia.  The patient presents to the [facility] for an evaluation and management of problem(s) noted below:  Problem List 1. Bloating   2. Barrett's esophagus without dysplasia   3. Gastroesophageal reflux disease without esophagitis     History of Present Illness: Please see initial consultation note and follow-up note for full details of HPI.    Interval History: The patient returns for an unscheduled follow-up.  She was recently in the emergency department in the setting of chest pain discomfort that was associated with significant bloating and distention.  She was eventually discharged and told she did not have a cardiac etiology for her issues.  She is concerned about the bloating and the significant discomfort she had this time around.  She had been doing well with her FiberCon previously however has had her exacerbation.  We have previously done her upper endoscopy which was unremarkable and although previously noted to have Barrett's esophagus on biopsies of her regular Z line this was not consistent.  She otherwise had some reactive gastropathy but no other findings on her multiple biopsies.  We are going to transition the patient to [place] but she has had some issues with obtaining it.  [facility] coordinator from pharmacy who was going to try and help the patient with paperwork that needed to be filled out by the patient and by our office which has not been received as of yet.  She wishes that the bloating would just be away.  She has not been evaluated for bacterial overgrowth as of yet.  Her primary care provider reached out after hospitalization to  query whether a repeat colonoscopy would be necessary based on prior 2019 CT imaging suggested and under distended colon versus the possibility of a mass/lesion.  However she had a full colonoscopy in 2016 done prior to moving to this area.  GI Review of Systems Positive as above including medically controlled GERD Negative for odynophagia, nausea, vomiting, change in bowel habits, melena, hematochezia   Review of Systems General: Denies fevers/chills/weight loss Cardiovascular: Denies current chest pain Pulmonary: Denies shortness of breath Gastroenterological: See HPI Genitourinary: Denies darkened urine Hematological: Denies easy bruising/bleeding Psychological: Mood is anxious to get better   Medications Current Outpatient Medications  Medication Sig Dispense Refill  . ALPRAZolam (XANAX) 0.5 MG tablet Take 0.25 mg by mouth daily as needed for anxiety.     Marland Kitchen aspirin 81 MG chewable tablet Chew 81 mg by mouth daily.    Marland Kitchen BIOTIN PO Take 1 tablet by mouth daily.     . Cholecalciferol (VITAMIN D3) 25 MCG (1000 UT) CAPS Take 1,000 Units by mouth daily.    Marland Kitchen dexlansoprazole (DEXILANT) 60 MG capsule Take 60 mg by mouth daily.    . diphenhydrAMINE (BENADRYL) 50 MG capsule Take 50 mg by mouth as needed. Take 1 one hour prior to CT    . hydrochlorothiazide (HYDRODIURIL) 25 MG tablet Take 25 mg by mouth every other day.     . latanoprost (XALATAN) 0.005 % ophthalmic solution Place 1 drop into both eyes at bedtime.     . metoCLOPramide (REGLAN) 5 MG tablet Take 1 tablet (5 mg total) by mouth 4 (four) times daily -  before meals and at bedtime. 120 tablet 1  . Multiple Vitamins-Minerals (CENTRUM SILVER 50+WOMEN PO) Take 1 tablet by mouth daily.    . nadolol (CORGARD) 20 MG tablet Take 1 tablet (20 mg total) by mouth daily. 90 tablet 3  . Omega-3 Fatty Acids (FISH OIL PO) Take 1 capsule by mouth daily.    . polycarbophil (FIBERCON) 625 MG tablet Take 625 mg by mouth daily.    . polyethylene  glycol (MIRALAX / GLYCOLAX) packet Take 17 g by mouth daily.    . predniSONE (DELTASONE) 50 MG tablet TAKE 1 TABLET 13 hours prior to test, another 7 hours prior to test, AND another 1 hour prior to test. 3 tablet 0   No current facility-administered medications for this visit.    Allergies Allergies  Allergen Reactions  . Atracurium & Derivatives Other (See Comments)    Unknown  . Atropine Other (See Comments)    Heart racing  . Iodine Other (See Comments)    Unknown  . Shellfish Allergy Other (See Comments)    Unknown  . Statins     "didn't feel right"   Histories Past Medical History:  Diagnosis Date  . Anxiety   . Arthritis   . Complication of anesthesia    some type of sedation medication made her feel crazy  . Dysrhythmia   . Family history of adverse reaction to anesthesia    daughter has Malignant Hyperthermia hx -   . GERD (gastroesophageal reflux disease)   . Glaucoma   . H/O: hysterectomy 1978  . Heart disease   . History of hiatal hernia   . Hypertension   . MVP (mitral valve prolapse)   . Panic attacks   . Pre-diabetes    Past Surgical History:  Procedure Laterality Date  . APPENDECTOMY  1972  . BIOPSY  07/06/2018   Procedure: BIOPSY;  Surgeon: Rush Landmark Telford Nab., MD;  Location: [facility];  Service: Gastroenterology;;  . CATARACT EXTRACTION  2013  . Phil [place]  . COLONOSCOPY    . ESOPHAGOGASTRODUODENOSCOPY (EGD) WITH PROPOFOL N/A 07/06/2018   Procedure: ESOPHAGOGASTRODUODENOSCOPY (EGD) WITH PROPOFOL;  Surgeon: Rush Landmark Telford Nab., MD;  Location: [facility];  Service: Gastroenterology;  Laterality: N/A;  . HAND SURGERY Bilateral   . HEMORRHOID SURGERY  2018  . S/P Hysterectomy   1987   Social History   Socioeconomic History  . Marital status: Married    Spouse name: Not on file  . Number of children: Not on file  . Years of education: 71  . Highest education level: Not on file  Occupational History  . Not on  file  Social Needs  . Financial resource strain: Not on file  . Food insecurity:    Worry: Not on file    Inability: Not on file  . Transportation needs:    Medical: Not on file    Non-medical: Not on file  Tobacco Use  . Smoking status: Never Smoker  . Smokeless tobacco: Never Used  Substance and Sexual Activity  . Alcohol use: Not Currently  . Drug use: Not Currently  . Sexual activity: Yes    Partners: Male  Lifestyle  . Physical activity:    Days per week: Not on file    Minutes per session: Not on file  . Stress: Not on file  Relationships  . Social connections:    Talks on phone: Not on file    Gets together: Not on file  Attends religious service: Not on file    Active member of club or organization: Not on file    Attends meetings of clubs or organizations: Not on file    Relationship status: Not on file  . Intimate partner violence:    Fear of current or ex partner: Not on file    Emotionally abused: Not on file    Physically abused: Not on file    Forced sexual activity: Not on file  Other Topics Concern  . Not on file  Social History Narrative  . Not on file   Family History  Problem Relation Age of Onset  . Pancreatic cancer Mother   . Alcohol abuse Father   . Asthma Sister   . Hypertension Sister   . Arthritis Sister   . Diabetes Brother   . Arthritis Brother   . Hyperlipidemia Brother   . Hypertension Brother   . Glaucoma Brother   . Colon polyps Brother   . Arthritis Sister   . Arthritis Sister   . Prostate cancer Brother   . Congenital heart disease Brother   . Colon cancer Neg Hx   . Esophageal cancer Neg Hx   . Inflammatory bowel disease Neg Hx   . Liver disease Neg Hx   . Rectal cancer Neg Hx   . Stomach cancer Neg Hx    I have reviewed her medical, social, and family history in detail and updated the electronic medical record as necessary.    PHYSICAL EXAMINATION  BP 120/78   Pulse 65   Ht 5\' 3"  (1.6 m)   Wt 147 lb (66.7 kg)    LMP  (LMP Unknown)   BMI 26.04 kg/m  GEN: NAD, doesn't appear chronically ill, accompanied by husband PSYCH: Cooperative, without pressured speech EYE: Conjunctivae pink, sclerae anicteric ENT: MMM CV: RR without R/Gs  RESP: CTAB posteriorly GI: NABS, soft, mildly distended, mildly tympanic to percussion, nontender, without rebound or guarding, no HSM appreciated MSK/EXT: No lower extremity edema SKIN: No jaundice  NEURO:  Alert & Oriented x 3, no focal deficits   REVIEW OF DATA  I reviewed the following data at the time of this encounter:  GI Procedures and Studies  January 2020 upper endoscopy - Normal mucosa was found in the proximal esophagus, in the mid esophagus and in the distal esophagus. Biopsied for EoE. - Z-line irregular, 37 cm from the incisors - previously biopsied elsewhere with Barrett's esophagus. Biopsied to understand possible dysplasia. - Multiple gastric polyps - likely fundic gland polyps. Biopsied/Sampled a few. - Non-bleeding erosive gastropathy. - A single enlarged gastric fold with erosions - likely reactive change. Biopsied for adenoma evaluation. - No other gross lesions in the stomach. Biopsied for HP. - No gross lesions in the duodenal bulb, in the first portion of the duodenum and in the second portion of the duodenum. Biopsied for Enteropathy/Celiac rule out.  Laboratory Studies  Reviewed in EPIC  Imaging Studies  No new studies   ASSESSMENT  Terri Barajas is a 73 y.o. female with a pmh significant for HTN, Glaucoma, MVP, GERD, Barrett's esophagus (negative on recent 2020 EGD), reported Hiatal Hernia.  The patient is seen today for a return visit for evaluation and management of:  1. Bloating   2. Barrett's esophagus without dysplasia   3. Gastroesophageal reflux disease without esophagitis    The patient is hemodynamically stable however she had a recent exacerbation of abdominal bloating and distention of an unclear etiology.  She has  had a recent endoscopy which was grossly unremarkable.  With that being said, I do think that the patient will benefit from symptomatic therapy.  We will prescribe the patient prescription strength simethicone and effort of trying to optimize her bloating.  She also will benefit from having a small intestine bacterial overgrowth test to evaluate for the possibility of having this disorder.  She did have Barrett's esophagus on her recent endoscopy but I would still likely repeat an endoscopy in 3 years time to ensure that no other changes are present and plan to take biopsies no matter what feels to confirm that if she has not had Barrett's on 2 separate endoscopies 3 years that we likely can stop her surveillance at that point.  I would like the patient to stop Reglan as is not clear to me that effectiveness will be there at present.  We will hold on colonoscopy for now however as my discussion with the patient once again pursued we can always think about a diagnostic colonoscopy in the setting of her prior CT imaging although her symptoms are not hematocrit concerning for an underlying colonic mass/lesion when she just had a full colonoscopy in 2016 per report in [place].  All patient questions were answered, to the best of my ability, and the patient agrees to the aforementioned plan of action with follow-up as indicated.   PLAN  Send pancreatic elastase to evaluate for EPI Set up for SIBO breath testing with lactulose Initiate simethicone as per prescription below Reevaluate patient in 4 to 6 weeks to evaluate her symptoms Stop Reglan Holding on repeat colonoscopy for abnormal CT imaging because she had recent colonoscopy in 2016 2020 3 repeat EGD to ensure no evidence of Barrett's esophagus Continue FiberCon Continue MiraLAX as needed Holding on Amitiza/Linzess for now [place] for him to try and get patient assistance or other means of obtaining Dexilant Dexilant samples given to  patient today   Orders Placed This Encounter  Procedures  . Pancreatic Elastase, Fecal    New Prescriptions   No medications on file   Modified Medications   No medications on file    Planned Follow Up: No follow-ups on file.   [name], MD [facility] Office # [phone]

## 2018-07-29 NOTE — Patient Instructions (Signed)
You have been given a testing kit to check for small intestine bacterial overgrowth (SIBO) which is completed by a company named [facility]. Make sure to return your test in the mail using the return mailing label given you along with the kit. Your demographic and insurance information have already been sent to the company and they should be in contact with you over the next week regarding this test. Please keep in mind that you will be getting a call from phone number [phone] or a similar number. If you do not hear from them within this time frame, please call our office at [phone].    Stop Reglan  Start Simethicone 1 tablet every 8 hours as needed, up to 4 tablets daily, over the counter.   Your provider has requested that you go to the basement level for lab work before leaving today. Press "B" on the elevator. The lab is located at the first door on the left as you exit the elevator.   Thank you for choosing me and [facility].  Dr. Rush Landmark

## 2018-07-30 ENCOUNTER — Encounter: Payer: Self-pay | Admitting: Gastroenterology

## 2018-07-31 NOTE — Procedures (Signed)
NAME: Terri Barajas. [place]                                                                       DOB: [birth year] MEDICAL RECORD No: [id number]                                                             DOS:  07/23/2018 REFERRING PHYSICIAN: Briscoe Deutscher, DO STUDY PERFORMED: Home Sleep Test on Watch Pat HISTORY: [name] is a 73 y.o.AA female patient who recently moved from [place] and is seen on 06-30-2018 in a referral from Dr. Juleen China for a sleep evaluation. The patient considers herself a mouth breather, her husband stated she snores. She has woken up with a very dry mouth and sore throat in November, while the bedroom was heated. The dry mouth disappeared in December but snoring remains. She sleeps easily and wakes up once for nocturia. She may fall asleep on the sofa watching TV.  Sleeps on average 6-8 hours, wakes up spontaneously in AM when her husband leaves for work. Feeling refreshed, no headaches, no nausea.   Chief complaint according to patient: " I am very cold natured and have a dry mouth when I wake up".  Epworth Sleepiness score: 6/ 24 points; Fatigue severity score 16/ 63 points, BMI:26.6  STUDY RESULTS:  Total Recording Time: 7 h 44 mins; Calculated Sleep Time:   6 h 28 mins. Total Apnea/Hypopnea Index (AHI):  27.4 /h; RDI:  31.1/h; REM AHI: 40.4 /h. Average Oxygen Saturation:  94 %; Lowest Oxygen Saturation:  89 %. Total Time Oxygen Saturation below 89 %: 0.0 minutes  Average Heart Rate: 66 bpm, highest 100 bpm. All sleep supine.  The device did not give sufficient data of heart rhythm and lowest heart rate.  IMPRESSION:  moderate- severe sleep apnea, with mild- moderate snoring and without hypoxemia.  RECOMMENDATION: This apnea can be treated by dental device, CPAP machine and may respond to avoiding the supine sleep position. I will leave the treatment choice to the patient and will be available to discuss the options in a RV.   I certify that I have reviewed the  raw data recording prior to the issuance of this report in accordance with the standards of the American Academy of Sleep Medicine (AASM). Larey Seat, M.D.   07-31-2018   Medical Director of [facility], accredited by the AASM. Diplomat of the ABPN and ABSM.

## 2018-08-04 ENCOUNTER — Other Ambulatory Visit: Payer: Medicare Other

## 2018-08-04 ENCOUNTER — Telehealth (HOSPITAL_COMMUNITY): Payer: Self-pay | Admitting: Emergency Medicine

## 2018-08-04 ENCOUNTER — Other Ambulatory Visit: Payer: Self-pay | Admitting: Neurology

## 2018-08-04 ENCOUNTER — Telehealth: Payer: Self-pay | Admitting: Neurology

## 2018-08-04 DIAGNOSIS — K449 Diaphragmatic hernia without obstruction or gangrene: Secondary | ICD-10-CM | POA: Diagnosis not present

## 2018-08-04 DIAGNOSIS — R0683 Snoring: Secondary | ICD-10-CM

## 2018-08-04 DIAGNOSIS — R140 Abdominal distension (gaseous): Secondary | ICD-10-CM

## 2018-08-04 DIAGNOSIS — G4733 Obstructive sleep apnea (adult) (pediatric): Secondary | ICD-10-CM

## 2018-08-04 NOTE — Telephone Encounter (Signed)
Called the patient and reviewed the sleep study. Informed the pt of the moderate to severe apnea that was noted. Advised the patient that Dr Brett Fairy would recommend the patient use dental device, cpap or avoid sleeping on her back during the sleep. The patient states that she usually sleeps on back but in a recline position due to acid reflux concerns that she has. Pt has a mouthguard already from a dentist that she has for teeth grinding. I advised the pt that device may be something completely different then the dental device needed for sleep apnea treatment. Patient would like to check with her dentist first on this. I also advised the patient to ask if her dentist makes the dental devices for treatment of apnea because this is usually made by a specialist. Patient states she will reach out to her dentist and call back with what they recommend and advise [place] where we should send the referral. Pt verbalized understanding. Pt had no questions at this time but was encouraged to call back if questions arise. Advised the patient I would wait to hear back from her.

## 2018-08-04 NOTE — Telephone Encounter (Signed)
-----   Message from Larey Seat, MD sent at 07/31/2018 12:23 PM EST ----- IMPRESSION: moderate- severe sleep apnea, with mild- moderate  snoring and without hypoxemia.  RECOMMENDATION: This apnea can be treated by dental device, CPAP  machine and may respond to avoiding the supine sleep position. I  will leave the treatment choice to the patient and will be  available to discuss the options in a RV.  I would order Auto CPAP if her referring physician or cardiologist has any additional concern about delayed treatment.

## 2018-08-04 NOTE — Telephone Encounter (Signed)
Pt returning phone call -- pt verbalizes understanding of appt date/time, parking situation and where to check in, pre-test NPO status and medications ordered, and verified current allergies; name and call back number provided for further questions should they arise Terri Bond RN Navigator Cardiac Imaging [facility] [phone] office [phone] cell  Reviewed contrast allergy prophylaxis medication schedule with patient. States she has a ride home.

## 2018-08-04 NOTE — Telephone Encounter (Signed)
Left message on voicemail with name and callback number [name] RN Navigator Cardiac Imaging [facility] [phone] Office [phone] Cell  

## 2018-08-05 ENCOUNTER — Other Ambulatory Visit: Payer: Self-pay | Admitting: Pharmacy Technician

## 2018-08-05 ENCOUNTER — Telehealth: Payer: Self-pay | Admitting: Gastroenterology

## 2018-08-05 ENCOUNTER — Telehealth: Payer: Self-pay | Admitting: *Deleted

## 2018-08-05 LAB — HELICOBACTER PYLORI  SPECIAL ANTIGEN
MICRO NUMBER:: 209513
SPECIMEN QUALITY: ADEQUATE

## 2018-08-05 MED ORDER — METOPROLOL TARTRATE 25 MG PO TABS: 25.0000 mg | ORAL_TABLET | Freq: Two times a day (BID) | ORAL | 1 refills | Status: DC

## 2018-08-05 NOTE — Patient Outreach (Signed)
[facility]) Care Management  08/05/2018  RAYANN JOLLEY 07-31-1945 [id number]    ADDENDUM  Care coordination call placed to Dr. Donneta Romberg office in regards to [facility] application for [employer].  Spoke to Gilliam who is going to send back a message to his nurse to inquire if they have received the form that was faxed over on 07/27/2018 and 07/30/2018.  Will followup in 5-7 business days if call is not returned or application is not received.  Evann Koelzer P. Margaruite Top, [place] Management [phone]

## 2018-08-05 NOTE — Telephone Encounter (Signed)
Advised patient, verbalize understanding.    Skeet Latch, MD  [name], [place]; Alvina Filbert B, LPN        I see no reason for her to be on nadalol. She was on this prior to seeing me and I didn't change it but wouldn't argue that she needs it. She can try metoprolol tartrate 25mg  bid instead.   Previous Messages    ----- Message -----  From: [name], [facility]  Sent: 07/27/2018 11:24 AM EST  To: Skeet Latch, MD   Dr. Oval Linsey,   Please see the attached [facility] Pharmacist's note. Mrs. Cardell was referred for [place] due to medication affordability issues with Nadolol. Nadolol is a tier 4 medication on the patient's insurance and carries a copay of around $100 for a 30 day supply. To help with the cost, a tier exception could be initiated with the patient's insurance by calling: [phone]. Unfortunately, [facility] does not allow tier exceptions to be done via fax or www.covermymeds.   Thank you so much for your time and consideration.   [name], PharmD, [place] Clinical Pharmacist  [phone]

## 2018-08-05 NOTE — Telephone Encounter (Signed)
Sharee Pimple call to see if they recv patient assistant application which was faxed over on the 10th and 13th

## 2018-08-05 NOTE — Telephone Encounter (Signed)
Pt returned call and informed me that she would like to attempt the CPAP route.I reviewed PAP compliance expectations with the pt. Pt is agreeable to starting a CPAP. I advised pt that an order will be sent to a DME, [facility], and [facility] will call the pt within about one week after they file with the pt's insurance. [facility] will show the pt how to use the machine, fit for masks, and troubleshoot the CPAP if needed. A follow up appt was made for insurance purposes with Debbora Presto on April 21,2020 at 9 am. Pt verbalized understanding to arrive 15 minutes early and bring their CPAP. A letter with all of this information in it will be mailed to the pt as a reminder. I verified with the pt that the address we have on file is correct. Pt verbalized understanding of results. Pt had no questions at this time but was encouraged to call back if questions arise. I have sent the order to [facility] and have received confirmation that they have received the order.

## 2018-08-05 NOTE — Patient Outreach (Signed)
[facility]) Care Management  08/05/2018  Terri Barajas 1945-09-23 [id number]   Successful outreach call placed to patient in regards to [facility] application for [name].  Spoke to patient, HIPAA identifiers verified. Inquired if patient had received her application in the mail and the patient informed that she was not sure. She informed me that she was in the middle of moving and had a lot on her at this time and was really unsure. She inquired if I could email ([email or web address]) her my contact information as she did not have a pen or piece of paper to write my number down.   Will followup with patient in 3-5 business days to inquire if she did indeed received the application and if she has mailed it back.  Atha Mcbain P. [name], [place] Management [phone]

## 2018-08-05 NOTE — Telephone Encounter (Signed)
-----   Message from Skeet Latch, MD sent at 08/01/2018  4:53 PM EST ----- I see no reason for her to be on nadalol.  She was on this prior to seeing me and I didn't change it but wouldn't argue that she needs it.  She can try metoprolol tartrate 25mg  bid instead. ----- Message ----- From: [name], [facility] Sent: 07/27/2018  11:24 AM EST To: Skeet Latch, MD  Dr. Oval Linsey,  Please see the attached [facility] Pharmacist's note. Mrs. Chronister was referred for [place] due to  medication affordability issues with Nadolol.  Nadolol is a tier 4 medication on the patient's insurance and carries a copay of around $100 for a 30 day supply.  To help with the cost, a tier exception could be initiated with the patient's insurance by calling:  [phone].  Unfortunately, [facility] does not allow tier exceptions to be done via fax or www.covermymeds.  Thank you so much for your time and consideration.  [name], PharmD, [place] Clinical Pharmacist [phone]

## 2018-08-05 NOTE — Telephone Encounter (Signed)
[place] have you seen the application for assistance?

## 2018-08-06 ENCOUNTER — Encounter (HOSPITAL_COMMUNITY): Payer: Self-pay

## 2018-08-06 ENCOUNTER — Ambulatory Visit (HOSPITAL_COMMUNITY)
Admission: RE | Admit: 2018-08-06 | Discharge: 2018-08-06 | Disposition: A | Discharge status: Home / Self Care | Payer: Medicare Other | Source: Ambulatory Visit | Attending: Cardiovascular Disease | Admitting: Cardiovascular Disease

## 2018-08-06 DIAGNOSIS — R072 Precordial pain: Secondary | ICD-10-CM | POA: Insufficient documentation

## 2018-08-06 MED ORDER — NITROGLYCERIN 0.4 MG SL SUBL
0.8000 mg | SUBLINGUAL_TABLET | Freq: Once | SUBLINGUAL | Status: DC
  Filled 2018-08-06: qty 25

## 2018-08-06 MED ORDER — NITROGLYCERIN 0.4 MG SL SUBL
SUBLINGUAL_TABLET | SUBLINGUAL | Status: AC
  Filled 2018-08-06: qty 2

## 2018-08-06 MED ORDER — IOPAMIDOL (ISOVUE-370) INJECTION 76%: 80.0000 mL | Freq: Once | INTRAVENOUS | Status: DC | PRN

## 2018-08-06 MED ORDER — METOPROLOL TARTRATE 5 MG/5ML IV SOLN
INTRAVENOUS | Status: AC
  Filled 2018-08-06: qty 5

## 2018-08-06 MED ORDER — DILTIAZEM HCL 25 MG/5ML IV SOLN
INTRAVENOUS | Status: AC
  Administered 2018-08-06: 5 mg
  Filled 2018-08-06: qty 5

## 2018-08-06 MED ORDER — METOPROLOL TARTRATE 5 MG/5ML IV SOLN
5.0000 mg | INTRAVENOUS | Status: DC | PRN
  Administered 2018-08-06 (×4): 5 mg via INTRAVENOUS
  Filled 2018-08-06 (×3): qty 5

## 2018-08-06 MED ORDER — METOPROLOL TARTRATE 5 MG/5ML IV SOLN
INTRAVENOUS | Status: AC
  Administered 2018-08-06: 5 mg via INTRAVENOUS
  Filled 2018-08-06: qty 20

## 2018-08-06 MED ORDER — DILTIAZEM HCL 25 MG/5ML IV SOLN
5.0000 mg | Freq: Once | INTRAVENOUS | Status: DC
  Filled 2018-08-06: qty 5

## 2018-08-06 NOTE — Progress Notes (Deleted)
Dr. Aundra Dubin notified that patients HR not responding to medication (25mg  lopressor IV and 5mg  Cardizem IV) and HR ranges from mid 70's to mid 80's.  Dr Aundra Dubin stated to abort the cardiac CTA.  Notified patient that exam has been cancelled due to elevated HR.

## 2018-08-06 NOTE — Progress Notes (Signed)
Dr. Aundra Dubin notified that patients HR not responding to medication (25mg  lopressor IV and 5mg  Cardizem IV) and HR ranges from mid 70's to mid 80's.  Dr Aundra Dubin stated to abort the cardiac CTA.  Notified patient that exam has been cancelled due to elevated HR.

## 2018-08-06 NOTE — Progress Notes (Addendum)
Dr. Aundra Dubin notified that patients HR not responding to medication (25mg  lopressor IV and 5mg  Cardizem IV) and HR ranges from mid 70's to mid 80's.  Dr Aundra Dubin stated to abort the cardiac CTA.  Notified patient that exam has been cancelled due to elevated HR.

## 2018-08-06 NOTE — Telephone Encounter (Signed)
Assistance application faxed back to [employer] @ [phone].

## 2018-08-11 ENCOUNTER — Other Ambulatory Visit: Payer: Self-pay | Admitting: Pharmacy Technician

## 2018-08-11 NOTE — Patient Outreach (Signed)
[place] [facility]) Care Management  08/11/2018  Terri Barajas 1946/02/12 [id number]    Unsuccessful call attempt placed to patient to followup for the 2nd time to see if she had received the [facility] patient assistance application in the mail for [place].  Unfortunately patient did not answer the phone. HIPAA compliant voicemail left.  Will followup with 3rd phone call in 3-5 business days to see if patient has received the application.  [name] P. Kedra Mcglade, [place] Management [phone]

## 2018-08-13 ENCOUNTER — Ambulatory Visit: Payer: Self-pay | Admitting: Pharmacist

## 2018-08-17 ENCOUNTER — Other Ambulatory Visit: Payer: Self-pay | Admitting: Pharmacy Technician

## 2018-08-17 ENCOUNTER — Telehealth: Payer: Self-pay | Admitting: Cardiovascular Disease

## 2018-08-17 ENCOUNTER — Telehealth: Payer: Self-pay | Admitting: Gastroenterology

## 2018-08-17 DIAGNOSIS — R072 Precordial pain: Secondary | ICD-10-CM

## 2018-08-17 DIAGNOSIS — R109 Unspecified abdominal pain: Secondary | ICD-10-CM

## 2018-08-17 DIAGNOSIS — R748 Abnormal levels of other serum enzymes: Secondary | ICD-10-CM

## 2018-08-17 DIAGNOSIS — K5900 Constipation, unspecified: Secondary | ICD-10-CM

## 2018-08-17 NOTE — Telephone Encounter (Signed)
New Message:     Pt said she was unable to have her CT on 08-06-18. She said Dr Oval Linsey said she was going to order something else. She wants to know if Dr Oval Linsey have decided on what type of test she will have?

## 2018-08-17 NOTE — Patient Outreach (Signed)
[place] [facility]) Care Management  08/17/2018  SHAKILA MAK [birth year] [id number]   Successful outreach call placed to patient in regards to [facility] application for [place].  This was my 3rd call attempt in regards to the receipt of her application.  Spoke to patient but she was unable to talk to me at this time as she was driving. She requested a call back at a later time.  Will make 4th outreach attempt in 3-5 business days to engage patient in answering the question as to whether she has receive the application or not before closing the case due to lack of engagement.  Creedence Heiss P. Brylen Wagar, [place] Management [phone]

## 2018-08-17 NOTE — Telephone Encounter (Signed)
Pt advised that she has not got the other results for her stool test.

## 2018-08-17 NOTE — Telephone Encounter (Signed)
Rovanda you were working with the pt regarding her stool test.  Do you know anything about the pancreatic elastase?

## 2018-08-18 NOTE — Telephone Encounter (Signed)
I spoke with the pt and she will come by and pick up another kit and complete as soon as she can.

## 2018-08-18 NOTE — Telephone Encounter (Signed)
The order was not futured and was not completed.  [facility] was called to try and add the test to the stool already collected.   They can NOT add the test to the stool already discarded.  I  called [phone] account number [id number] order in Epic Left message on machine to call back

## 2018-08-20 ENCOUNTER — Other Ambulatory Visit: Payer: Self-pay | Admitting: Pharmacy Technician

## 2018-08-20 DIAGNOSIS — D229 Melanocytic nevi, unspecified: Secondary | ICD-10-CM | POA: Diagnosis not present

## 2018-08-20 DIAGNOSIS — L853 Xerosis cutis: Secondary | ICD-10-CM | POA: Diagnosis not present

## 2018-08-20 NOTE — Patient Outreach (Signed)
[place] [facility]) Care Management  08/20/2018  [place] March 20, 1946 [id number]   ADDENDUM  Incoming call received from patient. HIPAA identifiers verified.   Patient was returning my call. Inquired if patient had received her patient assistance applications in the mail and she informed that she thinks she has but is still in the process of moving.  Informed patient to give me a call when she was ready and able to discuss the patient assistance application and/or whenever she had placed it in the mail. Patient verbalized understanding.  Will await patient's call concerning patient assistance as 4 outreach attempts to engage patient have already been made.  Gianni Fuchs P. Angeleah Labrake, [place] Management [phone]

## 2018-08-20 NOTE — Patient Outreach (Signed)
[place] [facility]) Care Management  08/20/2018  ANI DEOLIVEIRA [birth year] [id number]   Unsuccessful outreach call placed to patient in regards to patient assistance application with Bernita Buffy for [place].  Unfortunately patient did not answer the phone. This was my 4th attempt in trying to engage the patient to inquire if she has received the patient assistance application that was mailed on 07/28/2018. A HIPAA compliant voicemail was left.  Will route note to [place] for case closure due to lack of engagement.  Clifford Benninger P. Maisie Hauser, [place] Management [phone]

## 2018-08-21 DIAGNOSIS — G4733 Obstructive sleep apnea (adult) (pediatric): Secondary | ICD-10-CM | POA: Diagnosis not present

## 2018-08-24 NOTE — Telephone Encounter (Signed)
Recommend getting a Lexiscan Myoview instead.

## 2018-08-25 NOTE — Telephone Encounter (Signed)
Left message to call back  

## 2018-08-26 ENCOUNTER — Telehealth: Payer: Self-pay | Admitting: Pharmacist

## 2018-08-26 ENCOUNTER — Ambulatory Visit: Payer: Self-pay | Admitting: Pharmacist

## 2018-08-26 NOTE — Telephone Encounter (Signed)
-----   Message from Jason Fila, CPhT sent at 08/20/2018  9:38 AM EST ----- fyi case closure due to lack of engagement

## 2018-08-26 NOTE — Patient Outreach (Signed)
[place] [facility]) Care Management  08/26/2018  SHAVONDA WIEDMAN [birth year] [id number]   Patient's pharmacy case is being closed. She reported receiving the patient assistance forms we mailed to her home and said she will mail the forms back at her leisure.   Plan: If we receive her forms, patient's case will be reopened.  [name], PharmD, [place] Clinical Pharmacist [phone]   .

## 2018-08-27 NOTE — Telephone Encounter (Signed)
Advised patient, verbalized understanding  

## 2018-08-28 ENCOUNTER — Telehealth: Payer: Self-pay | Admitting: Gastroenterology

## 2018-08-28 ENCOUNTER — Telehealth (HOSPITAL_COMMUNITY): Payer: Self-pay

## 2018-08-28 ENCOUNTER — Other Ambulatory Visit: Payer: Self-pay

## 2018-08-28 MED ORDER — DEXLANSOPRAZOLE 60 MG PO CPDR: 60.0000 mg | DELAYED_RELEASE_CAPSULE | Freq: Every day | ORAL | 2 refills | Status: DC

## 2018-08-28 NOTE — Telephone Encounter (Signed)
Encounter complete. 

## 2018-08-28 NOTE — Telephone Encounter (Signed)
Pt needs rf for Dexilant sent to [facility] on [address].

## 2018-08-28 NOTE — Telephone Encounter (Signed)
Refill sent.

## 2018-09-01 ENCOUNTER — Ambulatory Visit: Payer: Medicare Other | Admitting: Gastroenterology

## 2018-09-02 ENCOUNTER — Other Ambulatory Visit: Payer: Self-pay

## 2018-09-02 ENCOUNTER — Other Ambulatory Visit: Payer: Medicare Other

## 2018-09-02 ENCOUNTER — Telehealth: Payer: Self-pay

## 2018-09-02 ENCOUNTER — Ambulatory Visit (HOSPITAL_COMMUNITY)
Admission: RE | Admit: 2018-09-02 | Discharge: 2018-09-02 | Disposition: A | Discharge status: Home / Self Care | Payer: Medicare Other | Source: Ambulatory Visit | Attending: Internal Medicine | Admitting: Internal Medicine

## 2018-09-02 DIAGNOSIS — R748 Abnormal levels of other serum enzymes: Secondary | ICD-10-CM | POA: Diagnosis not present

## 2018-09-02 DIAGNOSIS — R109 Unspecified abdominal pain: Secondary | ICD-10-CM | POA: Diagnosis not present

## 2018-09-02 DIAGNOSIS — R072 Precordial pain: Secondary | ICD-10-CM | POA: Diagnosis not present

## 2018-09-02 DIAGNOSIS — K5900 Constipation, unspecified: Secondary | ICD-10-CM | POA: Diagnosis not present

## 2018-09-02 MED ORDER — REGADENOSON 0.4 MG/5ML IV SOLN
0.4000 mg | Freq: Once | INTRAVENOUS | Status: AC
  Administered 2018-09-02: 0.4 mg via INTRAVENOUS

## 2018-09-02 MED ORDER — TECHNETIUM TC 99M TETROFOSMIN IV KIT
30.8000 | PACK | Freq: Once | INTRAVENOUS | Status: AC | PRN
  Administered 2018-09-02: 30.8 via INTRAVENOUS
  Filled 2018-09-02: qty 31

## 2018-09-02 MED ORDER — TECHNETIUM TC 99M TETROFOSMIN IV KIT
10.9000 | PACK | Freq: Once | INTRAVENOUS | Status: AC | PRN
  Administered 2018-09-02: 10.9 via INTRAVENOUS
  Filled 2018-09-02: qty 11

## 2018-09-02 NOTE — Telephone Encounter (Signed)
I spoke with [place] from [facility]- they have tried reaching out to pt x3 with no response, they have also not recd a completed kit back from patient, as per my conversation with Dr. Rush Landmark we will try and reach out to patient at a later time as she cancelled her follow- up appointment due to corona virus outbreak.

## 2018-09-02 NOTE — Telephone Encounter (Signed)
-----   Message from Irving Copas., MD sent at 08/31/2018  4:08 PM EDT ----- Regarding: Upcoming clinic appointment Patty or Tyreka Henneke,I was wondering if 1 of you could reach out to [facility] to see if the patient ever returned her breath test and if they have the results of that as she has a clinic appointment tomorrow.If she has not returned a breath test then I am fine with waiting and seeing her if necessary tomorrow.If she has had it done we need to try and get the results before tomorrow.Thank you.GM

## 2018-09-03 LAB — MYOCARDIAL PERFUSION IMAGING
CHL CUP RESTING HR STRESS: 57 {beats}/min
LV dias vol: 71 mL (ref 46–106)
LV sys vol: 25 mL
Peak HR: 108 {beats}/min
SDS: 0
SRS: 1
SSS: 1
TID: 1.35

## 2018-09-09 LAB — PANCREATIC ELASTASE, FECAL: Pancreatic Elastase-1, Stool: >500 mcg/g

## 2018-09-21 DIAGNOSIS — G4733 Obstructive sleep apnea (adult) (pediatric): Secondary | ICD-10-CM | POA: Diagnosis not present

## 2018-09-30 ENCOUNTER — Encounter: Payer: Self-pay | Admitting: General Surgery

## 2018-10-01 ENCOUNTER — Ambulatory Visit (INDEPENDENT_AMBULATORY_CARE_PROVIDER_SITE_OTHER): Payer: Medicare Other | Admitting: Gastroenterology

## 2018-10-01 ENCOUNTER — Telehealth: Payer: Self-pay | Admitting: Family Medicine

## 2018-10-01 ENCOUNTER — Ambulatory Visit: Payer: Medicare Other | Admitting: Gastroenterology

## 2018-10-01 ENCOUNTER — Other Ambulatory Visit: Payer: Self-pay

## 2018-10-01 DIAGNOSIS — K219 Gastro-esophageal reflux disease without esophagitis: Secondary | ICD-10-CM | POA: Diagnosis not present

## 2018-10-01 DIAGNOSIS — R935 Abnormal findings on diagnostic imaging of other abdominal regions, including retroperitoneum: Secondary | ICD-10-CM

## 2018-10-01 DIAGNOSIS — R140 Abdominal distension (gaseous): Secondary | ICD-10-CM

## 2018-10-01 MED ORDER — DEXLANSOPRAZOLE 60 MG PO CPDR: 60.0000 mg | DELAYED_RELEASE_CAPSULE | Freq: Every day | ORAL | 11 refills | Status: DC

## 2018-10-01 NOTE — Patient Instructions (Signed)
If you are age 71 or older, your body mass index should be between 23-30. Your There is no height or weight on file to calculate BMI. If this is out of the aforementioned range listed, please consider follow up with your Primary Care Provider.  If you are age 34 or younger, your body mass index should be between 19-25. Your There is no height or weight on file to calculate BMI. If this is out of the aformentioned range listed, please consider follow up with your Primary Care Provider.   We have sent the following medications to your pharmacy for you to pick up at your convenience: Dexilant   Thank you for choosing me and [facility].  Dr. Rush Landmark

## 2018-10-01 NOTE — Progress Notes (Signed)
[place] VISIT   Primary Care Provider Briscoe Deutscher, [place] [address] [phone]  Patient Profile: Terri Barajas is a 73 y.o. female  with a pmh significant for HTN, Glaucoma, MVP, GERD, Barrett's esophagus (negative on recent 2020 EGD).  The patient presents to the [facility] for an evaluation and management of problem(s) noted below:  Problem List 1. Bloating   2. Gastroesophageal reflux disease, esophagitis presence not specified   3. Abnormal CT of the abdomen     History of Present Illness Please see initial consultation note and follow-up notes for full details of HPI.    Due to the COVID-19 Pandemic, this service was provided via telemedicine using [employer]. The patient was located at home. The provider was located in the office. The patient did consent to this visit and is aware of charges through their insurance. Other persons participating in this telemedicine service were none.  Interval History This is a scheduled follow-up since the patient initiated the over-the-counter simethicone she has had significant improvement in her bloating overall.  She did present and give stool studies that showed no evidence of exocrine pancreatic insufficiency.  She is currently taking the over-the-counter simethicone twice daily and she will take it on an as-needed basis if she feels there are certain foods that may cause her more issues.  Her bowel movements remain normal.  No significant abdominal pain.  She has the test kit for SIBO breath testing but as she has had improvement while using the simethicone she has not had to send that off.  Otherwise no significant GI complaints.  The patient recently underwent cardiology evaluation and per her report has been negative.  They were trying to transition her to metoprolol however she had to go back on nadolol.  GI Review of Systems Positive as  above including medically controlled GERD on Dexilant Negative for dysphagia, odynophagia, melena, hematochezia, change in bowel habits  Review of Systems General: Denies fevers/chills Cardiovascular: Denies current chest pain/palpitations Pulmonary: Denies shortness of breath Gastroenterological: See HPI Genitourinary: Denies darkened urine Hematological: Denies easy bruising Skin: Denies jaundice Psychological: Mood is mildly anxious as result of the COVID-19 pandemic although she tries to stay positive she has former church members who have passed away in [place] from the COVID-19  Medications Current Outpatient Medications  Medication Sig Dispense Refill  . ALPRAZolam (XANAX) 0.5 MG tablet Take 0.25 mg by mouth daily as needed for anxiety.     Marland Kitchen aspirin 81 MG chewable tablet Chew 81 mg by mouth daily.    Marland Kitchen BIOTIN PO Take 1 tablet by mouth daily.     . Cholecalciferol (VITAMIN D3) 25 MCG (1000 UT) CAPS Take 1,000 Units by mouth daily.    Marland Kitchen dexlansoprazole (DEXILANT) 60 MG capsule Take 1 capsule (60 mg total) by mouth daily. 30 capsule 11  . diphenhydrAMINE (BENADRYL) 50 MG capsule Take 50 mg by mouth as needed. Take 1 one hour prior to CT    . hydrochlorothiazide (HYDRODIURIL) 25 MG tablet Take 25 mg by mouth every other day.     . latanoprost (XALATAN) 0.005 % ophthalmic solution Place 1 drop into both eyes at bedtime.     . Multiple Vitamins-Minerals (CENTRUM SILVER 50+WOMEN PO) Take 1 tablet by mouth daily.    . nadolol (CORGARD) 20 MG tablet Take 1 tablet (20 mg total) by mouth daily. 90 tablet 3  . Omega-3 Fatty Acids (FISH OIL PO) Take 1  capsule by mouth daily.    . polycarbophil (FIBERCON) 625 MG tablet Take 625 mg by mouth daily.    . polyethylene glycol (MIRALAX / GLYCOLAX) packet Take 17 g by mouth daily.    . simethicone (MYLICON) 852 MG chewable tablet Chew 125 mg by mouth 3 (three) times daily as needed for flatulence.    . metoprolol tartrate (LOPRESSOR) 25 MG  tablet Take 1 tablet (25 mg total) by mouth 2 (two) times daily. (Patient not taking: Reported on 10/04/2018) 180 tablet 1  . predniSONE (DELTASONE) 50 MG tablet TAKE 1 TABLET 13 hours prior to test, another 7 hours prior to test, AND another 1 hour prior to test. (Patient not taking: Reported on 10/04/2018) 3 tablet 0   No current facility-administered medications for this visit.    Allergies Allergies  Allergen Reactions  . Atracurium & Derivatives Other (See Comments)    Unknown  . Atropine Other (See Comments)    Heart racing  . Iodine Other (See Comments)    Unknown  . Shellfish Allergy Other (See Comments)    Unknown  . Statins     "didn't feel right"   Histories Past Medical History:  Diagnosis Date  . Anxiety   . Arthritis   . Complication of anesthesia    some type of sedation medication made her feel crazy  . Dysrhythmia   . Family history of adverse reaction to anesthesia    daughter has Malignant Hyperthermia hx -   . GERD (gastroesophageal reflux disease)   . Glaucoma   . H/O: hysterectomy 1978  . Heart disease   . History of hiatal hernia   . Hypertension   . MVP (mitral valve prolapse)   . Panic attacks   . Pre-diabetes    Past Surgical History:  Procedure Laterality Date  . APPENDECTOMY  1972  . BIOPSY  07/06/2018   Procedure: BIOPSY;  Surgeon: Rush Landmark Telford Nab., MD;  Location: [facility];  Service: Gastroenterology;;  . CATARACT EXTRACTION  2013  . [place]  . COLONOSCOPY    . ESOPHAGOGASTRODUODENOSCOPY (EGD) WITH PROPOFOL N/A 07/06/2018   Procedure: ESOPHAGOGASTRODUODENOSCOPY (EGD) WITH PROPOFOL;  Surgeon: Rush Landmark Telford Nab., MD;  Location: [facility];  Service: Gastroenterology;  Laterality: N/A;  . HAND SURGERY Bilateral   . HEMORRHOID SURGERY  2018  . S/P Hysterectomy   1987   Social History   Socioeconomic History  . Marital status: Married    Spouse name: Not on file  . Number of children: Not on file   . Years of education: 23  . Highest education level: Not on file  Occupational History  . Not on file  Social Needs  . Financial resource strain: Not on file  . Food insecurity:    Worry: Not on file    Inability: Not on file  . Transportation needs:    Medical: Not on file    Non-medical: Not on file  Tobacco Use  . Smoking status: Never Smoker  . Smokeless tobacco: Never Used  Substance and Sexual Activity  . Alcohol use: Not Currently  . Drug use: Not Currently  . Sexual activity: Yes    Partners: Male  Lifestyle  . Physical activity:    Days per week: Not on file    Minutes per session: Not on file  . Stress: Not on file  Relationships  . Social connections:    Talks on phone: Not on file    Gets together: Not  on file    Attends religious service: Not on file    Active member of club or organization: Not on file    Attends meetings of clubs or organizations: Not on file    Relationship status: Not on file  . Intimate partner violence:    Fear of current or ex partner: Not on file    Emotionally abused: Not on file    Physically abused: Not on file    Forced sexual activity: Not on file  Other Topics Concern  . Not on file  Social History Narrative  . Not on file   Family History  Problem Relation Age of Onset  . Pancreatic cancer Mother   . Alcohol abuse Father   . Asthma Sister   . Hypertension Sister   . Arthritis Sister   . Diabetes Brother   . Arthritis Brother   . Hyperlipidemia Brother   . Hypertension Brother   . Glaucoma Brother   . Colon polyps Brother   . Arthritis Sister   . Arthritis Sister   . Prostate cancer Brother   . Congenital heart disease Brother   . Colon cancer Neg Hx   . Esophageal cancer Neg Hx   . Inflammatory bowel disease Neg Hx   . Liver disease Neg Hx   . Rectal cancer Neg Hx   . Stomach cancer Neg Hx    I have reviewed her medical, social, and family history in detail and updated the electronic medical record as  necessary.    PHYSICAL EXAMINATION  Telehealth visit   REVIEW OF DATA  I reviewed the following data at the time of this encounter:  GI Procedures and Studies  No new relevant studies to review  Laboratory Studies  Reviewed in EPIC  Imaging Studies  No new studies   ASSESSMENT  Ms. Rish is a 73 y.o. female with a pmh significant for HTN, Glaucoma, MVP, GERD, Barrett's esophagus (negative on recent 2020 EGD).  The patient is seen today for a return visit for evaluation and management of:  1. Bloating   2. Gastroesophageal reflux disease, esophagitis presence not specified   3. Abnormal CT of the abdomen    The patient seems to be hemodynamically and clinically stable.  She has had significant improvement in her bloating with the use of simethicone on a twice daily use as well as needed basis.  I have asked her to go ahead and continue that.  Reasonable to hold on SIBO breath testing is I think pretest probability is low but if symptoms worsen we may consider that in the future.  Her GERD symptoms seem to be stable at this point in time.  With a stable weight and prior colonoscopy before moving to [place] I do not think we need to pursue the CT scan imaging findings unless the patient has recurrent or worsening symptoms.  Will consider in 2021 for full colonoscopy.  All patient questions were answered, to the best of my ability, and the patient agrees to the aforementioned plan of action with follow-up as indicated.   PLAN  Continue simethicone Hold on SIBO breath testing Holding on repeat colonoscopy due to patient's clinical stability  2023 EGD to ensure no evidence of Barrett's esophagus Continue FiberCon Continue MiraLAX as needed Holding on Amitiza/Linzess for now Continue Dexilant   No orders of the defined types were placed in this encounter.   New Prescriptions   No medications on file   Modified Medications   Modified  Medication Previous Medication    DEXLANSOPRAZOLE (DEXILANT) 60 MG CAPSULE dexlansoprazole (DEXILANT) 60 MG capsule      Take 1 capsule (60 mg total) by mouth daily.    Take 1 capsule (60 mg total) by mouth daily.    Planned Follow Up: Return in about 4 weeks (around 10/29/2018).   [name], MD [facility] Office # [phone]

## 2018-10-01 NOTE — Telephone Encounter (Signed)
I called and spoke with the patient regarding changing her visit to a telephone visit. She consented, she asked if there would be a charge and I told her we would file her insurance and there may be a copay, she consented. DW

## 2018-10-04 ENCOUNTER — Encounter: Payer: Self-pay | Admitting: Gastroenterology

## 2018-10-04 DIAGNOSIS — R935 Abnormal findings on diagnostic imaging of other abdominal regions, including retroperitoneum: Secondary | ICD-10-CM | POA: Insufficient documentation

## 2018-10-05 ENCOUNTER — Encounter: Payer: Self-pay | Admitting: Family Medicine

## 2018-10-05 ENCOUNTER — Telehealth: Payer: Self-pay

## 2018-10-05 NOTE — Telephone Encounter (Signed)
Chart updated.  No changes since seeing her [place].

## 2018-10-05 NOTE — Telephone Encounter (Signed)
Virtual Visit Pre-Appointment Phone Call  Steps For Call: [place] PHONE   1. Confirm consent - "In the setting of the current Covid19 crisis, you are scheduled for a (phone or video) visit with your provider on (date) at (time).  Just as we do with many in-office visits, in order for you to participate in this visit, we must obtain consent.  If you'd like, I can send this to your mychart (if signed up) or email for you to review.  Otherwise, I can obtain your verbal consent now.  All virtual visits are billed to your insurance company just like a normal visit would be.  By agreeing to a virtual visit, we'd like you to understand that the technology does not allow for your provider to perform an examination, and thus may limit your provider's ability to fully assess your condition. If your provider identifies any concerns that need to be evaluated in person, we will make arrangements to do so.  Finally, though the technology is pretty good, we cannot assure that it will always work on either your or our end, and in the setting of a video visit, we may have to convert it to a phone-only visit.  In either situation, we cannot ensure that we have a secure connection.  Are you willing to proceed?" STAFF: Did the patient verbally acknowledge consent to telehealth visit? Document YES/NO here:   2. Confirm the BEST phone number to call the day of the visit by including in appointment notes  3. Give patient instructions for WebEx/MyChart download to smartphone as below or Doximity/Doxy.me if video visit (depending on what platform provider is using)  4. Advise patient to be prepared with their blood pressure, heart rate, weight, any heart rhythm information, their current medicines, and a piece of paper and pen handy for any instructions they may receive the day of their visit  5. Inform patient they will receive a phone call 15 minutes prior to their appointment time (may be from unknown caller  ID) so they should be prepared to answer  6. Confirm that appointment type is correct in Epic appointment notes (VIDEO vs PHONE)     TELEPHONE CALL NOTE  Terri Barajas has been deemed a candidate for a follow-up tele-health visit to limit community exposure during the Covid-19 pandemic. I spoke with the patient via phone to ensure availability of phone/video source, confirm preferred email & phone number, and discuss instructions and expectations.  I reminded Terri Barajas to be prepared with any vital sign and/or heart rhythm information that could potentially be obtained via home monitoring, at the time of her visit. I reminded Terri Barajas to expect a phone call at the time of her visit if her visit.  [place] 10/05/2018 12:20 PM   INSTRUCTIONS FOR DOWNLOADING THE [place] APP TO SMARTPHONE  - If Apple, ask patient to go to [employer] and type in WebEx in the search bar. [place] [employer], the blue/H[place] circle. If Android, go to [employer] and type in [employer] in the search bar. The app is free but as with any other app downloads, their phone may require them to verify saved payment information or Apple/Android password.  - The patient does NOT have to create an account. - On the day of the visit, the assist will walk the patient through joining the meeting with the meeting number/password.  INSTRUCTIONS FOR DOWNLOADING THE MYCHART APP TO SMARTPHONE  -  The patient must first make sure to have activated MyChart and know their login information - If Apple, go to [employer] and type in MyChart in the search bar and download the app. If Android, ask patient to go to [employer] and type in [place] in the search bar and download the app. The app is free but as with any other app downloads, their phone may require them to verify saved payment information or Apple/Android password.  - The patient will need to then log into the app with their MyChart  username and password, and select [facility] as their healthcare provider to link the account. When it is time for your visit, go to the MyChart app, find appointments, and click Begin Video Visit. Be sure to Select Allow for your device to access the Microphone and Camera for your visit. You will then be connected, and your provider will be with you shortly.  **If they have any issues connecting, or need assistance please contact MyChart service desk [phone])**  **If using a computer, in order to ensure the best quality for their visit they will need to use either of the following Internet Browsers: [employer], or Google Chrome  IF USING DOXIMITY or DOXY.ME - The patient will receive a link just prior to their visit, either by text or email (to be determined day of appointment depending on if it's doxy.me or Doximity).     FULL LENGTH CONSENT FOR TELE-HEALTH VISIT   I hereby voluntarily request, consent and authorize [facility] and its employed or contracted physicians, physician assistants, nurse practitioners or other licensed health care professionals (the Practitioner), to provide me with telemedicine health care services (the "Services") as deemed necessary by the treating Practitioner. I acknowledge and consent to receive the Services by the Practitioner via telemedicine. I understand that the telemedicine visit will involve communicating with the Practitioner through live audiovisual communication technology and the disclosure of certain medical information by electronic transmission. I acknowledge that I have been given the opportunity to request an in-person assessment or other available alternative prior to the telemedicine visit and am voluntarily participating in the telemedicine visit.  I understand that I have the right to withhold or withdraw my consent to the use of telemedicine in the course of my care at any time, without affecting my right to future care  or treatment, and that the Practitioner or I may terminate the telemedicine visit at any time. I understand that I have the right to inspect all information obtained and/or recorded in the course of the telemedicine visit and may receive copies of available information for a reasonable fee.  I understand that some of the potential risks of receiving the Services via telemedicine include:  [place] Kitchen Delay or interruption in medical evaluation due to technological equipment failure or disruption; . Information transmitted may not be sufficient (e.g. poor resolution of images) to allow for appropriate medical decision making by the Practitioner; and/or  . In rare instances, security protocols could fail, causing a breach of personal health information.  Furthermore, I acknowledge that it is my responsibility to provide information about my medical history, conditions and care that is complete and accurate to the best of my ability. I acknowledge that Practitioner's advice, recommendations, and/or decision may be based on factors not within their control, such as incomplete or inaccurate data provided by me or distortions of diagnostic images or specimens that may result from electronic transmissions. I understand that the practice of  medicine is not an Chief Strategy Officer and that Practitioner makes no warranties or guarantees regarding treatment outcomes. I acknowledge that I will receive a copy of this consent concurrently upon execution via email to the email address I last provided but may also request a printed copy by calling the office of [facility].    I understand that my insurance will be billed for this visit.   I have read or had this consent read to me. . I understand the contents of this consent, which adequately explains the benefits and risks of the Services being provided via telemedicine.  . I have been provided ample opportunity to ask questions regarding this consent and the Services and have had  my questions answered to my satisfaction. . I give my informed consent for the services to be provided through the use of telemedicine in my medical care  By participating in this telemedicine visit I agree to the above.

## 2018-10-06 ENCOUNTER — Other Ambulatory Visit: Payer: Self-pay

## 2018-10-06 ENCOUNTER — Ambulatory Visit (INDEPENDENT_AMBULATORY_CARE_PROVIDER_SITE_OTHER): Payer: Medicare Other | Admitting: Family Medicine

## 2018-10-06 ENCOUNTER — Encounter: Payer: Self-pay | Admitting: Family Medicine

## 2018-10-06 DIAGNOSIS — G4733 Obstructive sleep apnea (adult) (pediatric): Secondary | ICD-10-CM

## 2018-10-06 NOTE — Progress Notes (Signed)
PATIENT: Terri Barajas DOB: [birth year]  REASON FOR VISIT: follow up HISTORY FROM: patient  Virtual Visit via Telephone Note  I connected with Terri Barajas on 10/06/18 at  9:00 AM EDT by telephone and verified that I am speaking with the correct person using two identifiers.   I discussed the limitations, risks, security and privacy concerns of performing an evaluation and management service by telephone and the availability of in person appointments. I also discussed with the patient that there may be a patient responsible charge related to this service. The patient expressed understanding and agreed to proceed.   History of Present Illness:  10/06/18 Terri Barajas is a 73 y.o. female for follow up os OSA on CPAP.  Terri Barajas reports that she has had some difficulty with her mask.  She was using a full facemask but reports that she could not tolerate this.  She went back to her old nasal mask but is uncertain if it is fitting as it should.  She always uses CPAP when she goes to sleep but takes it off shortly after.  She is requesting a mask refitting.  09/04/2018 - 10/03/2018 Usage 09/04/2018 - 10/03/2018 Usage days 13/30 days (43%) >= 4 hours 5 days (17%) < 4 hours 8 days (27%) Usage hours 49 hours 26 minutes Average usage (total days) 1 hours 39 minutes Average usage (days used) 3 hours 48 minutes Median usage (days used) 3 hours 23 minutes Total used hours (value since last reset - 10/03/2018) 51 hours AirSense 10 AutoSet Serial number [id number] Mode AutoSet Min Pressure 5 cmH2O Max Pressure 12 cmH2O EPR Fulltime EPR level 3 Response Standard Therapy Pressure - cmH2O Median: 8.1 95th percentile: 10.5 Maximum: 10.9 Leaks - L/min Median: 12.1 95th percentile: 36.7 Maximum: 62.7 Events per hour AI: 1.5 HI: 0.9 AHI: 2.4 Apnea Index Central: 0.7 Obstructive: 0.2 Unknown: 0.5 RERA Index 0.7 Cheyne-Stokes respiration (average duration per night) 0 minutes  (0%)   History (copied from Dr Edwena Felty note on 06/30/2018)  HPI:  Terri Barajas is a 73 y.o.AA female patient , recently moved from [place] and seen here on 06-30-2018 in a referral from Dr. Juleen China for a sleep evaluation. The patient considers herself a mouth breather, her husband stated she snores. She has woken up with a very dry mouth and sore throat in November , while heating the house. She drank water through the night. The dry mouth disappeared in December.  Chief complaint according to patient : " I am very cold natured and have a dry mouth".   Sleep habits are as follows: dinner time for Terri Barajas is around 5-6 PM. She is retired as is her husband. She  Spends evenings watching TV, doing housekeeping. He would go to bed at 10 Pm .  She sleeps easily and wakes up once for nocturia. She may fall asleep on the sofa watching TV.  Sleeps on average 6-8 hours, wakes up spontaneously in AM when her husband leaves for work. Feeling refreshed, no headaches, no nausea.   Sleep medical history: no tonsillectomy, snoring , but no apnea being witnessed.   Family sleep history: sister with OSA,  one of 12 children in [place], [place]. Parents may have snored.   Social history: no night shift work history. She worked at [employer] for 48 years, mostly  in Press photographer. Married with 2 children 25 and 78.  She has an exercise room at the condo complex, she can't get  access to the machines.  Non smoker, non drinker, caffeine : rare sodas, no iced tea, in winter some hot chocolate. Hot black or green tea in winter decaffeinated.     Observations/Objective:  Generalized: Well developed, in no acute distress  Mentation: Alert oriented to time, place, history taking. Follows all commands speech and language fluent   Assessment and Plan:  73 y.o. year old female  has a past medical history of Anxiety, Arthritis, Complication of anesthesia, Dysrhythmia, Family history of  adverse reaction to anesthesia, GERD (gastroesophageal reflux disease), Glaucoma, H/O: hysterectomy (1978), Heart disease, History of hiatal hernia, Hypertension, MVP (mitral valve prolapse), Panic attacks, and Pre-diabetes. here with    ICD-10-CM   1. Moderate obstructive sleep apnea G47.33 For home use only DME continuous positive airway pressure (CPAP)   Terri Barajas has had some difficulty with her mask and complying with CPAP therapy.  She feels that mask refitting with how she does.  We have discussed risk of untreated sleep apnea.  I have advised that she use her machine every night and for greater than 4 hours each night.  We will order a mask refitting and follow-up closely.  I have recommended a 57-month follow-up.  She verbalizes understanding and agreement with this plan.  Orders Placed This Encounter  Procedures  . For home use only DME continuous positive airway pressure (CPAP)    Mask refitting    Order Specific Question:   Patient has OSA or probable OSA    Answer:   Yes    Order Specific Question:   Is the patient currently using CPAP in the home    Answer:   Yes    Order Specific Question:   Settings    Answer:   Other see comments    Order Specific Question:   CPAP supplies needed    Answer:   Mask, headgear, cushions, filters, heated tubing and water chamber    No orders of the defined types were placed in this encounter.    Follow Up Instructions:  I discussed the assessment and treatment plan with the patient. The patient was provided an opportunity to ask questions and all were answered. The patient agreed with the plan and demonstrated an understanding of the instructions.   The patient was advised to call back or seek an in-person evaluation if the symptoms worsen or if the condition fails to improve as anticipated.  I provided 25 minutes of non-face-to-face time during this encounter.  Patient is located at her place of residence during teleconference.   Provider is located at her place of residence.  Liane Comber, RN helped to facilitate visit.   Terri Presto, NP

## 2018-10-13 ENCOUNTER — Telehealth: Payer: Self-pay | Admitting: Cardiovascular Disease

## 2018-10-13 NOTE — Telephone Encounter (Signed)
Smartphone/consent/ my chart/ pre reg completed °

## 2018-10-14 ENCOUNTER — Telehealth (INDEPENDENT_AMBULATORY_CARE_PROVIDER_SITE_OTHER): Payer: Medicare Other | Admitting: Cardiovascular Disease

## 2018-10-14 ENCOUNTER — Encounter: Payer: Self-pay | Admitting: Cardiovascular Disease

## 2018-10-14 DIAGNOSIS — I10 Essential (primary) hypertension: Secondary | ICD-10-CM

## 2018-10-14 DIAGNOSIS — I341 Nonrheumatic mitral (valve) prolapse: Secondary | ICD-10-CM

## 2018-10-14 DIAGNOSIS — E7800 Pure hypercholesterolemia, unspecified: Secondary | ICD-10-CM | POA: Diagnosis not present

## 2018-10-14 DIAGNOSIS — I700 Atherosclerosis of aorta: Secondary | ICD-10-CM | POA: Diagnosis not present

## 2018-10-14 DIAGNOSIS — R002 Palpitations: Secondary | ICD-10-CM | POA: Diagnosis not present

## 2018-10-14 NOTE — Progress Notes (Signed)
Virtual Visit via Video Note   A video visit was attempted and  This visit type was conducted due to national recommendations for restrictions regarding the COVID-19 Pandemic (e.g. social distancing) in an effort to limit this patient's exposure and mitigate transmission in our community.  Due to her co-morbid illnesses, this patient is at least at moderate risk for complications without adequate follow up.  This format is felt to be most appropriate for this patient at this time.  All issues noted in this document were discussed and addressed.  A limited physical exam was performed with this format.  Please refer to the patient's chart for her consent to telehealth for [facility].   Evaluation Performed:  Follow-up visit  Date:  10/14/2018   ID:  Nasya, Vincent May 24, 1946, MRN [id number]  Patient Location: Home Provider Location: Office  PCP:  Briscoe Deutscher, DO  Cardiologist:  Skeet Latch, MD  Electrophysiologist:  None   Chief Complaint:  Follow up  History of Present Illness:    TAHJ LINDSETH is a 73 y.o. female with hypertension, hyperlipidemia, Barrett's esophagus here for follow up.  She was initially seen 06/2018 for the evaluation of abnormal EKG.  Ms. Gatliff has a long history of mitral valve prolapse and was seeing a cardiologist in [place] before moving to [place].  She has had this diagnosis for at least 10 years.  She also has been treated with nadolol for palpitations.  She does not recall being told she had any specific arrhythmia diagnosis.  The nadolol has controlled it well.  According to her outside records she had an echo 09/2017 that revealed LVEF 60 to 65% with trivial mitral regurgitation and mild tricuspid regurgitation. No mitral valve prolapse was noted on this echo.  She reports having a stress test in 2019 that was performed to follow-up her mitral valve disease but she does not recall having any symptoms of chest pain or  shortness of breath at the time.  She had an EKG in January 2020 that revealed anterior T wave inversions.  At the time she was being seen in the ED for epigastric and arm pain.  She has a longstanding history of intermittent abdominal distention and bloating.  She is has undergone upper endoscopies and biopsies that revealed Barrett's esophagus gastritis.  On that particular day her epigastric discomfort was associated with left arm pain.  Cardiac enzymes were negative.  She was instructed to follow-up with cardiology as an outpatient. Ms. Kamp was referred for a cardiac CT-A but it could not be performed due to tachycardia.  Instead she had a [employer] 08/2018 that revealed LVEF 64% and no ischemia.  Since her last appointment Ms. Carithers has been doing well.  This morning she did have an episode of palpitations.  Her heart rate went up to 108 bpm.  It has been a long time since she had an episode like this.  Her insurance wanted her to switch from nadolol to metoprolol.  She tried taking it but did not like how it made her feel.  She felt tired, dizzy, and anxious.  She has resumed nadolol and is feeling much better.  This week she started doing exercises on Youtube.  She does aerobics, stretching, and bending exercises.  She has no exertional chest pain or shortness of breath.  She denies lower extremity edema, orthopnea, or PND.  She started using a CPAP machine 1 month ago and is struggling with this.  The  patient does not have symptoms concerning for COVID-19 infection (fever, chills, cough, or new shortness of breath).    Past Medical History:  Diagnosis Date   Anxiety    Arthritis    Complication of anesthesia    some type of sedation medication made her feel crazy   Dysrhythmia    Family history of adverse reaction to anesthesia    daughter has Malignant Hyperthermia hx -    GERD (gastroesophageal reflux disease)    Glaucoma    H/O: hysterectomy 1978   Heart  disease    History of hiatal hernia    Hypertension    MVP (mitral valve prolapse)    Panic attacks    Pre-diabetes    Past Surgical History:  Procedure Laterality Date   APPENDECTOMY  1972   BIOPSY  07/06/2018   Procedure: BIOPSY;  Surgeon: Irving Copas., MD;  Location: [facility];  Service: Gastroenterology;;   CATARACT EXTRACTION  2013   [place] and 1978   COLONOSCOPY     ESOPHAGOGASTRODUODENOSCOPY (EGD) WITH PROPOFOL N/A 07/06/2018   Procedure: ESOPHAGOGASTRODUODENOSCOPY (EGD) WITH PROPOFOL;  Surgeon: Irving Copas., MD;  Location: [facility];  Service: Gastroenterology;  Laterality: N/A;   HAND SURGERY Bilateral    HEMORRHOID SURGERY  2018   S/P Hysterectomy   1987     Current Meds  Medication Sig   ALPRAZolam (XANAX) 0.5 MG tablet Take 0.25 mg by mouth daily as needed for anxiety.    aspirin 81 MG chewable tablet Chew 81 mg by mouth daily.   BIOTIN PO Take 1 tablet by mouth daily.    Cholecalciferol (VITAMIN D3) 25 MCG (1000 UT) CAPS Take 1,000 Units by mouth daily.   dexlansoprazole (DEXILANT) 60 MG capsule Take 1 capsule (60 mg total) by mouth daily.   diphenhydrAMINE (BENADRYL) 50 MG capsule Take 50 mg by mouth as needed. Take 1 one hour prior to CT   hydrochlorothiazide (HYDRODIURIL) 25 MG tablet Take 25 mg by mouth every other day.    latanoprost (XALATAN) 0.005 % ophthalmic solution Place 1 drop into both eyes at bedtime.    Multiple Vitamins-Minerals (CENTRUM SILVER 50+WOMEN PO) Take 1 tablet by mouth daily.   nadolol (CORGARD) 20 MG tablet Take 1 tablet (20 mg total) by mouth daily.   Omega-3 Fatty Acids (FISH OIL PO) Take 1 capsule by mouth daily.   polycarbophil (FIBERCON) 625 MG tablet Take 625 mg by mouth daily.   polyethylene glycol (MIRALAX / GLYCOLAX) packet Take 17 g by mouth daily.   simethicone (MYLICON) 540 MG chewable tablet Chew 125 mg by mouth 3 (three) times daily as needed for  flatulence.   [DISCONTINUED] metoprolol tartrate (LOPRESSOR) 25 MG tablet Take 1 tablet (25 mg total) by mouth 2 (two) times daily.   [DISCONTINUED] predniSONE (DELTASONE) 50 MG tablet TAKE 1 TABLET 13 hours prior to test, another 7 hours prior to test, AND another 1 hour prior to test.     Allergies:   Atracurium & derivatives; Atropine; Iodine; Shellfish allergy; and Statins   Social History   Tobacco Use   Smoking status: Never Smoker   Smokeless tobacco: Never Used  Substance Use Topics   Alcohol use: Not Currently   Drug use: Not Currently     Family Hx: [name] family history includes Alcohol abuse in her father; Arthritis in her brother, sister, sister, and sister; Asthma in her sister; Colon polyps in her brother; Congenital heart disease in her brother; Diabetes  in her brother; Glaucoma in her brother; Hyperlipidemia in her brother; Hypertension in her brother and sister; Pancreatic cancer in her mother; Prostate cancer in her brother. There is no history of Colon cancer, Esophageal cancer, Inflammatory bowel disease, Liver disease, Rectal cancer, or Stomach cancer.  ROS:   Please see the history of present illness.     All other systems reviewed and are negative.   Prior CV studies:   The following studies were reviewed today:  Lexiscan Myoview 09/02/2018:  The left ventricular ejection fraction is normal (55-65%).  Nuclear stress EF: 64%.  There was no ST segment deviation noted during stress.  The study is normal.  This is a low risk study.   Normal pharmacologic nuclear stress test with no evidence for a prior infarct or ischemia. Normal LVEF.    Labs/Other Tests and Data Reviewed:    EKG:  An ECG dated 07/13/2018 was personally reviewed today and demonstrated:  Sinus rhythm.  Rate 70 bpm.  Nonspecific T wave abnormalities.  Recent Labs: 05/27/2018: TSH 1.21 07/10/2018: ALT 20; BUN 14; Creatinine, Ser 0.73; Hemoglobin 12.9; Platelets 234;  Potassium 3.6; Sodium 139   Recent Lipid Panel Lab Results  Component Value Date/Time   CHOL 270 (H) 05/27/2018 10:45 AM   TRIG 191.0 (H) 05/27/2018 10:45 AM   HDL 54.70 05/27/2018 10:45 AM   CHOLHDL 5 05/27/2018 10:45 AM   LDLCALC 177 (H) 05/27/2018 10:45 AM    Wt Readings from Last 3 Encounters:  10/14/18 146 lb (66.2 kg)  09/02/18 147 lb (66.7 kg)  07/29/18 147 lb (66.7 kg)     Objective:    BP 109/82    Pulse 93    Ht 5\' 3"  (1.6 m)    Wt 146 lb (66.2 kg)    LMP  (LMP Unknown)    BMI 25.86 kg/m  GENERAL: Well-appearing.  No acute distress. HEENT: Pupils equal round.  Oral mucosa unremarkable NECK:  No jugular venous distention, no visible thyromegaly EXT:  No edema, no cyanosis no clubbing SKIN:  No rashes no nodules NEURO:  Speech fluent.  Cranial nerves grossly intact.  Moves all 4 extremities freely PSYCH:  Cognitively intact, oriented to person place and time   ASSESSMENT & PLAN:    # Atypical chest pain: Symptoms were very atypical for ischemia and more consistent with GERD.  Resolved.  Lexiscan Myoview was negative for ischemia 08/2018.  # Hyperlipidemia: LDL was 177 on 05/2018.  ASCVD 10 year risk is 11.5%.  We discussed medication and lifestyle interventions.  She wants to work on diet and exercise first.  We will repeat lipid and CMP in 6 months.  If her ASCVD risk remains elevated she will try a statin at that time.  # Hypertension: Blood pressure is well-controlled on hydrochlorothiazide and nadolol.  # Mitral valve prolapse: There is no mention of mitral valve prolapse on her last 2 echocardiograms.  She had only trivial mitral irritation.  There is no evidence of heart failure on exam.  No need to repeat echocardiogram at this time.   COVID-19 Education: The signs and symptoms of COVID-19 were discussed with the patient and how to seek care for testing (follow up with PCP or arrange E-visit).  The importance of social distancing was discussed  today.  Time:   Today, I have spent 25 minutes with the patient with telehealth technology discussing the above problems.     Medication Adjustments/Labs and Tests Ordered: Current medicines are reviewed at length with  the patient today.  Concerns regarding medicines are outlined above.   Tests Ordered: No orders of the defined types were placed in this encounter.   Medication Changes: No orders of the defined types were placed in this encounter.   Disposition:  Follow up in 6 month(s)  Signed, Skeet Latch, MD  10/14/2018 10:32 AM    [facility]

## 2018-10-14 NOTE — Patient Instructions (Addendum)
Medication Instructions:  Your physician recommends that you continue on your current medications as directed. Please refer to the Current Medication list given to you today.  If you need a refill on your cardiac medications before your next appointment, please call your pharmacy.   Lab work: FASTING LP/CMET IN 6 MONTHS   If you have labs (blood work) drawn today and your tests are completely normal, you will receive your results only by:  [place] (if you have MyChart) OR  A paper copy in the mail If you have any lab test that is abnormal or we need to change your treatment, we will call you to review the results.  Testing/Procedures: NONE  Follow-Up: At [facility], you and your health needs are our priority.  As part of our continuing mission to provide you with exceptional heart care, we have created designated Provider Care Teams.  These Care Teams include your primary Cardiologist (physician) and Advanced Practice Providers (APPs -  Physician Assistants and Nurse Practitioners) who all work together to provide you with the care you need, when you need it. You will need a follow up appointment in 6 months.  Please call our office 2 months in advance to schedule this appointment.  You may see Skeet Latch, MD or one of the following Advanced Practice Providers on your designated Care Team:   Kerin Ransom, PA-C Roby Lofts, PA-C  Sande Rives, [place]  Any Other Special Instructions Will Be Listed Below (If Applicable). TRY TO EXERCISE 150 MINUTES EACH WEEK     Mediterranean Diet A Mediterranean diet refers to food and lifestyle choices that are based on the traditions of countries located on the [employer]. This way of eating has been shown to help prevent certain conditions and improve outcomes for people who have chronic diseases, like kidney disease and heart disease. What are tips for following this plan? Lifestyle  Cook and eat meals together with  your family, when possible.  Drink enough fluid to keep your urine clear or pale yellow.  Be physically active every day. This includes: ? Aerobic exercise like running or swimming. ? Leisure activities like gardening, walking, or housework.  Get 7-8 hours of sleep each night.  If recommended by your health care provider, drink red wine in moderation. This means 1 glass a day for nonpregnant women and 2 glasses a day for men. A glass of wine equals 5 oz (150 mL). Reading food labels   Check the serving size of packaged foods. For foods such as rice and pasta, the serving size refers to the amount of cooked product, not dry.  Check the total fat in packaged foods. Avoid foods that have saturated fat or trans fats.  Check the ingredients list for added sugars, such as corn syrup. Shopping  At the grocery store, buy most of your food from the areas near the walls of the store. This includes: ? Fresh fruits and vegetables (produce). ? Grains, beans, nuts, and seeds. Some of these may be available in unpackaged forms or large amounts (in bulk). ? Fresh seafood. ? Poultry and eggs. ? Low-fat dairy products.  Buy whole ingredients instead of prepackaged foods.  Buy fresh fruits and vegetables in-season from local farmers markets.  Buy frozen fruits and vegetables in resealable bags.  If you do not have access to quality fresh seafood, buy precooked frozen shrimp or canned fish, such as tuna, salmon, or sardines.  Buy small amounts of raw or cooked vegetables, salads, or  olives from the deli or salad bar at your store.  Stock your pantry so you always have certain foods on hand, such as olive oil, canned tuna, canned tomatoes, rice, pasta, and beans. Cooking  Cook foods with extra-virgin olive oil instead of using butter or other vegetable oils.  Have meat as a side dish, and have vegetables or grains as your main dish. This means having meat in small portions or adding small  amounts of meat to foods like pasta or stew.  Use beans or vegetables instead of meat in common dishes like chili or lasagna.  Experiment with different cooking methods. Try roasting or broiling vegetables instead of steaming or sauteing them.  Add frozen vegetables to soups, stews, pasta, or rice.  Add nuts or seeds for added healthy fat at each meal. You can add these to yogurt, salads, or vegetable dishes.  Marinate fish or vegetables using olive oil, lemon juice, garlic, and fresh herbs. Meal planning   Plan to eat 1 vegetarian meal one day each week. Try to work up to 2 vegetarian meals, if possible.  Eat seafood 2 or more times a week.  Have healthy snacks readily available, such as: ? Vegetable sticks with hummus. ? [place] yogurt. ? Fruit and nut trail mix.  Eat balanced meals throughout the week. This includes: ? Fruit: 2-3 servings a day ? Vegetables: 4-5 servings a day ? Low-fat dairy: 2 servings a day ? Fish, poultry, or lean meat: 1 serving a day ? Beans and legumes: 2 or more servings a week ? Nuts and seeds: 1-2 servings a day ? Whole grains: 6-8 servings a day ? Extra-virgin olive oil: 3-4 servings a day  Limit red meat and sweets to only a few servings a month What are my food choices?  Mediterranean diet ? Recommended ? Grains: Whole-grain pasta. Brown rice. Bulgar wheat. Polenta. Couscous. Whole-wheat bread. [place]. ? Vegetables: Artichokes. Beets. Broccoli. Cabbage. Carrots. Eggplant. Green beans. Chard. Kale. Spinach. Onions. Leeks. Peas. Squash. Tomatoes. Peppers. Radishes. ? Fruits: Apples. Apricots. Avocado. Berries. Bananas. Cherries. Dates. Figs. Grapes. Lemons. Melon. Oranges. Peaches. Plums. Pomegranate. ? Meats and other protein foods: Beans. Almonds. Sunflower seeds. Pine nuts. Peanuts. [place]. Salmon. Scallops. Shrimp. [place]. Tilapia. Clams. Oysters. Eggs. ? Dairy: Low-fat milk. Cheese. Greek yogurt. ? Beverages: Water. Red wine. Herbal  tea. ? Fats and oils: Extra virgin olive oil. Avocado oil. Grape seed oil. ? Sweets and desserts: [place] yogurt with honey. Baked apples. Poached pears. Trail mix. ? Seasoning and other foods: Basil. Cilantro. Coriander. Cumin. Mint. Parsley. Sage. [name]. Tarragon. Garlic. Oregano. Thyme. Pepper. Balsalmic vinegar. Tahini. Hummus. Tomato sauce. Olives. Mushrooms. ? Limit these ? Grains: Prepackaged pasta or rice dishes. Prepackaged cereal with added sugar. ? Vegetables: Deep fried potatoes (french fries). ? Fruits: Fruit canned in syrup. ? Meats and other protein foods: Beef. Pork. Lamb. Poultry with skin. Hot dogs. [place] Salines. ? Dairy: Ice cream. Sour cream. Whole milk. ? Beverages: Juice. Sugar-sweetened soft drinks. Beer. Liquor and spirits. ? Fats and oils: Butter. Canola oil. Vegetable oil. Beef fat (tallow). Lard. ? Sweets and desserts: Cookies. Cakes. Pies. Candy. ? Seasoning and other foods: Mayonnaise. Premade sauces and marinades. ? The items listed may not be a complete list. Talk with your dietitian about what dietary choices are right for you. Summary  The Mediterranean diet includes both food and lifestyle choices.  Eat a variety of fresh fruits and vegetables, beans, nuts, seeds, and whole grains.  Limit the amount of red  meat and sweets that you eat.  Talk with your health care provider about whether it is safe for you to drink red wine in moderation. This means 1 glass a day for nonpregnant women and 2 glasses a day for men. A glass of wine equals 5 oz (150 mL). This information is not intended to replace advice given to you by your health care provider. Make sure you discuss any questions you have with your health care provider. Document Released: 01/25/2016 Document Revised: 02/27/2016 Document Reviewed: 01/25/2016 Elsevier Interactive Patient Education  2019 [employer].

## 2018-10-21 DIAGNOSIS — G4733 Obstructive sleep apnea (adult) (pediatric): Secondary | ICD-10-CM | POA: Diagnosis not present

## 2018-10-27 ENCOUNTER — Telehealth: Payer: Self-pay | Admitting: Family Medicine

## 2018-10-27 DIAGNOSIS — M79673 Pain in unspecified foot: Secondary | ICD-10-CM

## 2018-10-27 NOTE — Telephone Encounter (Signed)
Okay referral.

## 2018-10-27 NOTE — Telephone Encounter (Signed)
Copied from [place]. Topic: Referral - Request for Referral >> Oct 27, 2018  1:17 PM Selinda Flavin B, [place] wrote: Has patient seen PCP for this complaint? No, not since leaving [place] PCP  *If NO, is insurance requiring patient see PCP for this issue before PCP can refer them? Referral for which specialty: Podiatry Preferred provider/office: n/a Reason for referral: inflamed heel per husband

## 2018-10-27 NOTE — Telephone Encounter (Signed)
Do you have a preference of podiatrist for Terri Barajas?

## 2018-10-27 NOTE — Telephone Encounter (Signed)
Nope. Wherever we normally send.

## 2018-10-27 NOTE — Telephone Encounter (Signed)
Do you want to do app or just send for referral?

## 2018-10-28 NOTE — Telephone Encounter (Signed)
Referral has been placed. 

## 2018-11-02 ENCOUNTER — Ambulatory Visit: Payer: Medicare Other | Admitting: Podiatry

## 2018-11-02 ENCOUNTER — Telehealth: Payer: Self-pay | Admitting: General Surgery

## 2018-11-02 ENCOUNTER — Other Ambulatory Visit: Payer: Self-pay

## 2018-11-02 ENCOUNTER — Ambulatory Visit (INDEPENDENT_AMBULATORY_CARE_PROVIDER_SITE_OTHER): Payer: Medicare Other

## 2018-11-02 ENCOUNTER — Encounter: Payer: Self-pay | Admitting: Podiatry

## 2018-11-02 VITALS — BP 136/80 | HR 72 | Temp 97.5°F

## 2018-11-02 DIAGNOSIS — M722 Plantar fascial fibromatosis: Secondary | ICD-10-CM | POA: Diagnosis not present

## 2018-11-02 NOTE — Telephone Encounter (Signed)
Contacted the patient to pre-screen virtual appointment scheduled 11/03/2018. The patient stated that she is feeling fine and does not want to keep this appointment. I asked if she would like to reschedule and she does not at this time.

## 2018-11-03 ENCOUNTER — Ambulatory Visit: Payer: Medicare Other | Admitting: Gastroenterology

## 2018-11-03 ENCOUNTER — Telehealth: Payer: Self-pay | Admitting: Podiatry

## 2018-11-03 NOTE — Telephone Encounter (Signed)
Pt was suppose to receive medication at her pharmacy yesterday. It was suppose to be 2 medications- a steroid and another type of medication. Please call patient.

## 2018-11-04 ENCOUNTER — Telehealth: Payer: Self-pay

## 2018-11-04 NOTE — Telephone Encounter (Signed)
Spoke with the patient and she has been scheduled for her 3 month follow up with Amy on 01/06/2019 at 9:30 am. Patient is aware of appt and time.

## 2018-11-04 NOTE — Progress Notes (Signed)
   Subjective: 73 y.o. female presenting today as a new patient with a chief complaint of a plantar fasciitis flare up of the right foot. Patient states she just recently moved down from [place] and was previously diagnosed there. She has a CAM boot and orthotics and has received injections in the past which help alleviate the pain. Walking and being on her feet for long periods of time increases the pain. Patient is here for further evaluation and treatment.   Past Medical History:  Diagnosis Date  . Anxiety   . Arthritis   . Complication of anesthesia    some type of sedation medication made her feel crazy  . Dysrhythmia   . Family history of adverse reaction to anesthesia    daughter has Malignant Hyperthermia hx -   . GERD (gastroesophageal reflux disease)   . Glaucoma   . H/O: hysterectomy 1978  . Heart disease   . History of hiatal hernia   . Hypertension   . MVP (mitral valve prolapse)   . Panic attacks   . Pre-diabetes      Objective: Physical Exam General: The patient is alert and oriented x3 in no acute distress.  Dermatology: Skin is warm, dry and supple bilateral lower extremities. Negative for open lesions or macerations bilateral.   Vascular: Dorsalis Pedis and Posterior Tibial pulses palpable bilateral.  Capillary fill time is immediate to all digits.  Neurological: Epicritic and protective threshold intact bilateral.   Musculoskeletal: Tenderness to palpation to the plantar aspect of the right heel along the plantar fascia. All other joints range of motion within normal limits bilateral. Strength 5/5 in all groups bilateral.   Radiographic exam: Normal osseous mineralization. Joint spaces preserved. No fracture/dislocation/boney destruction. No other soft tissue abnormalities or radiopaque foreign bodies.   Assessment: 1. Plantar fasciitis right  Plan of Care:  1. Patient evaluated. Xrays reviewed.   2. Injection of 0.5cc Celestone soluspan injected  into the right plantar fascia  3. Rx for Medrol Dose Pack placed 4. Rx for Meloxicam ordered for patient. 5. Plantar fascial band(s) dispensed 6. Instructed patient regarding therapies and modalities at home to alleviate symptoms.  7. Continue using custom orthotics and night splint.  8. Return to clinic in 4 weeks.    Recently moved from [place].    Edrick Kins, DPM [facility]  Dr. Edrick Kins, DPM    [address], [place] [address]                Office [phone]  Fax [phone]

## 2018-11-05 ENCOUNTER — Telehealth: Payer: Self-pay | Admitting: *Deleted

## 2018-11-05 MED ORDER — MELOXICAM 15 MG PO TABS: 15.0000 mg | ORAL_TABLET | Freq: Every day | ORAL | 0 refills | Status: DC

## 2018-11-05 MED ORDER — METHYLPREDNISOLONE 4 MG PO TBPK: ORAL_TABLET | ORAL | 0 refills | Status: DC

## 2018-11-05 NOTE — Telephone Encounter (Signed)
Orders completed in 11/05/2018 Telephone Call.

## 2018-11-05 NOTE — Telephone Encounter (Addendum)
Pt called states this is her 3rd call. I reviewed Chart Review, pt called 11/03/2018 and I messaged Dr. Amalia Hailey due to no clinical or orders for 11/02/2018. I reviewed 11/02/2018 clinicals that were completed 11/04/2018 1:34pm by Dr. Amalia Hailey and he did want Medrol dose pack ordered and after completion to be followed by Meloxicam 15mg . I told pt if needed additional pain coverage, if she was able to tolerate OTC regular strength Tylenol she could take as the package instructs. I apologized for the delay. Pt states understanding.

## 2018-11-20 DIAGNOSIS — G4733 Obstructive sleep apnea (adult) (pediatric): Secondary | ICD-10-CM | POA: Diagnosis not present

## 2018-11-21 DIAGNOSIS — G4733 Obstructive sleep apnea (adult) (pediatric): Secondary | ICD-10-CM | POA: Diagnosis not present

## 2018-11-21 NOTE — Progress Notes (Signed)
Virtual Visit via Video   Due to the COVID-19 pandemic, this visit was completed with telemedicine (audio/video) technology to reduce patient and provider exposure as well as to preserve personal protective equipment.   I connected with Terri Barajas by a video enabled telemedicine application and verified that I am speaking with the correct person using two identifiers. Location patient: Home Location provider: [facility], Office Persons participating in the virtual visit: Ligaya, Cormier, DO Lonell Grandchild, CMA acting as scribe for Dr. Briscoe Deutscher.   I discussed the limitations of evaluation and management by telemedicine and the availability of in person appointments. The patient expressed understanding and agreed to proceed.  Care Team   Patient Care Team: Briscoe Deutscher, DO as PCP - General (Family Medicine) Skeet Latch, MD as PCP - Cardiology (Cardiology) Rossie Muskrat, MD as Referring Physician (Podiatry)  Subjective:   HPI: Patient in for follow up. No new problems or concerns. She is on mobic for heel pain. Seeing podiatry for that. Feels great since stating simethicone for GI issues.   Lab Results  Component Value Date   CHOL 270 (H) 05/27/2018   HDL 54.70 05/27/2018   LDLCALC 177 (H) 05/27/2018   TRIG 191.0 (H) 05/27/2018   CHOLHDL 5 05/27/2018   Lab Results  Component Value Date   ALT 20 07/10/2018   AST 21 07/10/2018   ALKPHOS 61 07/10/2018   BILITOT 0.6 07/10/2018   Review of Systems  Constitutional: Negative for chills and fever.  HENT: Negative for ear pain, hearing loss and tinnitus.   Eyes: Negative for blurred vision and double vision.  Respiratory: Negative for cough.   Cardiovascular: Negative for chest pain and palpitations.  Gastrointestinal: Negative for heartburn, nausea and vomiting.  Genitourinary: Negative for dysuria, frequency and urgency.  Neurological: Negative for dizziness.  Endo/Heme/Allergies:  Negative for environmental allergies. Does not bruise/bleed easily.  Psychiatric/Behavioral: Negative for depression and suicidal ideas.    Patient Active Problem List   Diagnosis Date Noted  . Abnormal CT of the abdomen 10/04/2018  . Radiculopathy affecting upper extremity, left 07/25/2018  . Hyperlipidemia 05/13/2018  . Situational anxiety 04/29/2018  . MVP (mitral valve prolapse) 04/29/2018  . Sleep-disordered breathing 04/29/2018  . Family History of Hyperthermia, malignant 04/23/2018  . Barrett's esophagus 03/01/2018  . Elevated lipase 02/18/2018  . Hiatal hernia 02/18/2018  . Constipation 02/18/2018  . Bloating 02/18/2018  . Aortic atherosclerosis ([place]) 01/23/2018  . GERD (gastroesophageal reflux disease)   . Glaucoma   . Hypertension     Social History   Tobacco Use  . Smoking status: Never Smoker  . Smokeless tobacco: Never Used  Substance Use Topics  . Alcohol use: Not Currently    Current Outpatient Medications:  .  ALPRAZolam (XANAX) 0.5 MG tablet, Take 0.25 mg by mouth daily as needed for anxiety. , Disp: , Rfl:  .  aspirin 81 MG chewable tablet, Chew 81 mg by mouth daily., Disp: , Rfl:  .  BIOTIN PO, Take 1 tablet by mouth daily. , Disp: , Rfl:  .  Cholecalciferol (VITAMIN D3) 25 MCG (1000 UT) CAPS, Take 1,000 Units by mouth daily., Disp: , Rfl:  .  dexlansoprazole (DEXILANT) 60 MG capsule, Take 1 capsule (60 mg total) by mouth daily., Disp: 30 capsule, Rfl: 11 .  hydrochlorothiazide (HYDRODIURIL) 25 MG tablet, Take 25 mg by mouth every other day. , Disp: , Rfl:  .  latanoprost (XALATAN) 0.005 % ophthalmic solution, Place 1 drop  into both eyes at bedtime. , Disp: , Rfl:  .  meloxicam (MOBIC) 15 MG tablet, Take 1 tablet (15 mg total) by mouth daily., Disp: 30 tablet, Rfl: 0 .  Multiple Vitamins-Minerals (CENTRUM SILVER 50+WOMEN PO), Take 1 tablet by mouth daily., Disp: , Rfl:  .  Omega-3 Fatty Acids (FISH OIL PO), Take 1 capsule by mouth daily., Disp: , Rfl:  .   polycarbophil (FIBERCON) 625 MG tablet, Take 625 mg by mouth daily., Disp: , Rfl:  .  polyethylene glycol (MIRALAX / GLYCOLAX) packet, Take 17 g by mouth daily., Disp: , Rfl:  .  simethicone (MYLICON) 220 MG chewable tablet, Chew 125 mg by mouth 3 (three) times daily as needed for flatulence., Disp: , Rfl:   Allergies  Allergen Reactions  . Atracurium & Derivatives Other (See Comments)    Unknown  . Atropine Other (See Comments)    Heart racing  . Iodine Other (See Comments)    Unknown  . Shellfish Allergy Other (See Comments)    Unknown  . Statins     "didn't feel right"    Objective:   VITALS: Per patient if applicable, see vitals. GENERAL: Alert, appears well and in no acute distress. HEENT: Atraumatic, conjunctiva clear, no obvious abnormalities on inspection of external nose and ears. NECK: Normal movements of the head and neck. CARDIOPULMONARY: No increased WOB. Speaking in clear sentences. I:E ratio WNL.  MS: Moves all visible extremities without noticeable abnormality. PSYCH: Pleasant and cooperative, well-groomed. Speech normal rate and rhythm. Affect is appropriate. Insight and judgement are appropriate. Attention is focused, linear, and appropriate.  NEURO: CN grossly intact. Oriented as arrived to appointment on time with no prompting. Moves both UE equally.  SKIN: No obvious lesions, wounds, erythema, or cyanosis noted on face or hands.  Depression screen [facility] 2/9 11/23/2018 07/21/2018 07/16/2018  Decreased Interest 0 2 0  Down, Depressed, Hopeless 0 2 0  PHQ - 2 Score 0 4 0  Altered sleeping 0 0 -  Tired, decreased energy 0 0 -  Change in appetite 0 1 -  Feeling bad or failure about yourself  0 1 -  Trouble concentrating 0 0 -  Moving slowly or fidgety/restless 0 0 -  Suicidal thoughts 0 0 -  PHQ-9 Score 0 6 -  Difficult doing work/chores Not difficult at all Somewhat difficult -    Assessment and Plan:   Terri Barajas was seen today for follow-up.  Diagnoses and all  orders for this visit:  Essential hypertension -     Comprehensive metabolic panel; Future  Pure hypercholesterolemia -     Comprehensive metabolic panel; Future -     Lipid panel; Future  Gastroesophageal reflux disease without esophagitis -     CBC with Differential/Platelet; Future  Hyperglycemia -     Hemoglobin A1c; Future   . COVID-19 Education: The signs and symptoms of COVID-19 were discussed with the patient and how to seek care for testing if needed. The importance of social distancing was discussed today. . Reviewed expectations re: course of current medical issues. . Discussed self-management of symptoms. . Outlined signs and symptoms indicating need for more acute intervention. . Patient verbalized understanding and all questions were answered. Marland Kitchen Health Maintenance issues including appropriate healthy diet, exercise, and smoking avoidance were discussed with patient. . See orders for this visit as documented in the electronic medical record.  Briscoe Deutscher, DO  Records requested if needed. Time spent: 25 minutes, of which >50% was spent in  obtaining information about her symptoms, reviewing her previous labs, evaluations, and treatments, counseling her about her condition (please see the discussed topics above), and developing a plan to further investigate it; she had a number of questions which I addressed.

## 2018-11-23 ENCOUNTER — Encounter: Payer: Self-pay | Admitting: Family Medicine

## 2018-11-23 ENCOUNTER — Other Ambulatory Visit: Payer: Self-pay

## 2018-11-23 ENCOUNTER — Ambulatory Visit (INDEPENDENT_AMBULATORY_CARE_PROVIDER_SITE_OTHER): Payer: Medicare Other | Admitting: Family Medicine

## 2018-11-23 VITALS — BP 115/82 | HR 81 | Temp 97.4°F | Ht 63.0 in | Wt 153.0 lb

## 2018-11-23 DIAGNOSIS — I10 Essential (primary) hypertension: Secondary | ICD-10-CM

## 2018-11-23 DIAGNOSIS — E7800 Pure hypercholesterolemia, unspecified: Secondary | ICD-10-CM | POA: Diagnosis not present

## 2018-11-23 DIAGNOSIS — R739 Hyperglycemia, unspecified: Secondary | ICD-10-CM

## 2018-11-23 DIAGNOSIS — K219 Gastro-esophageal reflux disease without esophagitis: Secondary | ICD-10-CM

## 2018-11-25 ENCOUNTER — Encounter: Payer: Self-pay | Admitting: Family Medicine

## 2018-12-02 ENCOUNTER — Ambulatory Visit: Payer: Medicare Other | Admitting: Podiatry

## 2018-12-02 ENCOUNTER — Encounter: Payer: Self-pay | Admitting: Podiatry

## 2018-12-02 ENCOUNTER — Other Ambulatory Visit: Payer: Self-pay

## 2018-12-02 VITALS — Temp 97.6°F

## 2018-12-02 DIAGNOSIS — M722 Plantar fascial fibromatosis: Secondary | ICD-10-CM

## 2018-12-02 NOTE — Patient Instructions (Signed)
[employer] Women's Shoes

## 2018-12-05 NOTE — Progress Notes (Signed)
   Subjective: 73 y.o. female presenting today for follow up evaluation of plantar fasciitis of the right foot. She reports some continued soreness. She has been using the inserts but does not like them. She has been using the fascial brace and states she thinks it has been helping. There are no worsening factors noted. Patient is here for further evaluation and treatment.   Past Medical History:  Diagnosis Date  . Anxiety   . Arthritis   . Complication of anesthesia    some type of sedation medication made her feel crazy  . Dysrhythmia   . Family history of adverse reaction to anesthesia    daughter has Malignant Hyperthermia hx -   . GERD (gastroesophageal reflux disease)   . Glaucoma   . H/O: hysterectomy 1978  . Heart disease   . History of hiatal hernia   . Hypertension   . MVP (mitral valve prolapse)   . Panic attacks   . Pre-diabetes      Objective: Physical Exam General: The patient is alert and oriented x3 in no acute distress.  Dermatology: Skin is warm, dry and supple bilateral lower extremities. Negative for open lesions or macerations bilateral.   Vascular: Dorsalis Pedis and Posterior Tibial pulses palpable bilateral.  Capillary fill time is immediate to all digits.  Neurological: Epicritic and protective threshold intact bilateral.   Musculoskeletal: Tenderness to palpation to the plantar aspect of the right heel along the plantar fascia. All other joints range of motion within normal limits bilateral. Strength 5/5 in all groups bilateral.   Assessment: 1. Plantar fasciitis right  Plan of Care:  1. Patient evaluated.    2. Injection of 0.5cc Celestone soluspan injected into the right plantar fascia  3. Continue taking Meloxicam.  4. Continue using custom orthotics she got in [place].  5. Continue using plantar fascial brace as needed.  6. Recommended Vionic shoes.  7. Return to clinic in 4 weeks.   Recently moved from [place].    Edrick Kins, DPM [facility]  Dr. Edrick Kins, DPM    [address], [place] [address]                Office [phone]  Fax [phone]

## 2018-12-21 DIAGNOSIS — G4733 Obstructive sleep apnea (adult) (pediatric): Secondary | ICD-10-CM | POA: Diagnosis not present

## 2019-01-06 ENCOUNTER — Other Ambulatory Visit: Payer: Self-pay

## 2019-01-06 ENCOUNTER — Ambulatory Visit: Payer: Medicare Other | Admitting: Family Medicine

## 2019-01-06 ENCOUNTER — Encounter: Payer: Self-pay | Admitting: Family Medicine

## 2019-01-06 DIAGNOSIS — Z9989 Dependence on other enabling machines and devices: Secondary | ICD-10-CM

## 2019-01-06 DIAGNOSIS — G4733 Obstructive sleep apnea (adult) (pediatric): Secondary | ICD-10-CM | POA: Insufficient documentation

## 2019-01-06 NOTE — Patient Instructions (Signed)
Continue using CPAP nightly and for greater than 4 hours each night  Call DME for large mask/call my office for any concerns  Follow up in 6 months   Sleep Apnea Sleep apnea affects breathing during sleep. It causes breathing to stop for a short time or to become shallow. It can also increase the risk of:  Heart attack.  Stroke.  Being very overweight (obese).  Diabetes.  Heart failure.  Irregular heartbeat. The goal of treatment is to help you breathe normally again. What are the causes? There are three kinds of sleep apnea:  Obstructive sleep apnea. This is caused by a blocked or collapsed airway.  Central sleep apnea. This happens when the brain does not send the right signals to the muscles that control breathing.  Mixed sleep apnea. This is a combination of obstructive and central sleep apnea. The most common cause of this condition is a collapsed or blocked airway. This can happen if:  Your throat muscles are too relaxed.  Your tongue and tonsils are too large.  You are overweight.  Your airway is too small. What increases the risk?  Being overweight.  Smoking.  Having a small airway.  Being older.  Being female.  Drinking alcohol.  Taking medicines to calm yourself (sedatives or tranquilizers).  Having family members with the condition. What are the signs or symptoms?  Trouble staying asleep.  Being sleepy or tired during the day.  Getting angry a lot.  Loud snoring.  Headaches in the morning.  Not being able to focus your mind (concentrate).  Forgetting things.  Less interest in sex.  Mood swings.  Personality changes.  Feelings of sadness (depression).  Waking up a lot during the night to pee (urinate).  Dry mouth.  Sore throat. How is this diagnosed?  Your medical history.  A physical exam.  A test that is done when you are sleeping (sleep study). The test is most often done in a sleep lab but may also be done at  home. How is this treated?   Sleeping on your side.  Using a medicine to get rid of mucus in your nose (decongestant).  Avoiding the use of alcohol, medicines to help you relax, or certain pain medicines (narcotics).  Losing weight, if needed.  Changing your diet.  Not smoking.  Using a machine to open your airway while you sleep, such as: ? An oral appliance. This is a mouthpiece that shifts your lower jaw forward. ? A CPAP device. This device blows air through a mask when you breathe out (exhale). ? An EPAP device. This has valves that you put in each nostril. ? A BPAP device. This device blows air through a mask when you breathe in (inhale) and breathe out.  Having surgery if other treatments do not work. It is important to get treatment for sleep apnea. Without treatment, it can lead to:  High blood pressure.  Coronary artery disease.  In men, not being able to have an erection (impotence).  Reduced thinking ability. Follow these instructions at home: Lifestyle  Make changes that your doctor recommends.  Eat a healthy diet.  Lose weight if needed.  Avoid alcohol, medicines to help you relax, and some pain medicines.  Do not use any products that contain nicotine or tobacco, such as cigarettes, e-cigarettes, and chewing tobacco. If you need help quitting, ask your doctor. General instructions  Take over-the-counter and prescription medicines only as told by your doctor.  If you were given a  machine to use while you sleep, use it only as told by your doctor.  If you are having surgery, make sure to tell your doctor you have sleep apnea. You may need to bring your device with you.  Keep all follow-up visits as told by your doctor. This is important. Contact a doctor if:  The machine that you were given to use during sleep bothers you or does not seem to be working.  You do not get better.  You get worse. Get help right away if:  Your chest hurts.  You  have trouble breathing in enough air.  You have an uncomfortable feeling in your back, arms, or stomach.  You have trouble talking.  One side of your body feels weak.  A part of your face is hanging down. These symptoms may be an emergency. Do not wait to see if the symptoms will go away. Get medical help right away. Call your local emergency services (911 in the [place] Do not drive yourself to the hospital. Summary  This condition affects breathing during sleep.  The most common cause is a collapsed or blocked airway.  The goal of treatment is to help you breathe normally while you sleep. This information is not intended to replace advice given to you by your health care provider. Make sure you discuss any questions you have with your health care provider. Document Released: 03/12/2008 Document Revised: 03/20/2018 Document Reviewed: 01/27/2018 Elsevier Patient Education  2020 [employer].

## 2019-01-06 NOTE — Progress Notes (Signed)
PATIENT: Terri Barajas DOB: [birth year]  REASON FOR VISIT: follow up HISTORY FROM: patient  Chief Complaint  Patient presents with  . Follow-up    3 mon f/u. Alone. Rm 2. No new concerns at this time.      HISTORY OF PRESENT ILLNESS: Today 01/06/19 Terri Barajas is a 73 y.o. female here today for follow up for OSA on CPAP. She reports that she was refitted for a new mask. She continues to have trouble with the mask falling off. She is using a medium/wide full facemask. She feels that this mask is a little small. She is calling DME for a large mask and feels this will help. She has noted benefit with CPAP therapy. She is less tired.  Compliance report dated 12/06/2018 through 01/04/2019 reveals that she has used her machine 30 out of the last 30 days for compliance of 100%.  23 days she used her machine greater than 4 hours for compliance of 77%.  AHI was 0.8 on 5 to 12 cm of water and EPR of 3.  There was a significant leak noted in the 95th percentile at 30.4.  She returns today for evaluation.  HISTORY: (copied from my note on 10/06/2018)  Terri Barajas is a 73 y.o. female for follow up os OSA on CPAP.  Ms. Ihor Dow reports that she has had some difficulty with her mask.  She was using a full facemask but reports that she could not tolerate this.  She went back to her old nasal mask but is uncertain if it is fitting as it should.  She always uses CPAP when she goes to sleep but takes it off shortly after.  She is requesting a mask refitting.  09/04/2018 - 10/03/2018 Usage 09/04/2018 - 10/03/2018 Usage days 13/30 days (43%) >= 4 hours 5 days (17%) < 4 hours 8 days (27%) Usage hours 49 hours 26 minutes Average usage (total days) 1 hours 39 minutes Average usage (days used) 3 hours 48 minutes Median usage (days used) 3 hours 23 minutes Total used hours (value since last reset - 10/03/2018) 51 hours AirSense 10 AutoSet Serial number [id number] Mode AutoSet Min  Pressure 5 cmH2O Max Pressure 12 cmH2O EPR Fulltime EPR level 3 Response Standard Therapy Pressure - cmH2O Median: 8.1 95th percentile: 10.5 Maximum: 10.9 Leaks - L/min Median: 12.1 95th percentile: 36.7 Maximum: 62.7 Events per hour AI: 1.5 HI: 0.9 AHI: 2.4 Apnea Index Central: 0.7 Obstructive: 0.2 Unknown: 0.5 RERA Index 0.7 Cheyne-Stokes respiration (average duration per night) 0 minutes (0%)   History (copied from Dr Dohmeier's note on 06/30/2018)  Terri Barajas a 73 y.o.AAfemalepatient,recently moved from [place] andseen hereon 1-14-2020in a referral from Dr. Doreen Beam a sleep evaluation. The patient considers herself a mouth breather, her husband stated she snores. She has woken up with a very dry mouth and sore throat in November , while heating the house. She drank water through the night. The dry mouth disappeared in December.  Chief complaint according to patient :" I am very cold natured and have a dry mouth".  Sleep habits are as follows:dinner time for Mrs Mechille Varghese is around 5-6 PM. She is retired as is her husband. She Spends evenings watching TV, doing housekeeping. He would go to bed at 10 Pm . She sleeps easily and wakes up once for nocturia. She may fall asleep on the sofa watching TV. Sleeps on average 6-8 hours, wakes up spontaneously in AM when her husband leaves for  work. Feeling refreshed, no headaches, no nausea.   Sleep medical history: no tonsillectomy, snoring , but no apnea being witnessed.   Family sleep history:sister with OSA, one of 12 children in [place], [place]. Parents may have snored.   Social history:no night shift work history. She worked at [employer] for 48 years, mostly in Press photographer. Married with 2 children 50 and 43. She has an exercise room at the condo complex, she can't get access to the machines.  Non smoker, non drinker, caffeine : rare sodas, no iced tea, in winter some hot  chocolate. Hot black or green tea in winter decaffeinated.  REVIEW OF SYSTEMS: Out of a complete 14 system review of symptoms, the patient complains only of the following symptoms, blurred vision, food allergies, restless legs, muscle cramps, dizziness, headaches and all other reviewed systems are negative.  Epworth sleepiness scale: 7   ALLERGIES: Allergies  Allergen Reactions  . Atracurium & Derivatives Other (See Comments)    Unknown  . Atropine Other (See Comments)    Heart racing  . Iodine Other (See Comments)    Unknown  . Shellfish Allergy Other (See Comments)    Unknown  . Statins     "didn't feel right"    HOME MEDICATIONS: Outpatient Medications Prior to Visit  Medication Sig Dispense Refill  . ALPRAZolam (XANAX) 0.5 MG tablet Take 0.25 mg by mouth daily as needed for anxiety.     Marland Kitchen aspirin 81 MG chewable tablet Chew 81 mg by mouth daily.    Marland Kitchen BIOTIN PO Take 1 tablet by mouth daily.     . Cholecalciferol (VITAMIN D3) 25 MCG (1000 UT) CAPS Take 1,000 Units by mouth daily.    Marland Kitchen dexlansoprazole (DEXILANT) 60 MG capsule Take 1 capsule (60 mg total) by mouth daily. 30 capsule 11  . hydrochlorothiazide (HYDRODIURIL) 25 MG tablet Take 25 mg by mouth every other day.     . latanoprost (XALATAN) 0.005 % ophthalmic solution Place 1 drop into both eyes at bedtime.     . meloxicam (MOBIC) 15 MG tablet Take 1 tablet (15 mg total) by mouth daily. 30 tablet 0  . Multiple Vitamins-Minerals (CENTRUM SILVER 50+WOMEN PO) Take 1 tablet by mouth daily.    . Omega-3 Fatty Acids (FISH OIL PO) Take 1 capsule by mouth daily.    . polycarbophil (FIBERCON) 625 MG tablet Take 625 mg by mouth daily.    . polyethylene glycol (MIRALAX / GLYCOLAX) packet Take 17 g by mouth daily.    . simethicone (MYLICON) 798 MG chewable tablet Chew 125 mg by mouth 3 (three) times daily as needed for flatulence.     No facility-administered medications prior to visit.     PAST MEDICAL HISTORY: Past Medical  History:  Diagnosis Date  . Anxiety   . Arthritis   . Complication of anesthesia    some type of sedation medication made her feel crazy  . Dysrhythmia   . Family history of adverse reaction to anesthesia    daughter has Malignant Hyperthermia hx -   . GERD (gastroesophageal reflux disease)   . Glaucoma   . H/O: hysterectomy 1978  . Heart disease   . History of hiatal hernia   . Hypertension   . MVP (mitral valve prolapse)   . Panic attacks   . Pre-diabetes     PAST SURGICAL HISTORY: Past Surgical History:  Procedure Laterality Date  . APPENDECTOMY  1972  . BIOPSY  07/06/2018   Procedure: BIOPSY;  Surgeon: Irving Copas., MD;  Location: [facility];  Service: Gastroenterology;;  . CATARACT EXTRACTION  2013  . [place]  . COLONOSCOPY    . ESOPHAGOGASTRODUODENOSCOPY (EGD) WITH PROPOFOL N/A 07/06/2018   Procedure: ESOPHAGOGASTRODUODENOSCOPY (EGD) WITH PROPOFOL;  Surgeon: Rush Landmark Telford Nab., MD;  Location: [facility];  Service: Gastroenterology;  Laterality: N/A;  . HAND SURGERY Bilateral   . HEMORRHOID SURGERY  2018  . S/P Hysterectomy   1987    FAMILY HISTORY: Family History  Problem Relation Age of Onset  . Pancreatic cancer Mother   . Alcohol abuse Father   . Asthma Sister   . Hypertension Sister   . Arthritis Sister   . Diabetes Brother   . Arthritis Brother   . Hyperlipidemia Brother   . Hypertension Brother   . Glaucoma Brother   . Colon polyps Brother   . Arthritis Sister   . Arthritis Sister   . Prostate cancer Brother   . Congenital heart disease Brother   . Colon cancer Neg Hx   . Esophageal cancer Neg Hx   . Inflammatory bowel disease Neg Hx   . Liver disease Neg Hx   . Rectal cancer Neg Hx   . Stomach cancer Neg Hx     SOCIAL HISTORY: Social History   Socioeconomic History  . Marital status: Married    Spouse name: Not on file  . Number of children: Not on file  . Years of education: 73  . Highest  education level: Not on file  Occupational History  . Not on file  Social Needs  . Financial resource strain: Not on file  . Food insecurity    Worry: Not on file    Inability: Not on file  . Transportation needs    Medical: Not on file    Non-medical: Not on file  Tobacco Use  . Smoking status: Never Smoker  . Smokeless tobacco: Never Used  Substance and Sexual Activity  . Alcohol use: Not Currently  . Drug use: Not Currently  . Sexual activity: Yes    Partners: Male  Lifestyle  . Physical activity    Days per week: Not on file    Minutes per session: Not on file  . Stress: Not on file  Relationships  . Social Herbalist on phone: Not on file    Gets together: Not on file    Attends religious service: Not on file    Active member of club or organization: Not on file    Attends meetings of clubs or organizations: Not on file    Relationship status: Not on file  . Intimate partner violence    Fear of current or ex partner: Not on file    Emotionally abused: Not on file    Physically abused: Not on file    Forced sexual activity: Not on file  Other Topics Concern  . Not on file  Social History Narrative  . Not on file      PHYSICAL EXAM  Vitals:   01/06/19 0934  BP: 121/73  Pulse: 87  Temp: 97.7 F (36.5 C)  TempSrc: Oral  Weight: 153 lb 12.8 oz (69.8 kg)  Height: 5\' 3"  (1.6 m)   Body mass index is 27.24 kg/m.  Generalized: Well developed, in no acute distress  Cardiology: normal rate and rhythm, no murmur noted Respiratory: clear to auscultation bilaterally Mallampati: 3+, neck circumference: 14.25" Neurological examination  Mentation: Alert oriented to  time, place, history taking. Follows all commands speech and language fluent Cranial nerve II-XII: Pupils were equal round reactive to light. Extraocular movements were full, visual field were full on confrontational test. Facial sensation and strength were normal. Uvula tongue midline. Head  turning and shoulder shrug  were normal and symmetric. Motor: The motor testing reveals 5 over 5 strength of all 4 extremities. Good symmetric motor tone is noted throughout.  Coordination: Cerebellar testing reveals good finger-nose-finger and heel-to-shin bilaterally.  Gait and station: Gait is normal.   DIAGNOSTIC DATA (LABS, IMAGING, TESTING) - I reviewed patient records, labs, notes, testing and imaging myself where available.  No flowsheet data found.   Lab Results  Component Value Date   WBC 6.3 07/10/2018   HGB 12.9 07/10/2018   HCT 39.9 07/10/2018   MCV 90.5 07/10/2018   PLT 234 07/10/2018      Component Value Date/Time   NA 139 07/10/2018 1910   NA 138 12/03/2016   K 3.6 07/10/2018 1910   CL 103 07/10/2018 1910   CO2 26 07/10/2018 1910   GLUCOSE 88 07/10/2018 1910   BUN 14 07/10/2018 1910   BUN 14 12/03/2016   CREATININE 0.73 07/10/2018 1910   CALCIUM 10.0 07/10/2018 1910   PROT 7.3 07/10/2018 1910   ALBUMIN 4.0 07/10/2018 1910   AST 21 07/10/2018 1910   ALT 20 07/10/2018 1910   ALKPHOS 61 07/10/2018 1910   BILITOT 0.6 07/10/2018 1910   GFRNONAA >60 07/10/2018 1910   GFRAA >60 07/10/2018 1910   Lab Results  Component Value Date   CHOL 270 (H) 05/27/2018   HDL 54.70 05/27/2018   LDLCALC 177 (H) 05/27/2018   TRIG 191.0 (H) 05/27/2018   CHOLHDL 5 05/27/2018   Lab Results  Component Value Date   HGBA1C 6.0 05/27/2018   No results found for: VITAMINB12 Lab Results  Component Value Date   TSH 1.21 05/27/2018     ASSESSMENT AND PLAN 73 y.o. year old female  has a past medical history of Anxiety, Arthritis, Complication of anesthesia, Dysrhythmia, Family history of adverse reaction to anesthesia, GERD (gastroesophageal reflux disease), Glaucoma, H/O: hysterectomy (1978), Heart disease, History of hiatal hernia, Hypertension, MVP (mitral valve prolapse), Panic attacks, and Pre-diabetes. here with     ICD-10-CM   1. OSA on CPAP  G47.33    Z99.89     82  is doing much better with compliance.  Compliance report shows that she is using her machine every night.  She was encouraged to continue using CPAP nightly and greater than 4 hours each night.  We have discussed risk of untreated sleep apnea.  She was also advised to reach out should she have difficulty obtaining a new mask.  She was advised to watch CPAP for grain might nightly.  She with continued leak concerns.  We will follow-up in 6 months to repeat compliance report.  She verbalizes understanding and agreement with this plan.   No orders of the defined types were placed in this encounter.    No orders of the defined types were placed in this encounter.     I spent 15 minutes with the patient. 50% of this time was spent counseling and educating patient on plan of care and medications.    Debbora Presto, FNP-C 01/06/2019, 10:53 AM [facility] [address], [place], [place] [address] [phone]

## 2019-01-07 ENCOUNTER — Encounter: Payer: Self-pay | Admitting: Family Medicine

## 2019-01-07 ENCOUNTER — Ambulatory Visit (INDEPENDENT_AMBULATORY_CARE_PROVIDER_SITE_OTHER): Payer: Medicare Other | Admitting: Family Medicine

## 2019-01-07 ENCOUNTER — Other Ambulatory Visit: Payer: Self-pay

## 2019-01-07 DIAGNOSIS — L989 Disorder of the skin and subcutaneous tissue, unspecified: Secondary | ICD-10-CM

## 2019-01-07 DIAGNOSIS — W57XXXA Bitten or stung by nonvenomous insect and other nonvenomous arthropods, initial encounter: Secondary | ICD-10-CM

## 2019-01-07 DIAGNOSIS — S80869A Insect bite (nonvenomous), unspecified lower leg, initial encounter: Secondary | ICD-10-CM | POA: Diagnosis not present

## 2019-01-07 NOTE — Progress Notes (Signed)
Virtual Visit via Video Note  I connected with Terri Barajas  on 01/07/19 at 10:20 AM EDT by a video enabled telemedicine application and verified that I am speaking with the correct person using two identifiers.  Location patient: home Location provider:work or home office Persons participating in the virtual visit: patient, provider  I discussed the limitations of evaluation and management by telemedicine and the availability of in person appointments. The patient expressed understanding and agreed to proceed.   HPI:  Acute visit for a tick bite: -got bit by a tick 2 days ago -found it on her leg, she feels sure it was biting less then 24 hours as she took a shower and it was not there, then found it later, was very small - she removed it without difficulty -small itchy bump on her leg - now spreading redness or pus -no fevers, malaise, flu like symptoms, rash or any other symptoms -has only been in [place] the last month - no travel to other areas   ROS: See pertinent positives and negatives per HPI.  Past Medical History:  Diagnosis Date  . Anxiety   . Arthritis   . Complication of anesthesia    some type of sedation medication made her feel crazy  . Dysrhythmia   . Family history of adverse reaction to anesthesia    daughter has Malignant Hyperthermia hx -   . GERD (gastroesophageal reflux disease)   . Glaucoma   . H/O: hysterectomy 1978  . Heart disease   . History of hiatal hernia   . Hypertension   . MVP (mitral valve prolapse)   . Panic attacks   . Pre-diabetes     Past Surgical History:  Procedure Laterality Date  . APPENDECTOMY  1972  . BIOPSY  07/06/2018   Procedure: BIOPSY;  Surgeon: Rush Landmark Telford Nab., MD;  Location: [facility];  Service: Gastroenterology;;  . CATARACT EXTRACTION  2013  . [place]  . COLONOSCOPY    . ESOPHAGOGASTRODUODENOSCOPY (EGD) WITH PROPOFOL N/A 07/06/2018   Procedure: ESOPHAGOGASTRODUODENOSCOPY (EGD) WITH  PROPOFOL;  Surgeon: Rush Landmark Telford Nab., MD;  Location: [facility];  Service: Gastroenterology;  Laterality: N/A;  . HAND SURGERY Bilateral   . HEMORRHOID SURGERY  2018  . S/P Hysterectomy   1987    Family History  Problem Relation Age of Onset  . Pancreatic cancer Mother   . Alcohol abuse Father   . Asthma Sister   . Hypertension Sister   . Arthritis Sister   . Diabetes Brother   . Arthritis Brother   . Hyperlipidemia Brother   . Hypertension Brother   . Glaucoma Brother   . Colon polyps Brother   . Arthritis Sister   . Arthritis Sister   . Prostate cancer Brother   . Congenital heart disease Brother   . Colon cancer Neg Hx   . Esophageal cancer Neg Hx   . Inflammatory bowel disease Neg Hx   . Liver disease Neg Hx   . Rectal cancer Neg Hx   . Stomach cancer Neg Hx     SOCIAL HX: see hpi   Current Outpatient Medications:  .  ALPRAZolam (XANAX) 0.5 MG tablet, Take 0.25 mg by mouth daily as needed for anxiety. , Disp: , Rfl:  .  aspirin 81 MG chewable tablet, Chew 81 mg by mouth daily., Disp: , Rfl:  .  BIOTIN PO, Take 1 tablet by mouth daily. , Disp: , Rfl:  .  Cholecalciferol (VITAMIN D3) 25  MCG (1000 UT) CAPS, Take 1,000 Units by mouth daily., Disp: , Rfl:  .  dexlansoprazole (DEXILANT) 60 MG capsule, Take 1 capsule (60 mg total) by mouth daily., Disp: 30 capsule, Rfl: 11 .  hydrochlorothiazide (HYDRODIURIL) 25 MG tablet, Take 25 mg by mouth every other day. , Disp: , Rfl:  .  latanoprost (XALATAN) 0.005 % ophthalmic solution, Place 1 drop into both eyes at bedtime. , Disp: , Rfl:  .  meloxicam (MOBIC) 15 MG tablet, Take 1 tablet (15 mg total) by mouth daily. (Patient taking differently: Take 15 mg by mouth as needed. ), Disp: 30 tablet, Rfl: 0 .  Multiple Vitamins-Minerals (CENTRUM SILVER 50+WOMEN PO), Take 1 tablet by mouth daily., Disp: , Rfl:  .  Omega-3 Fatty Acids (FISH OIL PO), Take 1 capsule by mouth daily., Disp: , Rfl:  .  polycarbophil (FIBERCON) 625 MG  tablet, Take 625 mg by mouth daily., Disp: , Rfl:  .  polyethylene glycol (MIRALAX / GLYCOLAX) packet, Take 17 g by mouth daily., Disp: , Rfl:   EXAM:  VITALS per patient if applicable:  GENERAL: alert, oriented, appears well and in no acute distress  HEENT: atraumatic, conjunttiva clear, no obvious abnormalities on inspection of external nose and ears  NECK: normal movements of the head and neck  LUNGS: on inspection no signs of respiratory distress, breathing rate appears normal, no obvious gross SOB, gasping or wheezing  CV: no obvious cyanosis  MS: moves all visible extremities without noticeable abnormality  PSYCH/NEURO: pleasant and cooperative, no obvious depression or anxiety, speech and thought processing grossly intact  ASSESSMENT AND PLAN:  Discussed the following assessment and plan:  Skin lesion   Tick bite, initial encounter   Discussed tick bite, signs and symptoms of various tick borne illnesses including muct not limited to RMSF, Lyme, meat allergy, etc, treatment, prophylaxis guidelines and risks. For now opted to monitor, benadryl or hct cream locally and antihistamine if needed for itch. She agrees to follow up if any worsening, other symptoms develop, or any other concerns.   I discussed the assessment and treatment plan with the patient. The patient was provided an opportunity to ask questions and all were answered. The patient agreed with the plan and demonstrated an understanding of the instructions.   The patient was advised to call back or seek an in-person evaluation if the symptoms worsen or if the condition fails to improve as anticipated.   Lucretia Kern, DO

## 2019-01-07 NOTE — Patient Instructions (Signed)

## 2019-01-13 ENCOUNTER — Ambulatory Visit (INDEPENDENT_AMBULATORY_CARE_PROVIDER_SITE_OTHER): Payer: Medicare Other | Admitting: Physician Assistant

## 2019-01-13 ENCOUNTER — Encounter: Payer: Self-pay | Admitting: Physician Assistant

## 2019-01-13 DIAGNOSIS — R110 Nausea: Secondary | ICD-10-CM

## 2019-01-13 DIAGNOSIS — R51 Headache: Secondary | ICD-10-CM | POA: Diagnosis not present

## 2019-01-13 DIAGNOSIS — S70362A Insect bite (nonvenomous), left thigh, initial encounter: Secondary | ICD-10-CM

## 2019-01-13 DIAGNOSIS — R6889 Other general symptoms and signs: Secondary | ICD-10-CM

## 2019-01-13 DIAGNOSIS — M791 Myalgia, unspecified site: Secondary | ICD-10-CM | POA: Diagnosis not present

## 2019-01-13 MED ORDER — DOXYCYCLINE HYCLATE 100 MG PO TABS: 100.0000 mg | ORAL_TABLET | Freq: Two times a day (BID) | ORAL | 0 refills | Status: DC

## 2019-01-13 NOTE — Progress Notes (Signed)
Virtual Visit via Video   I connected with Terri Barajas on 01/13/19 at 10:40 AM EDT by a video enabled telemedicine application and verified that I am speaking with the correct person using two identifiers. Location patient: Home Location provider: [facility], Office Persons participating in the virtual visit: Terri, Buerkle PA-C.  I discussed the limitations of evaluation and management by telemedicine and the availability of in person appointments. The patient expressed understanding and agreed to proceed.  I acted as a Education administrator for [employer], PA-C [employer], LPN  Subjective:   HPI:  Tick bite/Flu-like symptoms Pt c/o tick bite on 7/22 on Lt thigh, she removed tick and cleaned area. Lt thigh where bit was is slightly red and itching. No edema around the bite. Has been using Benadryl cream for itching with some relief. Pt has been having headaches and nausea off and on, the past week started having a stiff neck, shoulder pain and chills. Temperature has been normal per pt. Denies any exposure to COVID-19. Eating and drinking well.  ROS: See pertinent positives and negatives per HPI.  Patient Active Problem List   Diagnosis Date Noted  . OSA on CPAP 01/06/2019  . Abnormal CT of the abdomen 10/04/2018  . Radiculopathy affecting upper extremity, left 07/25/2018  . Hyperlipidemia 05/13/2018  . Situational anxiety 04/29/2018  . MVP (mitral valve prolapse) 04/29/2018  . Sleep-disordered breathing 04/29/2018  . Family History of Hyperthermia, malignant 04/23/2018  . Barrett's esophagus 03/01/2018  . Elevated lipase 02/18/2018  . Hiatal hernia 02/18/2018  . Constipation 02/18/2018  . Bloating 02/18/2018  . Aortic atherosclerosis ([place]) 01/23/2018  . GERD (gastroesophageal reflux disease)   . Glaucoma   . Hypertension     Social History   Tobacco Use  . Smoking status: Never Smoker  . Smokeless tobacco: Never Used  Substance Use Topics   . Alcohol use: Not Currently    Current Outpatient Medications:  .  ALPRAZolam (XANAX) 0.5 MG tablet, Take 0.25 mg by mouth daily as needed for anxiety. , Disp: , Rfl:  .  aspirin 81 MG chewable tablet, Chew 81 mg by mouth daily., Disp: , Rfl:  .  BIOTIN PO, Take 1 tablet by mouth daily. , Disp: , Rfl:  .  Cholecalciferol (VITAMIN D3) 25 MCG (1000 UT) CAPS, Take 1,000 Units by mouth daily., Disp: , Rfl:  .  dexlansoprazole (DEXILANT) 60 MG capsule, Take 1 capsule (60 mg total) by mouth daily., Disp: 30 capsule, Rfl: 11 .  hydrochlorothiazide (HYDRODIURIL) 25 MG tablet, Take 25 mg by mouth every other day. , Disp: , Rfl:  .  latanoprost (XALATAN) 0.005 % ophthalmic solution, Place 1 drop into both eyes at bedtime. , Disp: , Rfl:  .  meloxicam (MOBIC) 15 MG tablet, Take 1 tablet (15 mg total) by mouth daily. (Patient taking differently: Take 15 mg by mouth as needed. ), Disp: 30 tablet, Rfl: 0 .  Multiple Vitamins-Minerals (CENTRUM SILVER 50+WOMEN PO), Take 1 tablet by mouth daily., Disp: , Rfl:  .  Omega-3 Fatty Acids (FISH OIL PO), Take 1 capsule by mouth daily., Disp: , Rfl:  .  polycarbophil (FIBERCON) 625 MG tablet, Take 625 mg by mouth daily., Disp: , Rfl:  .  polyethylene glycol (MIRALAX / GLYCOLAX) packet, Take 17 g by mouth daily., Disp: , Rfl:  .  doxycycline (VIBRA-TABS) 100 MG tablet, Take 1 tablet (100 mg total) by mouth 2 (two) times daily., Disp: 20 tablet, Rfl: 0  Allergies  Allergen Reactions  . Atracurium & Derivatives Other (See Comments)    Unknown  . Atropine Other (See Comments)    Heart racing  . Iodine Other (See Comments)    Unknown  . Shellfish Allergy Other (See Comments)    Unknown  . Statins     "didn't feel right"    Objective:   VITALS: Per patient if applicable, see vitals. GENERAL: Alert, appears well and in no acute distress. HEENT: Atraumatic, conjunctiva clear, no obvious abnormalities on inspection of external nose and ears. NECK: Normal  movements of the head and neck. CARDIOPULMONARY: No increased WOB. Speaking in clear sentences. I:E ratio WNL.  MS: Moves all visible extremities without noticeable abnormality. PSYCH: Pleasant and cooperative, well-groomed. Speech normal rate and rhythm. Affect is appropriate. Insight and judgement are appropriate. Attention is focused, linear, and appropriate.  NEURO: CN grossly intact. Oriented as arrived to appointment on time with no prompting. Moves both UE equally.  SKIN: No obvious lesions, wounds, erythema, or cyanosis noted on face or hands.  Assessment and Plan:   Lexani was seen today for tick removal.  Diagnoses and all orders for this visit:  Flu-like symptoms -     Novel Coronavirus, NAA ([facility])  Other orders -     doxycycline (VIBRA-TABS) 100 MG tablet; Take 1 tablet (100 mg total) by mouth 2 (two) times daily.   Suspect possible viral illness, but cannot rule out COVID-19 or tick-borne illness at this time. We are going to treat with doxycycline per orders and test for COVID-19. We are going to send patient for drive-up testing. As a precaution, they have been advised to remain home until COVID-19 results and then possible further quarantine after that based on results and symptoms. Advised if they experience a "second sickening" or worsening symptoms as the illness progresses, they are to call the office for further instructions or seek emergent evaluation for any severe symptoms.   . Reviewed expectations re: course of current medical issues. . Discussed self-management of symptoms. . Outlined signs and symptoms indicating need for more acute intervention. . Patient verbalized understanding and all questions were answered. Marland Kitchen Health Maintenance issues including appropriate healthy diet, exercise, and smoking avoidance were discussed with patient. . See orders for this visit as documented in the electronic medical record.  I discussed the assessment and treatment plan  with the patient. The patient was provided an opportunity to ask questions and all were answered. The patient agreed with the plan and demonstrated an understanding of the instructions.   The patient was advised to call back or seek an in-person evaluation if the symptoms worsen or if the condition fails to improve as anticipated.   CMA or LPN served as scribe during this visit. History, Physical, and Plan performed by medical provider. The above documentation has been reviewed and is accurate and complete.   [place], [place] 01/13/2019

## 2019-01-14 ENCOUNTER — Other Ambulatory Visit: Payer: Self-pay

## 2019-01-14 DIAGNOSIS — Z20822 Contact with and (suspected) exposure to covid-19: Secondary | ICD-10-CM

## 2019-01-14 DIAGNOSIS — R6889 Other general symptoms and signs: Secondary | ICD-10-CM | POA: Diagnosis not present

## 2019-01-16 LAB — NOVEL CORONAVIRUS, NAA: SARS-CoV-2, NAA: NOT DETECTED

## 2019-01-21 DIAGNOSIS — G4733 Obstructive sleep apnea (adult) (pediatric): Secondary | ICD-10-CM | POA: Diagnosis not present

## 2019-01-29 DIAGNOSIS — H401132 Primary open-angle glaucoma, bilateral, moderate stage: Secondary | ICD-10-CM | POA: Diagnosis not present

## 2019-02-21 DIAGNOSIS — G4733 Obstructive sleep apnea (adult) (pediatric): Secondary | ICD-10-CM | POA: Diagnosis not present

## 2019-02-26 DIAGNOSIS — G4733 Obstructive sleep apnea (adult) (pediatric): Secondary | ICD-10-CM | POA: Diagnosis not present

## 2019-03-03 ENCOUNTER — Ambulatory Visit: Payer: Medicare Other

## 2019-03-09 ENCOUNTER — Telehealth: Payer: Self-pay | Admitting: Family Medicine

## 2019-03-09 NOTE — Telephone Encounter (Signed)
I spoke with the patient's husband about scheduling AWVs for them with [facility].  He said that [facility] came to their home to complete wellness visit.

## 2019-03-10 ENCOUNTER — Ambulatory Visit (INDEPENDENT_AMBULATORY_CARE_PROVIDER_SITE_OTHER): Payer: Medicare Other | Admitting: Podiatry

## 2019-03-10 ENCOUNTER — Other Ambulatory Visit: Payer: Self-pay

## 2019-03-10 DIAGNOSIS — M722 Plantar fascial fibromatosis: Secondary | ICD-10-CM | POA: Diagnosis not present

## 2019-03-11 ENCOUNTER — Encounter: Payer: Self-pay | Admitting: Family Medicine

## 2019-03-11 ENCOUNTER — Ambulatory Visit (INDEPENDENT_AMBULATORY_CARE_PROVIDER_SITE_OTHER): Payer: Medicare Other

## 2019-03-11 ENCOUNTER — Telehealth: Payer: Self-pay | Admitting: Physical Therapy

## 2019-03-11 DIAGNOSIS — Z23 Encounter for immunization: Secondary | ICD-10-CM

## 2019-03-11 NOTE — Progress Notes (Signed)
Per orders of Dr. Juleen China, injection of Pneumococcal 13 injection given by Lonell Grandchild in right deltoid. Patient tolerated injection well.

## 2019-03-11 NOTE — Telephone Encounter (Signed)
Copied from [place] [phone]. Topic: General - Other >> Mar 11, 2019 12:51 PM Mcneil, Ja-Kwan wrote: Reason for CRM: Pt stated that she requested the shingles vaccine today but she was told that it could not be done. Pt stated she was not told why she could not get the  vaccine. Pt requests call back.

## 2019-03-11 NOTE — Patient Instructions (Signed)
Health Maintenance Due  Topic Date Due  . TETANUS/TDAP  10/21/1966  . MAMMOGRAM  10/20/1997  . DEXA SCAN  10/20/2012  . PNA vac Low Risk Adult (2 of 2 - PCV13) 06/09/2018  . INFLUENZA VACCINE  01/16/2019    Depression screen [facility] 2/9 11/23/2018 07/21/2018 07/16/2018  Decreased Interest 0 2 0  Down, Depressed, Hopeless 0 2 0  PHQ - 2 Score 0 4 0  Altered sleeping 0 0 -  Tired, decreased energy 0 0 -  Change in appetite 0 1 -  Feeling bad or failure about yourself  0 1 -  Trouble concentrating 0 0 -  Moving slowly or fidgety/restless 0 0 -  Suicidal thoughts 0 0 -  PHQ-9 Score 0 6 -  Difficult doing work/chores Not difficult at all Somewhat difficult -

## 2019-03-11 NOTE — Telephone Encounter (Signed)
Left message to return call to our office.  

## 2019-03-11 NOTE — Addendum Note (Signed)
Addended by: Darral Dash on: 03/11/2019 10:42 AM   Modules accepted: Orders

## 2019-03-14 NOTE — Progress Notes (Signed)
   Subjective: 73 y.o. female presenting today for follow up evaluation of plantar fasciitis of the right foot. She reports some continued pain. The injection only provided relief for about two weeks. When she uses the fascial brace her foot tends to swell. She has been taking Meloxicam which helps alleviate her pain but she now needs a refill. Walking and being on her foot for too long also makes the symptoms worse. Patient is here for further evaluation and treatment.   Past Medical History:  Diagnosis Date  . Anxiety   . Arthritis   . Complication of anesthesia    some type of sedation medication made her feel crazy  . Dysrhythmia   . Family history of adverse reaction to anesthesia    daughter has Malignant Hyperthermia hx -   . GERD (gastroesophageal reflux disease)   . Glaucoma   . H/O: hysterectomy 1978  . Heart disease   . History of hiatal hernia   . Hypertension   . MVP (mitral valve prolapse)   . Panic attacks   . Pre-diabetes      Objective: Physical Exam General: The patient is alert and oriented x3 in no acute distress.  Dermatology: Skin is warm, dry and supple bilateral lower extremities. Negative for open lesions or macerations bilateral.   Vascular: Dorsalis Pedis and Posterior Tibial pulses palpable bilateral.  Capillary fill time is immediate to all digits.  Neurological: Epicritic and protective threshold intact bilateral.   Musculoskeletal: Tenderness to palpation to the plantar aspect of the right heel along the plantar fascia. All other joints range of motion within normal limits bilateral. Strength 5/5 in all groups bilateral.    Assessment: 1. Plantar fasciitis right  Plan of Care:  1. Patient evaluated. 2. Injection of 0.5cc Celestone soluspan injected into the right plantar fascia  3. Compression anklet dispensed.  4. Rx for Meloxicam ordered for patient. 5. Return to clinic in 4 weeks. If not better, we will discuss surgery.   Recently  moved from [place].    Edrick Kins, DPM [facility]  Dr. Edrick Kins, DPM    [address], [place] [address]                Office [phone]  Fax [phone]

## 2019-03-16 NOTE — Telephone Encounter (Signed)
Called patient answered all questions. Let her know she could not get that day because she received flu and pneumonia. Also let her know that she needed to call insurance to see if it is covered in office or if she has to have a pharmacy.

## 2019-03-17 ENCOUNTER — Other Ambulatory Visit: Payer: Self-pay

## 2019-03-23 DIAGNOSIS — G4733 Obstructive sleep apnea (adult) (pediatric): Secondary | ICD-10-CM | POA: Diagnosis not present

## 2019-03-29 ENCOUNTER — Other Ambulatory Visit: Payer: Self-pay

## 2019-03-29 NOTE — Telephone Encounter (Signed)
Medications never given by our office.  PMR did not show anything for patient.  Last seen 11/23/2018 No follow up

## 2019-04-01 MED ORDER — ALPRAZOLAM 0.5 MG PO TABS: 0.2500 mg | ORAL_TABLET | Freq: Every day | ORAL | 0 refills | Status: DC | PRN

## 2019-04-01 MED ORDER — HYDROCHLOROTHIAZIDE 25 MG PO TABS: 25.0000 mg | ORAL_TABLET | ORAL | 3 refills | Status: DC

## 2019-04-15 ENCOUNTER — Encounter: Payer: Self-pay | Admitting: Cardiovascular Disease

## 2019-04-15 ENCOUNTER — Other Ambulatory Visit: Payer: Self-pay

## 2019-04-15 ENCOUNTER — Ambulatory Visit: Payer: Medicare Other | Admitting: Cardiovascular Disease

## 2019-04-15 VITALS — BP 150/87 | HR 79 | Ht 63.0 in | Wt 158.0 lb

## 2019-04-15 DIAGNOSIS — I341 Nonrheumatic mitral (valve) prolapse: Secondary | ICD-10-CM

## 2019-04-15 DIAGNOSIS — I10 Essential (primary) hypertension: Secondary | ICD-10-CM | POA: Diagnosis not present

## 2019-04-15 MED ORDER — AMLODIPINE BESYLATE 5 MG PO TABS: 5.0000 mg | ORAL_TABLET | Freq: Every day | ORAL | 3 refills | Status: DC

## 2019-04-15 NOTE — Progress Notes (Signed)
Cardiology Office Note   Date:  04/15/2019   ID:  Terri Barajas, Terri Barajas 02/12/1946, MRN [id number]  PCP:  Briscoe Deutscher, DO  Cardiologist:   Skeet Latch, MD   No chief complaint on file.    History of Present Illness: Terri Barajas is a 73 y.o. female with hypertension, hyperlipidemia, Barrett's esophagus here for follow up.  She was initially seen 06/2018 for the evaluation ofabnormal EKG.Terri Barajas a long history of mitral valve prolapse and was seeing a cardiologist in [place] before moving to [place]. She has had this diagnosis for at least 10 years. She also has been treated with nadolol for palpitations. She does not recall being told she had any specific arrhythmia diagnosis. The nadolol has controlled it well. According to her outside records she had an echo 09/2017 that revealed LVEF 60 to 65% with trivial mitral regurgitation and mild tricuspid regurgitation.No mitral valve prolapse was noted on this echo. She reports having a stress test in 2019 that was performed to follow-up her mitral valve disease but she does not recall having any symptoms of chest pain or shortness of breath at the time. She had an EKG in January 2020 that revealed anterior T wave inversions.At the time she was being seen in the ED for epigastric and arm pain. She has a longstanding history of intermittent abdominal distention and bloating. She is has undergone upper endoscopies and biopsies that revealed Barrett's esophagus gastritis. On that particular day her epigastric discomfort was associated with left arm pain. Cardiac enzymes were negative. She was instructed to follow-up with cardiology as an outpatient. Terri Barajas was referred for a cardiac CT-A but it could not be performed due to tachycardia.  Instead she had a [employer] 08/2018 that revealed LVEF 64% and no ischemia.  Since her last appointment Terri Barajas has been doing well.  She does not  check her blood pressure much at home.  She notes that she takes hydrochlorothiazide every other day because she does not like how it makes her go to the bathroom.  She was switched from nadolol to metoprolol.  This change was made at the request of her insurance company.  She tried not taking it for couple days but then felt poorly so she ended up taking it.  Otherwise she has felt well.  She has not experienced any chest pain or shortness of breath.  She does have more salt in her diet than she would like to.  She has been trying to exercise by walking and riding a bike.  She has no exertional symptoms.   Past Medical History:  Diagnosis Date  . Anxiety   . Arthritis   . Complication of anesthesia    some type of sedation medication made her feel crazy  . Dysrhythmia   . Family history of adverse reaction to anesthesia    daughter has Malignant Hyperthermia hx -   . GERD (gastroesophageal reflux disease)   . Glaucoma   . H/O: hysterectomy 1978  . Heart disease   . History of hiatal hernia   . Hypertension   . MVP (mitral valve prolapse)   . Panic attacks   . Pre-diabetes     Past Surgical History:  Procedure Laterality Date  . APPENDECTOMY  1972  . BIOPSY  07/06/2018   Procedure: BIOPSY;  Surgeon: Rush Landmark Telford Nab., MD;  Location: [facility];  Service: Gastroenterology;;  . CATARACT EXTRACTION  2013  . [place] and  1978  . COLONOSCOPY    . ESOPHAGOGASTRODUODENOSCOPY (EGD) WITH PROPOFOL N/A 07/06/2018   Procedure: ESOPHAGOGASTRODUODENOSCOPY (EGD) WITH PROPOFOL;  Surgeon: Rush Landmark Telford Nab., MD;  Location: [facility];  Service: Gastroenterology;  Laterality: N/A;  . HAND SURGERY Bilateral   . HEMORRHOID SURGERY  2018  . S/P Hysterectomy   1987     Current Outpatient Medications  Medication Sig Dispense Refill  . ALPRAZolam (XANAX) 0.5 MG tablet Take 0.5 tablets (0.25 mg total) by mouth daily as needed for anxiety. 45 tablet 0  . aspirin 81 MG  chewable tablet Chew 81 mg by mouth daily.    Marland Kitchen BIOTIN PO Take 1 tablet by mouth daily.     . Cholecalciferol (VITAMIN D3) 25 MCG (1000 UT) CAPS Take 1,000 Units by mouth daily.    Marland Kitchen dexlansoprazole (DEXILANT) 60 MG capsule Take 1 capsule (60 mg total) by mouth daily. 30 capsule 11  . latanoprost (XALATAN) 0.005 % ophthalmic solution Place 1 drop into both eyes at bedtime.     . meloxicam (MOBIC) 15 MG tablet Take 1 tablet (15 mg total) by mouth daily. (Patient taking differently: Take 15 mg by mouth as needed. ) 30 tablet 0  . metoprolol tartrate (LOPRESSOR) 25 MG tablet Take 25 mg by mouth 2 (two) times daily.    . Multiple Vitamins-Minerals (CENTRUM SILVER 50+WOMEN PO) Take 1 tablet by mouth daily.    . Omega-3 Fatty Acids (FISH OIL PO) Take 1 capsule by mouth daily.    . polycarbophil (FIBERCON) 625 MG tablet Take 625 mg by mouth daily.    . polyethylene glycol (MIRALAX / GLYCOLAX) packet Take 17 g by mouth daily.    Marland Kitchen amLODipine (NORVASC) 5 MG tablet Take 1 tablet (5 mg total) by mouth daily. 90 tablet 3   No current facility-administered medications for this visit.     Allergies:   Atracurium & derivatives, Atropine, Iodine, Shellfish allergy, and Statins    Social History:  The patient  reports that she has never smoked. She has never used smokeless tobacco. She reports previous alcohol use. She reports previous drug use.   Family History:  [name] family history includes Alcohol abuse in her father; Arthritis in her brother, sister, sister, and sister; Asthma in her sister; Colon polyps in her brother; Congenital heart disease in her brother; Diabetes in her brother; Glaucoma in her brother; Hyperlipidemia in her brother; Hypertension in her brother and sister; Pancreatic cancer in her mother; Prostate cancer in her brother.    ROS:  Please see the history of present illness.   Otherwise, review of systems are positive for none.   All other systems are reviewed and negative.     PHYSICAL EXAM: VS:  BP (!) 150/87   Pulse 79   Ht 5\' 3"  (1.6 m)   Wt 158 lb (71.7 kg)   LMP  (LMP Unknown)   SpO2 97%   BMI 27.99 kg/m  , BMI Body mass index is 27.99 kg/m. GENERAL:  Well appearing HEENT: Pupils equal round and reactive, fundi not visualized, oral mucosa unremarkable NECK:  No jugular venous distention, waveform within normal limits, carotid upstroke brisk and symmetric, no bruits LUNGS:  Clear to auscultation bilaterally HEART:  RRR.  PMI not displaced or sustained,S1 and S2 within normal limits, no S3, no S4, no clicks, no rubs, no murmurs ABD:  Flat, positive bowel sounds normal in frequency in pitch, no bruits, no rebound, no guarding, no midline pulsatile mass, no hepatomegaly, no  splenomegaly EXT:  2 plus pulses throughout, no edema, no cyanosis no clubbing SKIN:  No rashes no nodules NEURO:  Cranial nerves II through XII grossly intact, motor grossly intact throughout PSYCH:  Cognitively intact, oriented to person place and time   EKG:  EKG is ordered today. The ekg ordered 07/13/18 demonstrates sinus rhythm.  Rate 70 bpm.  Non-specific ST changes.  04/15/19: Sinus rate 79 bpm.  Nonspecific ST changes.    Recent Labs: 05/27/2018: TSH 1.21 07/10/2018: ALT 20; BUN 14; Creatinine, Ser 0.73; Hemoglobin 12.9; Platelets 234; Potassium 3.6; Sodium 139    Lipid Panel    Component Value Date/Time   CHOL 270 (H) 05/27/2018 1045   TRIG 191.0 (H) 05/27/2018 1045   HDL 54.70 05/27/2018 1045   CHOLHDL 5 05/27/2018 1045   VLDL 38.2 05/27/2018 1045   LDLCALC 177 (H) 05/27/2018 1045      Wt Readings from Last 3 Encounters:  04/15/19 158 lb (71.7 kg)  01/06/19 153 lb 12.8 oz (69.8 kg)  11/23/18 153 lb (69.4 kg)      ASSESSMENT AND PLAN:  # Atypical chest pain: # Hyperlipidemia: Symptoms were very atypical for ischemia and more consistent with GERD.  She has no exertional symptoms and reportedly had a normal stress test.  She was referred for a coronary  CT-A but never had it.  Currently asymptomatic.  Continue aspirin and metoprolol.  # Hypertension: Blood pressure is above goal.  However after discussion with her she was only taking her diuretic every other day because she did not like it making her go to the bathroom frequently.  We decided to switch hydrochlorothiazide to amlodipine 5 mg daily.  Continue metoprolol.  She will track her blood pressures and bring it to follow-up.  She is also can work on limiting her salt intake and increasing her exercise to 150 minutes weekly.  # Mitral valve prolapse: There is no mention of mitral valve prolapse on her last 2 echocardiograms.  She had only trivial mitral irritation.  There is no evidence of heart failure on exam.  No need to repeat echocardiogram at this time.  Nadolol was switched to metoprolol.    Current medicines are reviewed at length with the patient today.  The patient does not have concerns regarding medicines.  The following changes have been made:  no change  Labs/ tests ordered today include:   Orders Placed This Encounter  Procedures  . EKG 12-Lead     Disposition:   FU with Reuben Knoblock C. Oval Linsey, MD, [facility] in 4 months.      Signed, Cleopha Indelicato C. Oval Linsey, MD, [facility]  04/15/2019 1:55 PM    [place]

## 2019-04-15 NOTE — Patient Instructions (Addendum)
Medication Instructions:  STOP HYDROCHLOROTHIAZIDE   START AMLODIPINE 5 MG DAILY   *If you need a refill on your cardiac medications before your next appointment, please call your pharmacy*  Lab Work: NONE   Testing/Procedures: NONE   Follow-Up: At [facility], you and your health needs are our priority.  As part of our continuing mission to provide you with exceptional heart care, we have created designated Provider Care Teams.  These Care Teams include your primary Cardiologist (physician) and Advanced Practice Providers (APPs -  Physician Assistants and Nurse Practitioners) who all work together to provide you with the care you need, when you need it.  Your next appointment:   Your physician recommends that you schedule a follow-up appointment in: 6 WEEKS WITH PA/NP/PHARM D FOR BLOOD PRESSURE-IN PERSON   4 months WITH DR [facility]   The format for your next appointment:   Either In Person or Virtual  MONITOR AND LOG YOUR BLOOD PRESSURE, BRING READINGS AND MACHINE TO FOLLOW UP

## 2019-04-23 ENCOUNTER — Ambulatory Visit: Payer: Self-pay | Admitting: *Deleted

## 2019-04-23 DIAGNOSIS — G4733 Obstructive sleep apnea (adult) (pediatric): Secondary | ICD-10-CM | POA: Diagnosis not present

## 2019-04-23 NOTE — Telephone Encounter (Signed)
Can't work in today: if severe headache, recommend urgent care visit

## 2019-04-23 NOTE — Telephone Encounter (Addendum)
Contacted pt due to concerns of the back of her neck pain in the center to her head rated 7-8 out of 10, and constant, chills, and fatigue x 1 week; she thought it was her sinuses, so she has been taking claritin; she denies fever, and says her last temp was 98.7; her last BP 04/22/2019 139/75 P 86; she denies chest pain or difficulty breathing; she is most concerned about her headache; she takes 1 extra strength twice daily with her last dose 04/22/2019 1100; she says that her eyes get blurry sometimes for the past week, and she had a recent eye exam, and new glasses for reading; the pt says that she also has been feeling jittery for the past week, and taking 1/2 Xanax tablet (0.5 mg) does help; the pt says that her symptoms were occurring prior to her BP medication changed per cardiology; she can move her chin to chest without pain or difficulty; the pt is scheduled to be seen by Dr Jonni Sanger, [place] 04/27/2019; she would like to be seen today instead; recommendations made per nurse triage protocol; she verbalized understanding; spoke with Watt Climes, and she requested that this information be sent to office for review; the pt can be contacted at [phone].   Reason for Disposition . [1] MODERATE headache (e.g., interferes with normal activities) AND [2] present > 24 hours AND [3] unexplained  (Exceptions: analgesics not tried, typical migraine, or headache part of viral illness)  Answer Assessment - Initial Assessment Questions 1. LOCATION: "Where does it hurt?"      middlle of back of neck to head 2. ONSET: "When did the headache start?" (Minutes, hours or days)      04/16/2019 3. PATTERN: "Does the pain come and go, or has it been constant since it started?"     constant 4. SEVERITY: "How bad is the pain?" and "What does it keep you from doing?"  (e.g., Scale 1-10; mild, moderate, or severe)   - MILD (1-3): doesn't interfere with normal activities    - MODERATE (4-7): interferes with normal  activities or awakens from sleep    - SEVERE (8-10): excruciating pain, unable to do any normal activities        7-8 out of 10 5. RECURRENT SYMPTOM: "Have you ever had headaches before?" If so, ask: "When was the last time?" and "What happened that time?"     Yes pt has hx of vertigo and migraines years ago 6. CAUSE: "What do you think is causing the headache?"    Not sure 7. MIGRAINE: "Have you been diagnosed with migraine headaches?" If so, ask: "Is this headache similar?"     Yes; pt does not remember 8. HEAD INJURY: "Has there been any recent injury to the head?"     no 9. OTHER SYMPTOMS: "Do you have any other symptoms?" (fever, stiff neck, eye pain, sore throat, cold symptoms)    "tightness in head"; blurred vision at times 10. PREGNANCY: "Is there any chance you are pregnant?" "When was your last menstrual period?"     n/a  Protocols used: HEADACHE-A-AH

## 2019-04-23 NOTE — Telephone Encounter (Signed)
See note

## 2019-04-23 NOTE — Telephone Encounter (Signed)
Please advise 

## 2019-04-23 NOTE — Telephone Encounter (Signed)
Called patient she agreed to go to urgent care for evaluation

## 2019-04-27 ENCOUNTER — Ambulatory Visit (INDEPENDENT_AMBULATORY_CARE_PROVIDER_SITE_OTHER): Payer: Medicare Other | Admitting: Family Medicine

## 2019-04-27 ENCOUNTER — Encounter: Payer: Self-pay | Admitting: Family Medicine

## 2019-04-27 VITALS — BP 110/70 | Ht 62.0 in | Wt 156.0 lb

## 2019-04-27 DIAGNOSIS — B349 Viral infection, unspecified: Secondary | ICD-10-CM

## 2019-04-27 NOTE — Progress Notes (Signed)
Virtual Visit via Video Note  Subjective  CC:  Chief Complaint  Patient presents with  . Fatigue  . Nausea  . Dizziness     I connected with Terri Barajas on 04/27/19 at 11:20 AM EST by a video enabled telemedicine application and verified that I am speaking with the correct person using two identifiers. Location patient: Home Location provider: [facility] [place], [facility] Persons participating in the virtual visit: Terri Barajas, Leamon Arnt, MD Gertie Exon, CMA  I discussed the limitations of evaluation and management by telemedicine and the availability of in person appointments. The patient expressed understanding and agreed to proceed. HPI: Terri Barajas is a 73 y.o. female who was contacted today to address the problems listed above in the chief complaint. . 72 yo who had nausea, decreased appetite, occipital headache and malaise w/o fevers, URI symptoms, cough, shortness of breath, loss of taste or smell, abdominal pain, vomiting or diarrhea.  Symptoms lasted for several days.  However she is now symptom-free for the last 48 hours.  She wonders if she had Covid.  Assessment  1. Viral illness      Plan   Presumed viral illness: Now resolved.  Reassured.  Cannot be sure this is not novel coronavirus but given resolution of symptoms and atypical symptoms, do not recommend testing at this time.  Continue social distancing, wearing a mask and frequent handwashing.  She is a high risk patient due to age tends to stay home for much of her time.  If symptoms return, she will let me know. I discussed the assessment and treatment plan with the patient. The patient was provided an opportunity to ask questions and all were answered. The patient agreed with the plan and demonstrated an understanding of the instructions.   The patient was advised to call back or seek an in-person evaluation if the symptoms worsen or if the condition fails to  improve as anticipated. Follow up: As needed Visit date not found  No orders of the defined types were placed in this encounter.     I reviewed the patients updated PMH, FH, and SocHx.    Patient Active Problem List   Diagnosis Date Noted  . OSA on CPAP 01/06/2019  . Abnormal CT of the abdomen 10/04/2018  . Radiculopathy affecting upper extremity, left 07/25/2018  . Hyperlipidemia 05/13/2018  . Situational anxiety 04/29/2018  . MVP (mitral valve prolapse) 04/29/2018  . Sleep-disordered breathing 04/29/2018  . Family History of Hyperthermia, malignant 04/23/2018  . Barrett's esophagus 03/01/2018  . Elevated lipase 02/18/2018  . Hiatal hernia 02/18/2018  . Constipation 02/18/2018  . Bloating 02/18/2018  . Aortic atherosclerosis ([place]) 01/23/2018  . GERD (gastroesophageal reflux disease)   . Glaucoma   . Hypertension    Current Meds  Medication Sig  . ALPRAZolam (XANAX) 0.5 MG tablet Take 0.5 tablets (0.25 mg total) by mouth daily as needed for anxiety.  Marland Kitchen amLODipine (NORVASC) 5 MG tablet Take 1 tablet (5 mg total) by mouth daily.  Marland Kitchen aspirin 81 MG chewable tablet Chew 81 mg by mouth daily.  Marland Kitchen BIOTIN PO Take 1 tablet by mouth daily.   . Cholecalciferol (VITAMIN D3) 25 MCG (1000 UT) CAPS Take 1,000 Units by mouth daily.  Marland Kitchen dexlansoprazole (DEXILANT) 60 MG capsule Take 1 capsule (60 mg total) by mouth daily.  Marland Kitchen latanoprost (XALATAN) 0.005 % ophthalmic solution Place 1 drop into both eyes at bedtime.   Marland Kitchen  meloxicam (MOBIC) 15 MG tablet Take 1 tablet (15 mg total) by mouth daily. (Patient taking differently: Take 15 mg by mouth as needed. )  . metoprolol tartrate (LOPRESSOR) 25 MG tablet Take 25 mg by mouth 2 (two) times daily.  . Multiple Vitamins-Minerals (CENTRUM SILVER 50+WOMEN PO) Take 1 tablet by mouth daily.  . Omega-3 Fatty Acids (FISH OIL PO) Take 1 capsule by mouth daily.  . polycarbophil (FIBERCON) 625 MG tablet Take 625 mg by mouth daily.  . polyethylene glycol (MIRALAX  / GLYCOLAX) packet Take 17 g by mouth daily.    Allergies: Patient is allergic to atracurium & derivatives; atropine; iodine; shellfish allergy; and statins. Family History: Patient family history includes Alcohol abuse in her father; Arthritis in her brother, sister, sister, and sister; Asthma in her sister; Colon polyps in her brother; Congenital heart disease in her brother; Diabetes in her brother; Glaucoma in her brother; Hyperlipidemia in her brother; Hypertension in her brother and sister; Pancreatic cancer in her mother; Prostate cancer in her brother. Social History:  Patient  reports that she has never smoked. She has never used smokeless tobacco. She reports previous alcohol use. She reports previous drug use.  Review of Systems: Constitutional: Negative for fever malaise or anorexia Cardiovascular: negative for chest pain Respiratory: negative for SOB or persistent cough Gastrointestinal: negative for abdominal pain  OBJECTIVE Vitals: BP 110/70   Ht 5\' 2"  (1.575 m)   Wt 156 lb (70.8 kg)   LMP  (LMP Unknown)   BMI 28.53 kg/m  General: no acute distress , A&Ox3  Leamon Arnt, MD

## 2019-05-23 DIAGNOSIS — G4733 Obstructive sleep apnea (adult) (pediatric): Secondary | ICD-10-CM | POA: Diagnosis not present

## 2019-05-27 ENCOUNTER — Ambulatory Visit (INDEPENDENT_AMBULATORY_CARE_PROVIDER_SITE_OTHER): Payer: Medicare Other | Admitting: Pharmacist

## 2019-05-27 ENCOUNTER — Other Ambulatory Visit: Payer: Self-pay

## 2019-05-27 VITALS — BP 112/68 | HR 79 | Resp 14 | Ht 62.0 in | Wt 158.2 lb

## 2019-05-27 DIAGNOSIS — I10 Essential (primary) hypertension: Secondary | ICD-10-CM

## 2019-05-27 MED ORDER — HYDROCHLOROTHIAZIDE 25 MG PO TABS: 25.0000 mg | ORAL_TABLET | Freq: Every day | ORAL | 3 refills | Status: DC

## 2019-05-27 NOTE — Patient Instructions (Signed)
Return for a  follow up appointment as needed  Check your blood pressure at home daily (if able) and keep record of the readings.  Take your BP meds as follows: *NO CHANGE IN MEDICATION*  Bring all of your meds, your BP cuff and your record of home blood pressures to your next appointment.  Exercise as you're able, try to walk approximately 30 minutes per day.  Keep salt intake to a minimum, especially watch canned and prepared boxed foods.  Eat more fresh fruits and vegetables and fewer canned items.  Avoid eating in fast food restaurants.    HOW TO TAKE YOUR BLOOD PRESSURE: . Rest 5 minutes before taking your blood pressure. .  Don't smoke or drink caffeinated beverages for at least 30 minutes before. . Take your blood pressure before (not after) you eat. . Sit comfortably with your back supported and both feet on the floor (don't cross your legs). . Elevate your arm to heart level on a table or a desk. . Use the proper sized cuff. It should fit smoothly and snugly around your bare upper arm. There should be enough room to slip a fingertip under the cuff. The bottom edge of the cuff should be 1 inch above the crease of the elbow. . Ideally, take 3 measurements at one sitting and record the average.

## 2019-05-27 NOTE — Progress Notes (Signed)
Patient ID: Terri Barajas                 DOB: 08/15/1945                      MRN: [id number]     HPI:  Terri Barajas is a 73 y.o. female referred by Dr. Oval Linsey to HTN clinic. PMH includes HTN, hyperlipidemia, mitral valve prolapse, palpitation, and pre-diabetes.  Patient stopped taking amlodipine after experiencing LEE and resumed her HCTZ 25mg  every day. She report increasing her physical activity but unable to exercise every day due to problems with her plantar fasciitis.  Current HTN meds:  Amlodipine 5mg  daily - stopped 05/22/2019 HCTZ 25mg  daily  Metoprolol tartrate 25mg  twice daily  Intolerance: none  BP goal: <130/80  Family History: family history includes Alcohol abuse in her father; Arthritis in her brother, sister, sister, and sister; Asthma in her sister; Colon polyps in her brother; Congenital heart disease in her brother; Diabetes in her brother; Glaucoma in her brother; Hyperlipidemia in her brother; Hypertension in her brother and sister; Pancreatic cancer in her mother; Prostate cancer in her brother.   Social History: patient  reports that she has never smoked. She has never used smokeless tobacco. She reports previous alcohol use. She reports previous drug use.   Diet: mainly home cooked meals, occasional impossible burger, keep sodium low and avod adding salt to cooked food.  Exercise: 30 minutes stationary bike 2-3 times per week  Home BP readings:  [employer] arm cuff - accurate within 49mmHg 16 readings; average 118/75; HR range 62-98bpm  Wt Readings from Last 3 Encounters:  05/27/19 158 lb 3.2 oz (71.8 kg)  04/27/19 156 lb (70.8 kg)  04/15/19 158 lb (71.7 kg)   BP Readings from Last 3 Encounters:  05/27/19 112/68  04/27/19 110/70  04/15/19 (!) 150/87   Pulse Readings from Last 3 Encounters:  05/27/19 79  04/15/19 79  01/06/19 87    Past Medical History:  Diagnosis Date  . Anxiety   . Arthritis   . Complication of anesthesia    some type of  sedation medication made her feel crazy  . Dysrhythmia   . Family history of adverse reaction to anesthesia    daughter has Malignant Hyperthermia hx -   . GERD (gastroesophageal reflux disease)   . Glaucoma   . H/O: hysterectomy 1978  . Heart disease   . History of hiatal hernia   . Hypertension   . MVP (mitral valve prolapse)   . Panic attacks   . Pre-diabetes     Current Outpatient Medications on File Prior to Visit  Medication Sig Dispense Refill  . ALPRAZolam (XANAX) 0.5 MG tablet Take 0.5 tablets (0.25 mg total) by mouth daily as needed for anxiety. 45 tablet 0  . aspirin 81 MG chewable tablet Chew 81 mg by mouth daily.    Marland Kitchen BIOTIN PO Take 1 tablet by mouth daily.     . Cholecalciferol (VITAMIN D3) 25 MCG (1000 UT) CAPS Take 1,000 Units by mouth daily.    Marland Kitchen dexlansoprazole (DEXILANT) 60 MG capsule Take 1 capsule (60 mg total) by mouth daily. 30 capsule 11  . latanoprost (XALATAN) 0.005 % ophthalmic solution Place 1 drop into both eyes at bedtime.     . metoprolol tartrate (LOPRESSOR) 25 MG tablet Take 25 mg by mouth 2 (two) times daily.    . Multiple Vitamins-Minerals (CENTRUM SILVER 50+WOMEN PO) Take 1 tablet by mouth  daily.    . Omega-3 Fatty Acids (FISH OIL PO) Take 1 capsule by mouth daily.    . polycarbophil (FIBERCON) 625 MG tablet Take 625 mg by mouth daily.    . polyethylene glycol (MIRALAX / GLYCOLAX) packet Take 17 g by mouth daily.     No current facility-administered medications on file prior to visit.    Allergies  Allergen Reactions  . Atracurium & Derivatives Other (See Comments)    Unknown  . Atropine Other (See Comments)    Heart racing  . Iodine Other (See Comments)    Unknown  . Shellfish Allergy Other (See Comments)    Unknown  . Statins     "didn't feel right"    Blood pressure 112/68, pulse 79, resp. rate 14, height 5\' 2"  (1.575 m), weight 158 lb 3.2 oz (71.8 kg), SpO2 94 %.  Hypertension Blood pressure is well controlled today. Patient  reports ADR with amlodipine but able to tolerate HCTZ 25mg  daily. She is compliant with positive lifestyle modifications and medication. Will continue current therapy without changes and follow up as needed.   Rosaline Ezekiel Rodriguez-Guzman PharmD, BCPS, [place] [address] [place], [address] 05/31/2019 8:36 AM

## 2019-05-29 DIAGNOSIS — G4733 Obstructive sleep apnea (adult) (pediatric): Secondary | ICD-10-CM | POA: Diagnosis not present

## 2019-05-31 ENCOUNTER — Encounter: Payer: Self-pay | Admitting: Pharmacist

## 2019-05-31 ENCOUNTER — Other Ambulatory Visit: Payer: Self-pay | Admitting: *Deleted

## 2019-05-31 MED ORDER — HYDROCHLOROTHIAZIDE 25 MG PO TABS: 25.0000 mg | ORAL_TABLET | Freq: Every day | ORAL | 3 refills | Status: DC

## 2019-05-31 NOTE — Assessment & Plan Note (Signed)
Blood pressure is well controlled today. Patient reports ADR with amlodipine but able to tolerate HCTZ 25mg  daily. She is compliant with positive lifestyle modifications and medication. Will continue current therapy without changes and follow up as needed.

## 2019-06-07 ENCOUNTER — Telehealth: Payer: Self-pay | Admitting: *Deleted

## 2019-06-07 NOTE — Telephone Encounter (Signed)
Pt is calling inquiring if MD could prescribe something other than Metoprolol. Pt report the medication causes her to have a headache.

## 2019-06-07 NOTE — Telephone Encounter (Signed)
Pt called in stating that the Metoprolol made her head hurt and she wanted to go to [facility]?  Please advise!

## 2019-06-09 ENCOUNTER — Other Ambulatory Visit: Payer: Self-pay | Admitting: Pharmacist Clinician (PhC)/ Clinical Pharmacy Specialist

## 2019-06-09 MED ORDER — CARVEDILOL 3.125 MG PO TABS: 3.1250 mg | ORAL_TABLET | Freq: Two times a day (BID) | ORAL | 3 refills | Status: DC

## 2019-06-09 NOTE — Telephone Encounter (Signed)
Pt unable to tolerate metopolol, has quit taking.  Will switch to carvedilol 3.125 mg bid.

## 2019-06-10 NOTE — Telephone Encounter (Signed)
Ok.  Let's try diltiazem 30mg  bid instead

## 2019-06-21 NOTE — Telephone Encounter (Signed)
Pharm D had responded via mychart to patient   [name], RPH-CPP to [name], [place]     06/09/19 4:08 PM Terri Barajas  I apologize that we did not get back to you sooner.  I have called in a new prescription to your pharmacy for carvedilol 3.125 mg tablets to replace the metoprolol.  Please take 1 tablet twice daily.  Let us know if you have any issues or concerns.   Tommy Medal PharmD CPP [place]  Will forward to Dr Oval Linsey for review

## 2019-06-28 ENCOUNTER — Ambulatory Visit: Payer: Medicare Other | Admitting: Podiatry

## 2019-06-28 ENCOUNTER — Other Ambulatory Visit: Payer: Self-pay

## 2019-06-28 DIAGNOSIS — M722 Plantar fascial fibromatosis: Secondary | ICD-10-CM

## 2019-06-28 NOTE — Patient Instructions (Signed)
Pre-Operative Instructions  Congratulations, you have decided to take an important step towards improving your quality of life.  You can be assured that the doctors and staff at [facility] will be with you every step of the way.  Here are some important things you should know:  1. Plan to be at the surgery center/hospital at least 1 (one) hour prior to your scheduled time, unless otherwise directed by the surgical center/hospital staff.  You must have a responsible adult accompany you, remain during the surgery and drive you home.  Make sure you have directions to the surgical center/hospital to ensure you arrive on time. 2. If you are having surgery at [facility] or [facility] hospitals, you will need a copy of your medical history and physical form from your family physician within one month prior to the date of surgery. We will give you a form for your primary physician to complete.  3. We make every effort to accommodate the date you request for surgery.  However, there are times where surgery dates or times have to be moved.  We will contact you as soon as possible if a change in schedule is required.   4. No aspirin/ibuprofen for one week before surgery.  If you are on aspirin, any non-steroidal anti-inflammatory medications (Mobic, Aleve, Ibuprofen) should not be taken seven (7) days prior to your surgery.  You make take Tylenol for pain prior to surgery.  5. Medications - If you are taking daily heart and blood pressure medications, seizure, reflux, allergy, asthma, anxiety, pain or diabetes medications, make sure you notify the surgery center/hospital before the day of surgery so they can tell you which medications you should take or avoid the day of surgery. 6. No food or drink after midnight the night before surgery unless directed otherwise by surgical center/hospital staff. 7. No alcoholic beverages 24-hours prior to surgery.  No smoking 24-hours prior or 24-hours after  surgery. 8. Wear loose pants or shorts. They should be loose enough to fit over bandages, boots, and casts. 9. Don't wear slip-on shoes. Sneakers are preferred. 10. Bring your boot with you to the surgery center/hospital.  Also bring crutches or a walker if your physician has prescribed it for you.  If you do not have this equipment, it will be provided for you after surgery. 11. If you have not been contacted by the surgery center/hospital by the day before your surgery, call to confirm the date and time of your surgery. 12. Leave-time from work may vary depending on the type of surgery you have.  Appropriate arrangements should be made prior to surgery with your employer. 13. Prescriptions will be provided immediately following surgery by your doctor.  Fill these as soon as possible after surgery and take the medication as directed. Pain medications will not be refilled on weekends and must be approved by the doctor. 14. Remove nail polish on the operative foot and avoid getting pedicures prior to surgery. 15. Wash the night before surgery.  The night before surgery wash the foot and leg well with water and the antibacterial soap provided. Be sure to pay special attention to beneath the toenails and in between the toes.  Wash for at least three (3) minutes. Rinse thoroughly with water and dry well with a towel.  Perform this wash unless told not to do so by your physician.  Enclosed: 1 Ice pack (please put in freezer the night before surgery)   1 Hibiclens skin cleaner     Pre-op instructions  If you have any questions regarding the instructions, please do not hesitate to call our office.  [place]: [address], [place], [place] [address] -- [phone]  [place]: [address] [place], [place] [address] -- [phone]  [place]: [address], [name], [place] [address] -- [phone]   Website: [email or web address] 

## 2019-07-01 DIAGNOSIS — L219 Seborrheic dermatitis, unspecified: Secondary | ICD-10-CM | POA: Diagnosis not present

## 2019-07-01 DIAGNOSIS — L710 Perioral dermatitis: Secondary | ICD-10-CM | POA: Diagnosis not present

## 2019-07-01 NOTE — Progress Notes (Signed)
   Subjective: 74 y.o. female presenting today for follow up evaluation of plantar fasciitis of the right foot. She reports continued pain that is unchanged since her last appointment. She states the injection did not provide any relief. She has been using the plantar fascial brace and doing the foot exercises as directed which helped ease her symptoms. Being on the foot increases her pain. Patient is here for further evaluation and treatment.   Past Medical History:  Diagnosis Date  . Anxiety   . Arthritis   . Complication of anesthesia    some type of sedation medication made her feel crazy  . Dysrhythmia   . Family history of adverse reaction to anesthesia    daughter has Malignant Hyperthermia hx -   . GERD (gastroesophageal reflux disease)   . Glaucoma   . H/O: hysterectomy 1978  . Heart disease   . History of hiatal hernia   . Hypertension   . MVP (mitral valve prolapse)   . Panic attacks   . Pre-diabetes      Objective: Physical Exam General: The patient is alert and oriented x3 in no acute distress.  Dermatology: Skin is warm, dry and supple bilateral lower extremities. Negative for open lesions or macerations bilateral.   Vascular: Dorsalis Pedis and Posterior Tibial pulses palpable bilateral.  Capillary fill time is immediate to all digits.  Neurological: Epicritic and protective threshold intact bilateral.   Musculoskeletal: Tenderness to palpation to the plantar aspect of the right heel along the plantar fascia. All other joints range of motion within normal limits bilateral. Strength 5/5 in all groups bilateral.    Assessment: 1. Plantar fasciitis right  Plan of Care:  1. Patient evaluated. 2. Today we discussed the conservative versus surgical management of the presenting pathology. The patient opts for surgical management. All possible complications and details of the procedure were explained. All patient questions were answered. No guarantees were expressed  or implied. 3. Authorization for surgery was initiated today. Surgery will consist of EPF right.  4. Return to clinic one week post op.    Recently moved from [place].    Edrick Kins, DPM [facility]  Dr. Edrick Kins, DPM    [address], [place] [address]                Office [phone]  Fax [phone]

## 2019-07-04 ENCOUNTER — Other Ambulatory Visit: Payer: Self-pay

## 2019-07-04 ENCOUNTER — Encounter (HOSPITAL_COMMUNITY): Payer: Self-pay

## 2019-07-04 ENCOUNTER — Ambulatory Visit (INDEPENDENT_AMBULATORY_CARE_PROVIDER_SITE_OTHER): Payer: Medicare Other

## 2019-07-04 ENCOUNTER — Ambulatory Visit (HOSPITAL_COMMUNITY)
Admission: EM | Admit: 2019-07-04 | Discharge: 2019-07-04 | Disposition: A | Discharge status: Home / Self Care | Payer: Medicare Other | Attending: Physician Assistant | Admitting: Physician Assistant

## 2019-07-04 DIAGNOSIS — Z20822 Contact with and (suspected) exposure to covid-19: Secondary | ICD-10-CM

## 2019-07-04 DIAGNOSIS — U071 COVID-19: Secondary | ICD-10-CM | POA: Insufficient documentation

## 2019-07-04 DIAGNOSIS — B349 Viral infection, unspecified: Secondary | ICD-10-CM

## 2019-07-04 DIAGNOSIS — R05 Cough: Secondary | ICD-10-CM | POA: Diagnosis not present

## 2019-07-04 NOTE — ED Provider Notes (Signed)
[place]    CSN: [id number] Arrival date & time: 07/04/19  1138      History   Chief Complaint Chief Complaint  Patient presents with  . Appointment    11:30  . Generalized Body Aches    HPI Terri Barajas is a 74 y.o. female.   Patient reports to urgent care today for 4 days of body aches, chills, productive cough and mild shortness of breath. She reports symptoms started Wednesday of last week and felt like a bad cold initially with runny nose and congestion. Overall she endorses some general improvement, however she continues to have a productive cough with yellow sputum. She has not had a fever when taken. She has taken aleeve for body ache. She also reports a mild headache as well. She denies nausea, vomiting, diarrhea, sore throat, chest pain.   She is accompanied by her husband today and they would both like covid testing. No recent sick exposures.      Past Medical History:  Diagnosis Date  . Anxiety   . Arthritis   . Complication of anesthesia    some type of sedation medication made her feel crazy  . Dysrhythmia   . Family history of adverse reaction to anesthesia    daughter has Malignant Hyperthermia hx -   . GERD (gastroesophageal reflux disease)   . Glaucoma   . H/O: hysterectomy 1978  . Heart disease   . History of hiatal hernia   . Hypertension   . MVP (mitral valve prolapse)   . Panic attacks   . Pre-diabetes     Patient Active Problem List   Diagnosis Date Noted  . OSA on CPAP 01/06/2019  . Abnormal CT of the abdomen 10/04/2018  . Radiculopathy affecting upper extremity, left 07/25/2018  . Hyperlipidemia 05/13/2018  . Situational anxiety 04/29/2018  . MVP (mitral valve prolapse) 04/29/2018  . Sleep-disordered breathing 04/29/2018  . Family History of Hyperthermia, malignant 04/23/2018  . Barrett's esophagus 03/01/2018  . Elevated lipase 02/18/2018  . Hiatal hernia 02/18/2018  . Constipation 02/18/2018  . Bloating  02/18/2018  . Aortic atherosclerosis ([place]) 01/23/2018  . GERD (gastroesophageal reflux disease)   . Glaucoma   . Hypertension     Past Surgical History:  Procedure Laterality Date  . APPENDECTOMY  1972  . BIOPSY  07/06/2018   Procedure: BIOPSY;  Surgeon: Rush Landmark Telford Nab., MD;  Location: [facility];  Service: Gastroenterology;;  . CATARACT EXTRACTION  2013  . [place]  . COLONOSCOPY    . ESOPHAGOGASTRODUODENOSCOPY (EGD) WITH PROPOFOL N/A 07/06/2018   Procedure: ESOPHAGOGASTRODUODENOSCOPY (EGD) WITH PROPOFOL;  Surgeon: Rush Landmark Telford Nab., MD;  Location: [facility];  Service: Gastroenterology;  Laterality: N/A;  . HAND SURGERY Bilateral   . HEMORRHOID SURGERY  2018  . S/P Hysterectomy   1987    OB History   No obstetric history on file.      Home Medications    Prior to Admission medications   Medication Sig Start Date End Date Taking? Authorizing Provider  latanoprost (XALATAN) 0.005 % ophthalmic solution Place 1 drop into both eyes at bedtime.    Yes [provider]  ALPRAZolam Duanne Moron) 0.5 MG tablet Take 0.5 tablets (0.25 mg total) by mouth daily as needed for anxiety. 04/01/19   Briscoe Deutscher, DO  aspirin 81 MG chewable tablet Chew 81 mg by mouth daily.    [provider]  BIOTIN PO Take 1 tablet by mouth daily.  [provider]  carvedilol (COREG) 3.125 MG tablet Take 1 tablet (3.125 mg total) by mouth 2 (two) times daily. 06/09/19 09/07/19  Skeet Latch, MD  Cholecalciferol (VITAMIN D3) 25 MCG (1000 UT) CAPS Take 1,000 Units by mouth daily.    [provider]  dexlansoprazole (DEXILANT) 60 MG capsule Take 1 capsule (60 mg total) by mouth daily. 10/01/18   Mansouraty, Telford Nab., MD  hydrochlorothiazide (HYDRODIURIL) 25 MG tablet Take 1 tablet (25 mg total) by mouth daily. 05/31/19 08/29/19  Skeet Latch, MD  metoprolol tartrate (LOPRESSOR) 25 MG tablet Take 25 mg by mouth 2 (two) times  daily.    [provider]  Multiple Vitamins-Minerals (CENTRUM SILVER 50+WOMEN PO) Take 1 tablet by mouth daily.    [provider]  Omega-3 Fatty Acids (FISH OIL PO) Take 1 capsule by mouth daily.    [provider]  polycarbophil (FIBERCON) 625 MG tablet Take 625 mg by mouth daily.    [provider]  polyethylene glycol (MIRALAX / GLYCOLAX) packet Take 17 g by mouth daily.    [provider]    Family History Family History  Problem Relation Age of Onset  . Pancreatic cancer Mother   . Alcohol abuse Father   . Asthma Sister   . Hypertension Sister   . Arthritis Sister   . Diabetes Brother   . Arthritis Brother   . Hyperlipidemia Brother   . Hypertension Brother   . Glaucoma Brother   . Colon polyps Brother   . Arthritis Sister   . Arthritis Sister   . Prostate cancer Brother   . Congenital heart disease Brother   . Colon cancer Neg Hx   . Esophageal cancer Neg Hx   . Inflammatory bowel disease Neg Hx   . Liver disease Neg Hx   . Rectal cancer Neg Hx   . Stomach cancer Neg Hx     Social History Social History   Tobacco Use  . Smoking status: Never Smoker  . Smokeless tobacco: Never Used  Substance Use Topics  . Alcohol use: Not Currently  . Drug use: Not Currently     Allergies   Atracurium & derivatives, Atropine, Iodine, Shellfish allergy, and Statins   Review of Systems Review of Systems  Constitutional: Positive for chills and fatigue. Negative for fever.  HENT: Positive for congestion and rhinorrhea. Negative for ear pain, sinus pressure, sinus pain and sore throat.   Eyes: Negative for pain and visual disturbance.  Respiratory: Positive for cough (productive) and shortness of breath.   Cardiovascular: Negative for chest pain, palpitations and leg swelling.  Gastrointestinal: Negative for abdominal pain, diarrhea, nausea and vomiting.  Musculoskeletal: Positive for arthralgias, back pain and myalgias.  Skin:  Negative for color change and rash.  Neurological: Positive for headaches. Negative for syncope.  All other systems reviewed and are negative.    Physical Exam Triage Vital Signs ED Triage Vitals  Enc Vitals Group     BP      Pulse      Resp      Temp      Temp src      SpO2      Weight      Height      Head Circumference      Peak Flow      Pain Score      Pain Loc      Pain Edu?      Excl. in [place]?  No data found.  Updated Vital Signs BP 121/79 (BP Location: Right Arm)   Temp 99.8 F (37.7 C) (Oral)   Resp 18   LMP  (LMP Unknown)   SpO2 99%   Visual Acuity Right Eye Distance:   Left Eye Distance:   Bilateral Distance:    Right Eye Near:   Left Eye Near:    Bilateral Near:     Physical Exam Vitals and nursing note reviewed.  Constitutional:      General: She is not in acute distress.    Appearance: She is well-developed and normal weight. She is ill-appearing.  HENT:     Head: Normocephalic and atraumatic.  Eyes:     General: No scleral icterus.    Conjunctiva/sclera: Conjunctivae normal.     Pupils: Pupils are equal, round, and reactive to light.  Cardiovascular:     Rate and Rhythm: Normal rate and regular rhythm.     Heart sounds: No murmur. No friction rub. No gallop.   Pulmonary:     Effort: Pulmonary effort is normal. No respiratory distress.     Breath sounds: No wheezing.     Comments: Fine crackles heard over right base Abdominal:     Tenderness: There is no abdominal tenderness.  Musculoskeletal:     Cervical back: Neck supple.     Right lower leg: No edema.     Left lower leg: No edema.  Skin:    General: Skin is warm and dry.  Neurological:     General: No focal deficit present.     Mental Status: She is alert and oriented to person, place, and time.  Psychiatric:        Mood and Affect: Mood normal.        Behavior: Behavior normal.        Thought Content: Thought content normal.        Judgment: Judgment normal.      UC  Treatments / Results  Labs (all labs ordered are listed, but only abnormal results are displayed) Labs Reviewed  NOVEL CORONAVIRUS, NAA (HOSP ORDER, SEND-OUT TO REF LAB; TAT 18-24 HRS)    EKG   Radiology DG Chest 2 View  Result Date: 07/04/2019 CLINICAL DATA:  Per pt: sick for five days, no fever, cough, chills, headache, no appetite, body aches. No covid testing. No history of respiratory, history of micro valve prolapse. Non smoker. HBP controlled with medication. No diabetes. EXAM: CHEST - 2 VIEW COMPARISON:  none FINDINGS: Prominent bibasilar interstitial markings, of indeterminate chronicity. No confluent airspace disease. Heart size and mediastinal contours are within normal limits. No effusion. No pneumothorax. Anterior vertebral endplate spurring at multiple levels in the mid thoracic spine. IMPRESSION: Bibasilar interstitial prominence of indeterminate chronicity. Electronically Signed   By: Lucrezia Europe M.D.   On: 07/04/2019 14:11    Procedures Procedures (including critical care time)  Medications Ordered in UC Medications - No data to display  Initial Impression / Assessment and Plan / UC Course  I have reviewed the triage vital signs and the nursing notes.  Pertinent labs & imaging results that were available during my care of the patient were reviewed by me and considered in my medical decision making (see chart for details).     #Viral Illness - Viral syndrome with overall improvement. No evidence of pneumonia on xray. Unclear etiology at this time for interstitial prominence, no history of heart failure, saturating well. Given she reports improvement, will recommend monitoring. PCP follow up  if no improvement in 2 weeks. COVID PCR Sent. Symptomatic management discussed. Precautions given  Final Clinical Impressions(s) / UC Diagnoses   Final diagnoses:  Viral illness  Encounter for laboratory testing for COVID-19 virus     Discharge Instructions     Take tylenol  325mg  tablet x 2 up to every 6 hours for sore throat, fever and body aches. Do not exceed 8 tablets in 24 hours  You may also take aleeve 2 times a day for this  I will call you with the final results of your chest xray, currently I do not feel there is anything alarming.   Go directly to the Emergency Department or call 911 if you have severe chest pain, worsening shortness of breath, develop high fever, severe diarrhea or feel as though you might pass out.   If your Covid-19 test is positive, you will receive a phone call from [facility] regarding your results. Negative test results are not called. Both positive and negative results area always visible on MyChart. If you do not have a MyChart account, sign up instructions are in your discharge papers.   Persons who are directed to care for themselves at home may discontinue isolation under the following conditions:  . At least 10 days have passed since symptom onset and . At least 24 hours have passed without running a fever (this means without the use of fever-reducing medications) and . Other symptoms have improved.  Persons infected with COVID-19 who never develop symptoms may discontinue isolation and other precautions 10 days after the date of their first positive COVID-19 test.     ED Prescriptions    None     PDMP not reviewed this encounter.   Purnell Shoemaker, PA-C 07/04/19 1423

## 2019-07-04 NOTE — Discharge Instructions (Addendum)
Take tylenol 325mg  tablet x 2 up to every 6 hours for sore throat, fever and body aches. Do not exceed 8 tablets in 24 hours  You may also take aleeve 2 times a day for this  I will call you with the final results of your chest xray, currently I do not feel there is anything alarming.   Go directly to the Emergency Department or call 911 if you have severe chest pain, worsening shortness of breath, develop high fever, severe diarrhea or feel as though you might pass out.   If your Covid-19 test is positive, you will receive a phone call from [facility] regarding your results. Negative test results are not called. Both positive and negative results area always visible on MyChart. If you do not have a MyChart account, sign up instructions are in your discharge papers.   Persons who are directed to care for themselves at home may discontinue isolation under the following conditions:   At least 10 days have passed since symptom onset and  At least 24 hours have passed without running a fever (this means without the use of fever-reducing medications) and  Other symptoms have improved.  Persons infected with COVID-19 who never develop symptoms may discontinue isolation and other precautions 10 days after the date of their first positive COVID-19 test.

## 2019-07-04 NOTE — ED Triage Notes (Signed)
Patient presents to Urgent Care with complaints of generalized body aces since 4 days ago. Patient reports she has not been febrile, would like covid testing.

## 2019-07-05 LAB — NOVEL CORONAVIRUS, NAA (HOSP ORDER, SEND-OUT TO REF LAB; TAT 18-24 HRS): SARS-CoV-2, NAA: DETECTED — AB

## 2019-07-06 ENCOUNTER — Other Ambulatory Visit: Payer: Self-pay | Admitting: Physician Assistant

## 2019-07-06 ENCOUNTER — Telehealth (HOSPITAL_COMMUNITY): Payer: Self-pay | Admitting: Emergency Medicine

## 2019-07-06 DIAGNOSIS — I341 Nonrheumatic mitral (valve) prolapse: Secondary | ICD-10-CM

## 2019-07-06 DIAGNOSIS — U071 COVID-19: Secondary | ICD-10-CM

## 2019-07-06 NOTE — Progress Notes (Signed)
Hello Breshauna Gebert Gatling,   You have been scheduled to receive bamlanivumab (the monoclonal antibody we discussed) on Thursday 07/08/19 at 0830 AM.  Please arrive 15 minutes early.   The address for the infusion clinic site is:  [place], [place] (previously this was the [place]   When you drive in the main entrance you will see security -  tell them they are there to get an infusion and they will take you to where you need to go.   If you have questions please call [phone].   Should you develop worsening shortness of breath or symptoms over the holiday weekend please do not wait for this appointment and go to the Emergency room for evaluation and treatment.   The day of your visit you should: Marland Kitchen Get plenty of rest the night before and drink plenty of water . Eat a light meal/snack before coming and take your medications as prescribed  . Wear warm, comfortable clothes with a shirt that can roll-up over the elbow (will need IV start).  . Wear a mask  . Consider bringing some activity to help pass the time  I hope this helps find you feeling better,  Montey Hora, PA - C

## 2019-07-06 NOTE — Telephone Encounter (Signed)

## 2019-07-08 ENCOUNTER — Ambulatory Visit (HOSPITAL_COMMUNITY)
Admission: RE | Admit: 2019-07-08 | Discharge: 2019-07-08 | Disposition: A | Discharge status: Home / Self Care | Payer: Medicare Other | Source: Ambulatory Visit | Attending: Pulmonary Disease | Admitting: Pulmonary Disease

## 2019-07-08 DIAGNOSIS — I341 Nonrheumatic mitral (valve) prolapse: Secondary | ICD-10-CM | POA: Insufficient documentation

## 2019-07-08 DIAGNOSIS — U071 COVID-19: Secondary | ICD-10-CM | POA: Insufficient documentation

## 2019-07-08 DIAGNOSIS — Z23 Encounter for immunization: Secondary | ICD-10-CM | POA: Insufficient documentation

## 2019-07-08 MED ORDER — ALBUTEROL SULFATE HFA 108 (90 BASE) MCG/ACT IN AERS: 2.0000 | INHALATION_SPRAY | Freq: Once | RESPIRATORY_TRACT | Status: DC | PRN

## 2019-07-08 MED ORDER — DIPHENHYDRAMINE HCL 50 MG/ML IJ SOLN: 50.0000 mg | Freq: Once | INTRAMUSCULAR | Status: DC | PRN

## 2019-07-08 MED ORDER — EPINEPHRINE 0.3 MG/0.3ML IJ SOAJ: 0.3000 mg | Freq: Once | INTRAMUSCULAR | Status: DC | PRN

## 2019-07-08 MED ORDER — FAMOTIDINE IN NACL 20-0.9 MG/50ML-% IV SOLN: 20.0000 mg | Freq: Once | INTRAVENOUS | Status: DC | PRN

## 2019-07-08 MED ORDER — SODIUM CHLORIDE 0.9 % IV SOLN
INTRAVENOUS | Status: DC | PRN
  Administered 2019-07-08: 250 mL via INTRAVENOUS

## 2019-07-08 MED ORDER — METHYLPREDNISOLONE SODIUM SUCC 125 MG IJ SOLR: 125.0000 mg | Freq: Once | INTRAMUSCULAR | Status: DC | PRN

## 2019-07-08 MED ORDER — SODIUM CHLORIDE 0.9 % IV SOLN
700.0000 mg | Freq: Once | INTRAVENOUS | Status: AC
  Administered 2019-07-08: 09:00:00 700 mg via INTRAVENOUS
  Filled 2019-07-08: qty 20

## 2019-07-08 NOTE — Progress Notes (Signed)
  Diagnosis: COVID-19  Physician: Dr. Joya Gaskins  Procedure: Covid Infusion Clinic Med: bamlanivimab infusion - Provided patient with bamlanimivab fact sheet for patients, parents and caregivers prior to infusion.  Complications: No immediate complications noted.  Discharge: Discharged home   Tia Masker 07/08/2019

## 2019-07-08 NOTE — Discharge Instructions (Signed)

## 2019-07-09 ENCOUNTER — Ambulatory Visit: Payer: Self-pay | Admitting: *Deleted

## 2019-07-09 NOTE — Telephone Encounter (Signed)
Telephone call to Patient who states that she took the infusion.  today. Has developed cough Patient states she has not had any reaction since she had infusion.  Recommended that Patient tak cough medication if she would like.  Patient voiced understanding.  Patient states she has no appetite,  Encouraged Patient to drink plenty of fluids. Gradually add food to diet.  Patient voiced understanding.

## 2019-07-12 ENCOUNTER — Ambulatory Visit (INDEPENDENT_AMBULATORY_CARE_PROVIDER_SITE_OTHER): Payer: Medicare Other | Admitting: Family Medicine

## 2019-07-12 ENCOUNTER — Encounter: Payer: Self-pay | Admitting: Family Medicine

## 2019-07-12 ENCOUNTER — Ambulatory Visit: Payer: Medicare Other | Admitting: Family Medicine

## 2019-07-12 VITALS — Ht 62.0 in | Wt 158.0 lb

## 2019-07-12 DIAGNOSIS — U071 COVID-19: Secondary | ICD-10-CM

## 2019-07-12 MED ORDER — GUAIFENESIN-CODEINE 100-10 MG/5ML PO SOLN: 5.0000 mL | Freq: Four times a day (QID) | ORAL | 0 refills | Status: DC | PRN

## 2019-07-12 NOTE — Patient Instructions (Addendum)
Please call the office to schedule an transfer of care visit with a new physician or provider at the office.   If you have any questions or concerns, please don't hesitate to send me a message via MyChart or call the office at [phone]. Thank you for visiting with [place] today! It's our pleasure caring for you.

## 2019-07-12 NOTE — Progress Notes (Signed)
Virtual Visit via Video Note  Subjective  CC:  Chief Complaint  Patient presents with  . Results    Positive Covid  . Cough     I connected with Eliyah Wiebers Cowley on 07/12/19 at 11:20 AM EST by a video enabled telemedicine application and verified that I am speaking with the correct person using two identifiers. Location patient: Home Location provider: [facility] [place], Office Persons participating in the virtual visit: SHANAH TULLOCH, Leamon Arnt, MD Serita Sheller, [place]  Same day acute visit; PCP not available. New pt to me. Chart reviewed.   I discussed the limitations of evaluation and management by telemedicine and the availability of in person appointments. The patient expressed understanding and agreed to proceed. HPI: ZACHARIA ARIF is a 74 y.o. female who was contacted today to address the problems listed above in the chief complaint. . Reviewed urgent care notes from 1/17; dxd with covid - 19 infection; had cxr with interstitial prominence but no infiltrate. Was treated with infusion and infusion clinic since. Now with cough that has caused chest wall soreness. She denies fevers, chills, n/v, pleuritic cp, or sob. Coughing is keeping her from sleep. She is home in self isolation.   Assessment  1. COVID-19      Plan   covid 19:  S/p infusion therapy. Respiratory status appears stable. rec rob AC and supportive care. To complete 14 days of home isolation. F/u if worsens.   Recommend setting up TOC with new provider at office. Pt to call and schedule.  I discussed the assessment and treatment plan with the patient. The patient was provided an opportunity to ask questions and all were answered. The patient agreed with the plan and demonstrated an understanding of the instructions.   The patient was advised to call back or seek an in-person evaluation if the symptoms worsen or if the condition fails to improve as anticipated. Follow up:  prn  Visit date not found  No orders of the defined types were placed in this encounter.     I reviewed the patients updated PMH, FH, and SocHx.    Patient Active Problem List   Diagnosis Date Noted  . OSA on CPAP 01/06/2019  . Abnormal CT of the abdomen 10/04/2018  . Radiculopathy affecting upper extremity, left 07/25/2018  . Hyperlipidemia 05/13/2018  . Situational anxiety 04/29/2018  . MVP (mitral valve prolapse) 04/29/2018  . Sleep-disordered breathing 04/29/2018  . Family History of Hyperthermia, malignant 04/23/2018  . Barrett's esophagus 03/01/2018  . Elevated lipase 02/18/2018  . Hiatal hernia 02/18/2018  . Constipation 02/18/2018  . Bloating 02/18/2018  . Aortic atherosclerosis ([place]) 01/23/2018  . GERD (gastroesophageal reflux disease)   . Glaucoma   . Hypertension    Current Meds  Medication Sig  . ALPRAZolam (XANAX) 0.5 MG tablet Take 0.5 tablets (0.25 mg total) by mouth daily as needed for anxiety.  Marland Kitchen aspirin 81 MG chewable tablet Chew 81 mg by mouth daily.  Marland Kitchen BIOTIN PO Take 1 tablet by mouth daily.   . carvedilol (COREG) 3.125 MG tablet Take 1 tablet (3.125 mg total) by mouth 2 (two) times daily.  . Cholecalciferol (VITAMIN D3) 25 MCG (1000 UT) CAPS Take 1,000 Units by mouth daily.  Marland Kitchen dexlansoprazole (DEXILANT) 60 MG capsule Take 1 capsule (60 mg total) by mouth daily.  . hydrochlorothiazide (HYDRODIURIL) 25 MG tablet Take 1 tablet (25 mg total) by mouth daily.  Marland Kitchen latanoprost (XALATAN)  0.005 % ophthalmic solution Place 1 drop into both eyes at bedtime.   . metoprolol tartrate (LOPRESSOR) 25 MG tablet Take 25 mg by mouth 2 (two) times daily.  . Multiple Vitamins-Minerals (CENTRUM SILVER 50+WOMEN PO) Take 1 tablet by mouth daily.  . Omega-3 Fatty Acids (FISH OIL PO) Take 1 capsule by mouth daily.  . polycarbophil (FIBERCON) 625 MG tablet Take 625 mg by mouth daily.  . polyethylene glycol (MIRALAX / GLYCOLAX) packet Take 17 g by mouth daily.     Allergies: Patient is allergic to atracurium & derivatives; atropine; iodine; shellfish allergy; and statins. Family History: Patient family history includes Alcohol abuse in her father; Arthritis in her brother, sister, sister, and sister; Asthma in her sister; Colon polyps in her brother; Congenital heart disease in her brother; Diabetes in her brother; Glaucoma in her brother; Hyperlipidemia in her brother; Hypertension in her brother and sister; Pancreatic cancer in her mother; Prostate cancer in her brother. Social History:  Patient  reports that she has never smoked. She has never used smokeless tobacco. She reports previous alcohol use. She reports previous drug use.  Review of Systems: Constitutional: Negative for fever malaise or anorexia Cardiovascular: negative for pleuritic chest pain Respiratory: negative for SOB or persistent cough Gastrointestinal: negative for abdominal pain  OBJECTIVE Vitals: Ht 5\' 2"  (1.575 m)   Wt 158 lb (71.7 kg)   LMP  (LMP Unknown)   BMI 28.90 kg/m  General: no acute distress , A&Ox3, coughing. No tachypnea or retractions. Appears comfortable.  Leamon Arnt, MD

## 2019-07-15 ENCOUNTER — Encounter: Payer: Self-pay | Admitting: Family Medicine

## 2019-07-22 ENCOUNTER — Ambulatory Visit: Payer: Medicare Other

## 2019-07-26 ENCOUNTER — Encounter: Payer: Self-pay | Admitting: Family Medicine

## 2019-07-27 ENCOUNTER — Ambulatory Visit: Payer: Medicare Other | Attending: Internal Medicine

## 2019-07-27 DIAGNOSIS — Z20822 Contact with and (suspected) exposure to covid-19: Secondary | ICD-10-CM

## 2019-07-28 ENCOUNTER — Other Ambulatory Visit: Payer: Self-pay | Admitting: Gastroenterology

## 2019-07-28 ENCOUNTER — Encounter: Payer: Self-pay | Admitting: Family Medicine

## 2019-07-28 LAB — NOVEL CORONAVIRUS, NAA: SARS-CoV-2, NAA: NOT DETECTED

## 2019-08-02 ENCOUNTER — Telehealth: Payer: Self-pay

## 2019-08-02 NOTE — Telephone Encounter (Signed)
Please schedule visit to see Dr. Jonni Sanger. Pt was tested negative for Covid last week, but is still having ongoing sx from covid that she would like to discuss with Dr. Jonni Sanger. Thanks.

## 2019-08-02 NOTE — Telephone Encounter (Signed)
Please call patient to find out what she needs.

## 2019-08-02 NOTE — Telephone Encounter (Signed)
Patient test positive for covid about 5 weeks ago and would like for someone to call her back because pt still have symptoms and pt have additional questions. Pt aware that she do not have PCP at this office.

## 2019-08-02 NOTE — Telephone Encounter (Signed)
Please advise 

## 2019-08-03 ENCOUNTER — Ambulatory Visit (INDEPENDENT_AMBULATORY_CARE_PROVIDER_SITE_OTHER): Payer: Medicare Other | Admitting: Family Medicine

## 2019-08-03 ENCOUNTER — Encounter: Payer: Self-pay | Admitting: Family Medicine

## 2019-08-03 ENCOUNTER — Other Ambulatory Visit: Payer: Self-pay

## 2019-08-03 VITALS — BP 124/80 | HR 95 | Temp 98.6°F | Ht 62.0 in | Wt 149.6 lb

## 2019-08-03 DIAGNOSIS — R450 Nervousness: Secondary | ICD-10-CM | POA: Diagnosis not present

## 2019-08-03 DIAGNOSIS — R05 Cough: Secondary | ICD-10-CM | POA: Diagnosis not present

## 2019-08-03 DIAGNOSIS — R059 Cough, unspecified: Secondary | ICD-10-CM

## 2019-08-03 DIAGNOSIS — U071 COVID-19: Secondary | ICD-10-CM

## 2019-08-03 LAB — CBC WITH DIFFERENTIAL/PLATELET
Basophils Absolute: 0 10*3/uL (ref 0.0–0.1)
Basophils Relative: 0.7 % (ref 0.0–3.0)
Eosinophils Absolute: 0.2 10*3/uL (ref 0.0–0.7)
Eosinophils Relative: 3.2 % (ref 0.0–5.0)
HCT: 36.1 % (ref 36.0–46.0)
Hemoglobin: 12 g/dL (ref 12.0–15.0)
Lymphocytes Relative: 29.5 % (ref 12.0–46.0)
Lymphs Abs: 2 10*3/uL (ref 0.7–4.0)
MCHC: 33.3 g/dL (ref 30.0–36.0)
MCV: 89 fl (ref 78.0–100.0)
Monocytes Absolute: 0.7 10*3/uL (ref 0.1–1.0)
Monocytes Relative: 10.5 % (ref 3.0–12.0)
Neutro Abs: 3.8 10*3/uL (ref 1.4–7.7)
Neutrophils Relative %: 56.1 % (ref 43.0–77.0)
Platelets: 216 10*3/uL (ref 150.0–400.0)
RBC: 4.06 Mil/uL (ref 3.87–5.11)
RDW: 13.5 % (ref 11.5–15.5)
WBC: 6.7 10*3/uL (ref 4.0–10.5)

## 2019-08-03 LAB — COMPREHENSIVE METABOLIC PANEL WITH GFR
ALT: 13 U/L (ref 0–35)
AST: 17 U/L (ref 0–37)
Albumin: 4 g/dL (ref 3.5–5.2)
Alkaline Phosphatase: 79 U/L (ref 39–117)
BUN: 14 mg/dL (ref 6–23)
CO2: 33 meq/L — ABNORMAL HIGH (ref 19–32)
Calcium: 9.8 mg/dL (ref 8.4–10.5)
Chloride: 98 meq/L (ref 96–112)
Creatinine, Ser: 0.76 mg/dL (ref 0.40–1.20)
GFR: 90.57 mL/min
Glucose, Bld: 69 mg/dL — ABNORMAL LOW (ref 70–99)
Potassium: 3.5 meq/L (ref 3.5–5.1)
Sodium: 138 meq/L (ref 135–145)
Total Bilirubin: 0.5 mg/dL (ref 0.2–1.2)
Total Protein: 7.1 g/dL (ref 6.0–8.3)

## 2019-08-03 LAB — TSH: TSH: 1.22 u[IU]/mL (ref 0.35–4.50)

## 2019-08-03 NOTE — Progress Notes (Signed)
Subjective  CC:  Chief Complaint  Patient presents with  . Anxious    started about a week ago. concerned about post covid symptoms  . Fatigue    tired and jittery feeling   Same day acute visit; PCP not available.  Chart reviewed.   HPI: Terri Barajas is a 74 y.o. female who presents to the office today to address the problems listed above in the chief complaint.  dxd with covid on January 17; was evaluated in UC setting; had f/u telehealth visit with me on the 21st.  She received convalescent antibody infusion prior. Since she has tested negative and has improved overall. Presents today due to feeling jittery inside: feels like she will start shaking all over. She has not noted a tremor. She has no pain or palpitations. No f/c/s. Has mild lingering cough but no sob. No n/v/d or abdominal pain. No rash. No leg pain or swelling. Denies feeling anxious. Admits to persistent fatigue. Sleeping well. Denies stimulants or caffeine use.   Assessment  1. COVID-19   2. Jittery feeling   3. Cough      Plan   covid infection with lingering sxs:  Reassured. Normal exam today and nl vitals. No red flag sxs identified. Recommend trial of xanax (pt has at home for h/o situational anxiety), plenty of fluids and rest and more time. Will check labs to ensure stable.   Follow up: Return for needs a TOC with provider of her choice in this office.  Visit date not found  Orders Placed This Encounter  Procedures  . CBC with Differential/Platelet  . Comprehensive metabolic panel  . TSH   No orders of the defined types were placed in this encounter.     I reviewed the patients updated PMH, FH, and SocHx.    Patient Active Problem List   Diagnosis Date Noted  . OSA on CPAP 01/06/2019  . Abnormal CT of the abdomen 10/04/2018  . Radiculopathy affecting upper extremity, left 07/25/2018  . Hyperlipidemia 05/13/2018  . Situational anxiety 04/29/2018  . MVP (mitral valve prolapse) 04/29/2018   . Sleep-disordered breathing 04/29/2018  . Family History of Hyperthermia, malignant 04/23/2018  . Barrett's esophagus 03/01/2018  . Elevated lipase 02/18/2018  . Hiatal hernia 02/18/2018  . Constipation 02/18/2018  . Bloating 02/18/2018  . Aortic atherosclerosis ([place]) 01/23/2018  . GERD (gastroesophageal reflux disease)   . Glaucoma   . Hypertension    Current Meds  Medication Sig  . ALPRAZolam (XANAX) 0.5 MG tablet Take 0.5 tablets (0.25 mg total) by mouth daily as needed for anxiety.  Marland Kitchen aspirin 81 MG chewable tablet Chew 81 mg by mouth daily.  Marland Kitchen BIOTIN PO Take 1 tablet by mouth daily.   . Cholecalciferol (VITAMIN D3) 25 MCG (1000 UT) CAPS Take 1,000 Units by mouth daily.  Marland Kitchen DEXILANT 60 MG capsule TAKE 1 CAPSULE BY MOUTH  DAILY  . guaiFENesin-codeine 100-10 MG/5ML syrup Take 5 mLs by mouth every 6 (six) hours as needed for cough.  . hydrochlorothiazide (HYDRODIURIL) 25 MG tablet Take 1 tablet (25 mg total) by mouth daily.  Marland Kitchen latanoprost (XALATAN) 0.005 % ophthalmic solution Place 1 drop into both eyes at bedtime.   . Multiple Vitamins-Minerals (CENTRUM SILVER 50+WOMEN PO) Take 1 tablet by mouth daily.  . Omega-3 Fatty Acids (FISH OIL PO) Take 1 capsule by mouth daily.  . polycarbophil (FIBERCON) 625 MG tablet Take 625 mg by mouth daily.  . polyethylene glycol (MIRALAX / GLYCOLAX) packet Take  17 g by mouth daily.    Allergies: Patient is allergic to atracurium & derivatives; atropine; iodine; shellfish allergy; and statins. Family History: Patient family history includes Alcohol abuse in her father; Arthritis in her brother, sister, sister, and sister; Asthma in her sister; Colon polyps in her brother; Congenital heart disease in her brother; Diabetes in her brother; Glaucoma in her brother; Hyperlipidemia in her brother; Hypertension in her brother and sister; Pancreatic cancer in her mother; Prostate cancer in her brother. Social History:  Patient  reports that she has never  smoked. She has never used smokeless tobacco. She reports previous alcohol use. She reports previous drug use.  Review of Systems: Constitutional: Negative for fever malaise or anorexia Cardiovascular: negative for chest pain Respiratory: negative for SOB or persistent cough Gastrointestinal: negative for abdominal pain  Objective  Vitals: BP 124/80 (BP Location: Right Arm, Patient Position: Sitting, Cuff Size: Normal)   Pulse 95   Temp 98.6 F (37 C) (Temporal)   Ht 5\' 2"  (1.575 m)   Wt 149 lb 9.6 oz (67.9 kg)   LMP  (LMP Unknown)   SpO2 98%   BMI 27.36 kg/m  General: no acute distress , A&Ox3, appears well HEENT: PEERL, conjunctiva normal,neck is supple Cardiovascular:  RRR without murmur or gallop.  Respiratory:  Good breath sounds bilaterally, CTAB with normal respiratory effort Skin:  Warm, no rashes Neuro: no tremor Ext: no edema. No mm ttp     Commons side effects, risks, benefits, and alternatives for medications and treatment plan prescribed today were discussed, and the patient expressed understanding of the given instructions. Patient is instructed to call or message via MyChart if he/she has any questions or concerns regarding our treatment plan. No barriers to understanding were identified. We discussed Red Flag symptoms and signs in detail. Patient expressed understanding regarding what to do in case of urgent or emergency type symptoms.   Medication list was reconciled, printed and provided to the patient in AVS. Patient instructions and summary information was reviewed with the patient as documented in the AVS. This note was prepared with assistance of Dragon voice recognition software. Occasional wrong-word or sound-a-like substitutions may have occurred due to the inherent limitations of voice recognition software  This visit occurred during the SARS-CoV-2 public health emergency.  Safety protocols were in place, including screening questions prior to the visit,  additional usage of staff PPE, and extensive cleaning of exam room while observing appropriate contact time as indicated for disinfecting solutions.

## 2019-08-03 NOTE — Patient Instructions (Addendum)
Please schedule a transfer of care appointment with a provider in this office.   I will release your lab results to you on your MyChart account with further instructions. Please reply with any questions.   You look good! Rest, try taking a xanax to see if that improves your symptoms and give it some more time.

## 2019-08-04 NOTE — Telephone Encounter (Signed)
Ms.Neuwirth calls in very confused about these messages, and asked for a returned call.

## 2019-08-04 NOTE — Telephone Encounter (Signed)
Spoke with patient. Terri Barajas states that she ate cheerios with fat free milk prior to coming to yesterday's visit.

## 2019-08-11 ENCOUNTER — Telehealth: Payer: Self-pay | Admitting: Podiatry

## 2019-08-11 NOTE — Telephone Encounter (Signed)
DOS: 08/26/2019  SURGICAL PROCEDURE: Endoscopic Plantar Fasciotomy [phone]).  [employer] Medicare Effective 06/18/2019 -  Out of Pocket: $4,500 with $70.00 met and $4,430 remains. CoInsurance:100% / 0%  $225 Facility fee co-pay.  Notification or Prior Authorization is not required for the requested services  This [employer] Medicare Advantage members plan does not currently require a prior authorization for these services. If you have general questions about the prior authorization requirements, please call us at [phone] or visit [email or web address] > Clinician Resources > Advance and Admission Notification Requirements. The number above acknowledges your notification. Please write this number down for future reference. Notification is not a guarantee of coverage or payment.  Decision ID #:[id number]  The number above acknowledges your inquiry and our response. Please write this number down and refer to it for future inquiries. Coverage and payment for an item or service is governed by the member's benefit plan document, and, if applicable, the provider's participation agreement with the Health Plan.

## 2019-08-16 ENCOUNTER — Ambulatory Visit: Payer: Medicare Other | Admitting: Cardiovascular Disease

## 2019-08-16 ENCOUNTER — Other Ambulatory Visit: Payer: Self-pay

## 2019-08-16 VITALS — BP 119/78 | HR 104 | Ht 63.0 in | Wt 150.0 lb

## 2019-08-16 DIAGNOSIS — I700 Atherosclerosis of aorta: Secondary | ICD-10-CM

## 2019-08-16 DIAGNOSIS — G4733 Obstructive sleep apnea (adult) (pediatric): Secondary | ICD-10-CM | POA: Diagnosis not present

## 2019-08-16 DIAGNOSIS — I10 Essential (primary) hypertension: Secondary | ICD-10-CM | POA: Diagnosis not present

## 2019-08-16 DIAGNOSIS — Z9989 Dependence on other enabling machines and devices: Secondary | ICD-10-CM

## 2019-08-16 DIAGNOSIS — E7800 Pure hypercholesterolemia, unspecified: Secondary | ICD-10-CM

## 2019-08-16 NOTE — Progress Notes (Signed)
Cardiology Office Note   Date:  08/16/2019   ID:  Terri Barajas, Terri Barajas 10-20-1945, MRN [id number]  PCP:  Patient, No Pcp Per  Cardiologist:   Skeet Latch, MD   No chief complaint on file.    History of Present Illness: Terri Barajas is a 74 y.o. female with hypertension, hyperlipidemia, Barrett's esophagus here for follow up.  She was initially seen 06/2018 for the evaluation ofabnormal EKG.Terri Barajas a long history of mitral valve prolapse and was seeing a cardiologist in [place] before moving to [place]. She has had this diagnosis for at least 10 years. She also has been treated with nadolol for palpitations. She does not recall being told she had any specific arrhythmia diagnosis. The nadolol has controlled it well. According to her outside records she had an echo 09/2017 that revealed LVEF 60 to 65% with trivial mitral regurgitation and mild tricuspid regurgitation.No mitral valve prolapse was noted on this echo. She reports having a stress test in 2019 that was performed to follow-up her mitral valve disease but she does not recall having any symptoms of chest pain or shortness of breath at the time. She had an EKG in January 2020 that revealed anterior T wave inversions.At the time she was being seen in the ED for epigastric and arm pain. She has a longstanding history of intermittent abdominal distention and bloating. She is has undergone upper endoscopies and biopsies that revealed Barrett's esophagus gastritis. On that particular day her epigastric discomfort was associated with left arm pain. Cardiac enzymes were negative. She was instructed to follow-up with cardiology as an outpatient. Terri Barajas was referred for a cardiac CT-A but it could not be performed due to tachycardia.  Instead she had a [employer] 08/2018 that revealed LVEF 64% and no ischemia.  At her last appointment she was not taking her diuretic because she did not  like how it made her go to the bathroom so often.  Therefore hydrochlorothiazide was switched to amlodipine, as her blood pressure was also elevated.  She followed up with our pharmacist 05/2019 and her blood pressure was well have been controlled.  She switched back to HCTZ because she didn't like how amlodipine made her feel.  She started taking the hydrochlorothiazide daily and no longer has issues with frequent urination.  The morning nadolol was switched to metoprolol due to insurance reasons.  She had headache so this was changed to diltiazem.  Since her last appointment she became positive for COVID-19.  She and her husband were both treated at the outpatient Remdesivir infusion center.  She continues to have a lingering cough but otherwise feels well.  She notices that her heart rate has been elevated on her Fitbit.  He goes up to 114 sometimes.  Every day she has a feeling of anxiousness.  There is no associated chest pain, shortness of breath, lightheadedness, or dizziness.    Past Medical History:  Diagnosis Date  . Anxiety   . Arthritis   . Complication of anesthesia    some type of sedation medication made her feel crazy  . Dysrhythmia   . Family history of adverse reaction to anesthesia    daughter has Malignant Hyperthermia hx -   . GERD (gastroesophageal reflux disease)   . Glaucoma   . H/O: hysterectomy 1978  . Heart disease   . History of hiatal hernia   . Hypertension   . MVP (mitral valve prolapse)   . Panic attacks   .  Pre-diabetes     Past Surgical History:  Procedure Laterality Date  . APPENDECTOMY  1972  . BIOPSY  07/06/2018   Procedure: BIOPSY;  Surgeon: Rush Landmark Telford Nab., MD;  Location: [facility];  Service: Gastroenterology;;  . CATARACT EXTRACTION  2013  . [facility]  . COLONOSCOPY    . ESOPHAGOGASTRODUODENOSCOPY (EGD) WITH PROPOFOL N/A 07/06/2018   Procedure: ESOPHAGOGASTRODUODENOSCOPY (EGD) WITH PROPOFOL;  Surgeon: Rush Landmark  Telford Nab., MD;  Location: [facility];  Service: Gastroenterology;  Laterality: N/A;  . HAND SURGERY Bilateral   . HEMORRHOID SURGERY  2018  . S/P Hysterectomy   1987     Current Outpatient Medications  Medication Sig Dispense Refill  . ALPRAZolam (XANAX) 0.5 MG tablet Take 0.5 tablets (0.25 mg total) by mouth daily as needed for anxiety. 45 tablet 0  . aspirin 81 MG chewable tablet Chew 81 mg by mouth daily.    Marland Kitchen BIOTIN PO Take 1 tablet by mouth daily.     . Cholecalciferol (VITAMIN D3) 25 MCG (1000 UT) CAPS Take 1,000 Units by mouth daily.    Marland Kitchen DEXILANT 60 MG capsule TAKE 1 CAPSULE BY MOUTH  DAILY 90 capsule 3  . diltiazem (CARDIZEM) 30 MG tablet Take 30 mg by mouth 2 (two) times daily.    Marland Kitchen guaiFENesin-codeine 100-10 MG/5ML syrup Take 5 mLs by mouth every 6 (six) hours as needed for cough. 120 mL 0  . hydrochlorothiazide (HYDRODIURIL) 25 MG tablet Take 1 tablet (25 mg total) by mouth daily. 90 tablet 3  . latanoprost (XALATAN) 0.005 % ophthalmic solution Place 1 drop into both eyes at bedtime.     . Multiple Vitamins-Minerals (CENTRUM SILVER 50+WOMEN PO) Take 1 tablet by mouth daily.    . Omega-3 Fatty Acids (FISH OIL PO) Take 1 capsule by mouth daily.    . polycarbophil (FIBERCON) 625 MG tablet Take 625 mg by mouth daily.    . polyethylene glycol (MIRALAX / GLYCOLAX) packet Take 17 g by mouth daily.     No current facility-administered medications for this visit.    Allergies:   Atracurium & derivatives, Atropine, Iodine, Shellfish allergy, and Statins    Social History:  The patient  reports that she has never smoked. She has never used smokeless tobacco. She reports previous alcohol use. She reports previous drug use.   Family History:  [name] family history includes Alcohol abuse in her father; Arthritis in her brother, sister, sister, and sister; Asthma in her sister; Colon polyps in her brother; Congenital heart disease in her brother; Diabetes in her brother;  Glaucoma in her brother; Hyperlipidemia in her brother; Hypertension in her brother and sister; Pancreatic cancer in her mother; Prostate cancer in her brother.    ROS:  Please see the history of present illness.   Otherwise, review of systems are positive for none.   All other systems are reviewed and negative.    PHYSICAL EXAM: VS:  BP 119/78   Pulse (!) 104   Ht 5\' 3"  (1.6 m)   Wt 150 lb (68 kg)   LMP  (LMP Unknown)   SpO2 97%   BMI 26.57 kg/m  , BMI Body mass index is 26.57 kg/m. GENERAL:  Well appearing HEENT: Pupils equal round and reactive, fundi not visualized, oral mucosa unremarkable NECK:  No jugular venous distention, waveform within normal limits, carotid upstroke brisk and symmetric, no bruits LUNGS:  Clear to auscultation bilaterally HEART:  RRR.  PMI not displaced or sustained,S1  and S2 within normal limits, no S3, no S4, no clicks, no rubs, no murmurs ABD:  Flat, positive bowel sounds normal in frequency in pitch, no bruits, no rebound, no guarding, no midline pulsatile mass, no hepatomegaly, no splenomegaly EXT:  2 plus pulses throughout, no edema, no cyanosis no clubbing SKIN:  No rashes no nodules NEURO:  Cranial nerves II through XII grossly intact, motor grossly intact throughout PSYCH:  Cognitively intact, oriented to person place and time   EKG:  EKG is not ordered today. The ekg ordered 07/13/18 demonstrates sinus rhythm.  Rate 70 bpm.  Non-specific ST changes.  04/15/19: Sinus rate 79 bpm.  Nonspecific ST changes.    Recent Labs: 08/03/2019: ALT 13; BUN 14; Creatinine, Ser 0.76; Hemoglobin 12.0; Platelets 216.0; Potassium 3.5; Sodium 138; TSH 1.22    Lipid Panel    Component Value Date/Time   CHOL 270 (H) 05/27/2018 1045   TRIG 191.0 (H) 05/27/2018 1045   HDL 54.70 05/27/2018 1045   CHOLHDL 5 05/27/2018 1045   VLDL 38.2 05/27/2018 1045   LDLCALC 177 (H) 05/27/2018 1045      Wt Readings from Last 3 Encounters:  08/16/19 150 lb (68 kg)    08/03/19 149 lb 9.6 oz (67.9 kg)  07/12/19 158 lb (71.7 kg)      ASSESSMENT AND PLAN:  # Atypical chest pain: # Hyperlipidemia: Symptoms were very atypical for ischemia and more consistent with GERD.  She has no exertional symptoms and reportedly had a normal stress test.  She was referred for a coronary CT-A but never had it.  Currently asymptomatic.  Continue aspirin.  # Hypertension: Blood pressure well-controlled.  # Sinus tachycardia:  Terri Barajas is having episodes of anxiousness, suspect may be related to tachycardia.  She is no longer on nadolol due to insurance lack of approval.  She did not tolerate metoprolol.  Diltiazem was ordered but she has not actually started taking it.  She is going to start taking it twice daily.  She will track her heart rates at home and bring to follow-up.  Thyroid testing, blood counts, and electrolytes were within normal limits last month.  # Mitral valve prolapse: There is no mention of mitral valve prolapse on her last 2 echocardiograms.  She had only trivial mitral irritation.  There is no evidence of heart failure on exam.  No need to repeat echocardiogram at this time.  Nadolol was switched to metoprolol and now diltiazem.    Current medicines are reviewed at length with the patient today.  The patient does not have concerns regarding medicines.  The following changes have been made:  no change  Labs/ tests ordered today include:   No orders of the defined types were placed in this encounter.    Disposition:   FU with Soliana Kitko C. Oval Linsey, MD, [facility] in 2 months.      Signed, Terri Dorrough C. Oval Linsey, MD, [facility]  08/16/2019 12:18 PM    [place] less than a month away now she has obvious passport [place] female

## 2019-08-16 NOTE — Patient Instructions (Signed)
Medication Instructions:  START DILTIAZEM 30 MG TWICE A DAY   *If you need a refill on your cardiac medications before your next appointment, please call your pharmacy  Lab Work: NONE   Testing/Procedures: NONE   Follow-Up: At [facility], you and your health needs are our priority.  As part of our continuing mission to provide you with exceptional heart care, we have created designated Provider Care Teams.  These Care Teams include your primary Cardiologist (physician) and Advanced Practice Providers (APPs -  Physician Assistants and Nurse Practitioners) who all work together to provide you with the care you need, when you need it.  We recommend signing up for the patient portal called "MyChart".  Sign up information is provided on this After Visit Summary.  MyChart is used to connect with patients for Virtual Visits (Telemedicine).  Patients are able to view lab/test results, encounter notes, upcoming appointments, etc.  Non-urgent messages can be sent to your provider as well.   To learn more about what you can do with MyChart, go to [email or web address].    Your next appointment:   2 month(s)  The format for your next appointment:   In Person  Provider:   You may see Skeet Latch, MD . or one of the following Advanced Practice Providers on your designated Care Team:    Kerin Ransom, PA-C  [place], [place]  Coletta Memos, [place]  Other Instructions  MONITOR AND [place]. BRING READINGS TO YOUR FOLLOW UP VISIT

## 2019-08-18 ENCOUNTER — Telehealth: Payer: Self-pay | Admitting: Cardiovascular Disease

## 2019-08-18 MED ORDER — DILTIAZEM HCL 30 MG PO TABS: 30.0000 mg | ORAL_TABLET | Freq: Two times a day (BID) | ORAL | 3 refills | Status: DC

## 2019-08-18 NOTE — Telephone Encounter (Signed)
Advised patient, she never started the Carvedilol either. She will try the Diltiazem 30 mg twice a day. Rx sent to pharmacy

## 2019-08-18 NOTE — Telephone Encounter (Signed)
Pt c/o medication issue:  1. Name of Medication:  diltiazem (CARDIZEM) 30 MG tablet carvedilol (COREG) 3.125 MG tablet   2. How are you currently taking this medication (dosage and times per day)? Pt has not started taking either yet  3. Are you having a reaction (difficulty breathing--STAT)? No  4. What is your medication issue?  PT has not started taking either of these medications yet. She thought she had the diltiazem (CARDIZEM) 30 MG tablet on hand but she does not. She has not filled that rx yet.   She does have the carvedilol on hand but has not started taking it yet. She just wants to know which medication she needs to take.  She sent a mychart message but has not heard a response yet and was just following up.

## 2019-08-26 ENCOUNTER — Other Ambulatory Visit: Payer: Self-pay | Admitting: Podiatry

## 2019-08-26 ENCOUNTER — Encounter: Payer: Self-pay | Admitting: Podiatry

## 2019-08-26 DIAGNOSIS — M722 Plantar fascial fibromatosis: Secondary | ICD-10-CM | POA: Diagnosis not present

## 2019-08-26 DIAGNOSIS — M25571 Pain in right ankle and joints of right foot: Secondary | ICD-10-CM | POA: Diagnosis not present

## 2019-08-26 DIAGNOSIS — E7800 Pure hypercholesterolemia, unspecified: Secondary | ICD-10-CM | POA: Diagnosis not present

## 2019-08-26 MED ORDER — OXYCODONE-ACETAMINOPHEN 5-325 MG PO TABS: 1.0000 | ORAL_TABLET | Freq: Four times a day (QID) | ORAL | 0 refills | Status: DC | PRN

## 2019-08-26 NOTE — Progress Notes (Signed)
PRN postop 

## 2019-08-30 ENCOUNTER — Ambulatory Visit (INDEPENDENT_AMBULATORY_CARE_PROVIDER_SITE_OTHER): Payer: Medicare Other | Admitting: Podiatry

## 2019-08-30 ENCOUNTER — Other Ambulatory Visit: Payer: Self-pay

## 2019-08-30 DIAGNOSIS — Z9889 Other specified postprocedural states: Secondary | ICD-10-CM

## 2019-08-30 DIAGNOSIS — M722 Plantar fascial fibromatosis: Secondary | ICD-10-CM

## 2019-09-01 ENCOUNTER — Encounter: Payer: Medicare Other | Admitting: Podiatry

## 2019-09-01 NOTE — Progress Notes (Signed)
   Subjective:  Patient presents today status post EPF right. DOS: 08/26/2019. She reports some soreness of the foot. She has been elevating and icing the foot for treatment. She continues to use the CAM boot as directed. There are no worsening factors noted. Patient is here for further evaluation and treatment.    Past Medical History:  Diagnosis Date  . Anxiety   . Arthritis   . Complication of anesthesia    some type of sedation medication made her feel crazy  . Dysrhythmia   . Family history of adverse reaction to anesthesia    daughter has Malignant Hyperthermia hx -   . GERD (gastroesophageal reflux disease)   . Glaucoma   . H/O: hysterectomy 1978  . Heart disease   . History of hiatal hernia   . Hypertension   . MVP (mitral valve prolapse)   . Panic attacks   . Pre-diabetes       Objective/Physical Exam Neurovascular status intact.  Skin incisions appear to be well coapted with sutures and staples intact. No sign of infectious process noted. No dehiscence. No active bleeding noted. Moderate edema noted to the surgical extremity.  Assessment: 1. s/p EPF right. DOS: 08/26/2019   Plan of Care:  1. Patient was evaluated.  2. Dressing changed.  3. Continue weightbearing in CAM boot.  4. Return to clinic in one week for suture removal.    Edrick Kins, DPM [facility]  Dr. Edrick Kins, [address]                                        [place], [place] [address]                Office [phone]  Fax [phone]

## 2019-09-13 ENCOUNTER — Other Ambulatory Visit: Payer: Self-pay

## 2019-09-13 ENCOUNTER — Ambulatory Visit (INDEPENDENT_AMBULATORY_CARE_PROVIDER_SITE_OTHER): Payer: Medicare Other | Admitting: Podiatry

## 2019-09-13 DIAGNOSIS — Z9889 Other specified postprocedural states: Secondary | ICD-10-CM

## 2019-09-13 DIAGNOSIS — M722 Plantar fascial fibromatosis: Secondary | ICD-10-CM

## 2019-09-15 NOTE — Progress Notes (Signed)
   Subjective:  Patient presents today status post EPF right. DOS: 08/26/2019. She reports some intermittent pain that is normally relieved by taking Tylenol. She does reports having to take half a Percocet for treatment at one point. She has been using the CAM boot as directed. There are no specific worsening factors noted. Patient is here for further evaluation and treatment.    Past Medical History:  Diagnosis Date  . Anxiety   . Arthritis   . Complication of anesthesia    some type of sedation medication made her feel crazy  . Dysrhythmia   . Family history of adverse reaction to anesthesia    daughter has Malignant Hyperthermia hx -   . GERD (gastroesophageal reflux disease)   . Glaucoma   . H/O: hysterectomy 1978  . Heart disease   . History of hiatal hernia   . Hypertension   . MVP (mitral valve prolapse)   . Panic attacks   . Pre-diabetes       Objective/Physical Exam Neurovascular status intact.  Skin incisions appear to be well coapted with sutures and staples intact. No sign of infectious process noted. No dehiscence. No active bleeding noted. Moderate edema noted to the surgical extremity.  Assessment: 1. s/p EPF right. DOS: 08/26/2019   Plan of Care:  1. Patient was evaluated.  2. Sutures removed.  3. Transition out of CAM boot.  4. Recommended good shoe gear.  5. Return to clinic in 4 weeks.    Edrick Kins, DPM [facility]  Dr. Edrick Kins, [address]                                        [place], [place] [address]                Office [phone]  Fax [phone]

## 2019-09-16 DIAGNOSIS — G4733 Obstructive sleep apnea (adult) (pediatric): Secondary | ICD-10-CM | POA: Diagnosis not present

## 2019-09-17 ENCOUNTER — Other Ambulatory Visit: Payer: Self-pay

## 2019-09-17 MED ORDER — DILTIAZEM HCL 30 MG PO TABS: 30.0000 mg | ORAL_TABLET | Freq: Two times a day (BID) | ORAL | 3 refills | Status: DC

## 2019-09-22 ENCOUNTER — Telehealth: Payer: Self-pay | Admitting: *Deleted

## 2019-09-22 NOTE — Telephone Encounter (Signed)
Patient called today and stated that she had seen Dr Amalia Hailey back in late March of this year and had EPF on March the 11th of this year and stated that the toes are swelling and I think I have a bunion and I can not wear the air fracture walker and the bones feel sore and the foot aches and hurts with walking and the foot feels better with out wearing the boot and the ace wrap and the anklet feels tight around my toes and I can not wear athletic shoes and I stated to the patient I would ask Dr Amalia Hailey if the patient could have a surgical shoe and per Dr Amalia Hailey the patient can come by and get the shoe and see if that helps and the patient will be coming by today. Lattie Haw

## 2019-09-28 ENCOUNTER — Other Ambulatory Visit: Payer: Self-pay

## 2019-09-28 ENCOUNTER — Emergency Department (HOSPITAL_COMMUNITY)
Admission: EM | Admit: 2019-09-28 | Discharge: 2019-09-29 | Disposition: A | Discharge status: Home / Self Care | Payer: Medicare Other | Attending: Emergency Medicine | Admitting: Emergency Medicine

## 2019-09-28 ENCOUNTER — Encounter (HOSPITAL_COMMUNITY): Payer: Self-pay | Admitting: Emergency Medicine

## 2019-09-28 DIAGNOSIS — Z7982 Long term (current) use of aspirin: Secondary | ICD-10-CM | POA: Diagnosis not present

## 2019-09-28 DIAGNOSIS — W260XXA Contact with knife, initial encounter: Secondary | ICD-10-CM | POA: Insufficient documentation

## 2019-09-28 DIAGNOSIS — Z79899 Other long term (current) drug therapy: Secondary | ICD-10-CM | POA: Insufficient documentation

## 2019-09-28 DIAGNOSIS — Y999 Unspecified external cause status: Secondary | ICD-10-CM | POA: Diagnosis not present

## 2019-09-28 DIAGNOSIS — S61012A Laceration without foreign body of left thumb without damage to nail, initial encounter: Secondary | ICD-10-CM | POA: Diagnosis not present

## 2019-09-28 DIAGNOSIS — Y929 Unspecified place or not applicable: Secondary | ICD-10-CM | POA: Insufficient documentation

## 2019-09-28 DIAGNOSIS — I10 Essential (primary) hypertension: Secondary | ICD-10-CM | POA: Insufficient documentation

## 2019-09-28 DIAGNOSIS — Y93G1 Activity, food preparation and clean up: Secondary | ICD-10-CM | POA: Insufficient documentation

## 2019-09-28 NOTE — ED Triage Notes (Signed)
Patient presents with approx. 1/4" skin laceration at left hand sustained from a knife this evening , patient stated a knife accidentally fell and hit her left hand, dressing applied at triage /minimal bleeding .

## 2019-09-29 NOTE — Discharge Instructions (Signed)
Dermabond will stay on for a few days and start to flake off.  Try not to pull it off. Can shower/bathe as normal.  Can cover with bandage if you like but you do not have to. Can follow-up with your primary care doctor if any ongoing issues. Return here for new concerns.

## 2019-09-29 NOTE — ED Provider Notes (Signed)
[facility] EMERGENCY DEPARTMENT Provider Note   CSN: [id number] Arrival date & time: 09/28/19  2018     History Chief Complaint  Patient presents with  . Laceration    Terri Barajas is a 74 y.o. female.  The history is provided by the patient and medical records.  Laceration   74 y.o. F with hx of anxiety, arthritis, GERD, presenting to the ED for small laceration of left thumb. Patient states she was cutting up some pork loin and the knife slipped and tip of it jabbed into left thumb.  Wound is small, about 0.25 inch.  No bleeding.  Denies pain at the site.  She is not on anticoagulation.  Tetanus UTD.  Past Medical History:  Diagnosis Date  . Anxiety   . Arthritis   . Complication of anesthesia    some type of sedation medication made her feel crazy  . Dysrhythmia   . Family history of adverse reaction to anesthesia    daughter has Malignant Hyperthermia hx -   . GERD (gastroesophageal reflux disease)   . Glaucoma   . H/O: hysterectomy 1978  . Heart disease   . History of hiatal hernia   . Hypertension   . MVP (mitral valve prolapse)   . Panic attacks   . Pre-diabetes     Patient Active Problem List   Diagnosis Date Noted  . OSA on CPAP 01/06/2019  . Abnormal CT of the abdomen 10/04/2018  . Radiculopathy affecting upper extremity, left 07/25/2018  . Hyperlipidemia 05/13/2018  . Situational anxiety 04/29/2018  . MVP (mitral valve prolapse) 04/29/2018  . Sleep-disordered breathing 04/29/2018  . Family History of Hyperthermia, malignant 04/23/2018  . Barrett's esophagus 03/01/2018  . Elevated lipase 02/18/2018  . Hiatal hernia 02/18/2018  . Constipation 02/18/2018  . Bloating 02/18/2018  . Aortic atherosclerosis ([place]) 01/23/2018  . GERD (gastroesophageal reflux disease)   . Glaucoma   . Hypertension     Past Surgical History:  Procedure Laterality Date  . APPENDECTOMY  1972  . BIOPSY  07/06/2018   Procedure: BIOPSY;  Surgeon:  Rush Landmark Telford Nab., MD;  Location: [place];  Service: Gastroenterology;;  . CATARACT EXTRACTION  2013  . [place]  . COLONOSCOPY    . ESOPHAGOGASTRODUODENOSCOPY (EGD) WITH PROPOFOL N/A 07/06/2018   Procedure: ESOPHAGOGASTRODUODENOSCOPY (EGD) WITH PROPOFOL;  Surgeon: Rush Landmark Telford Nab., MD;  Location: [place];  Service: Gastroenterology;  Laterality: N/A;  . HAND SURGERY Bilateral   . HEMORRHOID SURGERY  2018  . S/P Hysterectomy   1987     OB History   No obstetric history on file.     Family History  Problem Relation Age of Onset  . Pancreatic cancer Mother   . Alcohol abuse Father   . Asthma Sister   . Hypertension Sister   . Arthritis Sister   . Diabetes Brother   . Arthritis Brother   . Hyperlipidemia Brother   . Hypertension Brother   . Glaucoma Brother   . Colon polyps Brother   . Arthritis Sister   . Arthritis Sister   . Prostate cancer Brother   . Congenital heart disease Brother   . Colon cancer Neg Hx   . Esophageal cancer Neg Hx   . Inflammatory bowel disease Neg Hx   . Liver disease Neg Hx   . Rectal cancer Neg Hx   . Stomach cancer Neg Hx     Social History   Tobacco Use  .  Smoking status: Never Smoker  . Smokeless tobacco: Never Used  Substance Use Topics  . Alcohol use: Not Currently  . Drug use: Not Currently    Home Medications Prior to Admission medications   Medication Sig Start Date End Date Taking? Authorizing Provider  ALPRAZolam Duanne Moron) 0.5 MG tablet Take 0.5 tablets (0.25 mg total) by mouth daily as needed for anxiety. 04/01/19   Briscoe Deutscher, DO  aspirin 81 MG chewable tablet Chew 81 mg by mouth daily.    [provider]  BIOTIN PO Take 1 tablet by mouth daily.     [provider]  Cholecalciferol (VITAMIN D3) 25 MCG (1000 UT) CAPS Take 1,000 Units by mouth daily.    [provider]  DEXILANT 60 MG capsule TAKE 1 CAPSULE BY MOUTH  DAILY 07/29/19   Mansouraty, Telford Nab., MD  diltiazem (CARDIZEM) 30 MG tablet Take 1 tablet (30 mg total) by mouth 2 (two) times daily. 09/17/19   Skeet Latch, MD  guaiFENesin-codeine 100-10 MG/5ML syrup Take 5 mLs by mouth every 6 (six) hours as needed for cough. 07/12/19   Leamon Arnt, MD  hydrochlorothiazide (HYDRODIURIL) 25 MG tablet Take 1 tablet (25 mg total) by mouth daily. 05/31/19 08/29/19  Skeet Latch, MD  latanoprost (XALATAN) 0.005 % ophthalmic solution Place 1 drop into both eyes at bedtime.     [provider]  Multiple Vitamins-Minerals (CENTRUM SILVER 50+WOMEN PO) Take 1 tablet by mouth daily.    [provider]  Omega-3 Fatty Acids (FISH OIL PO) Take 1 capsule by mouth daily.    [provider]  oxyCODONE-acetaminophen (PERCOCET) 5-325 MG tablet Take 1 tablet by mouth every 6 (six) hours as needed for severe pain. 08/26/19   Edrick Kins, DPM  polycarbophil (FIBERCON) 625 MG tablet Take 625 mg by mouth daily.    [provider]  polyethylene glycol (MIRALAX / GLYCOLAX) packet Take 17 g by mouth daily.    [provider]    Allergies    Atracurium & derivatives, Atropine, Iodine, Shellfish allergy, and Statins  Review of Systems   Review of Systems  Skin: Positive for wound.  All other systems reviewed and are negative.   Physical Exam Updated Vital Signs BP 140/83 (BP Location: Right Arm)   Pulse (!) 120   Temp 99.7 F (37.6 C) (Oral)   Resp 20   Ht 5\' 2"  (1.575 m)   Wt 70 kg   LMP  (LMP Unknown)   SpO2 100%   BMI 28.23 kg/m   Physical Exam Vitals and nursing note reviewed.  Constitutional:      Appearance: She is well-developed.  HENT:     Head: Normocephalic and atraumatic.  Eyes:     Conjunctiva/sclera: Conjunctivae normal.     Pupils: Pupils are equal, round, and reactive to light.  Cardiovascular:     Rate and Rhythm: Normal rate and regular rhythm.     Heart sounds: Normal heart sounds.  Pulmonary:     Effort: Pulmonary  effort is normal.     Breath sounds: Normal breath sounds. No stridor. No wheezing.  Abdominal:     General: Bowel sounds are normal.     Palpations: Abdomen is soft.  Musculoskeletal:        General: Normal range of motion.     Cervical back: Normal range of motion.     Comments: Left thumb with 0.25in laceration along the proximal phalanx of dorsal aspect, no active bleeding, wound is  very superficial, no deep tissue, vessel, or tendon involvement; full ROM; normal distal sensation and cap refill  Skin:    General: Skin is warm and dry.  Neurological:     Mental Status: She is alert and oriented to person, place, and time.     ED Results / Procedures / Treatments   Labs (all labs ordered are listed, but only abnormal results are displayed) Labs Reviewed - No data to display  EKG None  Radiology No results found.  Procedures Procedures (including critical care time)  LACERATION REPAIR Performed by: Larene Pickett Authorized by: Larene Pickett Consent: Verbal consent obtained. Risks and benefits: risks, benefits and alternatives were discussed Consent given by: patient Patient identity confirmed: provided demographic data Prepped and Draped in normal sterile fashion Wound explored  Laceration Location: left thumb  Laceration Length: 0.25in  No Foreign Bodies seen or palpated  Anesthesia: none  Local anesthetic: none  Anesthetic total: 0 ml  Irrigation method: syringe Amount of cleaning: standard  Skin closure: dermabond  Number of sutures: 0  Technique: n/a  Patient tolerance: Patient tolerated the procedure well with no immediate complications.   Medications Ordered in ED Medications - No data to display  ED Course  I have reviewed the triage vital signs and the nursing notes.  Pertinent labs & imaging results that were available during my care of the patient were reviewed by me and considered in my medical decision making (see chart for  details).    MDM Rules/Calculators/A&P  74 y.o. F here with very minor, 0.25in superficial laceration of left proximal thumb from a knife while cooking.  She has no deep tissue, vessel, or tendon involvement, maintains full ROM of the thumb, wound is hemostatic.  Tetanus UTD.  Wound cleansed.  Patient requested repair with dermabond which seems very appropriate given superficial/minor nature of the wound.  Discussed home wound care instructions.  Can follow-up with PCP for any ongoing issues.  Return here as needed with new concerns.  Final Clinical Impression(s) / ED Diagnoses Final diagnoses:  Laceration of left thumb without foreign body without damage to nail, initial encounter    Rx / DC Orders ED Discharge Orders    None       Larene Pickett, PA-C 09/29/19 0511    Ward, Delice Bison, DO 09/29/19 0532

## 2019-10-08 ENCOUNTER — Telehealth: Payer: Self-pay | Admitting: *Deleted

## 2019-10-08 NOTE — Telephone Encounter (Signed)
Patients husband is enrolled in PREP program and she would like to get enrolled if Dr Oval Linsey feels appropriate  Discussed with Dr Oval Linsey and ok to get patient enrolled. Mychart message sent to patient and message sent to [facility] RN to arrange

## 2019-10-13 ENCOUNTER — Other Ambulatory Visit: Payer: Self-pay

## 2019-10-13 ENCOUNTER — Ambulatory Visit (INDEPENDENT_AMBULATORY_CARE_PROVIDER_SITE_OTHER): Payer: Medicare Other | Admitting: Podiatry

## 2019-10-13 DIAGNOSIS — Z9889 Other specified postprocedural states: Secondary | ICD-10-CM

## 2019-10-13 DIAGNOSIS — M722 Plantar fascial fibromatosis: Secondary | ICD-10-CM

## 2019-10-15 NOTE — Progress Notes (Signed)
   Subjective:  Patient presents today status post EPF right. DOS: 08/26/2019. She reports come intermittent aching and sharp pain, medial aspect worse than lateral. She reports some associated intermittent throbbing pain. She has been soaking the feet in Epsom salt, icing, elevating and taking Aleve for treatment. Patient is here for further evaluation and treatment.    Past Medical History:  Diagnosis Date  . Anxiety   . Arthritis   . Complication of anesthesia    some type of sedation medication made her feel crazy  . Dysrhythmia   . Family history of adverse reaction to anesthesia    daughter has Malignant Hyperthermia hx -   . GERD (gastroesophageal reflux disease)   . Glaucoma   . H/O: hysterectomy 1978  . Heart disease   . History of hiatal hernia   . Hypertension   . MVP (mitral valve prolapse)   . Panic attacks   . Pre-diabetes       Objective/Physical Exam Neurovascular status intact.  Skin incisions appear to be well coapted. No sign of infectious process noted. No dehiscence. No active bleeding noted. Moderate edema noted to the surgical extremity.  Assessment: 1. s/p EPF right. DOS: 08/26/2019   Plan of Care:  1. Patient was evaluated.  2. Discontinue using CAM boot.  3. Physical therapy at [facility] ordered.  4. Return to clinic in 6 weeks.     Edrick Kins, DPM [facility]  Dr. Edrick Kins, [address]                                        [place], [place] [address]                Office [phone]  Fax [phone]

## 2019-10-19 DIAGNOSIS — R262 Difficulty in walking, not elsewhere classified: Secondary | ICD-10-CM | POA: Diagnosis not present

## 2019-10-19 DIAGNOSIS — M79671 Pain in right foot: Secondary | ICD-10-CM | POA: Diagnosis not present

## 2019-10-19 DIAGNOSIS — M6281 Muscle weakness (generalized): Secondary | ICD-10-CM | POA: Diagnosis not present

## 2019-10-19 DIAGNOSIS — M722 Plantar fascial fibromatosis: Secondary | ICD-10-CM | POA: Diagnosis not present

## 2019-10-19 DIAGNOSIS — M25571 Pain in right ankle and joints of right foot: Secondary | ICD-10-CM | POA: Diagnosis not present

## 2019-10-26 DIAGNOSIS — M722 Plantar fascial fibromatosis: Secondary | ICD-10-CM | POA: Diagnosis not present

## 2019-10-26 DIAGNOSIS — R262 Difficulty in walking, not elsewhere classified: Secondary | ICD-10-CM | POA: Diagnosis not present

## 2019-10-26 DIAGNOSIS — M79671 Pain in right foot: Secondary | ICD-10-CM | POA: Diagnosis not present

## 2019-10-26 DIAGNOSIS — M25571 Pain in right ankle and joints of right foot: Secondary | ICD-10-CM | POA: Diagnosis not present

## 2019-10-26 DIAGNOSIS — M6281 Muscle weakness (generalized): Secondary | ICD-10-CM | POA: Diagnosis not present

## 2019-10-27 DIAGNOSIS — H401132 Primary open-angle glaucoma, bilateral, moderate stage: Secondary | ICD-10-CM | POA: Diagnosis not present

## 2019-11-01 DIAGNOSIS — M25571 Pain in right ankle and joints of right foot: Secondary | ICD-10-CM | POA: Diagnosis not present

## 2019-11-01 DIAGNOSIS — M79671 Pain in right foot: Secondary | ICD-10-CM | POA: Diagnosis not present

## 2019-11-01 DIAGNOSIS — M722 Plantar fascial fibromatosis: Secondary | ICD-10-CM | POA: Diagnosis not present

## 2019-11-01 DIAGNOSIS — M6281 Muscle weakness (generalized): Secondary | ICD-10-CM | POA: Diagnosis not present

## 2019-11-01 DIAGNOSIS — R262 Difficulty in walking, not elsewhere classified: Secondary | ICD-10-CM | POA: Diagnosis not present

## 2019-11-03 DIAGNOSIS — M25571 Pain in right ankle and joints of right foot: Secondary | ICD-10-CM | POA: Diagnosis not present

## 2019-11-03 DIAGNOSIS — R262 Difficulty in walking, not elsewhere classified: Secondary | ICD-10-CM | POA: Diagnosis not present

## 2019-11-03 DIAGNOSIS — M722 Plantar fascial fibromatosis: Secondary | ICD-10-CM | POA: Diagnosis not present

## 2019-11-03 DIAGNOSIS — M6281 Muscle weakness (generalized): Secondary | ICD-10-CM | POA: Diagnosis not present

## 2019-11-03 DIAGNOSIS — M79671 Pain in right foot: Secondary | ICD-10-CM | POA: Diagnosis not present

## 2019-11-04 ENCOUNTER — Ambulatory Visit (INDEPENDENT_AMBULATORY_CARE_PROVIDER_SITE_OTHER): Payer: Medicare Other | Admitting: Nurse Practitioner

## 2019-11-04 ENCOUNTER — Encounter: Payer: Self-pay | Admitting: Nurse Practitioner

## 2019-11-04 ENCOUNTER — Other Ambulatory Visit: Payer: Self-pay

## 2019-11-04 VITALS — BP 122/78 | HR 91 | Temp 98.0°F | Ht 63.6 in | Wt 152.0 lb

## 2019-11-04 DIAGNOSIS — I10 Essential (primary) hypertension: Secondary | ICD-10-CM

## 2019-11-04 DIAGNOSIS — R053 Chronic cough: Secondary | ICD-10-CM

## 2019-11-04 DIAGNOSIS — R05 Cough: Secondary | ICD-10-CM

## 2019-11-04 DIAGNOSIS — Z91018 Allergy to other foods: Secondary | ICD-10-CM

## 2019-11-04 DIAGNOSIS — G4733 Obstructive sleep apnea (adult) (pediatric): Secondary | ICD-10-CM | POA: Diagnosis not present

## 2019-11-04 DIAGNOSIS — Z9989 Dependence on other enabling machines and devices: Secondary | ICD-10-CM

## 2019-11-04 DIAGNOSIS — Z1231 Encounter for screening mammogram for malignant neoplasm of breast: Secondary | ICD-10-CM | POA: Diagnosis not present

## 2019-11-04 DIAGNOSIS — Z8616 Personal history of COVID-19: Secondary | ICD-10-CM

## 2019-11-04 MED ORDER — HYDROCHLOROTHIAZIDE 25 MG PO TABS: 25.0000 mg | ORAL_TABLET | Freq: Every day | ORAL | 1 refills | Status: DC

## 2019-11-04 MED ORDER — EPINEPHRINE 0.3 MG/0.3ML IJ SOAJ: 0.3000 mg | INTRAMUSCULAR | 2 refills | Status: DC | PRN

## 2019-11-04 NOTE — Progress Notes (Signed)
This visit occurred during the SARS-CoV-2 public health emergency.  Safety protocols were in place, including screening questions prior to the visit, additional usage of staff PPE, and extensive cleaning of exam room while observing appropriate contact time as indicated for disinfecting solutions.  Subjective:     Patient ID: Terri Barajas , female    DOB: 1946-02-07 , 74 y.o.   MRN: [id number]   Chief Complaint  Patient presents with  . Establish Care  . Cough    patient stated she had covid in january and she feels like the cough is stilll ingering around    HPI  Here to establish care, she was referred by Dr. Skeet Latch.  She had been seen by Dr. Briscoe Deutscher has not been seen since last year. She is retired, moved from [place] - 2.5 years. Married.  2 children - 36 y/o and 64 y/o - healthy.  Worked in Therapist, art.  PMH - glaucoma  - Dr. Fonnie Mu - [address] [phone].  Dr. Bonita Quin- GI - [address] [phone].    She had covid in January had infusion.  She has now had both of her covid vaccines.  She had fatigue, decreased energy, aching body.  She denies cough during that time.    She reports having a possible shellfish allergy - would have hives, itching and throat closing.       Hypertension This is a chronic problem. The current episode started more than 1 year ago. The problem is unchanged. The problem is controlled. Pertinent negatives include no anxiety, blurred vision, chest pain or peripheral edema. There are no associated agents to hypertension. Past treatments include diuretics. There are no compliance problems.  There is no history of angina. There is no history of chronic renal disease.     Past Medical History:  Diagnosis Date  . Anxiety   . Arthritis   . Complication of anesthesia    some type of sedation medication made her feel crazy  . Dysrhythmia   . Family history of adverse reaction to anesthesia     daughter has Malignant Hyperthermia hx -   . GERD (gastroesophageal reflux disease)   . Glaucoma   . H/O: hysterectomy 1978  . Heart disease   . History of hiatal hernia   . Hypertension   . MVP (mitral valve prolapse)   . Panic attacks   . Pre-diabetes      Family History  Problem Relation Age of Onset  . Pancreatic cancer Mother   . Alcohol abuse Father   . Asthma Sister   . Hypertension Sister   . Arthritis Sister   . Diabetes Brother   . Arthritis Brother   . Hyperlipidemia Brother   . Hypertension Brother   . Glaucoma Brother   . Colon polyps Brother   . Arthritis Sister   . Arthritis Sister   . Prostate cancer Brother   . Congenital heart disease Brother   . Colon cancer Neg Hx   . Esophageal cancer Neg Hx   . Inflammatory bowel disease Neg Hx   . Liver disease Neg Hx   . Rectal cancer Neg Hx   . Stomach cancer Neg Hx      Current Outpatient Medications:  .  ALPRAZolam (XANAX) 0.5 MG tablet, Take 0.5 tablets (0.25 mg total) by mouth daily as needed for anxiety., Disp: 45 tablet, Rfl: 0 .  aspirin 81 MG chewable tablet, Chew 81 mg by mouth  daily., Disp: , Rfl:  .  BIOTIN PO, Take 1 tablet by mouth daily. , Disp: , Rfl:  .  brimonidine (ALPHAGAN) 0.2 % ophthalmic solution, Place 1 drop into both eyes in the morning and at bedtime., Disp: , Rfl:  .  Cholecalciferol (VITAMIN D3) 25 MCG (1000 UT) CAPS, Take 1,000 Units by mouth daily., Disp: , Rfl:  .  DEXILANT 60 MG capsule, TAKE 1 CAPSULE BY MOUTH  DAILY, Disp: 90 capsule, Rfl: 3 .  diltiazem (CARDIZEM) 30 MG tablet, Take 1 tablet (30 mg total) by mouth 2 (two) times daily., Disp: 180 tablet, Rfl: 3 .  hydrochlorothiazide (HYDRODIURIL) 25 MG tablet, Take 1 tablet (25 mg total) by mouth daily., Disp: 90 tablet, Rfl: 3 .  latanoprost (XALATAN) 0.005 % ophthalmic solution, Place 1 drop into both eyes at bedtime. , Disp: , Rfl:  .  Multiple Vitamins-Minerals (CENTRUM SILVER 50+WOMEN PO), Take 1 tablet by mouth daily.,  Disp: , Rfl:  .  Omega-3 Fatty Acids (FISH OIL PO), Take 1 capsule by mouth daily., Disp: , Rfl:  .  polycarbophil (FIBERCON) 625 MG tablet, Take 625 mg by mouth daily., Disp: , Rfl:  .  polyethylene glycol (MIRALAX / GLYCOLAX) packet, Take 17 g by mouth daily., Disp: , Rfl:  .  guaiFENesin-codeine 100-10 MG/5ML syrup, Take 5 mLs by mouth every 6 (six) hours as needed for cough. (Patient not taking: Reported on 11/04/2019), Disp: 120 mL, Rfl: 0   Allergies  Allergen Reactions  . Atracurium & Derivatives Other (See Comments)    Unknown  . Atropine Other (See Comments)    Heart racing  . Iodine Other (See Comments)    Unknown  . Shellfish Allergy Other (See Comments)    Unknown  . Statins     "didn't feel right"     Review of Systems  Constitutional: Negative.   Eyes: Negative for blurred vision.  Cardiovascular: Negative for chest pain.     Today's Vitals   11/04/19 0939  BP: 122/78  Pulse: 91  Temp: 98 F (36.7 C)  TempSrc: Oral  SpO2: 97%  Weight: 152 lb (68.9 kg)  Height: 5' 3.6" (1.615 m)  PainSc: 0-No pain   Body mass index is 26.42 kg/m.   Objective:  Physical Exam Vitals reviewed.  Constitutional:      Appearance: Normal appearance.  Cardiovascular:     Rate and Rhythm: Normal rate and regular rhythm.     Pulses: Normal pulses.     Heart sounds: Normal heart sounds. No murmur.  Pulmonary:     Effort: Pulmonary effort is normal. No respiratory distress.     Breath sounds: Normal breath sounds.  Skin:    General: Skin is warm and dry.     Capillary Refill: Capillary refill takes less than 2 seconds.  Neurological:     General: No focal deficit present.     Mental Status: She is alert and oriented to person, place, and time.     Cranial Nerves: No cranial nerve deficit.  Psychiatric:        Mood and Affect: Mood normal.        Behavior: Behavior normal.        Thought Content: Thought content normal.        Judgment: Judgment normal.          Assessment And Plan:     1. Persistent cough  She reports this as ongoing and had a history of covid so I will refer  to pulmonology for further evaluation - DG Bone Density; Future - Ambulatory referral to Pulmonology  2. History of COVID-19  - Ambulatory referral to Pulmonology  3. Encounter for screening mammogram for malignant neoplasm of breast  Pt instructed on Self Breast Exam.According to ACOG guidelines Women aged 43 and older are recommended to get an annual mammogram. Form completed and given to patient contact the [facility] for appointment scheduing.   Pt encouraged to get annual mammogram - MM DIGITAL SCREENING BILATERAL; Future  4. OSA on CPAP  Chronic reports she wears regularly and benefits from wearing her CPAP   [name], FNP    THE PATIENT IS ENCOURAGED TO PRACTICE SOCIAL DISTANCING DUE TO THE COVID-19 PANDEMIC.

## 2019-11-05 ENCOUNTER — Other Ambulatory Visit: Payer: Self-pay | Admitting: Nurse Practitioner

## 2019-11-05 DIAGNOSIS — Z1231 Encounter for screening mammogram for malignant neoplasm of breast: Secondary | ICD-10-CM

## 2019-11-05 DIAGNOSIS — E2839 Other primary ovarian failure: Secondary | ICD-10-CM

## 2019-11-05 DIAGNOSIS — R053 Chronic cough: Secondary | ICD-10-CM

## 2019-11-09 ENCOUNTER — Telehealth: Payer: Self-pay

## 2019-11-09 LAB — ALLERGENS(96) FOODS
Allergen Apple, IgE: <0.1 kU/L
Allergen Banana IgE: <0.1 kU/L
Allergen Barley IgE: <0.1 kU/L
Allergen Black Pepper IgE: <0.1 kU/L
Allergen Blueberry IgE: <0.1 kU/L
Allergen Broccoli: <0.1 kU/L
Allergen Cabbage IgE: <0.1 kU/L
Allergen Carrot IgE: <0.1 kU/L
Allergen Cauliflower IgE: <0.1 kU/L
Allergen Celery IgE: <0.1 kU/L
Allergen Cinnamon IgE: <0.1 kU/L
Allergen Coconut IgE: <0.1 kU/L
Allergen Corn, IgE: <0.1 kU/L
Allergen Cucumber IgE: <0.1 kU/L
Allergen Garlic IgE: <0.1 kU/L
Allergen Ginger IgE: <0.1 kU/L
Allergen Gluten IgE: <0.1 kU/L
Allergen Grape IgE: <0.1 kU/L
Allergen Grapefruit IgE: <0.1 kU/L
Allergen Green Bean IgE: <0.1 kU/L
Allergen Green Bell Pepper IgE: <0.1 kU/L
Allergen Green Pea IgE: <0.1 kU/L
Allergen Lamb IgE: <0.1 kU/L
Allergen Lettuce IgE: <0.1 kU/L
Allergen Lime IgE: <0.1 kU/L
Allergen Melon IgE: <0.1 kU/L
Allergen Oat IgE: <0.1 kU/L
Allergen Onion IgE: <0.1 kU/L
Allergen Pear IgE: <0.1 kU/L
Allergen Potato, White IgE: <0.1 kU/L
Allergen Rice IgE: <0.1 kU/L
Allergen Salmon IgE: <0.1 kU/L
Allergen Strawberry IgE: <0.1 kU/L
Allergen Sweet Potato IgE: <0.1 kU/L
Allergen Tomato, IgE: <0.1 kU/L
Allergen Turkey IgE: <0.1 kU/L
Allergen Watermelon IgE: <0.1 kU/L
Allergen, Peach f95: <0.1 kU/L
Basil: <0.1 kU/L
Beef IgE: <0.1 kU/L
C074-IgE Gelatin: <0.1 kU/L
Chicken IgE: <0.1 kU/L
Chocolate/Cacao IgE: <0.1 kU/L
Clam IgE: <0.1 kU/L
Codfish IgE: <0.1 kU/L
Coffee: <0.1 kU/L
Cranberry IgE: <0.1 kU/L
Egg White IgE: <0.1 kU/L
F020-IgE Almond: <0.1 kU/L
F023-IgE Crab: 0.53 kU/L — AB
F045-IgE Yeast: <0.1 kU/L
F076-IgE Alpha Lactalbumin: <0.1 kU/L
F077-IgE Beta Lactoglobulin: <0.1 kU/L
F078-IgE Casein: <0.1 kU/L
F080-IgE Lobster: 0.34 kU/L — AB
F081-IgE Cheese, Cheddar Type: <0.1 kU/L
F089-IgE Mustard: <0.1 kU/L
F096-IgE Avocado: <0.1 kU/L
F202-IgE Cashew Nut: <0.1 kU/L
F214-IgE Spinach: <0.1 kU/L
F222-IgE Tea: <0.1 kU/L
F242-IgE Bing Cherry: <0.1 kU/L
F247-IgE Honey: <0.1 kU/L
F261-IgE Asparagus: <0.1 kU/L
F262-IgE Eggplant: <0.1 kU/L
F265-IgE Cumin: <0.1 kU/L
F278-IgE Bayleaf (Laurel): <0.1 kU/L
F279-IgE Chili Pepper: <0.1 kU/L
F283-IgE Oregano: <0.1 kU/L
F300-IgE Goat's Milk: <0.1 kU/L
F342-IgE Olive, Black: <0.1 kU/L
F343-IgE Raspberry: <0.1 kU/L
Hops: <0.1 kU/L
IgE Egg (Yolk): <0.1 kU/L
Kidney Bean IgE: <0.1 kU/L
Lemon: <0.1 kU/L
Lima Bean IgE: <0.1 kU/L
Malt: <0.1 kU/L
Mushroom IgE: <0.1 kU/L
Orange: <0.1 kU/L
Peanut IgE: <0.1 kU/L
Pineapple IgE: <0.1 kU/L
Pork IgE: <0.1 kU/L
Pumpkin IgE: <0.1 kU/L
Red Beet: <0.1 kU/L
Rye IgE: <0.1 kU/L
Scallop IgE: <0.1 kU/L
Sesame Seed IgE: <0.1 kU/L
Shrimp IgE: <0.1 kU/L
Soybean IgE: <0.1 kU/L
Tuna: <0.1 kU/L
Vanilla: <0.1 kU/L
Walnut IgE: <0.1 kU/L
Wheat IgE: <0.1 kU/L
Whey: <0.1 kU/L
White Bean IgE: <0.1 kU/L

## 2019-11-09 NOTE — Telephone Encounter (Signed)
Pt called wanting to discuss her lab results that she sees in [facility]

## 2019-11-10 DIAGNOSIS — M722 Plantar fascial fibromatosis: Secondary | ICD-10-CM | POA: Diagnosis not present

## 2019-11-10 DIAGNOSIS — M25571 Pain in right ankle and joints of right foot: Secondary | ICD-10-CM | POA: Diagnosis not present

## 2019-11-10 DIAGNOSIS — M79671 Pain in right foot: Secondary | ICD-10-CM | POA: Diagnosis not present

## 2019-11-10 DIAGNOSIS — R262 Difficulty in walking, not elsewhere classified: Secondary | ICD-10-CM | POA: Diagnosis not present

## 2019-11-10 DIAGNOSIS — M6281 Muscle weakness (generalized): Secondary | ICD-10-CM | POA: Diagnosis not present

## 2019-11-16 NOTE — Progress Notes (Signed)
[place] Report   Patient Details  Name: Terri Barajas MRN: [id number] Date of Birth: [birth year] Age: 74 y.o. PCP: [name], FNP  Vitals:   11/16/19 1643  BP: 124/72  Pulse: 84  SpO2: 97%  Weight: 153 lb 3.2 oz (69.5 kg)     [facility] Eval - 11/16/19 1600      Referral    Referring Provider  [facility]    Reason for referral  Inactivity    Program Start Date  --   late June start date TBD     Measurement   Waist Circumference  38.5 inches    Hip Circumference  40 inches    Body fat  44.4 percent      Information for Trainer   Goals  goal weight 130 lbs, strengthen and tone up    Current Exercise  none    Orthopedic Concerns  Right foot surgery in March    Pertinent Medical History  Glaucoma, HTN, MVP    Current Barriers  vacation so will start in late June     Restrictions/Precautions  Fall risk    Medications that affect exercise  Medication causing dizziness/drowsiness;Beta blocker      Timed Up and Go (TUGS)   Timed Up and Go  --   Recent foot surgery     Mobility and Daily Activities   I find it easy to walk up or down two or more flights of stairs.  4    I have no trouble taking out the trash.  4    I do housework such as vacuuming and dusting on my own without difficulty.  4    I can easily lift a gallon of milk (8lbs).  4    I can easily walk a mile.  4    I have no trouble reaching into high cupboards or reaching down to pick up something from the floor.  4    I do not have trouble doing out-door work such as Armed forces logistics/support/administrative officer, raking leaves, or gardening.  4      Mobility and Daily Activities   I feel younger than my age.  4    I feel independent.  4    I feel energetic.  4    I live an active life.   4    I feel strong.  4    I feel healthy.  2    I feel active as other people my age.  4      How fit and strong are you.   Fit and Strong Total Score  54      Past Medical History:  Diagnosis Date  . Anxiety   .  Arthritis   . Complication of anesthesia    some type of sedation medication made her feel crazy  . Dysrhythmia   . Family history of adverse reaction to anesthesia    daughter has Malignant Hyperthermia hx -   . GERD (gastroesophageal reflux disease)   . Glaucoma   . H/O: hysterectomy 1978  . Heart disease   . History of hiatal hernia   . Hypertension   . MVP (mitral valve prolapse)   . Panic attacks   . Pre-diabetes    Past Surgical History:  Procedure Laterality Date  . APPENDECTOMY  1972  . BIOPSY  07/06/2018   Procedure: BIOPSY;  Surgeon: Rush Landmark Telford Nab., MD;  Location: [place];  Service: Gastroenterology;;  . CATARACT EXTRACTION  2013  . [place]  . COLONOSCOPY    . ESOPHAGOGASTRODUODENOSCOPY (EGD) WITH PROPOFOL N/A 07/06/2018   Procedure: ESOPHAGOGASTRODUODENOSCOPY (EGD) WITH PROPOFOL;  Surgeon: Rush Landmark Telford Nab., MD;  Location: [place];  Service: Gastroenterology;  Laterality: N/A;  . HAND SURGERY Bilateral   . HEMORRHOID SURGERY  2018  . S/P Hysterectomy   1987   Social History   Tobacco Use  Smoking Status Never Smoker  Smokeless Tobacco Never Used    Will begin PREP late June T/Th 230p-345p. Will confirm when patient returns from vacation   Barnett Hatter 11/16/2019, 4:47 PM

## 2019-11-17 DIAGNOSIS — M79671 Pain in right foot: Secondary | ICD-10-CM | POA: Diagnosis not present

## 2019-11-17 DIAGNOSIS — R262 Difficulty in walking, not elsewhere classified: Secondary | ICD-10-CM | POA: Diagnosis not present

## 2019-11-17 DIAGNOSIS — M6281 Muscle weakness (generalized): Secondary | ICD-10-CM | POA: Diagnosis not present

## 2019-11-17 DIAGNOSIS — M25571 Pain in right ankle and joints of right foot: Secondary | ICD-10-CM | POA: Diagnosis not present

## 2019-11-17 DIAGNOSIS — M722 Plantar fascial fibromatosis: Secondary | ICD-10-CM | POA: Diagnosis not present

## 2019-11-19 ENCOUNTER — Other Ambulatory Visit: Payer: Self-pay

## 2019-11-19 ENCOUNTER — Encounter: Payer: Self-pay | Admitting: Cardiovascular Disease

## 2019-11-19 ENCOUNTER — Ambulatory Visit: Payer: Medicare Other | Admitting: Cardiovascular Disease

## 2019-11-19 VITALS — BP 107/63 | HR 69 | Ht 62.0 in | Wt 151.8 lb

## 2019-11-19 DIAGNOSIS — I10 Essential (primary) hypertension: Secondary | ICD-10-CM

## 2019-11-19 DIAGNOSIS — G4733 Obstructive sleep apnea (adult) (pediatric): Secondary | ICD-10-CM | POA: Diagnosis not present

## 2019-11-19 DIAGNOSIS — I700 Atherosclerosis of aorta: Secondary | ICD-10-CM

## 2019-11-19 DIAGNOSIS — I341 Nonrheumatic mitral (valve) prolapse: Secondary | ICD-10-CM

## 2019-11-19 DIAGNOSIS — Z9989 Dependence on other enabling machines and devices: Secondary | ICD-10-CM

## 2019-11-19 NOTE — Patient Instructions (Signed)
Medication Instructions:  Your physician recommends that you continue on your current medications as directed. Please refer to the Current Medication list given to you today.   *If you need a refill on your cardiac medications before your next appointment, please call your pharmacy*  Lab Work: NONE   Testing/Procedures: NONE  Follow-Up: At [facility], you and your health needs are our priority.  As part of our continuing mission to provide you with exceptional heart care, we have created designated Provider Care Teams.  These Care Teams include your primary Cardiologist (physician) and Advanced Practice Providers (APPs -  Physician Assistants and Nurse Practitioners) who all work together to provide you with the care you need, when you need it.  We recommend signing up for the patient portal called "MyChart".  Sign up information is provided on this After Visit Summary.  MyChart is used to connect with patients for Virtual Visits (Telemedicine).  Patients are able to view lab/test results, encounter notes, upcoming appointments, etc.  Non-urgent messages can be sent to your provider as well.   To learn more about what you can do with MyChart, go to [email or web address]://www.mychart.com.    Your next appointment:   12 month(s) You will receive a reminder letter in the mail two months in advance. If you don't receive a letter, please call our office to schedule the follow-up appointment.   The format for your next appointment:   In Person  Provider:   You may see Tiffany Hawthorne, MD or one of the following Advanced Practice Providers on your designated Care Team:    Luke Kilroy, PA-C  Callie Goodrich, PA-C  Jesse Cleaver, FNP    

## 2019-11-19 NOTE — Progress Notes (Signed)
Cardiology Office Note   Date:  11/20/2019   ID:  [name], [name] [birth year], MRN [id number]  PCP:  [name], FNP  Cardiologist:   Skeet Latch, MD   No chief complaint on file.    History of Present Illness: Terri Barajas is a 74 y.o. female with hypertension, hyperlipidemia, Barrett's esophagus here for follow up.  She was initially seen 06/2018 for the evaluation ofabnormal EKG.Terri Barajas a long history of mitral valve prolapse and was seeing a cardiologist in [place] before moving to [place]. She has had this diagnosis for at least 10 years. She also has been treated with nadolol for palpitations. She does not recall being told she had any specific arrhythmia diagnosis. The nadolol has controlled it well. According to her outside records she had an echo 09/2017 that revealed LVEF 60 to 65% with trivial mitral regurgitation and mild tricuspid regurgitation.No mitral valve prolapse was noted on this echo. She reports having a stress test in 2019 that was performed to follow-up her mitral valve disease but she does not recall having any symptoms of chest pain or shortness of breath at the time. She had an EKG in January 2020 that revealed anterior T wave inversions.At the time she was being seen in the ED for epigastric and arm pain. She has a longstanding history of intermittent abdominal distention and bloating. She is has undergone upper endoscopies and biopsies that revealed Barrett's esophagus gastritis. On that particular day her epigastric discomfort was associated with left arm pain. Cardiac enzymes were negative. She was instructed to follow-up with cardiology as an outpatient. Terri Barajas was referred for a cardiac CT-A but it could not be performed due to tachycardia.  Instead she had a [employer] 08/2018 that revealed LVEF 64% and no ischemia.  Terri Barajas was not taking her diuretic because she did not like how it  made her go to the bathroom so often.  Therefore hydrochlorothiazide was switched to amlodipine, as her blood pressure was also elevated.  She followed up with our pharmacist 05/2019 and her blood pressure was well have been controlled.  She switched back to HCTZ because she didn't like how amlodipine made her feel.  She started taking the hydrochlorothiazide daily and no longer has issues with frequent urination.  The morning nadolol was switched to metoprolol due to insurance reasons.  She had headache so this was changed to diltiazem.  Since her last appointment she became positive for COVID-19.  She and her husband were both treated at the outpatient [facility] infusion center.  At her last appointment she was started on diltiazem for tachycardia. She hasn't noticed any change in her heart rate.  Her heart continues to be elevated with exertion and sometimes with rest.  She has occasional episodes of right-sided chest pain that occur randomly.  It never happens with exertion.  She has no lower extremity edema, orthopnea, or PND.  Overall she is feeling well.   Past Medical History:  Diagnosis Date  . Anxiety   . Arthritis   . Complication of anesthesia    some type of sedation medication made her feel crazy  . Dysrhythmia   . Family history of adverse reaction to anesthesia    daughter has Malignant Hyperthermia hx -   . GERD (gastroesophageal reflux disease)   . Glaucoma   . H/O: hysterectomy 1978  . Heart disease   . History of hiatal hernia   . Hypertension   . MVP (  mitral valve prolapse)   . Panic attacks   . Pre-diabetes     Past Surgical History:  Procedure Laterality Date  . APPENDECTOMY  1972  . BIOPSY  07/06/2018   Procedure: BIOPSY;  Surgeon: Rush Landmark Telford Nab., MD;  Location: [facility];  Service: Gastroenterology;;  . CATARACT EXTRACTION  2013  . [place]  . COLONOSCOPY    . ESOPHAGOGASTRODUODENOSCOPY (EGD) WITH PROPOFOL N/A 07/06/2018    Procedure: ESOPHAGOGASTRODUODENOSCOPY (EGD) WITH PROPOFOL;  Surgeon: Rush Landmark Telford Nab., MD;  Location: [facility];  Service: Gastroenterology;  Laterality: N/A;  . HAND SURGERY Bilateral   . HEMORRHOID SURGERY  2018  . S/P Hysterectomy   1987     Current Outpatient Medications  Medication Sig Dispense Refill  . ALPRAZolam (XANAX) 0.5 MG tablet Take 0.5 tablets (0.25 mg total) by mouth daily as needed for anxiety. 45 tablet 0  . aspirin 81 MG chewable tablet Chew 81 mg by mouth daily.    Marland Kitchen BIOTIN PO Take 1 tablet by mouth daily.     . brimonidine (ALPHAGAN) 0.2 % ophthalmic solution Place 1 drop into both eyes in the morning and at bedtime.    . Cholecalciferol (VITAMIN D3) 25 MCG (1000 UT) CAPS Take 1,000 Units by mouth daily.    Marland Kitchen DEXILANT 60 MG capsule TAKE 1 CAPSULE BY MOUTH  DAILY 90 capsule 3  . diltiazem (CARDIZEM) 30 MG tablet Take 1 tablet (30 mg total) by mouth 2 (two) times daily. 180 tablet 3  . hydrochlorothiazide (HYDRODIURIL) 25 MG tablet Take 1 tablet (25 mg total) by mouth daily. 90 tablet 1  . latanoprost (XALATAN) 0.005 % ophthalmic solution Place 1 drop into both eyes at bedtime.     . Multiple Vitamins-Minerals (CENTRUM SILVER 50+WOMEN PO) Take 1 tablet by mouth daily.    . Omega-3 Fatty Acids (FISH OIL PO) Take 1 capsule by mouth daily.    . polycarbophil (FIBERCON) 625 MG tablet Take 625 mg by mouth daily.    . polyethylene glycol (MIRALAX / GLYCOLAX) packet Take 17 g by mouth daily.     No current facility-administered medications for this visit.    Allergies:   Atracurium & derivatives, Atropine, Iodine, Shellfish allergy, and Statins    Social History:  The patient  reports that she has never smoked. She has never used smokeless tobacco. She reports previous alcohol use. She reports previous drug use.   Family History:  [name] family history includes Alcohol abuse in her father; Arthritis in her brother, sister, sister, and sister; Asthma in her  sister; Colon polyps in her brother; Congenital heart disease in her brother; Diabetes in her brother; Glaucoma in her brother; Hyperlipidemia in her brother; Hypertension in her brother and sister; Pancreatic cancer in her mother; Prostate cancer in her brother.    ROS:  Please see the history of present illness.   Otherwise, review of systems are positive for none.   All other systems are reviewed and negative.    PHYSICAL EXAM: VS:  BP 107/63   Pulse 69   Ht 5\' 2"  (1.575 m)   Wt 151 lb 12.8 oz (68.9 kg)   LMP  (LMP Unknown)   SpO2 100%   BMI 27.76 kg/m  , BMI Body mass index is 27.76 kg/m. GENERAL:  Well appearing HEENT: Pupils equal round and reactive, fundi not visualized, oral mucosa unremarkable NECK:  No jugular venous distention, waveform within normal limits, carotid upstroke brisk and symmetric, no  bruits LUNGS:  Clear to auscultation bilaterally HEART:  RRR.  PMI not displaced or sustained,S1 and S2 within normal limits, no S3, no S4, no clicks, no rubs, no murmurs ABD:  Flat, positive bowel sounds normal in frequency in pitch, no bruits, no rebound, no guarding, no midline pulsatile mass, no hepatomegaly, no splenomegaly EXT:  2 plus pulses throughout, no edema, no cyanosis no clubbing SKIN:  No rashes no nodules NEURO:  Cranial nerves II through XII grossly intact, motor grossly intact throughout PSYCH:  Cognitively intact, oriented to person place and time    EKG:  EKG is ordered today. The ekg ordered 07/13/18 demonstrates sinus rhythm.  Rate 70 bpm.  Non-specific ST changes.  04/15/19: Sinus rate 79 bpm.  Nonspecific ST changes.  11/19/19: Sinus rhythm.  Rate 70 bpm.     Recent Labs: 08/03/2019: ALT 13; BUN 14; Creatinine, Ser 0.76; Hemoglobin 12.0; Platelets 216.0; Potassium 3.5; Sodium 138; TSH 1.22    Lipid Panel    Component Value Date/Time   CHOL 270 (H) 05/27/2018 1045   TRIG 191.0 (H) 05/27/2018 1045   HDL 54.70 05/27/2018 1045   CHOLHDL 5 05/27/2018  1045   VLDL 38.2 05/27/2018 1045   LDLCALC 177 (H) 05/27/2018 1045      Wt Readings from Last 3 Encounters:  11/19/19 151 lb 12.8 oz (68.9 kg)  11/16/19 153 lb 3.2 oz (69.5 kg)  11/04/19 152 lb (68.9 kg)      ASSESSMENT AND PLAN:  # Atypical chest pain: # Hyperlipidemia: Symptoms were very atypical for ischemia and more consistent with GERD.  She has no exertional symptoms and reportedly had a normal stress test.  She was referred for a coronary CT-A but never had it.  She remains asymptomatic and not interested in further testing at this time.  # Hypertension: Blood pressure well-controlled.  Continue hydrochlorothiazide and diltiazem.  # Sinus tachycardia:  Ms. Barajas is having episodes of anxiousness, suspect may be related to tachycardia.  It was better controlled on nadolol but seems to have recurred.  We discussed stopping the diltiazem but she wants to keep taking it.  # Mitral valve prolapse: There is no mention of mitral valve prolapse on her last 2 echocardiograms.  She had only trivial mitral irritation.  There is no evidence of heart failure on exam.  No need to repeat echocardiogram at this time.  Nadolol was switched to metoprolol and now diltiazem.    Current medicines are reviewed at length with the patient today.  The patient does not have concerns regarding medicines.  The following changes have been made:  no change  Labs/ tests ordered today include:   No orders of the defined types were placed in this encounter.    Disposition:   FU with Irish Breisch C. Oval Linsey, MD, [facility] in 1 year.    Signed, Sheran Newstrom C. Oval Linsey, MD, [facility]  11/20/2019 9:37 AM    [place]

## 2019-11-20 ENCOUNTER — Encounter: Payer: Self-pay | Admitting: Cardiovascular Disease

## 2019-11-22 ENCOUNTER — Telehealth: Payer: Self-pay

## 2019-11-22 NOTE — Telephone Encounter (Signed)
Follow up on start date for PREP: She and her husband will return from vacation on 01/10/20 so will plan on starting after return.

## 2019-11-24 ENCOUNTER — Other Ambulatory Visit: Payer: Self-pay

## 2019-11-24 ENCOUNTER — Ambulatory Visit (INDEPENDENT_AMBULATORY_CARE_PROVIDER_SITE_OTHER): Payer: Medicare Other | Admitting: Podiatry

## 2019-11-24 DIAGNOSIS — M722 Plantar fascial fibromatosis: Secondary | ICD-10-CM

## 2019-11-24 DIAGNOSIS — Z9889 Other specified postprocedural states: Secondary | ICD-10-CM

## 2019-11-24 NOTE — Progress Notes (Signed)
   Subjective:  Patient presents today status post EPF right. DOS: 08/26/2019.  Patient states that she is doing very well and has minimal pain.  She is walking just fine in good supportive tennis shoes.  She has been wearing the compression ankle sleeve.  No new complaints at this time    Past Medical History:  Diagnosis Date  . Anxiety   . Arthritis   . Complication of anesthesia    some type of sedation medication made her feel crazy  . Dysrhythmia   . Family history of adverse reaction to anesthesia    daughter has Malignant Hyperthermia hx -   . GERD (gastroesophageal reflux disease)   . Glaucoma   . H/O: hysterectomy 1978  . Heart disease   . History of hiatal hernia   . Hypertension   . MVP (mitral valve prolapse)   . Panic attacks   . Pre-diabetes       Objective/Physical Exam Neurovascular status intact.  Skin incisions are healed.  There is minimal tenderness to palpation along the medial aspect of the heel.  Greatly improved since prior to surgery.  Range of motion within normal limits.  Assessment: 1. s/p EPF right. DOS: 08/26/2019   Plan of Care:  1. Patient was evaluated.  2.  Continue good supportive shoes and compression anklet as needed.  Patient may resume full activity no restrictions. 3.  Return to clinic as needed  Edrick Kins, DPM [facility]  Dr. Edrick Kins, [place]                                        [place], [place] [address]                Office [phone]  Fax [phone]

## 2020-01-11 DIAGNOSIS — H401132 Primary open-angle glaucoma, bilateral, moderate stage: Secondary | ICD-10-CM | POA: Diagnosis not present

## 2020-01-14 ENCOUNTER — Institutional Professional Consult (permissible substitution): Payer: Medicare Other | Admitting: Pulmonary Disease

## 2020-01-17 NOTE — Progress Notes (Signed)
[facility] [employer] PREP Weekly Session   Patient Details  Name: Terri Barajas MRN: [id number] Date of Birth: [birth year] Age: 74 y.o. 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

## 2020-01-18 DIAGNOSIS — G4733 Obstructive sleep apnea (adult) (pediatric): Secondary | ICD-10-CM | POA: Diagnosis not present

## 2020-01-24 ENCOUNTER — Telehealth: Payer: Self-pay | Admitting: Gastroenterology

## 2020-01-31 NOTE — Progress Notes (Signed)
[facility] [employer] PREP Weekly Session   Patient Details  Name: Terri Barajas MRN: [id number] Date of Birth: 04-15-1946 Age: 74 y.o. PCP: [name], FNP  Vitals:   01/31/20 1458  Weight: 150 lb (68 kg)     [facility] [employer] Weekly seesion - 01/31/20 1400      Weekly Session   Topic Discussed Health habits    Minutes exercised this week --   yard work   Classes attended to date 4          Fun things since last meeting: entertained family   Barnett Hatter 01/31/2020, 2:59 PM

## 2020-02-07 ENCOUNTER — Encounter: Payer: Self-pay | Admitting: Nurse Practitioner

## 2020-02-07 ENCOUNTER — Ambulatory Visit (INDEPENDENT_AMBULATORY_CARE_PROVIDER_SITE_OTHER): Payer: Medicare Other | Admitting: Nurse Practitioner

## 2020-02-07 ENCOUNTER — Other Ambulatory Visit: Payer: Self-pay

## 2020-02-07 VITALS — BP 118/72 | HR 71 | Temp 98.1°F | Ht 62.0 in | Wt 149.0 lb

## 2020-02-07 DIAGNOSIS — R05 Cough: Secondary | ICD-10-CM

## 2020-02-07 DIAGNOSIS — R053 Chronic cough: Secondary | ICD-10-CM

## 2020-02-07 DIAGNOSIS — K219 Gastro-esophageal reflux disease without esophagitis: Secondary | ICD-10-CM

## 2020-02-07 DIAGNOSIS — I10 Essential (primary) hypertension: Secondary | ICD-10-CM | POA: Diagnosis not present

## 2020-02-07 NOTE — Progress Notes (Signed)
I,Yamilka Roman [employer] as a Education administrator for [employer], FNP.,have documented all relevant documentation on the behalf of [name], FNP,as directed by  [name], FNP while in the presence of [name], [place]. This visit occurred during the SARS-CoV-2 public health emergency.  Safety protocols were in place, including screening questions prior to the visit, additional usage of staff PPE, and extensive cleaning of exam room while observing appropriate contact time as indicated for disinfecting solutions.  Subjective:     Patient ID: Terri Barajas , female    DOB: 05-Dec-1945 , 74 y.o.   MRN: [id number]   Chief Complaint  Patient presents with  . Hypertension    HPI  She is scheduled to see the Pulmonologist on Aug 31st for her persistent cough. Cough drops and dimetapp.    Hypertension This is a chronic problem. The current episode started more than 1 year ago. The problem is controlled. Pertinent negatives include no anxiety. There are no associated agents to hypertension. Risk factors for coronary artery disease include sedentary lifestyle. Past treatments include diuretics and angiotensin blockers. The current treatment provides significant improvement. There is no history of angina or kidney disease. There is no history of chronic renal disease.     Past Medical History:  Diagnosis Date  . Anxiety   . Arthritis   . Complication of anesthesia    some type of sedation medication made her feel crazy  . Dysrhythmia   . Family history of adverse reaction to anesthesia    daughter has Malignant Hyperthermia hx -   . GERD (gastroesophageal reflux disease)   . Glaucoma   . H/O: hysterectomy 1978  . Heart disease   . History of hiatal hernia   . Hypertension   . MVP (mitral valve prolapse)   . Panic attacks   . Pre-diabetes      Family History  Problem Relation Age of Onset  . Pancreatic cancer Mother   . Alcohol abuse Father   . Asthma Sister   . Hypertension  Sister   . Arthritis Sister   . Diabetes Brother   . Arthritis Brother   . Hyperlipidemia Brother   . Hypertension Brother   . Glaucoma Brother   . Colon polyps Brother   . Arthritis Sister   . Arthritis Sister   . Prostate cancer Brother   . Congenital heart disease Brother   . Colon cancer Neg Hx   . Esophageal cancer Neg Hx   . Inflammatory bowel disease Neg Hx   . Liver disease Neg Hx   . Rectal cancer Neg Hx   . Stomach cancer Neg Hx      Current Outpatient Medications:  .  ALPRAZolam (XANAX) 0.5 MG tablet, Take 0.5 tablets (0.25 mg total) by mouth daily as needed for anxiety., Disp: 45 tablet, Rfl: 0 .  aspirin 81 MG chewable tablet, Chew 81 mg by mouth daily., Disp: , Rfl:  .  BIOTIN PO, Take 1 tablet by mouth daily. , Disp: , Rfl:  .  brimonidine (ALPHAGAN) 0.2 % ophthalmic solution, Place 1 drop into both eyes in the morning and at bedtime., Disp: , Rfl:  .  Cholecalciferol (VITAMIN D3) 25 MCG (1000 UT) CAPS, Take 1,000 Units by mouth daily., Disp: , Rfl:  .  DEXILANT 60 MG capsule, TAKE 1 CAPSULE BY MOUTH  DAILY, Disp: 90 capsule, Rfl: 3 .  diltiazem (CARDIZEM) 30 MG tablet, Take 1 tablet (30 mg total) by mouth 2 (two) times daily., Disp:  180 tablet, Rfl: 3 .  hydrochlorothiazide (HYDRODIURIL) 25 MG tablet, Take 1 tablet (25 mg total) by mouth daily., Disp: 90 tablet, Rfl: 1 .  latanoprost (XALATAN) 0.005 % ophthalmic solution, Place 1 drop into both eyes at bedtime. , Disp: , Rfl:  .  Multiple Vitamins-Minerals (CENTRUM SILVER 50+WOMEN PO), Take 1 tablet by mouth daily., Disp: , Rfl:  .  Omega-3 Fatty Acids (FISH OIL PO), Take 1 capsule by mouth daily., Disp: , Rfl:  .  polycarbophil (FIBERCON) 625 MG tablet, Take 625 mg by mouth daily., Disp: , Rfl:  .  polyethylene glycol (MIRALAX / GLYCOLAX) packet, Take 17 g by mouth daily., Disp: , Rfl:    Allergies  Allergen Reactions  . Atracurium & Derivatives Other (See Comments)    Unknown  . Atropine Other (See Comments)     Heart racing  . Iodine Other (See Comments)    Unknown  . Shellfish Allergy Other (See Comments)    Unknown  . Statins     "didn't feel right"     Review of Systems  Constitutional: Negative.   Respiratory: Negative.   Cardiovascular: Negative.   Gastrointestinal: Negative.   Musculoskeletal: Negative.   Psychiatric/Behavioral: Negative.      Today's Vitals   02/07/20 1108  BP: 118/72  Pulse: 71  Temp: 98.1 F (36.7 C)  TempSrc: Oral  Weight: 149 lb (67.6 kg)  Height: 5' 2"  (1.575 m)   Body mass index is 27.25 kg/m.   Objective:  Physical Exam Constitutional:      General: She is not in acute distress.    Appearance: Normal appearance. She is normal weight.  Cardiovascular:     Rate and Rhythm: Normal rate and regular rhythm.     Pulses: Normal pulses.     Heart sounds: Normal heart sounds.  Pulmonary:     Effort: Pulmonary effort is normal.     Breath sounds: Normal breath sounds.  Neurological:     General: No focal deficit present.     Mental Status: She is alert and oriented to person, place, and time.  Psychiatric:        Mood and Affect: Mood normal.        Behavior: Behavior normal.        Thought Content: Thought content normal.        Judgment: Judgment normal.         Assessment And Plan:     1. Essential hypertension  Chronic, well controlled  Continue with current medications - BMP8+eGFR  2. Persistent cough  She is scheduled to see the Pulmonologist in the next month  Offered tessalon perles she would like to try HBP [place]  3. Gastroesophageal reflux disease without esophagitis  Chronic, continue with Dexilant     Patient was given opportunity to ask questions. Patient verbalized understanding of the plan and was able to repeat key elements of the plan. All questions were answered to their satisfaction.  [name], FNP   I, [name], FNP, have reviewed all documentation for this visit. The documentation on  02/08/20 for the exam, diagnosis, procedures, and orders are all accurate and complete.  THE PATIENT IS ENCOURAGED TO PRACTICE SOCIAL DISTANCING DUE TO THE COVID-19 PANDEMIC.

## 2020-02-07 NOTE — Patient Instructions (Signed)
   Get COVID Booster in December and you can get your flu vaccine next month.

## 2020-02-08 LAB — BMP8+EGFR
BUN/Creatinine Ratio: 29 — ABNORMAL HIGH (ref 12–28)
BUN: 21 mg/dL (ref 8–27)
CO2: 27 mmol/L (ref 20–29)
Calcium: 10 mg/dL (ref 8.7–10.3)
Chloride: 95 mmol/L — ABNORMAL LOW (ref 96–106)
Creatinine, Ser: 0.73 mg/dL (ref 0.57–1.00)
GFR calc Af Amer: 95 mL/min/{1.73_m2} (ref >59)
GFR calc non Af Amer: 83 mL/min/{1.73_m2} (ref >59)
Glucose: 127 mg/dL — ABNORMAL HIGH (ref 65–99)
Potassium: 3.3 mmol/L — ABNORMAL LOW (ref 3.5–5.2)
Sodium: 138 mmol/L (ref 134–144)

## 2020-02-09 ENCOUNTER — Ambulatory Visit
Admission: RE | Admit: 2020-02-09 | Discharge: 2020-02-09 | Disposition: A | Discharge status: Home / Self Care | Payer: Medicare Other | Source: Ambulatory Visit | Attending: Nurse Practitioner | Admitting: Nurse Practitioner

## 2020-02-09 ENCOUNTER — Other Ambulatory Visit: Payer: Self-pay

## 2020-02-09 DIAGNOSIS — Z1231 Encounter for screening mammogram for malignant neoplasm of breast: Secondary | ICD-10-CM

## 2020-02-09 DIAGNOSIS — E2839 Other primary ovarian failure: Secondary | ICD-10-CM

## 2020-02-09 DIAGNOSIS — Z780 Asymptomatic menopausal state: Secondary | ICD-10-CM | POA: Diagnosis not present

## 2020-02-15 ENCOUNTER — Ambulatory Visit: Payer: Medicare Other | Admitting: Pulmonary Disease

## 2020-02-15 ENCOUNTER — Other Ambulatory Visit: Payer: Self-pay

## 2020-02-15 ENCOUNTER — Encounter: Payer: Self-pay | Admitting: Pulmonary Disease

## 2020-02-15 ENCOUNTER — Ambulatory Visit (INDEPENDENT_AMBULATORY_CARE_PROVIDER_SITE_OTHER): Payer: Medicare Other

## 2020-02-15 VITALS — BP 120/80 | HR 81 | Temp 96.5°F | Ht 62.0 in | Wt 147.6 lb

## 2020-02-15 DIAGNOSIS — R059 Cough, unspecified: Secondary | ICD-10-CM

## 2020-02-15 DIAGNOSIS — R05 Cough: Secondary | ICD-10-CM | POA: Diagnosis not present

## 2020-02-15 LAB — CBC WITH DIFFERENTIAL/PLATELET
Basophils Absolute: 0 10*3/uL (ref 0.0–0.1)
Basophils Relative: 0.8 % (ref 0.0–3.0)
Eosinophils Absolute: 0.2 10*3/uL (ref 0.0–0.7)
Eosinophils Relative: 3.3 % (ref 0.0–5.0)
HCT: 39.2 % (ref 36.0–46.0)
Hemoglobin: 13.3 g/dL (ref 12.0–15.0)
Lymphocytes Relative: 30.3 % (ref 12.0–46.0)
Lymphs Abs: 1.7 10*3/uL (ref 0.7–4.0)
MCHC: 34 g/dL (ref 30.0–36.0)
MCV: 89.9 fl (ref 78.0–100.0)
Monocytes Absolute: 0.5 10*3/uL (ref 0.1–1.0)
Monocytes Relative: 8.4 % (ref 3.0–12.0)
Neutro Abs: 3.3 10*3/uL (ref 1.4–7.7)
Neutrophils Relative %: 57.2 % (ref 43.0–77.0)
Platelets: 248 10*3/uL (ref 150.0–400.0)
RBC: 4.36 Mil/uL (ref 3.87–5.11)
RDW: 13.3 % (ref 11.5–15.5)
WBC: 5.7 10*3/uL (ref 4.0–10.5)

## 2020-02-15 MED ORDER — FLUTICASONE PROPIONATE 50 MCG/ACT NA SUSP: 2.0000 | Freq: Every day | NASAL | 2 refills | Status: DC

## 2020-02-15 NOTE — Progress Notes (Signed)
Terri Barajas    [id number]    November 28, 1945  Primary Care Physician:Moore, Doreene Burke, [place]  Referring Physician: [name], [address] [place] [place],  [place] [address]  Chief complaint: Consult for chronic cough  HPI: 74 year old with history of hypertension, sleep apnea [follows with Dr. Dohmeir] Referred for evaluation of chronic cough She was diagnosed with COVID-19 in January 2021.  Did not require hospitalization and was treated with monoclonal antibody Has developed chronic nonproductive cough since then.  Not associated with dyspnea, wheezing, sputum production. Complains of irritation at the back of the throat and feeling of mucus stuck..  Show significant acid reflux and is treated with Dexilant by Dr. Rush Landmark.  Also has sleep apnea for which she is on CPAP by Dr. Anastasia Pall.   Pets: No pets Occupation: Used to work in Therapist, art at [employer], clinic. Exposures: No known exposures.  No mold, hot tub, Jacuzzi.  No feather pillows or comforters Smoking history: Never smoker Travel history: Previously lived in [place] Relevant family history: No significant family issue of lung disease  Outpatient Encounter Medications as of 02/15/2020  Medication Sig  . aspirin 81 MG chewable tablet Chew 81 mg by mouth daily.  Marland Kitchen BIOTIN PO Take 1 tablet by mouth daily.   . brimonidine (ALPHAGAN) 0.2 % ophthalmic solution Place 1 drop into both eyes in the morning and at bedtime.  . Cholecalciferol (VITAMIN D3) 25 MCG (1000 UT) CAPS Take 1,000 Units by mouth daily.  Marland Kitchen DEXILANT 60 MG capsule TAKE 1 CAPSULE BY MOUTH  DAILY  . diltiazem (CARDIZEM) 30 MG tablet Take 1 tablet (30 mg total) by mouth 2 (two) times daily.  Marland Kitchen latanoprost (XALATAN) 0.005 % ophthalmic solution Place 1 drop into both eyes at bedtime.   . Multiple Vitamins-Minerals (CENTRUM SILVER 50+WOMEN PO) Take 1 tablet by mouth daily.  . Omega-3 Fatty Acids (FISH OIL PO) Take 1 capsule by  mouth daily.  . polycarbophil (FIBERCON) 625 MG tablet Take 625 mg by mouth daily.  . polyethylene glycol (MIRALAX / GLYCOLAX) packet Take 17 g by mouth daily.  Marland Kitchen ALPRAZolam (XANAX) 0.5 MG tablet Take 0.5 tablets (0.25 mg total) by mouth daily as needed for anxiety. (Patient not taking: Reported on 02/15/2020)  . hydrochlorothiazide (HYDRODIURIL) 25 MG tablet Take 1 tablet (25 mg total) by mouth daily.   No facility-administered encounter medications on file as of 02/15/2020.    Allergies as of 02/15/2020 - Review Complete 02/15/2020  Allergen Reaction Noted  . Atracurium & derivatives Other (See Comments) 12/23/2017  . Atropine Other (See Comments) 02/17/2018  . Iodine Other (See Comments) 12/23/2017  . Shellfish allergy Other (See Comments) 12/23/2017  . Statins  07/21/2018    Past Medical History:  Diagnosis Date  . Anxiety   . Arthritis   . Complication of anesthesia    some type of sedation medication made her feel crazy  . Dysrhythmia   . Family history of adverse reaction to anesthesia    daughter has Malignant Hyperthermia hx -   . GERD (gastroesophageal reflux disease)   . Glaucoma   . H/O: hysterectomy 1978  . Heart disease   . History of hiatal hernia   . Hypertension   . MVP (mitral valve prolapse)   . Panic attacks   . Pre-diabetes     Past Surgical History:  Procedure Laterality Date  . APPENDECTOMY  1972  . BIOPSY  07/06/2018   Procedure: BIOPSY;  Surgeon: Irving Copas., MD;  Location: [facility];  Service: Gastroenterology;;  . CATARACT EXTRACTION  2013  . [place]  . COLONOSCOPY    . ESOPHAGOGASTRODUODENOSCOPY (EGD) WITH PROPOFOL N/A 07/06/2018   Procedure: ESOPHAGOGASTRODUODENOSCOPY (EGD) WITH PROPOFOL;  Surgeon: Rush Landmark Telford Nab., MD;  Location: [facility];  Service: Gastroenterology;  Laterality: N/A;  . HAND SURGERY Bilateral   . HEMORRHOID SURGERY  2018  . S/P Hysterectomy   1987    Family History    Problem Relation Age of Onset  . Pancreatic cancer Mother   . Alcohol abuse Father   . Asthma Sister   . Hypertension Sister   . Arthritis Sister   . Diabetes Brother   . Arthritis Brother   . Hyperlipidemia Brother   . Hypertension Brother   . Glaucoma Brother   . Colon polyps Brother   . Arthritis Sister   . Arthritis Sister   . Prostate cancer Brother   . Congenital heart disease Brother   . Colon cancer Neg Hx   . Esophageal cancer Neg Hx   . Inflammatory bowel disease Neg Hx   . Liver disease Neg Hx   . Rectal cancer Neg Hx   . Stomach cancer Neg Hx     Social History   Socioeconomic History  . Marital status: Married    Spouse name: Not on file  . Number of children: Not on file  . Years of education: 58  . Highest education level: Not on file  Occupational History  . Not on file  Tobacco Use  . Smoking status: Never Smoker  . Smokeless tobacco: Never Used  Vaping Use  . Vaping Use: Never used  Substance and Sexual Activity  . Alcohol use: Not Currently  . Drug use: Not Currently  . Sexual activity: Yes    Partners: Male  Other Topics Concern  . Not on file  Social History Narrative  . Not on file   Social Determinants of Health   Financial Resource Strain:   . Difficulty of Paying Living Expenses: Not on file  Food Insecurity:   . Worried About Charity fundraiser in the Last Year: Not on file  . Ran Out of Food in the Last Year: Not on file  Transportation Needs:   . Lack of Transportation (Medical): Not on file  . Lack of Transportation (Non-Medical): Not on file  Physical Activity:   . Days of Exercise per Week: Not on file  . Minutes of Exercise per Session: Not on file  Stress:   . Feeling of Stress : Not on file  Social Connections:   . Frequency of Communication with Friends and Family: Not on file  . Frequency of Social Gatherings with Friends and Family: Not on file  . Attends Religious Services: Not on file  . Active Member of  Clubs or Organizations: Not on file  . Attends Archivist Meetings: Not on file  . Marital Status: Not on file  Intimate Partner Violence:   . Fear of Current or Ex-Partner: Not on file  . Emotionally Abused: Not on file  . Physically Abused: Not on file  . Sexually Abused: Not on file    Review of systems: Review of Systems  Constitutional: Negative for fever and chills.  HENT: Negative.   Eyes: Negative for blurred vision.  Respiratory: as per HPI  Cardiovascular: Negative for chest pain and palpitations.  Gastrointestinal: Negative for vomiting, diarrhea, blood per  rectum. Genitourinary: Negative for dysuria, urgency, frequency and hematuria.  Musculoskeletal: Negative for myalgias, back pain and joint pain.  Skin: Negative for itching and rash.  Neurological: Negative for dizziness, tremors, focal weakness, seizures and loss of consciousness.  Endo/Heme/Allergies: Negative for environmental allergies.  Psychiatric/Behavioral: Negative for depression, suicidal ideas and hallucinations.  All other systems reviewed and are negative.  Physical Exam: Blood pressure 120/80, pulse 81, temperature (!) 96.5 F (35.8 C), temperature source Temporal, height 5\' 2"  (1.575 m), weight 147 lb 9.6 oz (67 kg), SpO2 100 %. Gen:      No acute distress HEENT:  EOMI, sclera anicteric Neck:     No masses; no thyromegaly Lungs:    Clear to auscultation bilaterally; normal respiratory effort CV:         Regular rate and rhythm; no murmurs Abd:      + bowel sounds; soft, non-tender; no palpable masses, no distension Ext:    No edema; adequate peripheral perfusion Skin:      Warm and dry; no rash Neuro: alert and oriented x 3 Psych: normal mood and affect  Data Reviewed: Imaging: CT abdomen pelvis 01/22/2018-visualized lung bases are normal Chest x-ray 07/04/2019-basal interstitial prominence I have reviewed images personally.  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  If abnormal then we will proceed with CT high-resolution Check CBC with differential, IgE for baseline evaluation  OSA On CPAP.  Follows with neurology.  Plan/Recommendations: CBC, IgE Chest x-ray, PFTs Flonase, chlorpheniramine  Marshell Garfinkel MD [facility] 02/15/2020, 10:03 AM  CC: [name], FNP

## 2020-02-15 NOTE — Addendum Note (Signed)
Addended by: Suzzanne Cloud E on: 02/15/2020 10:36 AM   Modules accepted: Orders

## 2020-02-15 NOTE — Patient Instructions (Signed)
We will get a chest x-ray today Check CBC with differential, IgE Schedule pulmonary function test  Continue Dexilant for acid reflux For postnasal drip will start Flonase Start chlorpheniramine 8 mg 3 times daily.  This medication is available over-the-counter  Follow-up in 1 to 2 months after the studies.

## 2020-02-15 NOTE — Addendum Note (Signed)
Addended by: Vanessa Barbara on: 02/15/2020 10:29 AM   Modules accepted: Orders

## 2020-02-16 ENCOUNTER — Other Ambulatory Visit: Payer: Self-pay | Admitting: Pulmonary Disease

## 2020-02-16 DIAGNOSIS — J849 Interstitial pulmonary disease, unspecified: Secondary | ICD-10-CM

## 2020-02-16 LAB — IGE: IgE (Immunoglobulin E), Serum: 20 kU/L (ref <=114)

## 2020-03-01 ENCOUNTER — Other Ambulatory Visit: Payer: Self-pay | Admitting: Nurse Practitioner

## 2020-03-01 ENCOUNTER — Other Ambulatory Visit: Payer: Medicare Other

## 2020-03-01 ENCOUNTER — Other Ambulatory Visit: Payer: Self-pay

## 2020-03-01 DIAGNOSIS — E876 Hypokalemia: Secondary | ICD-10-CM

## 2020-03-02 DIAGNOSIS — Z20822 Contact with and (suspected) exposure to covid-19: Secondary | ICD-10-CM | POA: Diagnosis not present

## 2020-03-02 LAB — BMP8+EGFR
BUN/Creatinine Ratio: 26 (ref 12–28)
BUN: 21 mg/dL (ref 8–27)
CO2: 30 mmol/L — ABNORMAL HIGH (ref 20–29)
Calcium: 10 mg/dL (ref 8.7–10.3)
Chloride: 100 mmol/L (ref 96–106)
Creatinine, Ser: 0.82 mg/dL (ref 0.57–1.00)
GFR calc Af Amer: 83 mL/min/{1.73_m2} (ref >59)
GFR calc non Af Amer: 72 mL/min/{1.73_m2} (ref >59)
Glucose: 106 mg/dL — ABNORMAL HIGH (ref 65–99)
Potassium: 4 mmol/L (ref 3.5–5.2)
Sodium: 140 mmol/L (ref 134–144)

## 2020-03-06 ENCOUNTER — Other Ambulatory Visit: Payer: Self-pay

## 2020-03-06 ENCOUNTER — Ambulatory Visit (INDEPENDENT_AMBULATORY_CARE_PROVIDER_SITE_OTHER)
Admission: RE | Admit: 2020-03-06 | Discharge: 2020-03-06 | Disposition: A | Discharge status: Home / Self Care | Payer: Medicare Other | Source: Ambulatory Visit | Attending: Pulmonary Disease | Admitting: Pulmonary Disease

## 2020-03-06 DIAGNOSIS — R918 Other nonspecific abnormal finding of lung field: Secondary | ICD-10-CM | POA: Diagnosis not present

## 2020-03-06 DIAGNOSIS — M47814 Spondylosis without myelopathy or radiculopathy, thoracic region: Secondary | ICD-10-CM | POA: Diagnosis not present

## 2020-03-06 DIAGNOSIS — R0600 Dyspnea, unspecified: Secondary | ICD-10-CM | POA: Diagnosis not present

## 2020-03-06 DIAGNOSIS — J84112 Idiopathic pulmonary fibrosis: Secondary | ICD-10-CM | POA: Diagnosis not present

## 2020-03-06 DIAGNOSIS — J849 Interstitial pulmonary disease, unspecified: Secondary | ICD-10-CM

## 2020-03-07 ENCOUNTER — Other Ambulatory Visit: Payer: Self-pay | Admitting: Pulmonary Disease

## 2020-03-07 DIAGNOSIS — J849 Interstitial pulmonary disease, unspecified: Secondary | ICD-10-CM

## 2020-03-07 DIAGNOSIS — R0602 Shortness of breath: Secondary | ICD-10-CM

## 2020-03-08 ENCOUNTER — Encounter: Payer: Self-pay | Admitting: Nurse Practitioner

## 2020-03-10 ENCOUNTER — Other Ambulatory Visit: Payer: Self-pay | Admitting: Nurse Practitioner

## 2020-03-10 DIAGNOSIS — I10 Essential (primary) hypertension: Secondary | ICD-10-CM

## 2020-04-05 ENCOUNTER — Telehealth: Payer: Self-pay

## 2020-04-05 NOTE — Telephone Encounter (Signed)
I left a message asking the pt to call and reschedule AWV with [place].

## 2020-04-06 ENCOUNTER — Encounter: Payer: Self-pay | Admitting: Nurse Practitioner

## 2020-04-07 NOTE — Telephone Encounter (Signed)
Dr. Mannam, please see mychart message sent by pt and advise. 

## 2020-04-07 NOTE — Telephone Encounter (Signed)
Sent in prescription for Tessalon 200 mg twice daily as needed for cough She is still taking the Dexilant for acid reflux?Marland Kitchen  Ask her to take it at night

## 2020-04-08 MED ORDER — BENZONATATE 200 MG PO CAPS: 200.0000 mg | ORAL_CAPSULE | Freq: Two times a day (BID) | ORAL | 1 refills | Status: DC | PRN

## 2020-04-08 NOTE — Addendum Note (Signed)
Addended by: Lia Foyer R on: 04/08/2020 11:03 AM   Modules accepted: Orders

## 2020-04-11 DIAGNOSIS — H401132 Primary open-angle glaucoma, bilateral, moderate stage: Secondary | ICD-10-CM | POA: Diagnosis not present

## 2020-04-19 DIAGNOSIS — G4733 Obstructive sleep apnea (adult) (pediatric): Secondary | ICD-10-CM | POA: Diagnosis not present

## 2020-04-21 DIAGNOSIS — H401132 Primary open-angle glaucoma, bilateral, moderate stage: Secondary | ICD-10-CM | POA: Diagnosis not present

## 2020-04-21 DIAGNOSIS — I10 Essential (primary) hypertension: Secondary | ICD-10-CM | POA: Diagnosis not present

## 2020-05-04 ENCOUNTER — Ambulatory Visit
Admission: EM | Admit: 2020-05-04 | Discharge: 2020-05-04 | Disposition: A | Discharge status: Home / Self Care | Payer: Medicare Other | Attending: Physician Assistant | Admitting: Physician Assistant

## 2020-05-04 ENCOUNTER — Encounter: Payer: Self-pay | Admitting: Nurse Practitioner

## 2020-05-04 ENCOUNTER — Other Ambulatory Visit: Payer: Self-pay

## 2020-05-04 DIAGNOSIS — M5442 Lumbago with sciatica, left side: Secondary | ICD-10-CM | POA: Diagnosis not present

## 2020-05-04 LAB — POCT URINALYSIS DIP (MANUAL ENTRY)
Bilirubin, UA: NEGATIVE
Blood, UA: NEGATIVE
Glucose, UA: NEGATIVE mg/dL
Ketones, POC UA: NEGATIVE mg/dL
Leukocytes, UA: NEGATIVE
Nitrite, UA: NEGATIVE
Protein Ur, POC: NEGATIVE mg/dL
Spec Grav, UA: 1.02 (ref 1.010–1.025)
Urobilinogen, UA: 2 E.U./dL — AB
pH, UA: 6.5 (ref 5.0–8.0)

## 2020-05-04 MED ORDER — TIZANIDINE HCL 2 MG PO TABS: 2.0000 mg | ORAL_TABLET | Freq: Three times a day (TID) | ORAL | 0 refills | Status: DC | PRN

## 2020-05-04 MED ORDER — IBUPROFEN 800 MG PO TABS: 800.0000 mg | ORAL_TABLET | Freq: Three times a day (TID) | ORAL | 0 refills | Status: DC

## 2020-05-04 NOTE — ED Provider Notes (Signed)
[name]    CSN: [id number] Arrival date & time: 05/04/20  1459      History   Chief Complaint Chief Complaint  Patient presents with  . Back Pain    HPI Terri Barajas is a 74 y.o. female.   74 year old female comes in for 2 day history of left back/hip pain that radiates down the leg. Denies injury/trauma. Pain mostly with ROM and ambulation. States has also noticed some changes in urine color, and have been urinating more frequently. Denies dysuria. Denies vaginal itching. Denies saddle anesthesia, loss of bladder or bowel control.      Past Medical History:  Diagnosis Date  . Anxiety   . Arthritis   . Complication of anesthesia    some type of sedation medication made her feel crazy  . Dysrhythmia   . Family history of adverse reaction to anesthesia    daughter has Malignant Hyperthermia hx -   . GERD (gastroesophageal reflux disease)   . Glaucoma   . H/O: hysterectomy 1978  . Heart disease   . History of hiatal hernia   . Hypertension   . MVP (mitral valve prolapse)   . Panic attacks   . Pre-diabetes     Patient Active Problem List   Diagnosis Date Noted  . OSA on CPAP 01/06/2019  . Abnormal CT of the abdomen 10/04/2018  . Radiculopathy affecting upper extremity, left 07/25/2018  . Hyperlipidemia 05/13/2018  . Situational anxiety 04/29/2018  . MVP (mitral valve prolapse) 04/29/2018  . Sleep-disordered breathing 04/29/2018  . Family History of Hyperthermia, malignant 04/23/2018  . Barrett's esophagus 03/01/2018  . Elevated lipase 02/18/2018  . Hiatal hernia 02/18/2018  . Constipation 02/18/2018  . Bloating 02/18/2018  . Aortic atherosclerosis ([place]) 01/23/2018  . GERD (gastroesophageal reflux disease)   . Glaucoma   . Hypertension     Past Surgical History:  Procedure Laterality Date  . APPENDECTOMY  1972  . BIOPSY  07/06/2018   Procedure: BIOPSY;  Surgeon: Rush Landmark Telford Nab., MD;  Location: [facility];  Service:  Gastroenterology;;  . CATARACT EXTRACTION  2013  . [place]  . COLONOSCOPY    . ESOPHAGOGASTRODUODENOSCOPY (EGD) WITH PROPOFOL N/A 07/06/2018   Procedure: ESOPHAGOGASTRODUODENOSCOPY (EGD) WITH PROPOFOL;  Surgeon: Rush Landmark Telford Nab., MD;  Location: [facility];  Service: Gastroenterology;  Laterality: N/A;  . HAND SURGERY Bilateral   . HEMORRHOID SURGERY  2018  . S/P Hysterectomy   1987    OB History   No obstetric history on file.      Home Medications    Prior to Admission medications   Medication Sig Start Date End Date Taking? Authorizing Provider  ALPRAZolam Duanne Moron) 0.5 MG tablet Take 0.5 tablets (0.25 mg total) by mouth daily as needed for anxiety. Patient not taking: Reported on 02/15/2020 04/01/19   Briscoe Deutscher, DO  aspirin 81 MG chewable tablet Chew 81 mg by mouth daily.    [provider]  benzonatate (TESSALON) 200 MG capsule Take 1 capsule (200 mg total) by mouth 2 (two) times daily as needed for cough. 04/08/20   Mannam, Hart Robinsons, MD  BIOTIN PO Take 1 tablet by mouth daily.     [provider]  brimonidine (ALPHAGAN) 0.2 % ophthalmic solution Place 1 drop into both eyes in the morning and at bedtime.    [provider]  Cholecalciferol (VITAMIN D3) 25 MCG (1000 UT) CAPS Take 1,000 Units by mouth daily.    [provider]  DEXILANT 60 MG capsule TAKE 1 CAPSULE BY MOUTH  DAILY 07/29/19   Mansouraty, Telford Nab., MD  diltiazem (CARDIZEM) 30 MG tablet Take 1 tablet (30 mg total) by mouth 2 (two) times daily. 09/17/19   Skeet Latch, MD  fluticasone [facility] 50 MCG/ACT nasal spray Place 2 sprays into both nostrils daily. 02/15/20   Mannam, Hart Robinsons, MD  hydrochlorothiazide (HYDRODIURIL) 25 MG tablet TAKE 1 TABLET BY MOUTH  DAILY 03/13/20   [name], FNP  ibuprofen (ADVIL) 800 MG tablet Take 1 tablet (800 mg total) by mouth 3 (three) times daily. 05/04/20   Tasia Catchings, Diamond Jentz V, PA-C  latanoprost (XALATAN) 0.005 %  ophthalmic solution Place 1 drop into both eyes at bedtime.     [provider]  Multiple Vitamins-Minerals (CENTRUM SILVER 50+WOMEN PO) Take 1 tablet by mouth daily.    [provider]  Omega-3 Fatty Acids (FISH OIL PO) Take 1 capsule by mouth daily.    [provider]  polycarbophil (FIBERCON) 625 MG tablet Take 625 mg by mouth daily.    [provider]  polyethylene glycol (MIRALAX / GLYCOLAX) packet Take 17 g by mouth daily.    [provider]  tiZANidine (ZANAFLEX) 2 MG tablet Take 1 tablet (2 mg total) by mouth every 8 (eight) hours as needed for muscle spasms. 05/04/20   Ok Edwards, PA-C    Family History Family History  Problem Relation Age of Onset  . Pancreatic cancer Mother   . Alcohol abuse Father   . Asthma Sister   . Hypertension Sister   . Arthritis Sister   . Diabetes Brother   . Arthritis Brother   . Hyperlipidemia Brother   . Hypertension Brother   . Glaucoma Brother   . Colon polyps Brother   . Arthritis Sister   . Arthritis Sister   . Prostate cancer Brother   . Congenital heart disease Brother   . Colon cancer Neg Hx   . Esophageal cancer Neg Hx   . Inflammatory bowel disease Neg Hx   . Liver disease Neg Hx   . Rectal cancer Neg Hx   . Stomach cancer Neg Hx     Social History Social History   Tobacco Use  . Smoking status: Never Smoker  . Smokeless tobacco: Never Used  Vaping Use  . Vaping Use: Never used  Substance Use Topics  . Alcohol use: Not Currently  . Drug use: Not Currently     Allergies   Atracurium & derivatives, Atropine, Iodine, Shellfish allergy, and Statins   Review of Systems Review of Systems  Reason unable to perform ROS: See HPI as above.     Physical Exam Triage Vital Signs ED Triage Vitals  Enc Vitals Group     BP 05/04/20 1513 (!) 144/71     Pulse Rate 05/04/20 1513 99     Resp 05/04/20 1513 18     Temp 05/04/20 1513 98.1 F (36.7 C)     Temp Source 05/04/20 1513  Oral     SpO2 05/04/20 1513 95 %     Weight --      Height --      Head Circumference --      Peak Flow --      Pain Score 05/04/20 1521 8     Pain Loc --      Pain Edu? --      Excl. in [place]? --    No data found.  Updated Vital  Signs BP (!) 144/71 (BP Location: Left Arm)   Pulse 99   Temp 98.1 F (36.7 C) (Oral)   Resp 18   LMP  (LMP Unknown)   SpO2 95%   Physical Exam Constitutional:      General: She is not in acute distress.    Appearance: She is well-developed. She is not diaphoretic.  HENT:     Head: Normocephalic and atraumatic.  Eyes:     Conjunctiva/sclera: Conjunctivae normal.     Pupils: Pupils are equal, round, and reactive to light.  Cardiovascular:     Rate and Rhythm: Normal rate and regular rhythm.     Heart sounds: Normal heart sounds. No murmur heard.  No friction rub. No gallop.   Pulmonary:     Effort: Pulmonary effort is normal. No accessory muscle usage or respiratory distress.     Breath sounds: Normal breath sounds. No stridor. No decreased breath sounds, wheezing, rhonchi or rales.  Abdominal:     Tenderness: There is no right CVA tenderness or left CVA tenderness.  Musculoskeletal:     Comments: No tenderness on palpation of the spinous processes, back, hip. Full range of motion of back. Full passive ROM of hip. Strength/sensation intact distally. Positive left straight leg raise.  Skin:    General: Skin is warm and dry.  Neurological:     Mental Status: She is alert and oriented to person, place, and time.      UC Treatments / Results  Labs (all labs ordered are listed, but only abnormal results are displayed) Labs Reviewed  POCT URINALYSIS DIP (MANUAL ENTRY) - Abnormal; Notable for the following components:      Result Value   Urobilinogen, UA 2.0 (*)    All other components within normal limits    EKG   Radiology No results found.  Procedures Procedures (including critical care time)  Medications Ordered in  UC Medications - No data to display  Initial Impression / Assessment and Plan / UC Course  I have reviewed the triage vital signs and the nursing notes.  Pertinent labs & imaging results that were available during my care of the patient were reviewed by me and considered in my medical decision making (see chart for details).    Urine dipstick negative for leuks, nitrite, blood. History and exam consistent with sciatica. Discussed NSAIDs vs prednisone. Patient would like to defer prednisone for now. NSAIDs, muscle relaxant as directed. Return precautions given.  Final Clinical Impressions(s) / UC Diagnoses   Final diagnoses:  Acute left-sided low back pain with left-sided sciatica    ED Prescriptions    Medication Sig Dispense Auth. Provider   ibuprofen (ADVIL) 800 MG tablet Take 1 tablet (800 mg total) by mouth 3 (three) times daily. 21 tablet Jerelle Virden V, PA-C   tiZANidine (ZANAFLEX) 2 MG tablet Take 1 tablet (2 mg total) by mouth every 8 (eight) hours as needed for muscle spasms. 15 tablet Ok Edwards, PA-C     PDMP not reviewed this encounter.   Ok Edwards, PA-C 05/04/20 1552

## 2020-05-04 NOTE — ED Triage Notes (Signed)
Pt c/o lt hip pain radiating down lt leg x2 days and worsen last night. Denies injury. States used heating pad and tylenol with no relief.

## 2020-05-04 NOTE — Discharge Instructions (Signed)
Ibuprofen as directed. Tizanidine as needed, this can make you drowsy, so do not take if you are going to drive, operate heavy machinery, or make important decisions. Ice/heat compresses as needed. This can take up to 3-4 weeks to completely resolve, but you should be feeling better each week. Follow up with PCP/orthopedics if symptoms worsen, changes for reevaluation. If experience numbness/tingling of the inner thighs, loss of bladder or bowel control, go to the emergency department for evaluation.

## 2020-05-05 ENCOUNTER — Ambulatory Visit: Payer: Medicare Other | Admitting: Gastroenterology

## 2020-05-05 ENCOUNTER — Telehealth: Payer: Self-pay | Admitting: Gastroenterology

## 2020-05-05 ENCOUNTER — Encounter: Payer: Self-pay | Admitting: Gastroenterology

## 2020-05-05 VITALS — BP 126/70 | HR 84 | Ht 63.0 in | Wt 150.2 lb

## 2020-05-05 DIAGNOSIS — R140 Abdominal distension (gaseous): Secondary | ICD-10-CM | POA: Diagnosis not present

## 2020-05-05 DIAGNOSIS — K219 Gastro-esophageal reflux disease without esophagitis: Secondary | ICD-10-CM | POA: Diagnosis not present

## 2020-05-05 DIAGNOSIS — K5900 Constipation, unspecified: Secondary | ICD-10-CM | POA: Diagnosis not present

## 2020-05-05 NOTE — Patient Instructions (Signed)
Please call office when you need refill of your Dexilant.. At that time Dexilant will be decreased to 30mg  - once daily. Dexilant may also be over that counter at that time, but we will check to see.   Please follow [employer] Diet.   Please call office and let us know if you would like to proceed with SIBO breath test. I will fax new order at that time.   If you are age 72 or older, your body mass index should be between 23-30. Your Body mass index is 26.62 kg/m. If this is out of the aforementioned range listed, please consider follow up with your Primary Care Provider.  If you are age 37 or younger, your body mass index should be between 19-25. Your Body mass index is 26.62 kg/m. If this is out of the aformentioned range listed, please consider follow up with your Primary Care Provider.   Thank you for choosing me and [facility].  Dr. Rush Landmark

## 2020-05-05 NOTE — Progress Notes (Signed)
GASTROENTEROLOGY OUTPATIENT CLINIC VISIT   Primary Care Provider [name], [place] [address] [phone]  Patient Profile: Terri Barajas is a 74 y.o. female  with a pmh significant for HTN, Glaucoma, MVP, GERD, Barrett's esophagus (negative on recent 2020 EGD).  The patient presents to the [facility] for an evaluation and management of problem(s) noted below:  Problem List 1. Bloating   2. Gastroesophageal reflux disease without esophagitis   3. Constipation, unspecified constipation type     History of Present Illness Please see initial consultation note and follow-up notes for full details of HPI.    Interval History The patient had COVID-19 earlier this year as did her husband and had quite a bit of issues for the next few months.  She is still dealing with a nagging cough.  With that being said overall she has been doing relatively well.  She is continued over-the-counter simethicone as well as FiberCon.  She still has episodes for days at a time where she will have significant bloating and distention.  She never completed the SIBO breath test and still has it at home although is not clear if she has an expired SIBO breath test at this time.  She will be due for Barrett's screen in 2023 and consideration of a colonoscopy had previously been discussed in the setting of abnormal imaging findings though deferred because she was not having symptoms.  The patient has no other GI complaints at this time.  She is wondering if she needs to continue on her current dose of Dexilant as it is quite expensive but seems to be controlling her symptoms.  GI Review of Systems Positive as above including medically controlled GERD on Dexilant 60 mg Negative for odynophagia, dysphagia, nausea, vomiting, change in bowel habits, melena, hematochezia  Review of Systems General: Denies fevers/chills/unintentional weight  loss Cardiovascular: Denies current chest pain/palpitations Pulmonary: Denies shortness of breath Gastroenterological: See HPI Genitourinary: Denies darkened urine Hematological: Denies easy bruising Skin: Denies jaundice Psychological: Mood is stable   Medications Current Outpatient Medications  Medication Sig Dispense Refill  . ALPRAZolam (XANAX) 0.5 MG tablet Take 0.5 tablets (0.25 mg total) by mouth daily as needed for anxiety. 45 tablet 0  . aspirin 81 MG chewable tablet Chew 81 mg by mouth daily.    . benzonatate (TESSALON) 200 MG capsule Take 1 capsule (200 mg total) by mouth 2 (two) times daily as needed for cough. 30 capsule 1  . BIOTIN PO Take 1 tablet by mouth daily.     . brimonidine (ALPHAGAN) 0.2 % ophthalmic solution Place 1 drop into both eyes in the morning and at bedtime.    . Cholecalciferol (VITAMIN D3) 25 MCG (1000 UT) CAPS Take 1,000 Units by mouth daily.    Marland Kitchen DEXILANT 60 MG capsule TAKE 1 CAPSULE BY MOUTH  DAILY 90 capsule 3  . diltiazem (CARDIZEM) 30 MG tablet Take 1 tablet (30 mg total) by mouth 2 (two) times daily. 180 tablet 3  . diphenhydrAMINE (BENADRYL) 25 MG tablet Take 25 mg by mouth as needed.    . dorzolamide-timolol (COSOPT) 22.3-6.8 MG/ML ophthalmic solution Place 1 drop into both eyes 2 (two) times daily.    . fluticasone (FLONASE) 50 MCG/ACT nasal spray Place 2 sprays into both nostrils daily. 16 g 2  . hydrochlorothiazide (HYDRODIURIL) 25 MG tablet TAKE 1 TABLET BY MOUTH  DAILY 90 tablet 3  . ibuprofen (ADVIL) 800 MG tablet Take 1 tablet (  800 mg total) by mouth 3 (three) times daily. 21 tablet 0  . latanoprost (XALATAN) 0.005 % ophthalmic solution Place 1 drop into both eyes at bedtime.     . Multiple Vitamins-Minerals (CENTRUM SILVER 50+WOMEN PO) Take 1 tablet by mouth daily.    . Omega-3 Fatty Acids (FISH OIL PO) Take 1 capsule by mouth daily.    . polycarbophil (FIBERCON) 625 MG tablet Take 625 mg by mouth daily.    . polyethylene glycol  (MIRALAX / GLYCOLAX) packet Take 17 g by mouth daily.    Marland Kitchen tiZANidine (ZANAFLEX) 2 MG tablet Take 1 tablet (2 mg total) by mouth every 8 (eight) hours as needed for muscle spasms. 15 tablet 0  . EPINEPHrine 0.3 mg/0.3 mL IJ SOAJ injection Inject 0.3 mg into the muscle as needed. (Patient not taking: Reported on 05/05/2020)     No current facility-administered medications for this visit.   Allergies Allergies  Allergen Reactions  . Atracurium & Derivatives Other (See Comments)    Unknown  . Atropine Other (See Comments)    Heart racing  . Iodine Other (See Comments)    Unknown  . Shellfish Allergy Other (See Comments)    Unknown  . Statins     "didn't feel right"   Histories Past Medical History:  Diagnosis Date  . Anxiety   . Arthritis   . Complication of anesthesia    some type of sedation medication made her feel crazy  . Dysrhythmia   . Family history of adverse reaction to anesthesia    daughter has Malignant Hyperthermia hx -   . GERD (gastroesophageal reflux disease)   . Glaucoma   . H/O: hysterectomy 1978  . Heart disease   . History of hiatal hernia   . Hypertension   . MVP (mitral valve prolapse)   . Panic attacks   . Pre-diabetes   . Sciatica    Past Surgical History:  Procedure Laterality Date  . APPENDECTOMY  1972  . BIOPSY  07/06/2018   Procedure: BIOPSY;  Surgeon: Rush Landmark Telford Nab., MD;  Location: [facility];  Service: Gastroenterology;;  . CATARACT EXTRACTION  2013  . [place]  . COLONOSCOPY    . ESOPHAGOGASTRODUODENOSCOPY (EGD) WITH PROPOFOL N/A 07/06/2018   Procedure: ESOPHAGOGASTRODUODENOSCOPY (EGD) WITH PROPOFOL;  Surgeon: Rush Landmark Telford Nab., MD;  Location: [facility];  Service: Gastroenterology;  Laterality: N/A;  . HAND SURGERY Bilateral   . HEMORRHOID SURGERY  2018  . S/P Hysterectomy   1987   Social History   Socioeconomic History  . Marital status: Married    Spouse name: Not on file  . Number of  children: Not on file  . Years of education: 82  . Highest education level: Not on file  Occupational History  . Not on file  Tobacco Use  . Smoking status: Never Smoker  . Smokeless tobacco: Never Used  Vaping Use  . Vaping Use: Never used  Substance and Sexual Activity  . Alcohol use: Not Currently  . Drug use: Not Currently  . Sexual activity: Yes    Partners: Male  Other Topics Concern  . Not on file  Social History Narrative  . Not on file   Social Determinants of Health   Financial Resource Strain:   . Difficulty of Paying Living Expenses: Not on file  Food Insecurity:   . Worried About Charity fundraiser in the Last Year: Not on file  . Ran Out of Food in  the Last Year: Not on file  Transportation Needs:   . Lack of Transportation (Medical): Not on file  . Lack of Transportation (Non-Medical): Not on file  Physical Activity:   . Days of Exercise per Week: Not on file  . Minutes of Exercise per Session: Not on file  Stress:   . Feeling of Stress : Not on file  Social Connections:   . Frequency of Communication with Friends and Family: Not on file  . Frequency of Social Gatherings with Friends and Family: Not on file  . Attends Religious Services: Not on file  . Active Member of Clubs or Organizations: Not on file  . Attends Archivist Meetings: Not on file  . Marital Status: Not on file  Intimate Partner Violence:   . Fear of Current or Ex-Partner: Not on file  . Emotionally Abused: Not on file  . Physically Abused: Not on file  . Sexually Abused: Not on file   Family History  Problem Relation Age of Onset  . Pancreatic cancer Mother   . Alcohol abuse Father   . Asthma Sister   . Hypertension Sister   . Arthritis Sister   . Diabetes Brother   . Arthritis Brother   . Hyperlipidemia Brother   . Hypertension Brother   . Glaucoma Brother   . Colon polyps Brother   . Arthritis Sister   . Arthritis Sister   . Prostate cancer Brother   .  Congenital heart disease Brother   . Colon cancer Neg Hx   . Esophageal cancer Neg Hx   . Inflammatory bowel disease Neg Hx   . Liver disease Neg Hx   . Rectal cancer Neg Hx   . Stomach cancer Neg Hx    I have reviewed her medical, social, and family history in detail and updated the electronic medical record as necessary.    PHYSICAL EXAMINATION  BP 126/70 (BP Location: Left Arm, Patient Position: Sitting, Cuff Size: Normal)   Pulse 84   Ht 5\' 3"  (1.6 m) Comment: height measured without shoes  Wt 150 lb 4 oz (68.2 kg)   LMP  (LMP Unknown)   BMI 26.62 kg/m  GEN: NAD, appears stated age, doesn't appear chronically ill PSYCH: Cooperative, without pressured speech EYE: Conjunctivae pink, sclerae anicteric ENT: MMM CV: Nontachycardic RESP: No audible wheezing GI: NABS, soft, ND, without rebound or guarding, no HSM appreciated MSK/EXT: No lower extremity edema SKIN: No jaundice NEURO:  Alert & Oriented x 3, no focal deficits   REVIEW OF DATA  I reviewed the following data at the time of this encounter:  GI Procedures and Studies  No new relevant studies to review  Laboratory Studies  Reviewed in EPIC  Imaging Studies  No new studies   ASSESSMENT  Ms. Higuchi is a 74 y.o. female with a pmh significant for HTN, Glaucoma, MVP, GERD, Barrett's esophagus (negative on recent 2020 EGD).  The patient is seen today for a return visit for evaluation and management of:  1. Bloating   2. Gastroesophageal reflux disease without esophagitis   3. Constipation, unspecified constipation type    The patient is clinically and hemodynamically stable.  Overall has done well from a GI perspective though still has a few days every few months where she will have significant bloating and abdominal distention.  We previously held on SIBO breath testing but as symptoms still persist she is interested in considering doing this test at this time.  Her GERD symptoms remain  stable we will try and  decrease her dosing in the course of the coming months down to 30 mg (there is a chance Dexilant and will become generic as well which may help with her finances).  Patient not having changes in her bowel habits otherwise and is taking her medications with good effect.  We continue to hold on the previous CT scan findings suggesting a decompressed colon as she had a prior colonoscopy prior to moving here but if symptoms change we may consider a diagnostic colonoscopy earlier than 2023.  From a Barrett's esophagus perspective although last endoscopy was not concerning she will be due for Barrett's screening in 2023.  All patient questions were answered to the best of my ability, and the patient agrees to the aforementioned plan of action with follow-up as indicated.   PLAN  Continue simethicone Strongly consider SIBO breath testing Holding on repeat colonoscopy due to patient's clinical stability though may consider in 2023 or earlier if symptoms develop 2023 EGD to ensure no evidence of Barrett's esophagus Continue FiberCon Continue MiraLAX as needed Holding on Amitiza/Linzess for now Continue Dexilant 60 mg for now but will consider decreasing to 30 mg daily once patient's current prescription finishes   No orders of the defined types were placed in this encounter.   New Prescriptions   No medications on file   Modified Medications   No medications on file    Planned Follow Up: No follow-ups on file.   Total Time in Face-to-Face and in Coordination of Care for patient including independent/personal interpretation/review of prior testing, medical history, examination, medication adjustment, communicating results with the patient directly, and documentation with the EHR is 25 minutes.  [name], MD [facility] Office # [phone]

## 2020-05-08 NOTE — Telephone Encounter (Signed)
Left message for pt. She will need to come by office and pick up kit.

## 2020-05-09 ENCOUNTER — Encounter: Payer: Self-pay | Admitting: Gastroenterology

## 2020-05-13 ENCOUNTER — Encounter: Payer: Self-pay | Admitting: Nurse Practitioner

## 2020-05-18 ENCOUNTER — Ambulatory Visit (INDEPENDENT_AMBULATORY_CARE_PROVIDER_SITE_OTHER): Payer: Medicare Other

## 2020-05-18 VITALS — BP 138/80 | HR 83 | Temp 97.4°F | Ht 62.8 in | Wt 149.4 lb

## 2020-05-18 DIAGNOSIS — Z0000 Encounter for general adult medical examination without abnormal findings: Secondary | ICD-10-CM

## 2020-05-18 NOTE — Patient Instructions (Signed)
Terri Barajas , Thank you for taking time to come for your Medicare Wellness Visit. I appreciate your ongoing commitment to your health goals. Please review the following plan we discussed and let me know if I can assist you in the future.   Screening recommendations/referrals: Colonoscopy: completed 03/30/2015, due 03/29/2025 Mammogram: completed 02/09/2020, due 02/08/2021 Bone Density: completed 02/09/2020 Recommended yearly ophthalmology/optometry visit for glaucoma screening and checkup Recommended yearly dental visit for hygiene and checkup  Vaccinations: Influenza vaccine: completed 03/16/2020, due 01/15/2021 Pneumococcal vaccine: completed 03/11/2019 Tdap vaccine: due Shingles vaccine: discussed   Covid-19: 09/02/2019, 09/28/2019, 03/31/2020  Advanced directives: Please bring a copy of your POA (Power of Attorney) and/or Living Will to your next appointment.   Conditions/risks identified: none  Next appointment: 05/24/2020 at 4:15 Follow up in one year for your annual wellness visit    Preventive Care 65 Years and Older, Female Preventive care refers to lifestyle choices and visits with your health care provider that can promote health and wellness. What does preventive care include?  A yearly physical exam. This is also called an annual well check.  Dental exams once or twice a year.  Routine eye exams. Ask your health care provider how often you should have your eyes checked.  Personal lifestyle choices, including:  Daily care of your teeth and gums.  Regular physical activity.  Eating a healthy diet.  Avoiding tobacco and drug use.  Limiting alcohol use.  Practicing safe sex.  Taking low-dose aspirin every day.  Taking vitamin and mineral supplements as recommended by your health care provider. What happens during an annual well check? The services and screenings done by your health care provider during your annual well check will depend on your age, overall  health, lifestyle risk factors, and family history of disease. Counseling  Your health care provider may ask you questions about your:  Alcohol use.  Tobacco use.  Drug use.  Emotional well-being.  Home and relationship well-being.  Sexual activity.  Eating habits.  History of falls.  Memory and ability to understand (cognition).  Work and work Statistician.  Reproductive health. Screening  You may have the following tests or measurements:  Height, weight, and BMI.  Blood pressure.  Lipid and cholesterol levels. These may be checked every 5 years, or more frequently if you are over 41 years old.  Skin check.  Lung cancer screening. You may have this screening every year starting at age 37 if you have a 30-pack-year history of smoking and currently smoke or have quit within the past 15 years.  Fecal occult blood test (FOBT) of the stool. You may have this test every year starting at age 72.  Flexible sigmoidoscopy or colonoscopy. You may have a sigmoidoscopy every 5 years or a colonoscopy every 10 years starting at age 61.  Hepatitis C blood test.  Hepatitis B blood test.  Sexually transmitted disease (STD) testing.  Diabetes screening. This is done by checking your blood sugar (glucose) after you have not eaten for a while (fasting). You may have this done every 1-3 years.  Bone density scan. This is done to screen for osteoporosis. You may have this done starting at age 70.  Mammogram. This may be done every 1-2 years. Talk to your health care provider about how often you should have regular mammograms. Talk with your health care provider about your test results, treatment options, and if necessary, the need for more tests. Vaccines  Your health care provider may recommend certain vaccines,  such as:  Influenza vaccine. This is recommended every year.  Tetanus, diphtheria, and acellular pertussis (Tdap, Td) vaccine. You may need a Td booster every 10  years.  Zoster vaccine. You may need this after age 81.  Pneumococcal 13-valent conjugate (PCV13) vaccine. One dose is recommended after age 22.  Pneumococcal polysaccharide (PPSV23) vaccine. One dose is recommended after age 25. Talk to your health care provider about which screenings and vaccines you need and how often you need them. This information is not intended to replace advice given to you by your health care provider. Make sure you discuss any questions you have with your health care provider. Document Released: 06/30/2015 Document Revised: 02/21/2016 Document Reviewed: 04/04/2015 Elsevier Interactive Patient Education  2017 [place] [employer] Prevention in the Home Falls can cause injuries. They can happen to people of all ages. There are many things you can do to make your home safe and to help prevent falls. What can I do on the outside of my home?  Regularly fix the edges of walkways and driveways and fix any cracks.  Remove anything that might make you trip as you walk through a door, such as a raised step or threshold.  Trim any bushes or trees on the path to your home.  Use bright outdoor lighting.  Clear any walking paths of anything that might make someone trip, such as rocks or tools.  Regularly check to see if handrails are loose or broken. Make sure that both sides of any steps have handrails.  Any raised decks and porches should have guardrails on the edges.  Have any leaves, snow, or ice cleared regularly.  Use sand or salt on walking paths during winter.  Clean up any spills in your garage right away. This includes oil or grease spills. What can I do in the bathroom?  Use night lights.  Install grab bars by the toilet and in the tub and shower. Do not use towel bars as grab bars.  Use non-skid mats or decals in the tub or shower.  If you need to sit down in the shower, use a plastic, non-slip stool.  Keep the floor dry. Clean up any water that  spills on the floor as soon as it happens.  Remove soap buildup in the tub or shower regularly.  Attach bath mats securely with double-sided non-slip rug tape.  Do not have throw rugs and other things on the floor that can make you trip. What can I do in the bedroom?  Use night lights.  Make sure that you have a light by your bed that is easy to reach.  Do not use any sheets or blankets that are too big for your bed. They should not hang down onto the floor.  Have a firm chair that has side arms. You can use this for support while you get dressed.  Do not have throw rugs and other things on the floor that can make you trip. What can I do in the kitchen?  Clean up any spills right away.  Avoid walking on wet floors.  Keep items that you use a lot in easy-to-reach places.  If you need to reach something above you, use a strong step stool that has a grab bar.  Keep electrical cords out of the way.  Do not use floor polish or wax that makes floors slippery. If you must use wax, use non-skid floor wax.  Do not have throw rugs and other things on  the floor that can make you trip. What can I do with my stairs?  Do not leave any items on the stairs.  Make sure that there are handrails on both sides of the stairs and use them. Fix handrails that are broken or loose. Make sure that handrails are as long as the stairways.  Check any carpeting to make sure that it is firmly attached to the stairs. Fix any carpet that is loose or worn.  Avoid having throw rugs at the top or bottom of the stairs. If you do have throw rugs, attach them to the floor with carpet tape.  Make sure that you have a light switch at the top of the stairs and the bottom of the stairs. If you do not have them, ask someone to add them for you. What else can I do to help prevent falls?  Wear shoes that:  Do not have high heels.  Have rubber bottoms.  Are comfortable and fit you well.  Are closed at the  toe. Do not wear sandals.  If you use a stepladder:  Make sure that it is fully opened. Do not climb a closed stepladder.  Make sure that both sides of the stepladder are locked into place.  Ask someone to hold it for you, if possible.  Clearly mark and make sure that you can see:  Any grab bars or handrails.  First and last steps.  Where the edge of each step is.  Use tools that help you move around (mobility aids) if they are needed. These include:  Canes.  Walkers.  Scooters.  Crutches.  Turn on the lights when you go into a dark area. Replace any light bulbs as soon as they burn out.  Set up your furniture so you have a clear path. Avoid moving your furniture around.  If any of your floors are uneven, fix them.  If there are any pets around you, be aware of where they are.  Review your medicines with your doctor. Some medicines can make you feel dizzy. This can increase your chance of falling. Ask your doctor what other things that you can do to help prevent falls. This information is not intended to replace advice given to you by your health care provider. Make sure you discuss any questions you have with your health care provider. Document Released: 03/30/2009 Document Revised: 11/09/2015 Document Reviewed: 07/08/2014 Elsevier Interactive Patient Education  2017 [employer].

## 2020-05-18 NOTE — Progress Notes (Signed)
This visit occurred during the SARS-CoV-2 public health emergency.  Safety protocols were in place, including screening questions prior to the visit, additional usage of staff PPE, and extensive cleaning of exam room while observing appropriate contact time as indicated for disinfecting solutions.  Subjective:   Terri Barajas is a 74 y.o. female who presents for Medicare Annual (Subsequent) preventive examination.  Review of Systems     Cardiac Risk Factors include: advanced age (>60men, >62 women);dyslipidemia;hypertension;sedentary lifestyle     Objective:    Today's Vitals   05/18/20 0927 05/18/20 0934  BP: 138/80   Pulse: 83   Temp: (!) 97.4 F (36.3 C)   TempSrc: Oral   SpO2: 98%   Weight: 149 lb 6.4 oz (67.8 kg)   Height: 5' 2.8" (1.595 m)   PainSc:  10-Worst pain ever   Body mass index is 26.63 kg/m.  Advanced Directives 05/18/2020 09/28/2019 07/16/2018 07/06/2018  Does Patient Have a Medical Advance Directive? Yes No No No  Type of Paramedic of [place];Living will - - -  Would patient like information on creating a medical advance directive? - - No - Patient declined No - Patient declined    Current Medications (verified) Outpatient Encounter Medications as of 05/18/2020  Medication Sig   ALPRAZolam (XANAX) 0.5 MG tablet Take 0.5 tablets (0.25 mg total) by mouth daily as needed for anxiety.   aspirin 81 MG chewable tablet Chew 81 mg by mouth daily.   benzonatate (TESSALON) 200 MG capsule Take 1 capsule (200 mg total) by mouth 2 (two) times daily as needed for cough.   BIOTIN PO Take 1 tablet by mouth daily.    brimonidine (ALPHAGAN) 0.2 % ophthalmic solution Place 1 drop into both eyes in the morning and at bedtime.   Cholecalciferol (VITAMIN D3) 25 MCG (1000 UT) CAPS Take 1,000 Units by mouth daily.   DEXILANT 60 MG capsule TAKE 1 CAPSULE BY MOUTH  DAILY   diltiazem (CARDIZEM) 30 MG tablet Take 1 tablet (30 mg total) by mouth 2  (two) times daily.   diphenhydrAMINE (BENADRYL) 25 MG tablet Take 25 mg by mouth as needed.   dorzolamide-timolol (COSOPT) 22.3-6.8 MG/ML ophthalmic solution Place 1 drop into both eyes 2 (two) times daily.   fluticasone (FLONASE) 50 MCG/ACT nasal spray Place 2 sprays into both nostrils daily.   hydrochlorothiazide (HYDRODIURIL) 25 MG tablet TAKE 1 TABLET BY MOUTH  DAILY   ibuprofen (ADVIL) 800 MG tablet Take 1 tablet (800 mg total) by mouth 3 (three) times daily.   latanoprost (XALATAN) 0.005 % ophthalmic solution Place 1 drop into both eyes at bedtime.    Multiple Vitamins-Minerals (CENTRUM SILVER 50+WOMEN PO) Take 1 tablet by mouth daily.   Omega-3 Fatty Acids (FISH OIL PO) Take 1 capsule by mouth daily.   polycarbophil (FIBERCON) 625 MG tablet Take 625 mg by mouth daily.   polyethylene glycol (MIRALAX / GLYCOLAX) packet Take 17 g by mouth daily.   tiZANidine (ZANAFLEX) 2 MG tablet Take 1 tablet (2 mg total) by mouth every 8 (eight) hours as needed for muscle spasms.   EPINEPHrine 0.3 mg/0.3 mL IJ SOAJ injection Inject 0.3 mg into the muscle as needed. (Patient not taking: Reported on 05/05/2020)   No facility-administered encounter medications on file as of 05/18/2020.    Allergies (verified) Atracurium & derivatives, Atropine, Iodine, Shellfish allergy, and Statins   History: Past Medical History:  Diagnosis Date   Anxiety    Arthritis    Complication of  anesthesia    some type of sedation medication made her feel crazy   Dysrhythmia    Family history of adverse reaction to anesthesia    daughter has Malignant Hyperthermia hx -    GERD (gastroesophageal reflux disease)    Glaucoma    H/O: hysterectomy 1978   Heart disease    History of hiatal hernia    Hypertension    MVP (mitral valve prolapse)    Panic attacks    Pre-diabetes    Sciatica    Past Surgical History:  Procedure Laterality Date   APPENDECTOMY  1972   BIOPSY  07/06/2018    Procedure: BIOPSY;  Surgeon: Irving Copas., MD;  Location: [place];  Service: Gastroenterology;;   CATARACT EXTRACTION  2013   [place] and 1978   COLONOSCOPY     ESOPHAGOGASTRODUODENOSCOPY (EGD) WITH PROPOFOL N/A 07/06/2018   Procedure: ESOPHAGOGASTRODUODENOSCOPY (EGD) WITH PROPOFOL;  Surgeon: Irving Copas., MD;  Location: [place];  Service: Gastroenterology;  Laterality: N/A;   HAND SURGERY Bilateral    HEMORRHOID SURGERY  2018   S/P Hysterectomy   1987   Family History  Problem Relation Age of Onset   Pancreatic cancer Mother    Alcohol abuse Father    Asthma Sister    Hypertension Sister    Arthritis Sister    Diabetes Brother    Arthritis Brother    Hyperlipidemia Brother    Hypertension Brother    Glaucoma Brother    Colon polyps Brother    Arthritis Sister    Arthritis Sister    Prostate cancer Brother    Congenital heart disease Brother    Colon cancer Neg Hx    Esophageal cancer Neg Hx    Inflammatory bowel disease Neg Hx    Liver disease Neg Hx    Rectal cancer Neg Hx    Stomach cancer Neg Hx    Social History   Socioeconomic History   Marital status: Married    Spouse name: Not on file   Number of children: Not on file   Years of education: 12   Highest education level: Not on file  Occupational History   Occupation: retired  Tobacco Use   Smoking status: Never Smoker   Smokeless tobacco: Never Used  Scientific laboratory technician Use: Never used  Substance and Sexual Activity   Alcohol use: Not Currently   Drug use: Not Currently   Sexual activity: Yes    Partners: Male  Other Topics Concern   Not on file  Social History Narrative   Not on file   Social Determinants of Health   Financial Resource Strain: Low Risk    Difficulty of Paying Living Expenses: Not hard at all  Food Insecurity: No Food Insecurity   Worried About Charity fundraiser in the Last Year: Never  true   [place] in the Last Year: Never true  Transportation Needs: No Transportation Needs   Lack of Transportation (Medical): No   Lack of Transportation (Non-Medical): No  Physical Activity: Inactive   Days of Exercise per Week: 0 days   Minutes of Exercise per Session: 0 min  Stress: No Stress Concern Present   Feeling of Stress : Not at all  Social Connections:    Frequency of Communication with Friends and Family: Not on file   Frequency of Social Gatherings with Friends and Family: Not on file   Attends Religious Services: Not on file  Active Member of Clubs or Organizations: Not on file   Attends Archivist Meetings: Not on file   Marital Status: Not on file    Tobacco Counseling Counseling given: Not Answered   Clinical Intake:  Pre-visit preparation completed: Yes  Pain : 0-10 Pain Score: 10-Worst pain ever Pain Type: Acute pain Pain Location: Leg Pain Orientation: Left Pain Descriptors / Indicators: Shooting Pain Onset: 1 to 4 weeks ago Pain Frequency: Constant     Nutritional Status: BMI 25 -29 Overweight Nutritional Risks: None Diabetes: No  How often do you need to have someone help you when you read instructions, pamphlets, or other written materials from your doctor or pharmacy?: 1 - Never What is the last grade level you completed in school?: 12th grade  Diabetic? no  Interpreter Needed?: No  Information entered by :: NAllen LPN   Activities of Daily Living In your present state of health, do you have any difficulty performing the following activities: 05/18/2020 07/12/2019  Hearing? N N  Vision? N N  Difficulty concentrating or making decisions? N N  Walking or climbing stairs? Y N  Comment due to leg pain -  Dressing or bathing? N N  Doing errands, shopping? N N  Preparing Food and eating ? N -  Using the Toilet? N -  In the past six months, have you accidently leaked urine? Y -  Comment with sneezing or  cough -  Do you have problems with loss of bowel control? N -  Managing your Medications? N -  Managing your Finances? N -  Housekeeping or managing your Housekeeping? N -  Some recent data might be hidden    Patient Care Team: [name], FNP as PCP - General ([place]) Skeet Latch, MD as PCP - Cardiology (Cardiology) Rossie Muskrat, MD as Referring Physician (Podiatry)  Indicate any recent Medical Services you may have received from other than [facility] providers in the past year (date may be approximate).     Assessment:   This is a routine wellness examination for [place].  Hearing/Vision screen  Hearing Screening   125Hz  250Hz  500Hz  1000Hz  2000Hz  3000Hz  4000Hz  6000Hz  8000Hz   Right ear:           Left ear:           Vision Screening Comments: Regular eye exams, Dr. Genevie Ann  Dietary issues and exercise activities discussed: Current Exercise Habits: The patient does not participate in regular exercise at present  Goals     Patient Stated     05/18/2020, get leg better      Depression Screen PHQ 2/9 Scores 05/18/2020 11/04/2019 07/12/2019 11/23/2018 07/21/2018 07/16/2018 05/27/2018  PHQ - 2 Score 2 0 4 0 4 0 0  PHQ- 9 Score - - 7 0 6 - 1    Fall Risk Fall Risk  05/18/2020 11/04/2019 11/23/2018 01/14/2018  Falls in the past year? 0 0 0 No  Number falls in past yr: 0 - 0 -  Injury with Fall? - - 0 -  Risk for fall due to : Impaired mobility;Impaired balance/gait;Medication side effect - - -  Follow up Falls evaluation completed;Education provided;Falls prevention discussed - - -    Any stairs in or around the home? No  If so, are there any without handrails? n/a Home free of loose throw rugs in walkways, pet beds, electrical cords, etc? Yes  Adequate lighting in your home to reduce risk of falls? Yes   ASSISTIVE DEVICES UTILIZED TO  PREVENT FALLS:  Life alert? No  Use of a cane, walker or w/c? Yes  Grab bars in the bathroom? No  Shower chair or bench in shower?  No  Elevated toilet seat or a handicapped toilet? No   TIMED UP AND GO:  Was the test performed? No .     Gait slow and steady with assistive device  Cognitive Function:     6CIT Screen 05/18/2020  What Year? 0 points  What month? 0 points  What time? 0 points  Count back from 20 0 points  Months in reverse 0 points  Repeat phrase 4 points  Total Score 4    Immunizations Immunization History  Administered Date(s) Administered   Fluad Quad(high Dose 65+) 03/11/2019   Influenza, High Dose Seasonal PF 04/29/2018   Influenza-Unspecified 03/16/2020   PFIZER SARS-COV-2 Vaccination 09/02/2019, 09/28/2019   Pneumococcal Conjugate-13 03/11/2019   Pneumococcal Polysaccharide-23 06/09/2017    TDAP status: Due, Education has been provided regarding the importance of this vaccine. Advised may receive this vaccine at local pharmacy or Health Dept. Aware to provide a copy of the vaccination record if obtained from local pharmacy or Health Dept. Verbalized acceptance and understanding. Flu Vaccine status: Up to date Pneumococcal vaccine status: Up to date Covid-19 vaccine status: Completed vaccines  Qualifies for Shingles Vaccine? Yes   Zostavax completed No   Shingrix Completed?: Yes per patient  Screening Tests Health Maintenance  Topic Date Due   TETANUS/TDAP  07/11/2020 (Originally 10/21/1964)   MAMMOGRAM  02/08/2022   COLONOSCOPY  03/29/2025   INFLUENZA VACCINE  Completed   DEXA SCAN  Completed   COVID-19 Vaccine  Completed   Hepatitis C Screening  Completed   PNA vac Low Risk Adult  Completed    Health Maintenance  There are no preventive care reminders to display for this patient.  Colorectal cancer screening: Completed 03/30/2015. Repeat every 10 years Mammogram status: Completed 02/09/2020. Repeat every year Bone Density status: Completed 02/09/2020. Results reflect: Bone density results: NORMAL. Repeat every 0 years.  Lung Cancer Screening: (Low Dose  CT Chest recommended if Age 6-80 years, 30 pack-year currently smoking OR have quit w/in 15years.) does not qualify.   Lung Cancer Screening Referral: no  Additional Screening:  Hepatitis C Screening: does qualify; Completed 05/27/2018  Vision Screening: Recommended annual ophthalmology exams for early detection of glaucoma and other disorders of the eye. Is the patient up to date with their annual eye exam?  Yes  Who is the provider or what is the name of the office in which the patient attends annual eye exams? Dr. Genevie Ann If pt is not established with a provider, would they like to be referred to a provider to establish care? No .   Dental Screening: Recommended annual dental exams for proper oral hygiene  Community Resource Referral / Chronic Care Management: CRR required this visit?  No   CCM required this visit?  No      Plan:     I have personally reviewed and noted the following in the patients chart:    Medical and social history  Use of alcohol, tobacco or illicit drugs   Current medications and supplements  Functional ability and status  Nutritional status  Physical activity  Advanced directives  List of other physicians  Hospitalizations, surgeries, and ER visits in previous 12 months  Vitals  Screenings to include cognitive, depression, and falls  Referrals and appointments  In addition, I have reviewed and discussed with patient certain  preventive protocols, quality metrics, and best practice recommendations. A written personalized care plan for preventive services as well as general preventive health recommendations were provided to patient.     Kellie Simmering, LPN   [id number]   Nurse Notes:

## 2020-05-20 ENCOUNTER — Emergency Department (HOSPITAL_COMMUNITY): Payer: Medicare Other

## 2020-05-20 ENCOUNTER — Other Ambulatory Visit: Payer: Self-pay

## 2020-05-20 ENCOUNTER — Encounter (HOSPITAL_COMMUNITY): Payer: Self-pay | Admitting: Emergency Medicine

## 2020-05-20 ENCOUNTER — Emergency Department (HOSPITAL_COMMUNITY)
Admission: EM | Admit: 2020-05-20 | Discharge: 2020-05-20 | Disposition: A | Discharge status: Home / Self Care | Payer: Medicare Other | Attending: Emergency Medicine | Admitting: Emergency Medicine

## 2020-05-20 DIAGNOSIS — Z79899 Other long term (current) drug therapy: Secondary | ICD-10-CM | POA: Diagnosis not present

## 2020-05-20 DIAGNOSIS — M545 Low back pain, unspecified: Secondary | ICD-10-CM | POA: Diagnosis not present

## 2020-05-20 DIAGNOSIS — M5442 Lumbago with sciatica, left side: Secondary | ICD-10-CM | POA: Insufficient documentation

## 2020-05-20 DIAGNOSIS — Z7982 Long term (current) use of aspirin: Secondary | ICD-10-CM | POA: Diagnosis not present

## 2020-05-20 DIAGNOSIS — M5432 Sciatica, left side: Secondary | ICD-10-CM

## 2020-05-20 DIAGNOSIS — I10 Essential (primary) hypertension: Secondary | ICD-10-CM | POA: Diagnosis not present

## 2020-05-20 MED ORDER — KETOROLAC TROMETHAMINE 60 MG/2ML IM SOLN
30.0000 mg | Freq: Once | INTRAMUSCULAR | Status: AC
  Administered 2020-05-20: 30 mg via INTRAMUSCULAR
  Filled 2020-05-20: qty 2

## 2020-05-20 MED ORDER — OXYCODONE-ACETAMINOPHEN 5-325 MG PO TABS
1.0000 | ORAL_TABLET | Freq: Four times a day (QID) | ORAL | 0 refills | Status: AC | PRN
Start: 2020-05-20 — End: 2020-05-23

## 2020-05-20 MED ORDER — OXYCODONE-ACETAMINOPHEN 5-325 MG PO TABS
1.0000 | ORAL_TABLET | Freq: Once | ORAL | Status: AC
  Administered 2020-05-20: 1 via ORAL
  Filled 2020-05-20: qty 1

## 2020-05-20 MED ORDER — CYCLOBENZAPRINE HCL 10 MG PO TABS: 10.0000 mg | ORAL_TABLET | Freq: Two times a day (BID) | ORAL | 0 refills | Status: DC | PRN

## 2020-05-20 NOTE — Discharge Instructions (Signed)
Take steroids for your back pain.  For breakthrough pain recommend taking the Percocet.  Take Flexeril as needed as well.  Note the Flexeril and Percocet can make you drowsy and should not be taken while driving or operating heavy machinery.  If you develop worsening numbness, any weakness, bladder or bowel incontinence, urinary retention, fever or other new concerning symptoms return to ER for reassessment.

## 2020-05-20 NOTE — ED Provider Notes (Signed)
[facility] EMERGENCY DEPARTMENT Provider Note   CSN: [id number] Arrival date & time: 05/20/20  3762     History Chief Complaint  Patient presents with  . Back Pain    Terri Barajas is a 74 y.o. female.  Presented to ER with concern for back pain.  Reports back pain started a few weeks ago, primarily lower back radiating down left side.  Relatively constant but worsening over the last few days.  Had improved after receiving NSAIDs and muscle relaxer from urgent care.  Pain is up to 10-10 in severity, worse with walking but is able to walk without assistance.  Has tingling sensation on left leg but no weakness or numbness.  No bladder or bowel incontinence, no saddle anesthesia.  No urinary retention, no fever.  No falls.  Denies prior back history, no IVDU.  HPI     Past Medical History:  Diagnosis Date  . Anxiety   . Arthritis   . Complication of anesthesia    some type of sedation medication made her feel crazy  . Dysrhythmia   . Family history of adverse reaction to anesthesia    daughter has Malignant Hyperthermia hx -   . GERD (gastroesophageal reflux disease)   . Glaucoma   . H/O: hysterectomy 1978  . Heart disease   . History of hiatal hernia   . Hypertension   . MVP (mitral valve prolapse)   . Panic attacks   . Pre-diabetes   . Sciatica     Patient Active Problem List   Diagnosis Date Noted  . OSA on CPAP 01/06/2019  . Abnormal CT of the abdomen 10/04/2018  . Radiculopathy affecting upper extremity, left 07/25/2018  . Hyperlipidemia 05/13/2018  . Situational anxiety 04/29/2018  . MVP (mitral valve prolapse) 04/29/2018  . Sleep-disordered breathing 04/29/2018  . Family History of Hyperthermia, malignant 04/23/2018  . Barrett's esophagus 03/01/2018  . Elevated lipase 02/18/2018  . Hiatal hernia 02/18/2018  . Constipation 02/18/2018  . Bloating 02/18/2018  . Aortic atherosclerosis ([place]) 01/23/2018  . Gastroesophageal reflux disease  without esophagitis   . Glaucoma   . Hypertension     Past Surgical History:  Procedure Laterality Date  . APPENDECTOMY  1972  . BIOPSY  07/06/2018   Procedure: BIOPSY;  Surgeon: Rush Landmark Telford Nab., MD;  Location: [place];  Service: Gastroenterology;;  . CATARACT EXTRACTION  2013  . [place]  . COLONOSCOPY    . ESOPHAGOGASTRODUODENOSCOPY (EGD) WITH PROPOFOL N/A 07/06/2018   Procedure: ESOPHAGOGASTRODUODENOSCOPY (EGD) WITH PROPOFOL;  Surgeon: Rush Landmark Telford Nab., MD;  Location: [place];  Service: Gastroenterology;  Laterality: N/A;  . HAND SURGERY Bilateral   . HEMORRHOID SURGERY  2018  . S/P Hysterectomy   1987     OB History   No obstetric history on file.     Family History  Problem Relation Age of Onset  . Pancreatic cancer Mother   . Alcohol abuse Father   . Asthma Sister   . Hypertension Sister   . Arthritis Sister   . Diabetes Brother   . Arthritis Brother   . Hyperlipidemia Brother   . Hypertension Brother   . Glaucoma Brother   . Colon polyps Brother   . Arthritis Sister   . Arthritis Sister   . Prostate cancer Brother   . Congenital heart disease Brother   . Colon cancer Neg Hx   . Esophageal cancer Neg Hx   . Inflammatory bowel disease Neg  Hx   . Liver disease Neg Hx   . Rectal cancer Neg Hx   . Stomach cancer Neg Hx     Social History   Tobacco Use  . Smoking status: Never Smoker  . Smokeless tobacco: Never Used  Vaping Use  . Vaping Use: Never used  Substance Use Topics  . Alcohol use: Not Currently  . Drug use: Not Currently    Home Medications Prior to Admission medications   Medication Sig Start Date End Date Taking? Authorizing Provider  ALPRAZolam Duanne Moron) 0.5 MG tablet Take 0.5 tablets (0.25 mg total) by mouth daily as needed for anxiety. 04/01/19   Briscoe Deutscher, DO  aspirin 81 MG chewable tablet Chew 81 mg by mouth daily.    [provider]  benzonatate (TESSALON) 200 MG capsule  Take 1 capsule (200 mg total) by mouth 2 (two) times daily as needed for cough. 04/08/20   Mannam, Hart Robinsons, MD  BIOTIN PO Take 1 tablet by mouth daily.     [provider]  brimonidine (ALPHAGAN) 0.2 % ophthalmic solution Place 1 drop into both eyes in the morning and at bedtime.    [provider]  Cholecalciferol (VITAMIN D3) 25 MCG (1000 UT) CAPS Take 1,000 Units by mouth daily.    [provider]  cyclobenzaprine (FLEXERIL) 10 MG tablet Take 1 tablet (10 mg total) by mouth 2 (two) times daily as needed for muscle spasms. 05/20/20   Lucrezia Starch, MD  DEXILANT 60 MG capsule TAKE 1 CAPSULE BY MOUTH  DAILY 07/29/19   Mansouraty, Telford Nab., MD  diltiazem (CARDIZEM) 30 MG tablet Take 1 tablet (30 mg total) by mouth 2 (two) times daily. 09/17/19   Skeet Latch, MD  diphenhydrAMINE (BENADRYL) 25 MG tablet Take 25 mg by mouth as needed.    [provider]  dorzolamide-timolol (COSOPT) 22.3-6.8 MG/ML ophthalmic solution Place 1 drop into both eyes 2 (two) times daily. 11/16/19   [provider]  EPINEPHrine 0.3 mg/0.3 mL IJ SOAJ injection Inject 0.3 mg into the muscle as needed. Patient not taking: Reported on 05/05/2020 12/21/19   [provider]  fluticasone (FLONASE) 50 MCG/ACT nasal spray Place 2 sprays into both nostrils daily. 02/15/20   Mannam, Hart Robinsons, MD  hydrochlorothiazide (HYDRODIURIL) 25 MG tablet TAKE 1 TABLET BY MOUTH  DAILY 03/13/20   [name], FNP  ibuprofen (ADVIL) 800 MG tablet Take 1 tablet (800 mg total) by mouth 3 (three) times daily. 05/04/20   Tasia Catchings, Amy V, PA-C  latanoprost (XALATAN) 0.005 % ophthalmic solution Place 1 drop into both eyes at bedtime.     [provider]  Multiple Vitamins-Minerals (CENTRUM SILVER 50+WOMEN PO) Take 1 tablet by mouth daily.    [provider]  Omega-3 Fatty Acids (FISH OIL PO) Take 1 capsule by mouth daily.    [provider]  oxyCODONE-acetaminophen  (PERCOCET/ROXICET) 5-325 MG tablet Take 1 tablet by mouth every 6 (six) hours as needed for up to 3 days for severe pain. 05/20/20 05/23/20  Lucrezia Starch, MD  polycarbophil (FIBERCON) 625 MG tablet Take 625 mg by mouth daily.    [provider]  polyethylene glycol (MIRALAX / GLYCOLAX) packet Take 17 g by mouth daily.    [provider]  tiZANidine (ZANAFLEX) 2 MG tablet Take 1 tablet (2 mg total) by mouth every 8 (eight) hours as needed for muscle spasms. 05/04/20   Ok Edwards, PA-C    Allergies    Atracurium & derivatives,  Atropine, Iodine, Shellfish allergy, and Statins  Review of Systems   Review of Systems  Constitutional: Negative for chills and fever.  HENT: Negative for ear pain and sore throat.   Eyes: Negative for pain and visual disturbance.  Respiratory: Negative for cough and shortness of breath.   Cardiovascular: Negative for chest pain and palpitations.  Gastrointestinal: Negative for abdominal pain and vomiting.  Genitourinary: Negative for dysuria and hematuria.  Musculoskeletal: Positive for back pain. Negative for arthralgias.  Skin: Negative for color change and rash.  Neurological: Negative for seizures and syncope.  All other systems reviewed and are negative.   Physical Exam Updated Vital Signs BP 112/84   Pulse 87   Temp 98 F (36.7 C) (Temporal)   Resp 18   LMP  (LMP Unknown)   SpO2 100%   Physical Exam Vitals and nursing note reviewed.  Constitutional:      General: She is not in acute distress.    Appearance: She is well-developed.  HENT:     Head: Normocephalic and atraumatic.  Eyes:     Conjunctiva/sclera: Conjunctivae normal.  Cardiovascular:     Rate and Rhythm: Normal rate and regular rhythm.     Heart sounds: No murmur heard.   Pulmonary:     Effort: Pulmonary effort is normal. No respiratory distress.     Breath sounds: Normal breath sounds.  Abdominal:     Palpations: Abdomen is soft.     Tenderness: There is  no abdominal tenderness.  Musculoskeletal:     Cervical back: Neck supple.     Comments: Some mild tenderness in lower lumbar region, no focal bony tenderness, no rebound or guarding  Skin:    General: Skin is warm and dry.  Neurological:     General: No focal deficit present.     Mental Status: She is alert.     Comments: 5 out of 5 strength in bilateral lower extremities, sensation to light touch intact in bilateral lower extremities     ED Results / Procedures / Treatments   Labs (all labs ordered are listed, but only abnormal results are displayed) Labs Reviewed - No data to display  EKG None  Radiology DG Lumbar Spine Complete  Result Date: 05/20/2020 CLINICAL DATA:  Severe low back pain and pelvis pain. EXAM: LUMBAR SPINE - COMPLETE 4+ VIEW COMPARISON:  None. FINDINGS: There is no evidence of lumbar spine fracture. Alignment is normal. Intervertebral disc spaces are maintained. IMPRESSION: Negative. Electronically Signed   By: Dorise Bullion III M.D   On: 05/20/2020 14:49   DG Pelvis 1-2 Views  Result Date: 05/20/2020 CLINICAL DATA:  Severe low back pain.  Left pelvis pain. EXAM: PELVIS - 1-2 VIEW COMPARISON:  None. FINDINGS: There is no evidence of pelvic fracture or diastasis. No pelvic bone lesions are seen. IMPRESSION: Negative. Electronically Signed   By: Dorise Bullion III M.D   On: 05/20/2020 14:50    Procedures Procedures (including critical care time)  Medications Ordered in ED Medications  oxyCODONE-acetaminophen (PERCOCET/ROXICET) 5-325 MG per tablet 1 tablet (1 tablet Oral Given 05/20/20 1511)  ketorolac (TORADOL) injection 30 mg (30 mg Intramuscular Given 05/20/20 1511)    ED Course  I have reviewed the triage vital signs and the nursing notes.  Pertinent labs & imaging results that were available during my care of the patient were reviewed by me and considered in my medical decision making (see chart for details).    MDM Rules/Calculators/A&P  74 year old lady presents to ER with concern for back pain.  Based on description of symptoms, suspect sciatica.  Neurovascularly intact, well-appearing, symptoms well controlled in ER.  Plain films negative.  No red flag symptoms.  Recommend she follow-up both with spine surgery as well as her primary doctor.  Will provide course of steroids as well as very short course of Percocet for breakthrough pain.  Reviewed return precautions and discharged.    After the discussed management above, the patient was determined to be safe for discharge.  The patient was in agreement with this plan and all questions regarding their care were answered.  ED return precautions were discussed and the patient will return to the ED with any significant worsening of condition.   Final Clinical Impression(s) / ED Diagnoses Final diagnoses:  Sciatica of left side    Rx / DC Orders ED Discharge Orders         Ordered    oxyCODONE-acetaminophen (PERCOCET/ROXICET) 5-325 MG tablet  Every 6 hours PRN        05/20/20 1546    cyclobenzaprine (FLEXERIL) 10 MG tablet  2 times daily PRN        05/20/20 1547           Lucrezia Starch, MD 05/22/20 [phone]

## 2020-05-20 NOTE — ED Triage Notes (Signed)
Pt reports lower back pain that radiates down L leg.  States she was seen at [facility] on 11/18 for same and symptoms started approx 1 week prior to that.  States [facility] gave her Rx for Ibuprofen and Zanaflex.  States pain is better with medication but she has ran out (states 1 pill left of both that she was "hanging on to" in case of severe pain).

## 2020-05-24 ENCOUNTER — Encounter: Payer: Self-pay | Admitting: Nurse Practitioner

## 2020-05-24 ENCOUNTER — Other Ambulatory Visit: Payer: Self-pay

## 2020-05-24 ENCOUNTER — Other Ambulatory Visit: Payer: Self-pay | Admitting: Pulmonary Disease

## 2020-05-24 ENCOUNTER — Ambulatory Visit (INDEPENDENT_AMBULATORY_CARE_PROVIDER_SITE_OTHER): Payer: Medicare Other | Admitting: Nurse Practitioner

## 2020-05-24 VITALS — BP 120/84 | HR 95 | Temp 98.1°F | Ht 62.8 in | Wt 149.8 lb

## 2020-05-24 DIAGNOSIS — M5432 Sciatica, left side: Secondary | ICD-10-CM

## 2020-05-24 MED ORDER — PREDNISONE 20 MG PO TABS: ORAL_TABLET | ORAL | 0 refills | Status: DC

## 2020-05-24 MED ORDER — OXYCODONE-ACETAMINOPHEN 10-325 MG PO TABS: 1.0000 | ORAL_TABLET | ORAL | 0 refills | Status: DC | PRN

## 2020-05-24 MED ORDER — TRIAMCINOLONE ACETONIDE 40 MG/ML IJ SUSP
40.0000 mg | Freq: Once | INTRAMUSCULAR | Status: AC
  Administered 2020-05-24: 40 mg via INTRAMUSCULAR

## 2020-05-24 NOTE — Progress Notes (Signed)
I,Yamilka Roman [employer] as a Education administrator for [employer], FNP.,have documented all relevant documentation on the behalf of [name], FNP,as directed by  [name], FNP while in the presence of [name], [place]. This visit occurred during the SARS-CoV-2 public health emergency.  Safety protocols were in place, including screening questions prior to the visit, additional usage of staff PPE, and extensive cleaning of exam room while observing appropriate contact time as indicated for disinfecting solutions.  Subjective:     Patient ID: Terri Barajas , female    DOB: 07/07/1945 , 74 y.o.   MRN: [id number]   Chief Complaint  Patient presents with  . Leg Pain    patient stated she has been having pain in her leg for 3 weeks now     HPI  She has been dealing with left sciatic nerve pain for the last 3 weeks she has been to the Urgent care and ER treated with ibuprofen, muscle relaxer, percocet with some relief but is just not getting better.  No changes in pain. She is using a crutch to get around. She has not been doing any stretching exercises due to the pain.      Past Medical History:  Diagnosis Date  . Anxiety   . Arthritis   . Complication of anesthesia    some type of sedation medication made her feel crazy  . Dysrhythmia   . Family history of adverse reaction to anesthesia    daughter has Malignant Hyperthermia hx -   . GERD (gastroesophageal reflux disease)   . Glaucoma   . H/O: hysterectomy 1978  . Heart disease   . History of hiatal hernia   . Hypertension   . MVP (mitral valve prolapse)   . Panic attacks   . Pre-diabetes   . Sciatica      Family History  Problem Relation Age of Onset  . Pancreatic cancer Mother   . Alcohol abuse Father   . Asthma Sister   . Hypertension Sister   . Arthritis Sister   . Diabetes Brother   . Arthritis Brother   . Hyperlipidemia Brother   . Hypertension Brother   . Glaucoma Brother   . Colon polyps Brother   .  Arthritis Sister   . Arthritis Sister   . Prostate cancer Brother   . Congenital heart disease Brother   . Colon cancer Neg Hx   . Esophageal cancer Neg Hx   . Inflammatory bowel disease Neg Hx   . Liver disease Neg Hx   . Rectal cancer Neg Hx   . Stomach cancer Neg Hx      Current Outpatient Medications:  .  ALPRAZolam (XANAX) 0.5 MG tablet, Take 0.5 tablets (0.25 mg total) by mouth daily as needed for anxiety., Disp: 45 tablet, Rfl: 0 .  aspirin 81 MG chewable tablet, Chew 81 mg by mouth daily., Disp: , Rfl:  .  benzonatate (TESSALON) 200 MG capsule, TAKE 1 CAPSULE BY MOUTH 2 TIMES DAILY AS NEEDED FOR COUGH., Disp: 30 capsule, Rfl: 1 .  BIOTIN PO, Take 1 tablet by mouth daily. , Disp: , Rfl:  .  brimonidine (ALPHAGAN) 0.2 % ophthalmic solution, Place 1 drop into both eyes in the morning and at bedtime., Disp: , Rfl:  .  Cholecalciferol (VITAMIN D3) 25 MCG (1000 UT) CAPS, Take 1,000 Units by mouth daily., Disp: , Rfl:  .  cyclobenzaprine (FLEXERIL) 10 MG tablet, Take 1 tablet (10 mg total) by mouth 2 (two) times  daily as needed for muscle spasms., Disp: 20 tablet, Rfl: 0 .  DEXILANT 60 MG capsule, TAKE 1 CAPSULE BY MOUTH  DAILY, Disp: 90 capsule, Rfl: 3 .  diltiazem (CARDIZEM) 30 MG tablet, Take 1 tablet (30 mg total) by mouth 2 (two) times daily., Disp: 180 tablet, Rfl: 3 .  diphenhydrAMINE (BENADRYL) 25 MG tablet, Take 25 mg by mouth as needed., Disp: , Rfl:  .  dorzolamide-timolol (COSOPT) 22.3-6.8 MG/ML ophthalmic solution, Place 1 drop into both eyes 2 (two) times daily., Disp: , Rfl:  .  EPINEPHrine 0.3 mg/0.3 mL IJ SOAJ injection, Inject 0.3 mg into the muscle as needed. , Disp: , Rfl:  .  fluticasone (FLONASE) 50 MCG/ACT nasal spray, Place 2 sprays into both nostrils daily., Disp: 16 g, Rfl: 2 .  hydrochlorothiazide (HYDRODIURIL) 25 MG tablet, TAKE 1 TABLET BY MOUTH  DAILY, Disp: 90 tablet, Rfl: 3 .  ibuprofen (ADVIL) 800 MG tablet, Take 1 tablet (800 mg total) by mouth 3 (three)  times daily., Disp: 21 tablet, Rfl: 0 .  latanoprost (XALATAN) 0.005 % ophthalmic solution, Place 1 drop into both eyes at bedtime. , Disp: , Rfl:  .  Multiple Vitamins-Minerals (CENTRUM SILVER 50+WOMEN PO), Take 1 tablet by mouth daily., Disp: , Rfl:  .  Omega-3 Fatty Acids (FISH OIL PO), Take 1 capsule by mouth daily., Disp: , Rfl:  .  polycarbophil (FIBERCON) 625 MG tablet, Take 625 mg by mouth daily., Disp: , Rfl:  .  polyethylene glycol (MIRALAX / GLYCOLAX) packet, Take 17 g by mouth daily., Disp: , Rfl:  .  tiZANidine (ZANAFLEX) 2 MG tablet, Take 1 tablet (2 mg total) by mouth every 8 (eight) hours as needed for muscle spasms., Disp: 15 tablet, Rfl: 0 .  oxyCODONE-acetaminophen (PERCOCET) 10-325 MG tablet, Take 1 tablet by mouth every 4 (four) hours as needed for pain., Disp: 20 tablet, Rfl: 0 .  predniSONE (DELTASONE) 20 MG tablet, Take 2 tablets by mouth once a day in am x 3 days, Disp: 6 tablet, Rfl: 0   Allergies  Allergen Reactions  . Atracurium & Derivatives Other (See Comments)    Unknown  . Atropine Other (See Comments)    Heart racing  . Iodine Other (See Comments)    Unknown  . Shellfish Allergy Other (See Comments)    Unknown  . Statins     "didn't feel right"     Review of Systems  Constitutional: Negative.   Respiratory: Negative.   Cardiovascular: Negative.  Negative for chest pain, palpitations and leg swelling.  Musculoskeletal:       Left hip painful when sitting on the hip  Psychiatric/Behavioral: Negative.      Today's Vitals   05/24/20 1632  BP: 120/84  Pulse: 95  Temp: 98.1 F (36.7 C)  TempSrc: Oral  Weight: 149 lb 12.8 oz (67.9 kg)  Height: 5' 2.8" (1.595 m)  PainSc: 8   PainLoc: Leg   Body mass index is 26.71 kg/m.   Objective:  Physical Exam Vitals reviewed.  Constitutional:      General: She is not in acute distress.    Appearance: Normal appearance.  Cardiovascular:     Rate and Rhythm: Normal rate and regular rhythm.      Pulses: Normal pulses.     Heart sounds: Normal heart sounds. No murmur heard.   Pulmonary:     Effort: Pulmonary effort is normal. No respiratory distress.     Breath sounds: Normal breath sounds.  Skin:  Capillary Refill: Capillary refill takes less than 2 seconds.  Neurological:     General: No focal deficit present.     Mental Status: She is alert and oriented to person, place, and time.     Cranial Nerves: No cranial nerve deficit.  Psychiatric:        Mood and Affect: Mood normal.        Behavior: Behavior normal.        Thought Content: Thought content normal.        Judgment: Judgment normal.         Assessment And Plan:     1. Left sciatic nerve pain  Unable to illicit pain on palpation but she is sitting on her right side  Will treat with steroid injection and a short course of prednisone to start tomorrow  If not better by Friday she is to call office - triamcinolone acetonide (KENALOG-40) injection 40 mg - predniSONE (DELTASONE) 20 MG tablet; Take 2 tablets by mouth once a day in am x 3 days  Dispense: 6 tablet; Refill: 0 - oxyCODONE-acetaminophen (PERCOCET) 10-325 MG tablet; Take 1 tablet by mouth every 4 (four) hours as needed for pain.  Dispense: 20 tablet; Refill: 0     Patient was given opportunity to ask questions. Patient verbalized understanding of the plan and was able to repeat key elements of the plan. All questions were answered to their satisfaction.    Teola Bradley, FNP, have reviewed all documentation for this visit. The documentation on 05/24/20 for the exam, diagnosis, procedures, and orders are all accurate and complete.  THE PATIENT IS ENCOURAGED TO PRACTICE SOCIAL DISTANCING DUE TO THE COVID-19 PANDEMIC.

## 2020-05-24 NOTE — Patient Instructions (Signed)
Sciatica Rehab Ask your health care provider which exercises are safe for you. Do exercises exactly as told by your health care provider and adjust them as directed. It is normal to feel mild stretching, pulling, tightness, or discomfort as you do these exercises. Stop right away if you feel sudden pain or your pain gets worse. Do not begin these exercises until told by your health care provider. Stretching and range-of-motion exercises These exercises warm up your muscles and joints and improve the movement and flexibility of your hips and back. These exercises also help to relieve pain, numbness, and tingling. Sciatic nerve glide 1. Sit in a chair with your head facing down toward your chest. Place your hands behind your back. Let your shoulders slump forward. 2. Slowly straighten one of your legs while you tilt your head back as if you are looking toward the ceiling. Only straighten your leg as far as you can without making your symptoms worse. 3. Hold this position for __________ seconds. 4. Slowly return to the starting position. 5. Repeat with your other leg. Repeat __________ times. Complete this exercise __________ times a day. Knee to chest with hip adduction and internal rotation  1. Lie on your back on a firm surface with both legs straight. 2. Bend one of your knees and move it up toward your chest until you feel a gentle stretch in your lower back and buttock. Then, move your knee toward the shoulder that is on the opposite side from your leg. This is hip adduction and internal rotation. ? Hold your leg in this position by holding on to the front of your knee. 3. Hold this position for __________ seconds. 4. Slowly return to the starting position. 5. Repeat with your other leg. Repeat __________ times. Complete this exercise __________ times a day. Prone extension on elbows  1. Lie on your abdomen on a firm surface. A bed may be too soft for this exercise. 2. Prop yourself up on  your elbows. 3. Use your arms to help lift your chest up until you feel a gentle stretch in your abdomen and your lower back. ? This will place some of your body weight on your elbows. If this is uncomfortable, try stacking pillows under your chest. ? Your hips should stay down, against the surface that you are lying on. Keep your hip and back muscles relaxed. 4. Hold this position for __________ seconds. 5. Slowly relax your upper body and return to the starting position. Repeat __________ times. Complete this exercise __________ times a day. Strengthening exercises These exercises build strength and endurance in your back. Endurance is the ability to use your muscles for a long time, even after they get tired. Pelvic tilt This exercise strengthens the muscles that lie deep in the abdomen. 1. Lie on your back on a firm surface. Bend your knees and keep your feet flat on the floor. 2. Tense your abdominal muscles. Tip your pelvis up toward the ceiling and flatten your lower back into the floor. ? To help with this exercise, you may place a small towel under your lower back and try to push your back into the towel. 3. Hold this position for __________ seconds. 4. Let your muscles relax completely before you repeat this exercise. Repeat __________ times. Complete this exercise __________ times a day. Alternating arm and leg raises  1. Get on your hands and knees on a firm surface. If you are on a hard floor, you may want to use   padding, such as an exercise mat, to cushion your knees. 2. Line up your arms and legs. Your hands should be directly below your shoulders, and your knees should be directly below your hips. 3. Lift your left leg behind you. At the same time, raise your right arm and straighten it in front of you. ? Do not lift your leg higher than your hip. ? Do not lift your arm higher than your shoulder. ? Keep your abdominal and back muscles tight. ? Keep your hips facing the  ground. ? Do not arch your back. ? Keep your balance carefully, and do not hold your breath. 4. Hold this position for __________ seconds. 5. Slowly return to the starting position. 6. Repeat with your right leg and your left arm. Repeat __________ times. Complete this exercise __________ times a day. Posture and body mechanics Good posture and healthy body mechanics can help to relieve stress in your body's tissues and joints. Body mechanics refers to the movements and positions of your body while you do your daily activities. Posture is part of body mechanics. Good posture means:  Your spine is in its natural S-curve position (neutral).  Your shoulders are pulled back slightly.  Your head is not tipped forward. Follow these guidelines to improve your posture and body mechanics in your everyday activities. Standing   When standing, keep your spine neutral and your feet about hip width apart. Keep a slight bend in your knees. Your ears, shoulders, and hips should line up.  When you do a task in which you stand in one place for a long time, place one foot up on a stable object that is 2-4 inches (5-10 cm) high, such as a footstool. This helps keep your spine neutral. Sitting   When sitting, keep your spine neutral and keep your feet flat on the floor. Use a footrest, if necessary, and keep your thighs parallel to the floor. Avoid rounding your shoulders, and avoid tilting your head forward.  When working at a desk or a computer, keep your desk at a height where your hands are slightly lower than your elbows. Slide your chair under your desk so you are close enough to maintain good posture.  When working at a computer, place your monitor at a height where you are looking straight ahead and you do not have to tilt your head forward or downward to look at the screen. Resting  When lying down and resting, avoid positions that are most painful for you.  If you have pain with activities  such as sitting, bending, stooping, or squatting, lie in a position in which your body does not bend very much. For example, avoid curling up on your side with your arms and knees near your chest (fetal position).  If you have pain with activities such as standing for a long time or reaching with your arms, lie with your spine in a neutral position and bend your knees slightly. Try the following positions: ? Lying on your side with a pillow between your knees. ? Lying on your back with a pillow under your knees. Lifting   When lifting objects, keep your feet at least shoulder width apart and tighten your abdominal muscles.  Bend your knees and hips and keep your spine neutral. It is important to lift using the strength of your legs, not your back. Do not lock your knees straight out.  Always ask for help to lift heavy or awkward objects. This information is not   intended to replace advice given to you by your health care provider. Make sure you discuss any questions you have with your health care provider. Document Revised: 09/25/2018 Document Reviewed: 06/25/2018 Elsevier Patient Education  [employer].   Call extension [phone] on Friday morning if you are not better so we can order Physical therapy

## 2020-05-26 ENCOUNTER — Telehealth: Payer: Medicare Other

## 2020-05-26 ENCOUNTER — Ambulatory Visit: Payer: Self-pay

## 2020-05-26 ENCOUNTER — Other Ambulatory Visit: Payer: Self-pay

## 2020-05-26 DIAGNOSIS — I10 Essential (primary) hypertension: Secondary | ICD-10-CM

## 2020-05-26 DIAGNOSIS — I700 Atherosclerosis of aorta: Secondary | ICD-10-CM

## 2020-05-26 NOTE — Chronic Care Management (AMB) (Signed)
Chronic Care Management    Social Work General Note  05/26/2020 Name: Terri Barajas MRN: [id number] DOB: Sep 03, 1945  Terri Barajas is a 74 y.o. year old female who is a primary care patient of [name], [place]. The CCM was consulted to assist the patient with care coordination.   [name] was given information about Chronic Care Management services today including:  1. CCM service includes personalized support from designated clinical staff supervised by her physician, including individualized plan of care and coordination with other care providers 2. 24/7 contact phone numbers for assistance for urgent and routine care needs. 3. Service will only be billed when office clinical staff spend 20 minutes or more in a month to coordinate care. 4. Only one practitioner may furnish and bill the service in a calendar month. 5. The patient may stop CCM services at any time (effective at the end of the month) by phone call to the office staff. 6. The patient will be responsible for cost sharing (co-pay) of up to 20% of the service fee (after annual deductible is met).  Patient agreed to services and verbal consent obtained.   Review of patient status, including review of consultants reports, relevant laboratory and other test results, and collaboration with appropriate care team members and the patient's provider was performed as part of comprehensive patient evaluation and provision of chronic care management services.    SDOH (Social Determinants of Health) assessments and interventions performed:  Yes SDOH Interventions   Flowsheet Row Most Recent Value  SDOH Interventions   Food Insecurity Interventions Intervention Not Indicated  Housing Interventions Intervention Not Indicated  Transportation Interventions Intervention Not Indicated       Outpatient Encounter Medications as of 05/26/2020  Medication Sig Note  . ALPRAZolam (XANAX) 0.5 MG tablet Take 0.5 tablets (0.25 mg  total) by mouth daily as needed for anxiety.   Marland Kitchen aspirin 81 MG chewable tablet Chew 81 mg by mouth daily.   . benzonatate (TESSALON) 200 MG capsule TAKE 1 CAPSULE BY MOUTH 2 TIMES DAILY AS NEEDED FOR COUGH.   Marland Kitchen BIOTIN PO Take 1 tablet by mouth daily.    . brimonidine (ALPHAGAN) 0.2 % ophthalmic solution Place 1 drop into both eyes in the morning and at bedtime.   . Cholecalciferol (VITAMIN D3) 25 MCG (1000 UT) CAPS Take 1,000 Units by mouth daily.   . cyclobenzaprine (FLEXERIL) 10 MG tablet Take 1 tablet (10 mg total) by mouth 2 (two) times daily as needed for muscle spasms.   . DEXILANT 60 MG capsule TAKE 1 CAPSULE BY MOUTH  DAILY   . diltiazem (CARDIZEM) 30 MG tablet Take 1 tablet (30 mg total) by mouth 2 (two) times daily.   . diphenhydrAMINE (BENADRYL) 25 MG tablet Take 25 mg by mouth as needed.   . dorzolamide-timolol (COSOPT) 22.3-6.8 MG/ML ophthalmic solution Place 1 drop into both eyes 2 (two) times daily.   Marland Kitchen EPINEPHrine 0.3 mg/0.3 mL IJ SOAJ injection Inject 0.3 mg into the muscle as needed.  05/05/2020: On hand  . fluticasone (FLONASE) 50 MCG/ACT nasal spray Place 2 sprays into both nostrils daily.   . hydrochlorothiazide (HYDRODIURIL) 25 MG tablet TAKE 1 TABLET BY MOUTH  DAILY   . ibuprofen (ADVIL) 800 MG tablet Take 1 tablet (800 mg total) by mouth 3 (three) times daily.   Marland Kitchen latanoprost (XALATAN) 0.005 % ophthalmic solution Place 1 drop into both eyes at bedtime.    . Multiple Vitamins-Minerals (CENTRUM SILVER 50+WOMEN PO) Take 1  tablet by mouth daily.   . Omega-3 Fatty Acids (FISH OIL PO) Take 1 capsule by mouth daily.   Marland Kitchen oxyCODONE-acetaminophen (PERCOCET) 10-325 MG tablet Take 1 tablet by mouth every 4 (four) hours as needed for pain.   . polycarbophil (FIBERCON) 625 MG tablet Take 625 mg by mouth daily.   . polyethylene glycol (MIRALAX / GLYCOLAX) packet Take 17 g by mouth daily.   . predniSONE (DELTASONE) 20 MG tablet Take 2 tablets by mouth once a day in am x 3 days   .  tiZANidine (ZANAFLEX) 2 MG tablet Take 1 tablet (2 mg total) by mouth every 8 (eight) hours as needed for muscle spasms.    No facility-administered encounter medications on file as of 05/26/2020.    Patient Care Plan: Assist with Chronic Care Management and Care Coordination Needs    Problem Identified: Assist with Chronic Care Management and Care Coordination Needs   Priority: High  Onset Date: 05/26/2020  Note:   Current Barriers:   Ineffective Self Health Maintenance  Knowledge Deficits related to how to obtain grab bars and shower chair   Currently UNABLE TO independently self manage needs related to chronic health conditions.   Knowledge Deficits related to short term plan for care coordination needs and long term plans for chronic disease management needs  Nurse Case Manager Clinical Goal(s):   Over the next 60 days, patient will work with care management team to address care coordination and chronic disease management needs related to Disease Management  Educational Needs  Care Coordination    Interventions:   One on one collaboration to establish plan of care to address care coordination and disease management needs with clinical colleague [name] BSW.   Follow Up Plan: Telephone follow up appointment with care management team member scheduled for: 06/28/20   Patient Care Plan: Social Work Care Plan    Problem Identified: Care Coordination   Priority: Medium    Goal: Assist with home modification needs   Start Date: 05/26/2020  Expected End Date: 08/24/2020  This Visit's Progress: On track  Priority: High  Note:   Current Barriers:  . Lacks knowledge of community resources to assist with home modification needs . Chronic conditions including HTN and Aortic Atherosclerosis which put patient at increased risk of hospitalization  Social Work Clinical Goal(s):  Marland Kitchen Over the next 45 days the patient will work with SW to identify resources to assist with home  modification needs  CCM SW Interventions: . Inter-disciplinary care team collaboration (see longitudinal plan of care) . Successful outbound call placed to the patient to assess for care coordination needs . Determined the patient is interested in resources to assist with the installation of grab bars as well as a shower chair . Provided education on [place] . Mailed the patient information on this program for review . Discussed plan for SW to follow up with the patient over the next 45 days to confirm receipt of mailing and review information provided . Collaboration with RN Care Manager regarding today's interventions and plan  Patient Goals/Self-Care Activities Over the next 45 days, patient will:   - Patient will self administer medications as prescribed -Patient will attend all scheduled provider appointments -Patient will call provider office for new concerns or questions -Review mailed resource information -Contact SW as needed prior to next scheduled call  Follow up Plan: SW will follow up with patient by phone over the next 45 days  [name], BSW, CDP Social Worker, Certified Dementia Practitioner [place] [facility] [place] [facility] [phone]  Total time spent performing care coordination and/or care management activities with the patient by phone or face to face = 15 minutes.

## 2020-05-26 NOTE — Patient Instructions (Signed)
   Goals we discussed today:  Goals Addressed            This Visit's Progress   . Work with SW to manage care coordination needs       Timeframe:  Long-Range Goal Priority:  High Start Date:  12.10.21                           Expected End Date:  3.10.22                      Next expected date of contact: 1.12.22  Patient Goals/Self-Care Activities Over the next 45 days, patient will:   - Patient will self administer medications as prescribed -Patient will attend all scheduled provider appointments -Patient will call provider office for new concerns or questions -Review mailed resource information -Contact SW as needed prior to next scheduled call

## 2020-05-26 NOTE — Chronic Care Management (AMB) (Signed)
Chronic Care Management   Initial Visit Note  05/26/2020 Name: Terri Barajas MRN: [id number] DOB: May 13, 1946  Referred by: [name], FNP Reason for referral : No chief complaint on file.   KINZIE Barajas is a 74 y.o. year old female who is a primary care patient of [name], [place]. The care management team was consulted for assistance with chronic disease management and care coordination needs related to HTN and Aortic Athersclerosis.  Review of patient status, including review of consultants reports, relevant laboratory and other test results, and collaboration with appropriate care team members and the patient's provider was performed as part of comprehensive patient evaluation and provision of chronic care management services.    I initiated and established the plan of care for Terri Barajas during one on one collaboration with my clinical care management colleague [name] BSW who is also engaged with this patient to address social work needs.   Outpatient Encounter Medications as of 05/26/2020  Medication Sig Note  . ALPRAZolam (XANAX) 0.5 MG tablet Take 0.5 tablets (0.25 mg total) by mouth daily as needed for anxiety.   Marland Kitchen aspirin 81 MG chewable tablet Chew 81 mg by mouth daily.   . benzonatate (TESSALON) 200 MG capsule TAKE 1 CAPSULE BY MOUTH 2 TIMES DAILY AS NEEDED FOR COUGH.   Marland Kitchen BIOTIN PO Take 1 tablet by mouth daily.    . brimonidine (ALPHAGAN) 0.2 % ophthalmic solution Place 1 drop into both eyes in the morning and at bedtime.   . Cholecalciferol (VITAMIN D3) 25 MCG (1000 UT) CAPS Take 1,000 Units by mouth daily.   . cyclobenzaprine (FLEXERIL) 10 MG tablet Take 1 tablet (10 mg total) by mouth 2 (two) times daily as needed for muscle spasms.   . DEXILANT 60 MG capsule TAKE 1 CAPSULE BY MOUTH  DAILY   . diltiazem (CARDIZEM) 30 MG tablet Take 1 tablet (30 mg total) by mouth 2 (two) times daily.   . diphenhydrAMINE (BENADRYL) 25 MG tablet Take 25 mg by  mouth as needed.   . dorzolamide-timolol (COSOPT) 22.3-6.8 MG/ML ophthalmic solution Place 1 drop into both eyes 2 (two) times daily.   Marland Kitchen EPINEPHrine 0.3 mg/0.3 mL IJ SOAJ injection Inject 0.3 mg into the muscle as needed.  05/05/2020: On hand  . fluticasone (FLONASE) 50 MCG/ACT nasal spray Place 2 sprays into both nostrils daily.   . hydrochlorothiazide (HYDRODIURIL) 25 MG tablet TAKE 1 TABLET BY MOUTH  DAILY   . ibuprofen (ADVIL) 800 MG tablet Take 1 tablet (800 mg total) by mouth 3 (three) times daily.   Marland Kitchen latanoprost (XALATAN) 0.005 % ophthalmic solution Place 1 drop into both eyes at bedtime.    . Multiple Vitamins-Minerals (CENTRUM SILVER 50+WOMEN PO) Take 1 tablet by mouth daily.   . Omega-3 Fatty Acids (FISH OIL PO) Take 1 capsule by mouth daily.   Marland Kitchen oxyCODONE-acetaminophen (PERCOCET) 10-325 MG tablet Take 1 tablet by mouth every 4 (four) hours as needed for pain.   . polycarbophil (FIBERCON) 625 MG tablet Take 625 mg by mouth daily.   . polyethylene glycol (MIRALAX / GLYCOLAX) packet Take 17 g by mouth daily.   . predniSONE (DELTASONE) 20 MG tablet Take 2 tablets by mouth once a day in am x 3 days   . tiZANidine (ZANAFLEX) 2 MG tablet Take 1 tablet (2 mg total) by mouth every 8 (eight) hours as needed for muscle spasms.    No facility-administered encounter medications on file as of 05/26/2020.  Patient Care Plan: Assist with Chronic Care Management and Care Coordination Needs    Problem Identified: Assist with Chronic Care Management and Care Coordination Needs   Priority: High  Onset Date: 05/26/2020  Note:   Current Barriers:   Ineffective Self Health Maintenance  Knowledge Deficits related to how to obtain grab bars and shower chair   Currently UNABLE TO independently self manage needs related to chronic health conditions.   Knowledge Deficits related to short term plan for care coordination needs and long term plans for chronic disease management needs  Nurse Case  Manager Clinical Goal(s):   Over the next 60 days, patient will work with care management team to address care coordination and chronic disease management needs related to Disease Management  Educational Needs  Care Coordination    Interventions:   One on one collaboration to establish plan of care to address care coordination and disease management needs with clinical colleague [name] BSW.   Follow Up Plan: Telephone follow up appointment with care management team member scheduled for: 06/28/20     Telephone follow up appointment with care management team member scheduled for: 06/28/20  Barb Merino, RN, BSN, CCM  Care Management Coordinator [facility]  Direct Phone: [phone]

## 2020-05-28 ENCOUNTER — Encounter: Payer: Self-pay | Admitting: Nurse Practitioner

## 2020-05-29 ENCOUNTER — Other Ambulatory Visit: Payer: Self-pay | Admitting: Nurse Practitioner

## 2020-05-29 DIAGNOSIS — M5432 Sciatica, left side: Secondary | ICD-10-CM

## 2020-05-29 DIAGNOSIS — M5442 Lumbago with sciatica, left side: Secondary | ICD-10-CM

## 2020-06-01 ENCOUNTER — Encounter: Payer: Self-pay | Admitting: Nurse Practitioner

## 2020-06-01 ENCOUNTER — Other Ambulatory Visit: Payer: Self-pay | Admitting: Nurse Practitioner

## 2020-06-01 DIAGNOSIS — Z20822 Contact with and (suspected) exposure to covid-19: Secondary | ICD-10-CM | POA: Diagnosis not present

## 2020-06-01 DIAGNOSIS — M5442 Lumbago with sciatica, left side: Secondary | ICD-10-CM

## 2020-06-01 MED ORDER — TIZANIDINE HCL 2 MG PO TABS: 2.0000 mg | ORAL_TABLET | Freq: Three times a day (TID) | ORAL | 0 refills | Status: DC | PRN

## 2020-06-01 MED ORDER — ACETAMINOPHEN-CODEINE #2 300-15 MG PO TABS: 1.0000 | ORAL_TABLET | ORAL | 0 refills | Status: DC | PRN

## 2020-06-15 ENCOUNTER — Encounter: Payer: Medicare Other | Admitting: Nurse Practitioner

## 2020-06-15 ENCOUNTER — Other Ambulatory Visit: Payer: Self-pay | Admitting: Gastroenterology

## 2020-06-15 ENCOUNTER — Ambulatory Visit: Payer: Medicare Other

## 2020-06-24 DIAGNOSIS — Z20822 Contact with and (suspected) exposure to covid-19: Secondary | ICD-10-CM | POA: Diagnosis not present

## 2020-06-27 ENCOUNTER — Other Ambulatory Visit: Payer: Self-pay

## 2020-06-27 ENCOUNTER — Ambulatory Visit
Admission: RE | Admit: 2020-06-27 | Discharge: 2020-06-27 | Disposition: A | Discharge status: Home / Self Care | Payer: Medicare Other | Source: Ambulatory Visit | Attending: Nurse Practitioner | Admitting: Nurse Practitioner

## 2020-06-27 DIAGNOSIS — M5442 Lumbago with sciatica, left side: Secondary | ICD-10-CM

## 2020-06-27 DIAGNOSIS — M48061 Spinal stenosis, lumbar region without neurogenic claudication: Secondary | ICD-10-CM | POA: Diagnosis not present

## 2020-06-27 DIAGNOSIS — M545 Low back pain, unspecified: Secondary | ICD-10-CM | POA: Diagnosis not present

## 2020-06-27 DIAGNOSIS — M5432 Sciatica, left side: Secondary | ICD-10-CM

## 2020-06-28 ENCOUNTER — Ambulatory Visit: Payer: Medicare Other

## 2020-06-28 DIAGNOSIS — I700 Atherosclerosis of aorta: Secondary | ICD-10-CM

## 2020-06-28 DIAGNOSIS — I10 Essential (primary) hypertension: Secondary | ICD-10-CM

## 2020-06-28 NOTE — Chronic Care Management (AMB) (Signed)
  Chronic Care Management   Outreach Note  06/28/2020 Name: Terri Barajas MRN: [id number] DOB: May 14, 1946  Referred by: [name], FNP Reason for referral : Care Coordination   SW placed a successful outbound call to the patient to assist with care coordination needs. The patient reports she has yet to review mailed information and requests a call at a later date.  Follow Up Plan: The care management team will reach out to the patient again over the next 21 days.   [name], BSW, CDP Social Worker, Certified Dementia Practitioner [facility] [phone]

## 2020-06-29 ENCOUNTER — Encounter: Payer: Self-pay | Admitting: Nurse Practitioner

## 2020-06-30 ENCOUNTER — Other Ambulatory Visit: Payer: Self-pay | Admitting: Nurse Practitioner

## 2020-06-30 DIAGNOSIS — R937 Abnormal findings on diagnostic imaging of other parts of musculoskeletal system: Secondary | ICD-10-CM

## 2020-06-30 DIAGNOSIS — M5432 Sciatica, left side: Secondary | ICD-10-CM

## 2020-06-30 MED ORDER — GABAPENTIN 100 MG PO CAPS: 100.0000 mg | ORAL_CAPSULE | Freq: Three times a day (TID) | ORAL | 2 refills | Status: DC

## 2020-06-30 NOTE — Telephone Encounter (Signed)
Called patient to provide results of her MRI low back, has a pinched nerve, made a referral to neurosurgery for further evaluation and sent Rx for gabapentin to take starting with 100 mg at bedtime then may increase to 300 mg at bedtime if causes drowsiness but if not can take 3 times a day. Verbalized understanding.

## 2020-07-05 ENCOUNTER — Telehealth: Payer: Medicare Other

## 2020-07-05 ENCOUNTER — Ambulatory Visit: Payer: Self-pay

## 2020-07-05 ENCOUNTER — Other Ambulatory Visit: Payer: Self-pay

## 2020-07-05 DIAGNOSIS — M5432 Sciatica, left side: Secondary | ICD-10-CM

## 2020-07-05 DIAGNOSIS — K219 Gastro-esophageal reflux disease without esophagitis: Secondary | ICD-10-CM

## 2020-07-05 DIAGNOSIS — I10 Essential (primary) hypertension: Secondary | ICD-10-CM

## 2020-07-05 DIAGNOSIS — R937 Abnormal findings on diagnostic imaging of other parts of musculoskeletal system: Secondary | ICD-10-CM

## 2020-07-05 DIAGNOSIS — I700 Atherosclerosis of aorta: Secondary | ICD-10-CM

## 2020-07-06 DIAGNOSIS — I10 Essential (primary) hypertension: Secondary | ICD-10-CM | POA: Diagnosis not present

## 2020-07-06 DIAGNOSIS — M5126 Other intervertebral disc displacement, lumbar region: Secondary | ICD-10-CM | POA: Insufficient documentation

## 2020-07-06 DIAGNOSIS — Z6826 Body mass index (BMI) 26.0-26.9, adult: Secondary | ICD-10-CM | POA: Insufficient documentation

## 2020-07-07 ENCOUNTER — Telehealth: Payer: Self-pay | Admitting: *Deleted

## 2020-07-07 NOTE — Patient Instructions (Addendum)
Visit Information Patient Care Plan: Assist with Chronic Care Management and Care Coordination Needs    Problem Identified: Assist with Chronic Care Management and Care Coordination Needs Resolved 07/05/2020  Priority: High  Onset Date: 05/26/2020  Note:   Current Barriers:   Ineffective Self Health Maintenance  Knowledge Deficits related to how to obtain grab bars and shower chair   Currently UNABLE TO independently self manage needs related to chronic health conditions.   Knowledge Deficits related to short term plan for care coordination needs and long term plans for chronic disease management needs  Nurse Case Manager Clinical Goal(s):   Over the next 60 days, patient will work with care management team to address care coordination and chronic disease management needs related to Disease Management  Educational Needs  Care Coordination    Interventions:   Initial CCM RN CM call completed with patient. Please see additional care plan goals.   Follow Up Plan: Telephone follow up appointment with care management team member scheduled for: 08/16/20   Patient Care Plan: Social Work Care Plan    Problem Identified: Care Coordination   Priority: Medium    Goal: Assist with home modification needs   Start Date: 05/26/2020  Expected End Date: 08/24/2020  This Visit's Progress: On track  Priority: High  Note:   Current Barriers:  . Lacks knowledge of community resources to assist with home modification needs . Chronic conditions including HTN and Aortic Atherosclerosis which put patient at increased risk of hospitalization  Social Work Clinical Goal(s):  Marland Kitchen Over the next 45 days the patient will work with SW to identify resources to assist with home modification needs  CCM SW Interventions: . Inter-disciplinary care team collaboration (see longitudinal plan of care) . Successful outbound call placed to the patient to assess for care coordination needs . Determined the patient  is interested in resources to assist with the installation of grab bars as well as a shower chair . Provided education on [place] . Mailed the patient information on this program for review . Discussed plan for SW to follow up with the patient over the next 45 days to confirm receipt of mailing and review information provided . Collaboration with RN Care Manager regarding today's interventions and plan  Patient Goals/Self-Care Activities Over the next 45 days, patient will:   - Patient will self administer medications as prescribed -Patient will attend all scheduled provider appointments -Patient will call provider office for new concerns or questions -Review mailed resource information -Contact SW as needed prior to next scheduled call  Follow up Plan: SW will follow up with patient by phone over the next 45 days    Patient Care Plan: Medication Adherence (Maintained)    Problem Identified: Medication Adherence (Wellness)   Priority: Medium    Goal: Medication Adherence Maintained   Start Date: 07/05/2020  Expected End Date: 08/18/2020  This Visit's Progress: On track  Priority: Medium  Note:   Current Barriers:   Ineffective Self Health Maintenance  Currently UNABLE TO independently self manage needs related to chronic health conditions.   Knowledge Deficits related to short term plan for care coordination needs and long term plans for chronic disease management needs Case Manager Clinical Goal(s):  Marland Kitchen Collaboration with [name], FNP regarding development and update of comprehensive plan of care as evidenced by provider attestation and co-signature . Inter-disciplinary care team collaboration (see longitudinal plan of care)  Over the next 90 days, patient will work with care management  team to address care coordination and chronic disease management needs related to Disease Management  Educational Needs  Care Coordination  Medication Management and  Education  Psychosocial Support   Interventions:   Assessed for medication adherence including barriers to medication adherence  Determined patient is adherent to taking her medications as prescribed w/o missed doses  Determined patient is experiencing financial difficulty with paying for Dexilant  Discussed counseling and medication review by pharmacist, referral sent to embedded Pharm D  Patient Goals/Self-Activities:  Over the next 30 days, patient will:  -work with embedded Pharm D for assistance with medication cost of Dexilant -continue to take medications exactly as prescribed -continue to call pharmacy for refills when supply is getting low and prior to running out of medication     Follow Up Plan: Telephone follow up appointment with care management team member scheduled for: 08/16/20    Patient Care Plan: Hypertension (Adult)    Problem Identified: Disease Progression (Hypertension)   Priority: Medium    Long-Range Goal: Disease Progression Prevented or Minimized   Start Date: 07/05/2020  Expected End Date: 10/03/2020  This Visit's Progress: On track  Priority: Medium  Note:   Objective:  . Last practice recorded BP readings:  BP Readings from Last 3 Encounters:  05/24/20 120/84  05/20/20 112/84  05/18/20 138/80 .   Marland Kitchen Most recent eGFR/CrCl: No results found for: EGFR  No components found for: CRCL Current Barriers:  Marland Kitchen Knowledge Deficits related to basic understanding of hypertension pathophysiology and self care management . Knowledge Deficits related to understanding of medications prescribed for management of hypertension Case Manager Clinical Goal(s):  Marland Kitchen Over the next 90 days, patient will demonstrate improved health management independence as evidenced by checking blood pressure as directed and notifying PCP if SBP>130 or DBP > 80, taking all medications as prescribe, and adhering to a low sodium diet as discussed. Interventions:  . Collaboration with [name], FNP regarding development and update of comprehensive plan of care as evidenced by provider attestation and co-signature . Inter-disciplinary care team collaboration (see longitudinal plan of care) . Evaluation of current treatment plan related to hypertension self management and patient's adherence to plan as established by provider . Determined patient has a home BP cuff and feels comfortable with using the BP cuff, she admits she does not remember to check her BP  . Educated on dietary and exercise recommendations, Educated with rationale on importance of adhering to low Sodium diet  . Reviewed medications with patient and discussed importance of compliance . Advised patient, providing education and rationale, to monitor blood pressure daily and record, calling PCP for findings outside established parameters.  . Mailed printed BP Log, What is High Blood Pressure?; Why Should I Lower Sodium?  . Discussed plans with patient for ongoing care management follow up and provided patient with direct contact information for care management team Patient Goals/Self-Care Activities . Over the next 90 days, patient will:  write blood pressure results in a log or diary  utilize mailed BP log to record BP readings to review with RN CM at next call  review printed educational materials related to BP management  Self administers medications as prescribed Attends all scheduled provider appointments Calls provider office for new concerns, questions, or BP outside discussed parameters Checks BP and records as discussed Follows a low sodium diet/DASH diet  Follow Up Plan: Telephone follow up appointment with care management team member scheduled for:  08/16/20   Patient Care Plan: Nonspecific  Low Back Pain (Adult)    Problem Identified: Acute Low Back Pain   Priority: High    Goal: Acute Low Back Pain Managed   Start Date: 07/05/2020  Expected End Date: 10/03/2020  This Visit's Progress: On track   Priority: High  Note:   Current Barriers:  Marland Kitchen Knowledge Deficits related to self-health management of acute or chronic pain . Chronic Disease Management support and education needs related to chronic pain Clinical Goal(s):  Marland Kitchen Over the next 30 days, patient will verbalize understanding of plan for impinged nerve to lumber spine. Interventions:  . Collaboration with [name], FNP regarding development and update of comprehensive plan of care as evidenced by provider attestation and co-signature . Evaluation of current treatment plan related to impinged nerve pain and patient's adherence to plan as established by provider . pain assessed . effectiveness of pharmacologic therapy monitored . Determined patient was referred to Neurosurgeon for evaluation and treatment of impinged nerve . Determined patient is using a cane for ambulation, denies falls  . Reviewed scheduled/upcoming provider appointments including: Neurosurgeon consultation is scheduled for 07/17/20, patient was offered earlier time and may call to request the earlier time offered  . Discussed plans with patient for ongoing care management follow up and provided patient with direct contact information for care management team Patient Goals/Self Care Activities:  -Follow up with Neurosurgeon as recommended for evaluation and treatment of impinged nerve to lumbar spine -Seek medical attention promptly if symptoms persist or worsen   Follow Up Plan: Telephone follow up appointment with care management team member scheduled for: 08/16/20      Goals Addressed    . Follow up with Neurosurgery for evaluation and treatment of impinged nerve to lumbar spine   On track    Timeframe:  Short-Term Goal Priority:  High Start Date:  07/05/20                           Expected End Date:  10/03/20      Follow Up Date:  08/16/20  Over the next 30 days, patient will -Follow up with Neurosurgeon as recommended for evaluation and treatment of  impinged nerve to lumbar spine -Seek medical attention promptly if symptoms persist or worsen     . Track and Manage My Blood Pressure-Hypertension   On track    Timeframe:  Long-Range Goal Priority:  Medium Start Date: 07/05/20                           Expected End Date: 10/03/20                    Follow Up Date: 08/16/20   - write blood pressure results in a log or diary  -utilize mailed BP log to record BP readings to review with RN CM at next call  -review printed educational materials related to BP management  -Self administers medications as prescribed -Attends all scheduled provider appointments -Calls provider office for new concerns, questions, or BP outside discussed parameters -Checks BP and records as discussed -Follows a low sodium diet/DASH diet   Why is this important?    You won't feel high blood pressure, but it can still hurt your blood vessels.   High blood pressure can cause heart or kidney problems. It can also cause a stroke.   Making lifestyle changes like losing a little weight or eating less salt will  help.   Checking your blood pressure at home and at different times of the day can help to control blood pressure.   If the doctor prescribes medicine remember to take it the way the doctor ordered.   Call the office if you cannot afford the medicine or if there are questions about it.     Notes:     . Work with embedded Pharm D for cost assistance of Dexilant   On track    Timeframe:  Short-Term Goal Priority:  Medium Start Date: 07/05/20                            Expected End Date:  08/18/20   Follow Up Date: 08/16/20  Over the next 30 days, patient will:  -work with embedded Pharm D for assistance with medication cost of Dexilant -continue to take medications exactly as prescribed -continue to call pharmacy for refills when supply is getting low and prior to running out of medication                          The patient verbalized understanding  of instructions, educational materials, and care plan provided today and declined offer to receive copy of patient instructions, educational materials, and care plan.   Telephone follow up appointment with care management team member scheduled for: 08/16/20  Lynne Logan, RN

## 2020-07-07 NOTE — Chronic Care Management (AMB) (Signed)
Chronic Care Management   CCM RN Visit Note  07/05/2020 Name: Terri Barajas MRN: [id number] DOB: 03-31-1946  Subjective: Terri Barajas is a 75 y.o. year old female who is a primary care patient of [name], [place]. The care management team was consulted for assistance with disease management and care coordination needs.    Engaged with patient by telephone for follow up visit in response to provider referral for case management and/or care coordination services.   Consent to Services:  The patient was given information about Chronic Care Management services, agreed to services, and gave verbal consent prior to initiation of services.  Please see initial visit note for detailed documentation.   Patient agreed to services and verbal consent obtained.   Assessment: Review of patient past medical history, allergies, medications, health status, including review of consultants reports, laboratory and other test data, was performed as part of comprehensive evaluation and provision of chronic care management services.   SDOH (Social Determinants of Health) assessments and interventions performed:  Yes, no acute challenges identified   CCM Care Plan  Allergies  Allergen Reactions  . Atracurium & Derivatives Other (See Comments)    Unknown  . Atropine Other (See Comments)    Heart racing  . Iodine Other (See Comments)    Unknown  . Shellfish Allergy Other (See Comments)    Unknown  . Statins     "didn't feel right"    Outpatient Encounter Medications as of 07/05/2020  Medication Sig Note  . acetaminophen-codeine (TYLENOL #2) 300-15 MG tablet Take 1 tablet by mouth every 4 (four) hours as needed for moderate pain.   Marland Kitchen ALPRAZolam (XANAX) 0.5 MG tablet Take 0.5 tablets (0.25 mg total) by mouth daily as needed for anxiety.   Marland Kitchen aspirin 81 MG chewable tablet Chew 81 mg by mouth daily.   . benzonatate (TESSALON) 200 MG capsule TAKE 1 CAPSULE BY MOUTH 2 TIMES DAILY AS NEEDED FOR  COUGH.   Marland Kitchen BIOTIN PO Take 1 tablet by mouth daily.    . brimonidine (ALPHAGAN) 0.2 % ophthalmic solution Place 1 drop into both eyes in the morning and at bedtime.   . Cholecalciferol (VITAMIN D3) 25 MCG (1000 UT) CAPS Take 1,000 Units by mouth daily.   Marland Kitchen DEXILANT 60 MG capsule TAKE 1 CAPSULE BY MOUTH  DAILY   . diltiazem (CARDIZEM) 30 MG tablet Take 1 tablet (30 mg total) by mouth 2 (two) times daily.   . diphenhydrAMINE (BENADRYL) 25 MG tablet Take 25 mg by mouth as needed.   . dorzolamide-timolol (COSOPT) 22.3-6.8 MG/ML ophthalmic solution Place 1 drop into both eyes 2 (two) times daily.   Marland Kitchen EPINEPHrine 0.3 mg/0.3 mL IJ SOAJ injection Inject 0.3 mg into the muscle as needed.  05/05/2020: On hand  . fluticasone (FLONASE) 50 MCG/ACT nasal spray Place 2 sprays into both nostrils daily.   Marland Kitchen gabapentin (NEURONTIN) 100 MG capsule Take 1 capsule (100 mg total) by mouth 3 (three) times daily.   . hydrochlorothiazide (HYDRODIURIL) 25 MG tablet TAKE 1 TABLET BY MOUTH  DAILY   . ibuprofen (ADVIL) 800 MG tablet Take 1 tablet (800 mg total) by mouth 3 (three) times daily.   Marland Kitchen latanoprost (XALATAN) 0.005 % ophthalmic solution Place 1 drop into both eyes at bedtime.    . Multiple Vitamins-Minerals (CENTRUM SILVER 50+WOMEN PO) Take 1 tablet by mouth daily.   . Omega-3 Fatty Acids (FISH OIL PO) Take 1 capsule by mouth daily.   . polycarbophil (FIBERCON) 625  MG tablet Take 625 mg by mouth daily.   . polyethylene glycol (MIRALAX / GLYCOLAX) packet Take 17 g by mouth daily.   . predniSONE (DELTASONE) 20 MG tablet Take 2 tablets by mouth once a day in am x 3 days   . tiZANidine (ZANAFLEX) 2 MG tablet Take 1 tablet (2 mg total) by mouth every 8 (eight) hours as needed for muscle spasms.    No facility-administered encounter medications on file as of 07/05/2020.    Patient Active Problem List   Diagnosis Date Noted  . OSA on CPAP 01/06/2019  . Abnormal CT of the abdomen 10/04/2018  . Radiculopathy affecting  upper extremity, left 07/25/2018  . Hyperlipidemia 05/13/2018  . Situational anxiety 04/29/2018  . MVP (mitral valve prolapse) 04/29/2018  . Sleep-disordered breathing 04/29/2018  . Family History of Hyperthermia, malignant 04/23/2018  . Barrett's esophagus 03/01/2018  . Elevated lipase 02/18/2018  . Hiatal hernia 02/18/2018  . Constipation 02/18/2018  . Bloating 02/18/2018  . Aortic atherosclerosis ([place]) 01/23/2018  . Gastroesophageal reflux disease without esophagitis   . Glaucoma   . Hypertension     Conditions to be addressed/monitored:HTN, Aortic Atherosclerosis, Gastroesophageal reflux, Left sciatica nerve pain, Abnormal MRI, lumbar spine   Care Plan : Assist with Chronic Care Management and Care Coordination Needs  Updates made by Lynne Logan, RN since 07/07/2020 12:00 AM    Problem: Assist with Chronic Care Management and Care Coordination Needs Resolved 07/05/2020  Priority: High  Onset Date: 05/26/2020  Note:   Current Barriers:   Ineffective Self Health Maintenance  Knowledge Deficits related to how to obtain grab bars and shower chair   Currently UNABLE TO independently self manage needs related to chronic health conditions.   Knowledge Deficits related to short term plan for care coordination needs and long term plans for chronic disease management needs  Nurse Case Manager Clinical Goal(s):   Over the next 60 days, patient will work with care management team to address care coordination and chronic disease management needs related to Disease Management  Educational Needs  Care Coordination    Interventions:   Initial CCM RN CM call completed with patient. Please see additional care plan goals.   Follow Up Plan: Telephone follow up appointment with care management team member scheduled for: 08/16/20   Care Plan : Medication Adherence (Maintained)  Updates made by Lynne Logan, RN since 07/07/2020 12:00 AM    Problem: Medication Adherence (Wellness)    Priority: Medium    Goal: Medication Adherence Maintained   Start Date: 07/05/2020  Expected End Date: 08/18/2020  This Visit's Progress: On track  Priority: Medium  Note:   Current Barriers:   Ineffective Self Health Maintenance  Currently UNABLE TO independently self manage needs related to chronic health conditions.   Knowledge Deficits related to short term plan for care coordination needs and long term plans for chronic disease management needs Case Manager Clinical Goal(s):  Marland Kitchen Collaboration with [name], FNP regarding development and update of comprehensive plan of care as evidenced by provider attestation and co-signature . Inter-disciplinary care team collaboration (see longitudinal plan of care)  Over the next 90 days, patient will work with care management team to address care coordination and chronic disease management needs related to Disease Management  Educational Needs  Care Coordination  Medication Management and Education  Psychosocial Support   Interventions:   Assessed for medication adherence including barriers to medication adherence  Determined patient is adherent to taking her medications as  prescribed w/o missed doses  Determined patient is experiencing financial difficulty with paying for Dexilant  Discussed counseling and medication review by pharmacist, referral sent to embedded Pharm D  Patient Goals/Self-Activities:  Over the next 30 days, patient will:  -work with embedded Pharm D for assistance with medication cost of Dexilant -continue to take medications exactly as prescribed -continue to call pharmacy for refills when supply is getting low and prior to running out of medication     Follow Up Plan: Telephone follow up appointment with care management team member scheduled for: 08/16/20    Care Plan : Hypertension (Adult)  Updates made by Lynne Logan, RN since 07/07/2020 12:00 AM    Problem: Disease Progression (Hypertension)    Priority: Medium    Long-Range Goal: Disease Progression Prevented or Minimized   Start Date: 07/05/2020  Expected End Date: 10/03/2020  This Visit's Progress: On track  Priority: Medium  Note:   Objective:  . Last practice recorded BP readings:  BP Readings from Last 3 Encounters:  05/24/20 120/84  05/20/20 112/84  05/18/20 138/80 .   Marland Kitchen Most recent eGFR/CrCl: No results found for: EGFR  No components found for: CRCL Current Barriers:  Marland Kitchen Knowledge Deficits related to basic understanding of hypertension pathophysiology and self care management . Knowledge Deficits related to understanding of medications prescribed for management of hypertension Case Manager Clinical Goal(s):  Marland Kitchen Over the next 90 days, patient will demonstrate improved health management independence as evidenced by checking blood pressure as directed and notifying PCP if SBP>130 or DBP > 80, taking all medications as prescribe, and adhering to a low sodium diet as discussed. Interventions:  . Collaboration with [name], FNP regarding development and update of comprehensive plan of care as evidenced by provider attestation and co-signature . Inter-disciplinary care team collaboration (see longitudinal plan of care) . Evaluation of current treatment plan related to hypertension self management and patient's adherence to plan as established by provider . Determined patient has a home BP cuff and feels comfortable with using the BP cuff, she admits she does not remember to check her BP  . Educated on dietary and exercise recommendations, Educated with rationale on importance of adhering to low Sodium diet  . Reviewed medications with patient and discussed importance of compliance . Advised patient, providing education and rationale, to monitor blood pressure daily and record, calling PCP for findings outside established parameters.  . Mailed printed BP Log, What is High Blood Pressure?; Why Should I Lower Sodium?   . Discussed plans with patient for ongoing care management follow up and provided patient with direct contact information for care management team Patient Goals/Self-Care Activities . Over the next 90 days, patient will:  write blood pressure results in a log or diary  utilize mailed BP log to record BP readings to review with RN CM at next call  review printed educational materials related to BP management  Self administers medications as prescribed Attends all scheduled provider appointments Calls provider office for new concerns, questions, or BP outside discussed parameters Checks BP and records as discussed Follows a low sodium diet/DASH diet  Follow Up Plan: Telephone follow up appointment with care management team member scheduled for:  08/16/20   Care Plan : Nonspecific Low Back Pain (Adult)  Updates made by Lynne Logan, RN since 07/07/2020 12:00 AM    Problem: Acute Low Back Pain   Priority: High    Goal: Acute Low Back Pain Managed   Start Date:  07/05/2020  Expected End Date: 10/03/2020  This Visit's Progress: On track  Priority: High  Note:   Current Barriers:  Marland Kitchen Knowledge Deficits related to self-health management of acute or chronic pain . Chronic Disease Management support and education needs related to chronic pain Clinical Goal(s):  Marland Kitchen Over the next 30 days, patient will verbalize understanding of plan for impinged nerve to lumber spine. Interventions:  . Collaboration with [name], FNP regarding development and update of comprehensive plan of care as evidenced by provider attestation and co-signature . Evaluation of current treatment plan related to impinged nerve pain and patient's adherence to plan as established by provider . pain assessed . effectiveness of pharmacologic therapy monitored . Determined patient was referred to Neurosurgeon for evaluation and treatment of impinged nerve . Determined patient is using a cane for ambulation, denies falls   . Reviewed scheduled/upcoming provider appointments including: Neurosurgeon consultation is scheduled for 07/17/20, patient was offered earlier time and may call to request the earlier time offered  . Discussed plans with patient for ongoing care management follow up and provided patient with direct contact information for care management team Patient Goals/Self Care Activities:  -Follow up with Neurosurgeon as recommended for evaluation and treatment of impinged nerve to lumbar spine -Seek medical attention promptly if symptoms persist or worsen   Follow Up Plan: Telephone follow up appointment with care management team member scheduled for: 08/16/20      Plan:Telephone follow up appointment with care management team member scheduled for:  08/16/20  Barb Merino, RN, BSN, CCM Care Management Coordinator [place] [facility]  Direct Phone: [phone]

## 2020-07-07 NOTE — Chronic Care Management (AMB) (Signed)
  Chronic Care Management   Note  07/07/2020 Name: Terri Barajas MRN: [id number] DOB: February 20, 1946  Terri Barajas is a 75 y.o. year old female who is a primary care patient of [name], [place]. Terri Barajas is currently enrolled in care management services. An additional referral for Pharmacy  was placed.   Follow up plan: Telephone appointment with care management team member scheduled for:08/02/2020  [place] [place] [facility]  Direct Dial [phone]

## 2020-07-13 ENCOUNTER — Ambulatory Visit: Payer: Medicare Other

## 2020-07-13 ENCOUNTER — Encounter: Payer: Medicare Other | Admitting: Nurse Practitioner

## 2020-07-13 DIAGNOSIS — I700 Atherosclerosis of aorta: Secondary | ICD-10-CM

## 2020-07-13 DIAGNOSIS — I10 Essential (primary) hypertension: Secondary | ICD-10-CM

## 2020-07-13 NOTE — Patient Instructions (Signed)
   Goals we discussed today:  Goals Addressed            This Visit's Progress   . Work with SW to manage care coordination needs   Not on track    Timeframe:  Long-Range Goal Priority:  High Start Date:  12.10.21                           Expected End Date:  3.10.22                      Next expected date of contact: 3.21.22  Patient Goals/Self-Care Activities Over the next 60 days, patient will:   - Patient will self administer medications as prescribed -Patient will attend all scheduled provider appointments -Patient will call provider office for new concerns or questions -Review mailed resource information -Contact SW as needed prior to next scheduled call

## 2020-07-13 NOTE — Chronic Care Management (AMB) (Signed)
Chronic Care Management    Social Work Note  07/13/2020 Name: Terri Barajas MRN: [id number] DOB: [birth year]  Terri Barajas is a 75 y.o. year old female who is a primary care patient of [name], [place]. The CCM team was consulted to assist the patient with chronic disease management and/or care coordination needs related to: [employer] .   Engaged with patient by telephone for follow up visit in response to provider referral for social work chronic care management and care coordination services.   Consent to Services:  The patient was given information about Chronic Care Management services, agreed to services, and gave verbal consent prior to initiation of services.  Please see initial visit note for detailed documentation.   Patient agreed to services and consent obtained.   Assessment: Review of patient past medical history, allergies, medications, and health status, including review of relevant consultants reports was performed today as part of a comprehensive evaluation and provision of chronic care management and care coordination services.     SDOH (Social Determinants of Health) assessments and interventions performed:    Advanced Directives Status: Not addressed in this encounter.  CCM Care Plan  Allergies  Allergen Reactions  . Atracurium & Derivatives Other (See Comments)    Unknown  . Atropine Other (See Comments)    Heart racing  . Iodine Other (See Comments)    Unknown  . Shellfish Allergy Other (See Comments)    Unknown  . Statins     "didn't feel right"    Outpatient Encounter Medications as of 07/13/2020  Medication Sig Note  . acetaminophen-codeine (TYLENOL #2) 300-15 MG tablet Take 1 tablet by mouth every 4 (four) hours as needed for moderate pain.   Marland Kitchen ALPRAZolam (XANAX) 0.5 MG tablet Take 0.5 tablets (0.25 mg total) by mouth daily as needed for anxiety.   Marland Kitchen aspirin 81 MG chewable tablet Chew 81 mg by mouth daily.   . benzonatate  (TESSALON) 200 MG capsule TAKE 1 CAPSULE BY MOUTH 2 TIMES DAILY AS NEEDED FOR COUGH.   Marland Kitchen BIOTIN PO Take 1 tablet by mouth daily.    . brimonidine (ALPHAGAN) 0.2 % ophthalmic solution Place 1 drop into both eyes in the morning and at bedtime.   . Cholecalciferol (VITAMIN D3) 25 MCG (1000 UT) CAPS Take 1,000 Units by mouth daily.   Marland Kitchen DEXILANT 60 MG capsule TAKE 1 CAPSULE BY MOUTH  DAILY   . diltiazem (CARDIZEM) 30 MG tablet Take 1 tablet (30 mg total) by mouth 2 (two) times daily.   . diphenhydrAMINE (BENADRYL) 25 MG tablet Take 25 mg by mouth as needed.   . dorzolamide-timolol (COSOPT) 22.3-6.8 MG/ML ophthalmic solution Place 1 drop into both eyes 2 (two) times daily.   Marland Kitchen EPINEPHrine 0.3 mg/0.3 mL IJ SOAJ injection Inject 0.3 mg into the muscle as needed.  05/05/2020: On hand  . fluticasone (FLONASE) 50 MCG/ACT nasal spray Place 2 sprays into both nostrils daily.   Marland Kitchen gabapentin (NEURONTIN) 100 MG capsule Take 1 capsule (100 mg total) by mouth 3 (three) times daily.   . hydrochlorothiazide (HYDRODIURIL) 25 MG tablet TAKE 1 TABLET BY MOUTH  DAILY   . ibuprofen (ADVIL) 800 MG tablet Take 1 tablet (800 mg total) by mouth 3 (three) times daily.   Marland Kitchen latanoprost (XALATAN) 0.005 % ophthalmic solution Place 1 drop into both eyes at bedtime.    . Multiple Vitamins-Minerals (CENTRUM SILVER 50+WOMEN PO) Take 1 tablet by mouth daily.   . Omega-3 Fatty Acids (  FISH OIL PO) Take 1 capsule by mouth daily.   . polycarbophil (FIBERCON) 625 MG tablet Take 625 mg by mouth daily.   . polyethylene glycol (MIRALAX / GLYCOLAX) packet Take 17 g by mouth daily.   . predniSONE (DELTASONE) 20 MG tablet Take 2 tablets by mouth once a day in am x 3 days   . tiZANidine (ZANAFLEX) 2 MG tablet Take 1 tablet (2 mg total) by mouth every 8 (eight) hours as needed for muscle spasms.    No facility-administered encounter medications on file as of 07/13/2020.    Patient Active Problem List   Diagnosis Date Noted  . OSA on CPAP  01/06/2019  . Abnormal CT of the abdomen 10/04/2018  . Radiculopathy affecting upper extremity, left 07/25/2018  . Hyperlipidemia 05/13/2018  . Situational anxiety 04/29/2018  . MVP (mitral valve prolapse) 04/29/2018  . Sleep-disordered breathing 04/29/2018  . Family History of Hyperthermia, malignant 04/23/2018  . Barrett's esophagus 03/01/2018  . Elevated lipase 02/18/2018  . Hiatal hernia 02/18/2018  . Constipation 02/18/2018  . Bloating 02/18/2018  . Aortic atherosclerosis ([place]) 01/23/2018  . Gastroesophageal reflux disease without esophagitis   . Glaucoma   . Hypertension     Conditions to be addressed/monitored: HTN and aortic atherosclerosis; home modification needs  Care Plan : Social Work Care Plan  Updates made by [name] since 07/13/2020 12:00 AM    Problem: Care Coordination   Priority: Medium    Goal: Assist with home modification needs   Start Date: 05/26/2020  Expected End Date: 08/24/2020  This Visit's Progress: Not on track  Recent Progress: On track  Priority: High  Note:   Current Barriers:  . Lacks knowledge of community resources to assist with home modification needs . Chronic conditions including HTN and Aortic Atherosclerosis which put patient at increased risk of hospitalization  Social Work Clinical Goal(s):  Marland Kitchen Over the next 45 days the patient will work with SW to identify resources to assist with home modification needs  CCM SW Interventions: . 1:1 collaboration with [name], [place] regarding development and update of comprehensive plan of care as evidenced by provider attestation and co-signature . Inter-disciplinary care team collaboration (see longitudinal plan of care) . Successful outbound call placed to the patient to assess goal progression . Determined the patient has yet to review mailing stating she is to have a surgery "tomorrow" and has not looked at her mail in quite some time . Provided verbal education on [place] . Discussed the patient is still interested in accessing services at a later date . Scheduled follow up call over the next 60 days   Patient Goals/Self-Care Activities Over the next 60 days, patient will:   - Patient will self administer medications as prescribed -Patient will attend all scheduled provider appointments -Patient will call provider office for new concerns or questions -Review mailed resource information -Contact SW as needed prior to next scheduled call  Follow up Plan: SW will follow up with patient by phone over the next 60 days       Follow Up Plan: SW will follow up with patient by phone over the next 60 days.      [name], BSW, CDP Social Worker, Certified Dementia Practitioner [place] [facility] [place] [facility] [phone]  Total time spent performing care coordination and/or care management activities with the patient by phone or face to face = 8 minutes.

## 2020-07-14 DIAGNOSIS — M5126 Other intervertebral disc displacement, lumbar region: Secondary | ICD-10-CM | POA: Diagnosis not present

## 2020-07-14 HISTORY — PX: BACK SURGERY: SHX140

## 2020-07-18 DIAGNOSIS — G4733 Obstructive sleep apnea (adult) (pediatric): Secondary | ICD-10-CM | POA: Diagnosis not present

## 2020-07-20 ENCOUNTER — Encounter: Payer: Medicare Other | Admitting: Nurse Practitioner

## 2020-07-27 ENCOUNTER — Telehealth: Payer: Self-pay

## 2020-07-27 NOTE — Chronic Care Management (AMB) (Signed)
Chronic Care Management Pharmacy Assistant   Name: Terri Barajas  MRN: [id number] DOB: 1945-10-20  Reason for Encounter: Medication Review/Initial Questions for Pharmacist visit on 08/02/20.  Patient Questions:  1.  Have you seen any other providers since your last visit? Yes, 07/13/20-Humble, Kendra (CCM). 07/05/20-Little, Claudette Stapler, RN (CCM). 06/28/20-Humble, Kendra (CCM). 06/24/20-Paul, Verdon Cummins, NP (OV). 06/01/20-Paul, Verdon Cummins, NP (OV). 05/26/20-Little, Claudette Stapler, RN (CCM). 05/26/20-Humble, Kendra (CCM). 05/24/20-Moore, Doreene Burke, FNP (OV). 05/20/20-Dykstra, Ellwood Dense, MD (ED). 05/18/20-Allen, Marissa Calamity, LPN ([place]). 05/05/20-Mansouraty, Telford Nab., MD (OV). 05/04/20-Yu, Illene Silver, PA-C (ED). 03/02/20-Brunson, Tiffany, NP (OV). 08/31/21Marshell Garfinkel, MD (OV). 02/07/20-Moore, Doreene Burke, FNP (OV).     2.  Any changes in your medicines or health? Yes, 05/24/20-OxyCODONE-Acetaminophen 10-325 MG (added), predniSONE 20 MG (added). 05/04/20-Ibuprofen 800 mg (added), tiZANidine HCI 2 mg (added). 02/15/20-Fluticasone Propionate 50 MCG/ACT (added).      PCP : [name], FNP  Allergies:   Allergies  Allergen Reactions   Atracurium & Derivatives Other (See Comments)    Unknown   Atropine Other (See Comments)    Heart racing   Iodine Other (See Comments)    Unknown   Shellfish Allergy Other (See Comments)    Unknown   Statins     "didn't feel right"    Medications: Outpatient Encounter Medications as of 07/27/2020  Medication Sig Note   acetaminophen-codeine (TYLENOL #2) 300-15 MG tablet Take 1 tablet by mouth every 4 (four) hours as needed for moderate pain.    ALPRAZolam (XANAX) 0.5 MG tablet Take 0.5 tablets (0.25 mg total) by mouth daily as needed for anxiety.    aspirin 81 MG chewable tablet Chew 81 mg by mouth daily.    benzonatate (TESSALON) 200 MG capsule TAKE 1 CAPSULE BY MOUTH 2 TIMES DAILY AS NEEDED FOR COUGH.    BIOTIN PO Take 1 tablet by mouth daily.     brimonidine  (ALPHAGAN) 0.2 % ophthalmic solution Place 1 drop into both eyes in the morning and at bedtime.    Cholecalciferol (VITAMIN D3) 25 MCG (1000 UT) CAPS Take 1,000 Units by mouth daily.    DEXILANT 60 MG capsule TAKE 1 CAPSULE BY MOUTH  DAILY    diltiazem (CARDIZEM) 30 MG tablet Take 1 tablet (30 mg total) by mouth 2 (two) times daily.    diphenhydrAMINE (BENADRYL) 25 MG tablet Take 25 mg by mouth as needed.    dorzolamide-timolol (COSOPT) 22.3-6.8 MG/ML ophthalmic solution Place 1 drop into both eyes 2 (two) times daily.    EPINEPHrine 0.3 mg/0.3 mL IJ SOAJ injection Inject 0.3 mg into the muscle as needed.  05/05/2020: On hand   fluticasone (FLONASE) 50 MCG/ACT nasal spray Place 2 sprays into both nostrils daily.    gabapentin (NEURONTIN) 100 MG capsule Take 1 capsule (100 mg total) by mouth 3 (three) times daily.    hydrochlorothiazide (HYDRODIURIL) 25 MG tablet TAKE 1 TABLET BY MOUTH  DAILY    ibuprofen (ADVIL) 800 MG tablet Take 1 tablet (800 mg total) by mouth 3 (three) times daily.    latanoprost (XALATAN) 0.005 % ophthalmic solution Place 1 drop into both eyes at bedtime.     Multiple Vitamins-Minerals (CENTRUM SILVER 50+WOMEN PO) Take 1 tablet by mouth daily.    Omega-3 Fatty Acids (FISH OIL PO) Take 1 capsule by mouth daily.    polycarbophil (FIBERCON) 625 MG tablet Take 625 mg by mouth daily.    polyethylene glycol (MIRALAX / GLYCOLAX) packet Take 17 g by mouth daily.  predniSONE (DELTASONE) 20 MG tablet Take 2 tablets by mouth once a day in am x 3 days    tiZANidine (ZANAFLEX) 2 MG tablet Take 1 tablet (2 mg total) by mouth every 8 (eight) hours as needed for muscle spasms.    No facility-administered encounter medications on file as of 07/27/2020.    Current Diagnosis: Patient Active Problem List   Diagnosis Date Noted   OSA on CPAP 01/06/2019   Abnormal CT of the abdomen 10/04/2018   Radiculopathy affecting upper extremity, left 07/25/2018   Hyperlipidemia  05/13/2018   Situational anxiety 04/29/2018   MVP (mitral valve prolapse) 04/29/2018   Sleep-disordered breathing 04/29/2018   Family History of Hyperthermia, malignant 04/23/2018   Barrett's esophagus 03/01/2018   Elevated lipase 02/18/2018   Hiatal hernia 02/18/2018   Constipation 02/18/2018   Bloating 02/18/2018   Aortic atherosclerosis ([place]) 01/23/2018   Gastroesophageal reflux disease without esophagitis    Glaucoma    Hypertension    Have you seen any other providers since your last visit? Yes. 07/14/20-Dr. Ashok Pall at [facility].  Any changes in your medications or health? Yes. Repair surgery for chipped disc in middle of back. Patient states Dr. Christella Noa discharged her with the following medications: Hydrocodone-Acetaminophen 5.325 MG PRN, Flexeril 10 mg.  Any side effects from any medications? Yes. Patient states symptoms of feeling sleepy and headaches from Hydrocodone-Acetaminophen 5-325 MG PRN and Flexeril 10 mg.  Do you have an symptoms or problems not managed by your medications? No.  Any concerns about your health right now? No.  Has your provider asked that you check blood pressure, blood sugar, or follow special diet at home? No.  Do you get any type of exercise on a regular basis? Before surgery patient states she was walking and stretching.   Can you think of a goal you would like to reach for your health? Not at the moment.   Do you have any problems getting your medications? No.  Is there anything that you would like to discuss during the appointment? Not right off hand per patient.   Please bring medications and supplements to appointment. Patient made aware to bring medications and supplements to phone visit.   Follow-Up:  Pharmacist Review-Patient voiced she has stopped taking these medications: oxyCODONE-acetaminophen (PERCOCET) 10-325 MG tablet predniSONE (DELTASONE) 20 MG tablet ibuprofen (ADVIL) 800 MG tablet tiZANidine  (ZANAFLEX) 2 MG tablet Acetaminophen-COD #2- Last Filled 06/02/20 prescribed by Dr. Laurance Flatten. Per patient.   [name], CPP Notified.  Raynelle Highland, [place] Pharmacist Assistant [phone]

## 2020-08-02 ENCOUNTER — Ambulatory Visit (INDEPENDENT_AMBULATORY_CARE_PROVIDER_SITE_OTHER): Payer: Medicare Other

## 2020-08-02 DIAGNOSIS — I10 Essential (primary) hypertension: Secondary | ICD-10-CM

## 2020-08-02 DIAGNOSIS — I700 Atherosclerosis of aorta: Secondary | ICD-10-CM

## 2020-08-02 DIAGNOSIS — K219 Gastro-esophageal reflux disease without esophagitis: Secondary | ICD-10-CM

## 2020-08-02 NOTE — Progress Notes (Signed)
Chronic Care Management Pharmacy Note  08/10/2020 Name:  Terri Barajas MRN:  [id number] DOB:  [birth year]  Subjective: Terri Barajas is an 75 y.o. year old female who is a primary patient of [name], [place].  The CCM team was consulted for assistance with disease management and care coordination needs.    Terri Barajas was born in [place], [place]. She moved to [place] when she was 72 and met her husband. Daughter Baxter Flattery who is a special needs educator in [place], [place]. She has two grand children who are 1 and 31 years old. The   Engaged with patient by telephone for initial visit in response to provider referral for pharmacy case management and/or care coordination services.    Consent to Services:  The patient was given the following information about Chronic Care Management services today, agreed to services, and gave verbal consent: 1. CCM service includes personalized support from designated clinical staff supervised by the primary care provider, including individualized plan of care and coordination with other care providers 2. 24/7 contact phone numbers for assistance for urgent and routine care needs. 3. Service will only be billed when office clinical staff spend 20 minutes or more in a month to coordinate care. 4. Only one practitioner may furnish and bill the service in a calendar month. 5.The patient may stop CCM services at any time (effective at the end of the month) by phone call to the office staff. 6. The patient will be responsible for cost sharing (co-pay) of up to 20% of the service fee (after annual deductible is met). Patient agreed to services and consent obtained.  Patient Care Team: [name], FNP as PCP - General ([place]) Skeet Latch, MD as PCP - Cardiology (Cardiology) Rossie Muskrat, MD as Referring Physician (Podiatry) [name] as Social Worker Mayford Knife, [facility] (Pharmacist)  Recent office visits:   03/03/2020 OV-  Your bone density is normal, be sure to take at least 2,000 mcg of vitamin d and 1200 mg of calcium daily for good bone health.   Recent consult visits: 07/06/2020 OV: Disc displacement  Hospital visits: ED Visit 05/20/2020 - back pain concerning for sciatica  Objective:  Lab Results  Component Value Date   CREATININE 0.82 03/01/2020   BUN 21 03/01/2020   GFR 90.57 08/03/2019   GFRNONAA 72 03/01/2020   GFRAA 83 03/01/2020   NA 140 03/01/2020   K 4.0 03/01/2020   CALCIUM 10.0 03/01/2020   CO2 30 (H) 03/01/2020    Lab Results  Component Value Date/Time   HGBA1C 6.0 05/27/2018 10:45 AM   GFR 90.57 08/03/2019 01:40 PM   GFR 66.53 05/27/2018 10:45 AM    Last diabetic Eye exam: No results found for: HMDIABEYEEXA  Last diabetic Foot exam: No results found for: HMDIABFOOTEX   Lab Results  Component Value Date   CHOL 270 (H) 05/27/2018   HDL 54.70 05/27/2018   LDLCALC 177 (H) 05/27/2018   TRIG 191.0 (H) 05/27/2018   CHOLHDL 5 05/27/2018    Hepatic Function Latest Ref Rng & Units 08/03/2019 07/10/2018 05/27/2018  Total Protein 6.0 - 8.3 g/dL 7.1 7.3 7.2  Albumin 3.5 - 5.2 g/dL 4.0 4.0 4.3  AST 0 - 37 U/L 17 21 23   ALT 0 - 35 U/L 13 20 24   Alk Phosphatase 39 - 117 U/L 79 61 81  Total Bilirubin 0.2 - 1.2 mg/dL 0.5 0.6 0.5  Bilirubin, Direct 0.0 - 0.3 mg/dL - - -  Lab Results  Component Value Date/Time   TSH 1.22 08/03/2019 01:40 PM   TSH 1.21 05/27/2018 10:45 AM   FREET4 0.74 05/27/2018 10:45 AM    CBC Latest Ref Rng & Units 02/15/2020 08/03/2019 07/10/2018  WBC 4.0 - 10.5 K/uL 5.7 6.7 6.3  Hemoglobin 12.0 - 15.0 g/dL 13.3 12.0 12.9  Hematocrit 36.0 - 46.0 % 39.2 36.1 39.9  Platelets 150.0 - 400.0 K/uL 248.0 216.0 234    No results found for: VD25OH  Clinical ASCVD: Yes  The 10-year ASCVD risk score Mikey Bussing DC Jr., et al., 2013) is: 15%   Values used to calculate the score:     Age: 51 years     Sex: Female     Is Non-Hispanic African American: Yes     Diabetic:  No     Tobacco smoker: No     Systolic Blood Pressure: 161 mmHg     Is BP treated: Yes     HDL Cholesterol: 54.7 mg/dL     Total Cholesterol: 270 mg/dL    Depression screen [facility] 2/9 05/18/2020 11/04/2019 07/12/2019  Decreased Interest 1 0 3  Down, Depressed, Hopeless 1 0 1  PHQ - 2 Score 2 0 4  Altered sleeping - - 0  Tired, decreased energy - - 3  Change in appetite - - 0  Feeling bad or failure about yourself  - - 0  Trouble concentrating - - 0  Moving slowly or fidgety/restless - - 0  Suicidal thoughts - - 0  PHQ-9 Score - - 7  Difficult doing work/chores - - -      Social History   Tobacco Use  Smoking Status Never Smoker  Smokeless Tobacco Never Used   BP Readings from Last 3 Encounters:  05/24/20 120/84  05/20/20 112/84  05/18/20 138/80   Pulse Readings from Last 3 Encounters:  05/24/20 95  05/20/20 87  05/18/20 83   Wt Readings from Last 3 Encounters:  05/24/20 149 lb 12.8 oz (67.9 kg)  05/18/20 149 lb 6.4 oz (67.8 kg)  05/05/20 150 lb 4 oz (68.2 kg)    Assessment/Interventions: Review of patient past medical history, allergies, medications, health status, including review of consultants reports, laboratory and other test data, was performed as part of comprehensive evaluation and provision of chronic care management services.   SDOH:  (Social Determinants of Health) assessments and interventions performed: Yes   CCM Care Plan  Allergies  Allergen Reactions  . Atracurium & Derivatives Other (See Comments)    Unknown  . Atropine Other (See Comments)    Heart racing  . Iodine Other (See Comments)    Unknown  . Shellfish Allergy Other (See Comments)    Unknown  . Statins     "didn't feel right"    Medications Reviewed Today    Reviewed by Mayford Knife, RPH (Pharmacist) on 08/02/20 at 0921  Med List Status: <None>  Medication Order Taking? Sig Documenting Provider Last Dose Status Informant  acetaminophen-codeine (TYLENOL #2) 300-15 MG tablet  [id number] Yes Take 1 tablet by mouth every 4 (four) hours as needed for moderate pain. [name], FNP Taking Active   ALPRAZolam Duanne Moron) 0.5 MG tablet [id number] Yes Take 0.5 tablets (0.25 mg total) by mouth daily as needed for anxiety. Briscoe Deutscher, DO Taking Active   aspirin 81 MG chewable tablet [id number] Yes Chew 81 mg by mouth daily. [provider] Taking Active Self  benzonatate (TESSALON) 200 MG capsule [id number] No TAKE 1 CAPSULE BY  MOUTH 2 TIMES DAILY AS NEEDED FOR COUGH.  Patient not taking: Reported on 08/02/2020   Marshell Garfinkel, MD Not Taking Active   BIOTIN PO [id number] Yes Take 1 tablet by mouth daily.  [provider] Taking Active Self  brimonidine (ALPHAGAN) 0.2 % ophthalmic solution [id number] No Place 1 drop into both eyes in the morning and at bedtime.  Patient not taking: Reported on 08/02/2020   [provider] Not Taking Active   Cholecalciferol (VITAMIN D3) 25 MCG (1000 UT) CAPS [id number] Yes Take 1,000 Units by mouth daily. [provider] Taking Active Self  DEXILANT 60 MG capsule [id number] Yes TAKE 1 CAPSULE BY MOUTH  DAILY Mansouraty, Telford Nab., MD Taking Active   diltiazem (CARDIZEM) 30 MG tablet [id number] Yes Take 1 tablet (30 mg total) by mouth 2 (two) times daily. Skeet Latch, MD Taking Active   diphenhydrAMINE (BENADRYL) 25 MG tablet [id number] No Take 25 mg by mouth as needed.  Patient not taking: Reported on 08/02/2020   [provider] Not Taking Active   dorzolamide-timolol (COSOPT) 22.3-6.8 MG/ML ophthalmic solution [id number] Yes Place 1 drop into both eyes 2 (two) times daily. [provider] Taking Active   EPINEPHrine 0.3 mg/0.3 mL IJ SOAJ injection [id number] No Inject 0.3 mg into the muscle as needed.   Patient not taking: Reported on 08/02/2020   [provider] Not Taking Active            Med Note (DAVIS, SOPHIA A   Fri May 05, 2020 11:17 AM) On hand  fluticasone (FLONASE) 50  MCG/ACT nasal spray [id number] Yes Place 2 sprays into both nostrils daily. Marshell Garfinkel, MD Taking Active   gabapentin (NEURONTIN) 100 MG capsule [id number] No Take 1 capsule (100 mg total) by mouth 3 (three) times daily.  Patient not taking: Reported on 08/02/2020   [name], FNP Not Taking Active   hydrochlorothiazide (HYDRODIURIL) 25 MG tablet [id number] Yes TAKE 1 TABLET BY MOUTH  DAILY [name], FNP Taking Active   ibuprofen (ADVIL) 800 MG tablet [id number] No Take 1 tablet (800 mg total) by mouth 3 (three) times daily.  Patient not taking: Reported on 08/02/2020   Ok Edwards, PA-C Not Taking Active   latanoprost (XALATAN) 0.005 % ophthalmic solution [id number] Yes Place 1 drop into both eyes at bedtime.  [provider] Taking Active Self  Multiple Vitamins-Minerals (CENTRUM SILVER 50+WOMEN PO) [id number] Yes Take 1 tablet by mouth daily. [provider] Taking Active Self  Omega-3 Fatty Acids (FISH OIL PO) [id number] Yes Take 1 capsule by mouth daily. [provider] Taking Active Self  polycarbophil (FIBERCON) 625 MG tablet [id number] Yes Take 625 mg by mouth daily. [provider] Taking Active Self  polyethylene glycol Merril Abbe / GLYCOLAX) packet [id number] Yes Take 17 g by mouth daily. [provider] Taking Active   predniSONE (DELTASONE) 20 MG tablet [id number]  Take 2 tablets by mouth once a day in am x 3 days [name], FNP  Active   tiZANidine (ZANAFLEX) 2 MG tablet [id number]  Take 1 tablet (2 mg total) by mouth every 8 (eight) hours as needed for muscle spasms. [name], FNP  Active           Patient Active Problem List   Diagnosis Date Noted  . OSA on CPAP 01/06/2019  . Abnormal CT of the abdomen 10/04/2018  . Radiculopathy affecting upper extremity, left 07/25/2018  . Hyperlipidemia 05/13/2018  . Situational anxiety 04/29/2018  . MVP (mitral  valve prolapse) 04/29/2018  . Sleep-disordered breathing 04/29/2018  .  Family History of Hyperthermia, malignant 04/23/2018  . Barrett's esophagus 03/01/2018  . Elevated lipase 02/18/2018  . Hiatal hernia 02/18/2018  . Constipation 02/18/2018  . Bloating 02/18/2018  . Aortic atherosclerosis ([place]) 01/23/2018  . Gastroesophageal reflux disease without esophagitis   . Glaucoma   . Hypertension     Immunization History  Administered Date(s) Administered  . Fluad Quad(high Dose 65+) 03/11/2019  . Influenza, High Dose Seasonal PF 04/29/2018  . Influenza-Unspecified 03/16/2020  . PFIZER(Purple Top)SARS-COV-2 Vaccination 09/02/2019, 09/28/2019, 03/27/2020  . Pneumococcal Conjugate-13 03/11/2019  . Pneumococcal Polysaccharide-23 06/09/2017    Conditions to be addressed/monitored:  Hypertension, Hyperlipidemia, GERD, Anxiety and Allergic Rhinitis  Care Plan : [place]  Updates made by Mayford Knife, RPH since 08/10/2020 12:00 AM    Problem: HTN, HLD, ANXIETY, GERD, CONSTIPATION, ALLERGIC RHINITIS   Priority: High    Long-Range Goal: Disease Management   This Visit's Progress: On track  Priority: High  Note:    Current Barriers:  . Unable to independently afford treatment regimen . Unable to achieve control of LDL cholesterol    Pharmacist Clinical Goal(s):  Marland Kitchen Over the next 30 days, patient will achieve adherence to monitoring guidelines and medication adherence to achieve therapeutic efficacy through collaboration with PharmD and provider.    Interventions: . 1:1 collaboration with [name], FNP regarding development and update of comprehensive plan of care as evidenced by provider attestation and co-signature . Inter-disciplinary care team collaboration (see longitudinal plan of care) . Comprehensive medication review performed; medication list updated in electronic medical record  Hypertension (BP goal <130/80) -controlled -Current treatment: . Diltiazem 30 mg tablet twice per day  . Hydrochlorothiazide 25 mg tablet   daily  -Current home readings: none at this time, because she stopped checking her BP because of surgery  -Current dietary habits: she does not eat pork, she tries to eat very little red meat, she enjoys chicken, [place] and fish, she feels like she needs to eat more vegetables and fruit. She enjoy vegetables including okra, collard greens, cabbage - she uses smoked [place]. She also makes sure to read labels. She uses Complete Seasoning-made by Jilda Roche, garlic powder, black pepper.  -Current exercise habits: prior to surgery she [place] [facility] They also have a track close to where they live and a stable bike in the house. She walks up and down the driveway about once a week or not at all. She is thinking about joining the gym through [employer].  -Denies hypotensive/hypertensive symptoms -Educated on BP goals and benefits of medications for prevention of heart attack, stroke and kidney damage; Daily salt intake goal < 2300 mg; Exercise goal of 150 minutes per week; -Counseled to monitor BP at home three times a week to everyday, document, and provide log at future appointments -Counseled on diet and exercise extensively Recommended to continue current medication  Hyperlipidemia: (LDL goal < 70) -uncontrolled -Current treatment: . Aspirin 81 mg tablet daily   -Medications previously tried: none noted   -Current dietary patterns: Limiting fried and fatty foods -  -Current exercise habits: prior to surgery she and her [place] [facility]. They also have a track close to where they live and a stable bike in the house. She walks up and down the driveway about once a week or not at all. She is thinking about joining the gym through [employer].  -  Educated on Cholesterol goals;  Importance of limiting foods high in cholesterol; Exercise goal of 150 minutes per week; Discussed issues with statins in the past that it made her feel crazy.   She felt jittery and nervous  and her head did not feel right.  -Collaborated with PCP team to start patient on Nexlizet or Nexletol.   Anxiety (Goal: reduce symptoms ) -controlled -Current treatment: . Alprazolam 0.5 mg tablet one half daily as needed  -GAD7: 6  -Not interested in connecting with anyone for mental health  Self manages symptoms of anxiety -Educated on Benefits of medication for symptom control -Recommended to continue current medication  GERD   -controlled -Current treatment   Dexilant 60 mg capsule - taking 1 daily   Over $200 dollars a month   Prescribed and managed by GI doctor  -Educated on patient assistance programs.  Patient would like help with patient assistance. Will send signed and completed paperwork to GI doctor for him to sign Dr. Donald Prose  Constipation  -controlled -Current treatment  . Miralax/Polyeth . Fibercon -Recommended to continue current medication  Allergic Rhinitis  -controlled -Current treatment  . Claritin 10 mg - taking daily  . Flonase 50 mcg- place 2 sprays in both nostrils daily  o Uses as needed -Recommended to continue current medication   Health Maintenance  -Current therapy:  . Centrum Silver Womens Multivitamin . Omega 3 Fatty Acids daily . Fibercon 625 mg by mouth daily  . Calcium 600 mg + Vitamin D 125  . Naturmade Vitamin D3- 250 mcg daily  . [place] Calcium  . Repressa 0.02% - once a day in both eyes (opthamologist)  -Educated on Herbal supplement research is limited and benefits usually cannot be proven Cost vs benefit of each product must be carefully weighed by individual consumer Supplements may interfere with prescription drugs -Patient is satisfied with current therapy and denies issues -Recommended to continue current medication   Patient Goals/Self-Care Activities . Over the next 90 days, patient will:  - take medications as prescribed check blood pressure every day, document, and provide at future  appointments collaborate with provider on medication access solutions  Follow Up Plan: Telephone follow up appointment with care management team member scheduled for: The patient has been provided with contact information for the care management team and has been advised to call with any health related questions or concerns.  Next PCP appointment scheduled for: 08/18/2020      Medication Assistance: Application for Dexilant  medication assistance program. in process.  Anticipated assistance start date 08/2020.  See plan of care for additional detail.  Patient's preferred pharmacy is:  [id number]Phone: [phone]: [phone] [place] [place] [address] [place]Phone: [phone]: [phone] Uses pill box? Yes - Uses it once a week, and she fills it once a week.  Pt endorses 100% compliance  We discussed: Benefits of medication synchronization, packaging and delivery as well as enhanced pharmacist oversight with [employer]. Patient decided to: Continue current medication management strategy  Care Plan and Follow Up Patient Decision:  Patient agrees to Care Plan and Follow-up.  Plan: Telephone follow up appointment with care management team member scheduled for:  08/31/2020  VOrlando Penner PharmD Clinical Pharmacist [facility] [phone]

## 2020-08-03 ENCOUNTER — Encounter: Payer: Medicare Other | Admitting: Nurse Practitioner

## 2020-08-07 ENCOUNTER — Encounter: Payer: Self-pay | Admitting: Nurse Practitioner

## 2020-08-07 DIAGNOSIS — M25562 Pain in left knee: Secondary | ICD-10-CM | POA: Diagnosis not present

## 2020-08-10 NOTE — Patient Instructions (Signed)
Visit Information It was great speaking with you today!  Please let me know if you have any questions about our visit.  Goals Addressed            This Visit's Progress   . Manage My Medicine       Timeframe:  Long-Range Goal Priority:  High Start Date:                             Expected End Date:                       Follow Up Date 08/31/2020   - call for medicine refill 2 or 3 days before it runs out - call if I am sick and can't take my medicine - keep a list of all the medicines I take; vitamins and herbals too - use a pillbox to sort medicine    Why is this important?   . These steps will help you keep on track with your medicines.          Patient Care Plan: Assist with Chronic Care Management and Care Coordination Needs    Problem Identified: Assist with Chronic Care Management and Care Coordination Needs Resolved 07/05/2020  Priority: High  Onset Date: 05/26/2020  Note:   Current Barriers:   Ineffective Self Health Maintenance  Knowledge Deficits related to how to obtain grab bars and shower chair   Currently UNABLE TO independently self manage needs related to chronic health conditions.   Knowledge Deficits related to short term plan for care coordination needs and long term plans for chronic disease management needs  Nurse Case Manager Clinical Goal(s):   Over the next 60 days, patient will work with care management team to address care coordination and chronic disease management needs related to Disease Management  Educational Needs  Care Coordination    Interventions:   Initial CCM RN CM call completed with patient. Please see additional care plan goals.   Follow Up Plan: Telephone follow up appointment with care management team member scheduled for: 08/16/20   Patient Care Plan: Social Work Care Plan    Problem Identified: Care Coordination   Priority: Medium    Goal: Assist with home modification needs   Start Date: 05/26/2020  Expected  End Date: 08/24/2020  This Visit's Progress: Not on track  Recent Progress: On track  Priority: High  Note:   Current Barriers:  . Lacks knowledge of community resources to assist with home modification needs . Chronic conditions including HTN and Aortic Atherosclerosis which put patient at increased risk of hospitalization  Social Work Clinical Goal(s):  Marland Kitchen Over the next 45 days the patient will work with SW to identify resources to assist with home modification needs  CCM SW Interventions: . 1:1 collaboration with [name], [place] regarding development and update of comprehensive plan of care as evidenced by provider attestation and co-signature . Inter-disciplinary care team collaboration (see longitudinal plan of care) . Successful outbound call placed to the patient to assess goal progression . Determined the patient has yet to review mailing stating she is to have a surgery "tomorrow" and has not looked at her mail in quite some time . Provided verbal education on [place] . Discussed the patient is still interested in accessing services at a later date . Scheduled follow up call over the next 60 days   Patient Goals/Self-Care Activities Over the  next 60 days, patient will:   - Patient will self administer medications as prescribed -Patient will attend all scheduled provider appointments -Patient will call provider office for new concerns or questions -Review mailed resource information -Contact SW as needed prior to next scheduled call  Follow up Plan: SW will follow up with patient by phone over the next 60 days    Patient Care Plan: Medication Adherence (Maintained)    Problem Identified: Medication Adherence (Wellness)   Priority: Medium    Goal: Medication Adherence Maintained   Start Date: 07/05/2020  Expected End Date: 08/18/2020  This Visit's Progress: On track  Priority: Medium  Note:   Current Barriers:   Ineffective Self Health  Maintenance  Currently UNABLE TO independently self manage needs related to chronic health conditions.   Knowledge Deficits related to short term plan for care coordination needs and long term plans for chronic disease management needs Case Manager Clinical Goal(s):  Marland Kitchen Collaboration with [name], FNP regarding development and update of comprehensive plan of care as evidenced by provider attestation and co-signature . Inter-disciplinary care team collaboration (see longitudinal plan of care)  Over the next 90 days, patient will work with care management team to address care coordination and chronic disease management needs related to Disease Management  Educational Needs  Care Coordination  Medication Management and Education  Psychosocial Support   Interventions:   Assessed for medication adherence including barriers to medication adherence  Determined patient is adherent to taking her medications as prescribed w/o missed doses  Determined patient is experiencing financial difficulty with paying for Dexilant  Discussed counseling and medication review by pharmacist, referral sent to embedded Pharm D  Patient Goals/Self-Activities:  Over the next 30 days, patient will:  -work with embedded Pharm D for assistance with medication cost of Dexilant -continue to take medications exactly as prescribed -continue to call pharmacy for refills when supply is getting low and prior to running out of medication     Follow Up Plan: Telephone follow up appointment with care management team member scheduled for: 08/16/20    Patient Care Plan: Hypertension (Adult)    Problem Identified: Disease Progression (Hypertension)   Priority: Medium    Long-Range Goal: Disease Progression Prevented or Minimized   Start Date: 07/05/2020  Expected End Date: 10/03/2020  This Visit's Progress: On track  Priority: Medium  Note:   Objective:  . Last practice recorded BP readings:  BP Readings from  Last 3 Encounters:  05/24/20 120/84  05/20/20 112/84  05/18/20 138/80 .   Marland Kitchen Most recent eGFR/CrCl: No results found for: EGFR  No components found for: CRCL Current Barriers:  Marland Kitchen Knowledge Deficits related to basic understanding of hypertension pathophysiology and self care management . Knowledge Deficits related to understanding of medications prescribed for management of hypertension Case Manager Clinical Goal(s):  Marland Kitchen Over the next 90 days, patient will demonstrate improved health management independence as evidenced by checking blood pressure as directed and notifying PCP if SBP>130 or DBP > 80, taking all medications as prescribe, and adhering to a low sodium diet as discussed. Interventions:  . Collaboration with [name], FNP regarding development and update of comprehensive plan of care as evidenced by provider attestation and co-signature . Inter-disciplinary care team collaboration (see longitudinal plan of care) . Evaluation of current treatment plan related to hypertension self management and patient's adherence to plan as established by provider . Determined patient has a home BP cuff and feels comfortable with using the BP cuff,  she admits she does not remember to check her BP  . Educated on dietary and exercise recommendations, Educated with rationale on importance of adhering to low Sodium diet  . Reviewed medications with patient and discussed importance of compliance . Advised patient, providing education and rationale, to monitor blood pressure daily and record, calling PCP for findings outside established parameters.  . Mailed printed BP Log, What is High Blood Pressure?; Why Should I Lower Sodium?  . Discussed plans with patient for ongoing care management follow up and provided patient with direct contact information for care management team Patient Goals/Self-Care Activities . Over the next 90 days, patient will:  write blood pressure results in a log or diary   utilize mailed BP log to record BP readings to review with RN CM at next call  review printed educational materials related to BP management  Self administers medications as prescribed Attends all scheduled provider appointments Calls provider office for new concerns, questions, or BP outside discussed parameters Checks BP and records as discussed Follows a low sodium diet/DASH diet  Follow Up Plan: Telephone follow up appointment with care management team member scheduled for:  08/16/20   Patient Care Plan: Nonspecific Low Back Pain (Adult)    Problem Identified: Acute Low Back Pain   Priority: High    Goal: Acute Low Back Pain Managed   Start Date: 07/05/2020  Expected End Date: 10/03/2020  This Visit's Progress: On track  Priority: High  Note:   Current Barriers:  Marland Kitchen Knowledge Deficits related to self-health management of acute or chronic pain . Chronic Disease Management support and education needs related to chronic pain Clinical Goal(s):  Marland Kitchen Over the next 30 days, patient will verbalize understanding of plan for impinged nerve to lumber spine. Interventions:  . Collaboration with [name], FNP regarding development and update of comprehensive plan of care as evidenced by provider attestation and co-signature . Evaluation of current treatment plan related to impinged nerve pain and patient's adherence to plan as established by provider . pain assessed . effectiveness of pharmacologic therapy monitored . Determined patient was referred to Neurosurgeon for evaluation and treatment of impinged nerve . Determined patient is using a cane for ambulation, denies falls  . Reviewed scheduled/upcoming provider appointments including: Neurosurgeon consultation is scheduled for 07/17/20, patient was offered earlier time and may call to request the earlier time offered  . Discussed plans with patient for ongoing care management follow up and provided patient with direct contact information  for care management team Patient Goals/Self Care Activities:  -Follow up with Neurosurgeon as recommended for evaluation and treatment of impinged nerve to lumbar spine -Seek medical attention promptly if symptoms persist or worsen   Follow Up Plan: Telephone follow up appointment with care management team member scheduled for: 08/16/20           Patient Care Plan: CCM Pharmacy Care Plan    Problem Identified: HTN, HLD, ANXIETY, GERD, CONSTIPATION, ALLERGIC RHINITIS   Priority: High    Long-Range Goal: Disease Management   This Visit's Progress: On track  Priority: High  Note:    Current Barriers:  . Unable to independently afford treatment regimen . Unable to achieve control of LDL cholesterol    Pharmacist Clinical Goal(s):  Marland Kitchen Over the next 30 days, patient will achieve adherence to monitoring guidelines and medication adherence to achieve therapeutic efficacy through collaboration with PharmD and provider.    Interventions: . 1:1 collaboration with [name], FNP regarding development and update  of comprehensive plan of care as evidenced by provider attestation and co-signature . Inter-disciplinary care team collaboration (see longitudinal plan of care) . Comprehensive medication review performed; medication list updated in electronic medical record  Hypertension (BP goal <130/80) -controlled -Current treatment: . Diltiazem 30 mg tablet twice per day  . Hydrochlorothiazide 25 mg tablet  daily  -Current home readings: none at this time, because she stopped checking her BP because of surgery  -Current dietary habits: she does not eat pork, she tries to eat very little red meat, she enjoys chicken, [place] and fish, she feels like she needs to eat more vegetables and fruit. She enjoy vegetables including okra, collard greens, cabbage - she uses smoked [place]. She also makes sure to read labels. She uses Complete Seasoning-made by Jilda Roche, garlic powder, black pepper.   -Current exercise habits: prior to surgery she [place] her [place] [facility] They also have a track close to where they live and a stable bike in the house. She walks up and down the driveway about once a week or not at all. She is thinking about joining the gym through [employer].  -Denies hypotensive/hypertensive symptoms -Educated on BP goals and benefits of medications for prevention of heart attack, stroke and kidney damage; Daily salt intake goal < 2300 mg; Exercise goal of 150 minutes per week; -Counseled to monitor BP at home three times a week to everyday, document, and provide log at future appointments -Counseled on diet and exercise extensively Recommended to continue current medication  Hyperlipidemia: (LDL goal < 70) -uncontrolled -Current treatment: . Aspirin 81 mg tablet daily   -Medications previously tried: none noted   -Current dietary patterns: Limiting fried and fatty foods -  -Current exercise habits: prior to surgery she and her [place] [facility]. They also have a track close to where they live and a stable bike in the house. She walks up and down the driveway about once a week or not at all. She is thinking about joining the gym through [employer].  -Educated on Cholesterol goals;  Importance of limiting foods high in cholesterol; Exercise goal of 150 minutes per week; Discussed issues with statins in the past that it made her feel crazy.   She felt jittery and nervous and her head did not feel right.  -Collaborated with PCP team to start patient on Nexlizet or Nexletol.   Anxiety (Goal: reduce symptoms ) -controlled -Current treatment: . Alprazolam 0.5 mg tablet one half daily as needed  -GAD7: 6  -Not interested in connecting with anyone for mental health  Self manages symptoms of anxiety -Educated on Benefits of medication for symptom control -Recommended to continue current medication  GERD   -controlled -Current treatment    Dexilant 60 mg capsule - taking 1 daily   Over $200 dollars a month   Prescribed and managed by GI doctor  -Educated on patient assistance programs.  Patient would like help with patient assistance. Will send signed and completed paperwork to GI doctor for him to sign Dr. Donald Prose  Constipation  -controlled -Current treatment  . Miralax/Polyeth . Fibercon -Recommended to continue current medication  Allergic Rhinitis  -controlled -Current treatment  . Claritin 10 mg - taking daily  . Flonase 50 mcg- place 2 sprays in both nostrils daily  o Uses as needed -Recommended to continue current medication   Health Maintenance  -Current therapy:  . Centrum Silver Womens Multivitamin . Omega 3 Fatty Acids daily .  Fibercon 625 mg by mouth daily  . Calcium 600 mg + Vitamin D 125  . [place] Vitamin D3- 250 mcg daily  . [place] Calcium  . Repressa 0.02% - once a day in both eyes (opthamologist)  -Educated on Herbal supplement research is limited and benefits usually cannot be proven Cost vs benefit of each product must be carefully weighed by individual consumer Supplements may interfere with prescription drugs -Patient is satisfied with current therapy and denies issues -Recommended to continue current medication   Patient Goals/Self-Care Activities . Over the next 90 days, patient will:  - take medications as prescribed check blood pressure every day, document, and provide at future appointments collaborate with provider on medication access solutions  Follow Up Plan: Telephone follow up appointment with care management team member scheduled for: The patient has been provided with contact information for the care management team and has been advised to call with any health related questions or concerns.  Next PCP appointment scheduled for: 08/18/2020      Ms. Wassenaar was given information about Chronic Care Management services today including:  1. CCM  service includes personalized support from designated clinical staff supervised by her physician, including individualized plan of care and coordination with other care providers 2. 24/7 contact phone numbers for assistance for urgent and routine care needs. 3. Standard insurance, coinsurance, copays and deductibles apply for chronic care management only during months in which we provide at least 20 minutes of these services. Most insurances cover these services at 100%, however patients may be responsible for any copay, coinsurance and/or deductible if applicable. This service may help you avoid the need for more expensive face-to-face services. 4. Only one practitioner may furnish and bill the service in a calendar month. 5. The patient may stop CCM services at any time (effective at the end of the month) by phone call to the office staff.  Patient agreed to services and verbal consent obtained.   The patient verbalized understanding of instructions, educational materials, and care plan provided today and agreed to receive a mailed copy of patient instructions, educational materials, and care plan.   [name], PharmD Clinical Pharmacist [facility] [phone]

## 2020-08-11 DIAGNOSIS — I10 Essential (primary) hypertension: Secondary | ICD-10-CM | POA: Diagnosis not present

## 2020-08-11 DIAGNOSIS — H401132 Primary open-angle glaucoma, bilateral, moderate stage: Secondary | ICD-10-CM | POA: Diagnosis not present

## 2020-08-16 ENCOUNTER — Telehealth: Payer: Medicare Other

## 2020-08-16 ENCOUNTER — Telehealth: Payer: Self-pay

## 2020-08-16 NOTE — Telephone Encounter (Signed)
  Chronic Care Management   Outreach Note  08/16/2020 Name: Terri Barajas MRN: [id number] DOB: [birth year]  Referred by: [name], [place] Reason for referral : Chronic Care Management (RN CM FU Call Attempt)   An unsuccessful telephone outreach was attempted today. The patient was referred to the case management team for assistance with care management and care coordination.   Follow Up Plan: A HIPAA compliant phone message was left for the patient providing contact information and requesting a return call. Telephone follow up appointment with care management team member scheduled for: 09/13/20  Barb Merino, RN, BSN, CCM Care Management Coordinator [place] [facility]  Direct Phone: [phone]

## 2020-08-17 ENCOUNTER — Encounter: Payer: Self-pay | Admitting: Nurse Practitioner

## 2020-08-18 ENCOUNTER — Encounter: Payer: Self-pay | Admitting: Nurse Practitioner

## 2020-08-18 ENCOUNTER — Ambulatory Visit (INDEPENDENT_AMBULATORY_CARE_PROVIDER_SITE_OTHER): Payer: Medicare Other | Admitting: Nurse Practitioner

## 2020-08-18 ENCOUNTER — Other Ambulatory Visit: Payer: Self-pay

## 2020-08-18 VITALS — BP 128/68 | HR 90 | Temp 97.7°F | Ht 62.8 in | Wt 143.0 lb

## 2020-08-18 DIAGNOSIS — M5432 Sciatica, left side: Secondary | ICD-10-CM | POA: Diagnosis not present

## 2020-08-18 DIAGNOSIS — M25562 Pain in left knee: Secondary | ICD-10-CM | POA: Diagnosis not present

## 2020-08-18 DIAGNOSIS — I10 Essential (primary) hypertension: Secondary | ICD-10-CM

## 2020-08-18 DIAGNOSIS — Z13220 Encounter for screening for lipoid disorders: Secondary | ICD-10-CM | POA: Diagnosis not present

## 2020-08-18 DIAGNOSIS — Z136 Encounter for screening for cardiovascular disorders: Secondary | ICD-10-CM

## 2020-08-18 DIAGNOSIS — R7303 Prediabetes: Secondary | ICD-10-CM

## 2020-08-18 DIAGNOSIS — E559 Vitamin D deficiency, unspecified: Secondary | ICD-10-CM

## 2020-08-18 DIAGNOSIS — Z0000 Encounter for general adult medical examination without abnormal findings: Secondary | ICD-10-CM

## 2020-08-18 LAB — POCT URINALYSIS DIPSTICK
Bilirubin, UA: NEGATIVE
Blood, UA: NEGATIVE
Glucose, UA: NEGATIVE
Ketones, UA: NEGATIVE
Leukocytes, UA: NEGATIVE
Nitrite, UA: NEGATIVE
Protein, UA: NEGATIVE
Spec Grav, UA: 1.02 (ref 1.010–1.025)
Urobilinogen, UA: 0.2 E.U./dL
pH, UA: 7.5 (ref 5.0–8.0)

## 2020-08-18 LAB — POCT UA - MICROALBUMIN
Albumin/Creatinine Ratio, Urine, POC: 30
Creatinine, POC: 100 mg/dL
Microalbumin Ur, POC: 30 mg/L

## 2020-08-18 NOTE — Patient Instructions (Signed)
Health Maintenance, Female Adopting a healthy lifestyle and getting preventive care are important in promoting health and wellness. Ask your health care provider about:  The right schedule for you to have regular tests and exams.  Things you can do on your own to prevent diseases and keep yourself healthy. What should I know about diet, weight, and exercise? Eat a healthy diet  Eat a diet that includes plenty of vegetables, fruits, low-fat dairy products, and lean protein.  Do not eat a lot of foods that are high in solid fats, added sugars, or sodium.   Maintain a healthy weight Body mass index (BMI) is used to identify weight problems. It estimates body fat based on height and weight. Your health care provider can help determine your BMI and help you achieve or maintain a healthy weight. Get regular exercise Get regular exercise. This is one of the most important things you can do for your health. Most adults should:  Exercise for at least 150 minutes each week. The exercise should increase your heart rate and make you sweat (moderate-intensity exercise).  Do strengthening exercises at least twice a week. This is in addition to the moderate-intensity exercise.  Spend less time sitting. Even light physical activity can be beneficial. Watch cholesterol and blood lipids Have your blood tested for lipids and cholesterol at 73 years of age, then have this test every 5 years. Have your cholesterol levels checked more often if:  Your lipid or cholesterol levels are high.  You are older than 73 years of age.  You are at high risk for heart disease. What should I know about cancer screening? Depending on your health history and family history, you may need to have cancer screening at various ages. This may include screening for:  Breast cancer.  Cervical cancer.  Colorectal cancer.  Skin cancer.  Lung cancer. What should I know about heart disease, diabetes, and high blood  pressure? Blood pressure and heart disease  High blood pressure causes heart disease and increases the risk of stroke. This is more likely to develop in people who have high blood pressure readings, are of African descent, or are overweight.  Have your blood pressure checked: ? Every 3-5 years if you are 18-39 years of age. ? Every year if you are 40 years old or older. Diabetes Have regular diabetes screenings. This checks your fasting blood sugar level. Have the screening done:  Once every three years after age 40 if you are at a normal weight and have a low risk for diabetes.  More often and at a younger age if you are overweight or have a high risk for diabetes. What should I know about preventing infection? Hepatitis B If you have a higher risk for hepatitis B, you should be screened for this virus. Talk with your health care provider to find out if you are at risk for hepatitis B infection. Hepatitis C Testing is recommended for:  Everyone born from 1945 through 1965.  Anyone with known risk factors for hepatitis C. Sexually transmitted infections (STIs)  Get screened for STIs, including gonorrhea and chlamydia, if: ? You are sexually active and are younger than 73 years of age. ? You are older than 73 years of age and your health care provider tells you that you are at risk for this type of infection. ? Your sexual activity has changed since you were last screened, and you are at increased risk for chlamydia or gonorrhea. Ask your health care provider   if you are at risk.  Ask your health care provider about whether you are at high risk for HIV. Your health care provider may recommend a prescription medicine to help prevent HIV infection. If you choose to take medicine to prevent HIV, you should first get tested for HIV. You should then be tested every 3 months for as long as you are taking the medicine. Pregnancy  If you are about to stop having your period (premenopausal) and  you may become pregnant, seek counseling before you get pregnant.  Take 400 to 800 micrograms (mcg) of folic acid every day if you become pregnant.  Ask for birth control (contraception) if you want to prevent pregnancy. Osteoporosis and menopause Osteoporosis is a disease in which the bones lose minerals and strength with aging. This can result in bone fractures. If you are 65 years old or older, or if you are at risk for osteoporosis and fractures, ask your health care provider if you should:  Be screened for bone loss.  Take a calcium or vitamin D supplement to lower your risk of fractures.  Be given hormone replacement therapy (HRT) to treat symptoms of menopause. Follow these instructions at home: Lifestyle  Do not use any products that contain nicotine or tobacco, such as cigarettes, e-cigarettes, and chewing tobacco. If you need help quitting, ask your health care provider.  Do not use street drugs.  Do not share needles.  Ask your health care provider for help if you need support or information about quitting drugs. Alcohol use  Do not drink alcohol if: ? Your health care provider tells you not to drink. ? You are pregnant, may be pregnant, or are planning to become pregnant.  If you drink alcohol: ? Limit how much you use to 0-1 drink a day. ? Limit intake if you are breastfeeding.  Be aware of how much alcohol is in your drink. In the [place] one drink equals one 12 oz bottle of beer (355 mL), one 5 oz glass of wine (148 mL), or one 1 oz glass of hard liquor (44 mL). General instructions  Schedule regular health, dental, and eye exams.  Stay current with your vaccines.  Tell your health care provider if: ? You often feel depressed. ? You have ever been abused or do not feel safe at home. Summary  Adopting a healthy lifestyle and getting preventive care are important in promoting health and wellness.  Follow your health care provider's instructions about healthy  diet, exercising, and getting tested or screened for diseases.  Follow your health care provider's instructions on monitoring your cholesterol and blood pressure. This information is not intended to replace advice given to you by your health care provider. Make sure you discuss any questions you have with your health care provider. Document Revised: 05/27/2018 Document Reviewed: 05/27/2018 Elsevier Patient Education  2021 [employer]  

## 2020-08-18 NOTE — Progress Notes (Signed)
Rutherford Nail as a Education administrator for [employer], NP.,have documented all relevant documentation on the behalf of [employer], NP,as directed by  [name], NP while in the presence of [name], NP. This visit occurred during the SARS-CoV-2 public health emergency.  Safety protocols were in place, including screening questions prior to the visit, additional usage of staff PPE, and extensive cleaning of exam room while observing appropriate contact time as indicated for disinfecting solutions.  Subjective:     Patient ID: Terri Barajas , female    DOB: Sep 08, 1945 , 75 y.o.   MRN: [id number]   Chief Complaint  Patient presents with  . Annual Exam    HPI  Pt is here today for a Health maintenance exam, she is compliant with all medications. She had surgery on her back. She had that surgery Jan. 28 2022. Her Leg pain started with the back pain. She said that the doctor that did her surgery also took a imaging of her leg which she never got an answer back from. She is still waiting to hear back from him about the imaging results   Diet: cherrios and bananas. Fruit loops. Fruits  Exercise: she walks often as much as she can with her leg pain.   Wt Readings from Last 3 Encounters: 08/18/20 : 143 lb (64.9 kg) 05/24/20 : 149 lb 12.8 oz (67.9 kg) 05/18/20 : 149 lb 6.4 oz (67.8 kg)     Past Medical History:  Diagnosis Date  . Anxiety   . Arthritis   . Complication of anesthesia    some type of sedation medication made her feel crazy  . Dysrhythmia   . Family history of adverse reaction to anesthesia    daughter has Malignant Hyperthermia hx -   . GERD (gastroesophageal reflux disease)   . Glaucoma   . H/O: hysterectomy 1978  . Heart disease   . History of hiatal hernia   . Hypertension   . MVP (mitral valve prolapse)   . Panic attacks   . Pre-diabetes   . Sciatica      Family History  Problem Relation Age of Onset  . Pancreatic cancer Mother    . Alcohol abuse Father   . Asthma Sister   . Hypertension Sister   . Arthritis Sister   . Diabetes Brother   . Arthritis Brother   . Hyperlipidemia Brother   . Hypertension Brother   . Glaucoma Brother   . Colon polyps Brother   . Arthritis Sister   . Arthritis Sister   . Prostate cancer Brother   . Congenital heart disease Brother   . Colon cancer Neg Hx   . Esophageal cancer Neg Hx   . Inflammatory bowel disease Neg Hx   . Liver disease Neg Hx   . Rectal cancer Neg Hx   . Stomach cancer Neg Hx      Current Outpatient Medications:  .  ALPRAZolam (XANAX) 0.5 MG tablet, Take 0.5 tablets (0.25 mg total) by mouth daily as needed for anxiety., Disp: 45 tablet, Rfl: 0 .  aspirin 81 MG chewable tablet, Chew 81 mg by mouth daily., Disp: , Rfl:  .  BIOTIN PO, Take 1 tablet by mouth daily. , Disp: , Rfl:  .  brimonidine (ALPHAGAN) 0.2 % ophthalmic solution, Place 1 drop into both eyes in the morning and at bedtime., Disp: , Rfl:  .  Cholecalciferol (VITAMIN D3) 25 MCG (1000 UT) CAPS, Take 1,000 Units by mouth daily., Disp: ,  Rfl:  .  DEXILANT 60 MG capsule, TAKE 1 CAPSULE BY MOUTH  DAILY, Disp: 90 capsule, Rfl: 3 .  diltiazem (CARDIZEM) 30 MG tablet, Take 1 tablet (30 mg total) by mouth 2 (two) times daily., Disp: 180 tablet, Rfl: 3 .  fluticasone (FLONASE) 50 MCG/ACT nasal spray, Place 2 sprays into both nostrils daily., Disp: 16 g, Rfl: 2 .  hydrochlorothiazide (HYDRODIURIL) 25 MG tablet, TAKE 1 TABLET BY MOUTH  DAILY, Disp: 90 tablet, Rfl: 3 .  ibuprofen (ADVIL) 800 MG tablet, Take 1 tablet (800 mg total) by mouth 3 (three) times daily., Disp: 21 tablet, Rfl: 0 .  latanoprost (XALATAN) 0.005 % ophthalmic solution, Place 1 drop into both eyes at bedtime. , Disp: , Rfl:  .  Multiple Vitamins-Minerals (CENTRUM SILVER 50+WOMEN PO), Take 1 tablet by mouth daily., Disp: , Rfl:  .  Omega-3 Fatty Acids (FISH OIL PO), Take 1 capsule by mouth daily., Disp: , Rfl:  .  polycarbophil (FIBERCON)  625 MG tablet, Take 625 mg by mouth daily., Disp: , Rfl:  .  polyethylene glycol (MIRALAX / GLYCOLAX) packet, Take 17 g by mouth daily., Disp: , Rfl:  .  dorzolamide-timolol (COSOPT) 22.3-6.8 MG/ML ophthalmic solution, Place 1 drop into both eyes 2 (two) times daily. (Patient not taking: Reported on 08/18/2020), Disp: , Rfl:  .  gabapentin (NEURONTIN) 100 MG capsule, Take 1 capsule (100 mg total) by mouth 3 (three) times daily. (Patient not taking: Reported on 08/18/2020), Disp: 90 capsule, Rfl: 2   Allergies  Allergen Reactions  . Atracurium & Derivatives Other (See Comments)    Unknown  . Atropine Other (See Comments)    Heart racing  . Iodine Other (See Comments)    Unknown  . Shellfish Allergy Other (See Comments)    Unknown  . Statins     "didn't feel right"      The patient states she usesfor birth control. Last LMP was No LMP recorded (lmp unknown). Patient has had a hysterectomy. Negative for: breast discharge, breast lump(s), breast pain and breast self exam. Associated symptoms include abnormal vaginal bleeding. Pertinent negatives include abnormal bleeding (hematology), anxiety, decreased libido, depression, difficulty falling sleep, dyspareunia, history of infertility, nocturia, sexual dysfunction, sleep disturbances, urinary incontinence, urinary urgency, vaginal discharge and vaginal itching. Diet regular.The patient states her exercise level is    . [name] tobacco use is:  Social History   Tobacco Use  Smoking Status Never Smoker  Smokeless Tobacco Never Used  . She has been exposed to passive smoke. [name] alcohol use is:  Social History   Substance and Sexual Activity  Alcohol Use Not Currently    Review of Systems  Constitutional: Negative.  Negative for fatigue.  HENT: Negative.  Negative for congestion, sinus pressure and sore throat.   Respiratory: Negative for chest tightness and shortness of breath.   Cardiovascular: Negative for chest pain and  palpitations.  Gastrointestinal: Negative for constipation, diarrhea and vomiting.  Endocrine: Negative for polydipsia, polyphagia and polyuria.  Musculoskeletal: Negative.  Negative for arthralgias and myalgias.  Skin: Negative.   Neurological: Positive for numbness. Negative for dizziness, weakness and headaches.       Numbness to her left leg at times   Psychiatric/Behavioral: Negative.      Today's Vitals   08/18/20 1048  BP: 128/68  Pulse: 90  Temp: 97.7 F (36.5 C)  TempSrc: Oral  Weight: 143 lb (64.9 kg)  Height: 5' 2.8" (1.595 m)   Body mass index  is 25.49 kg/m.   Objective:  Physical Exam Vitals reviewed.  Constitutional:      General: She is not in acute distress.    Appearance: Normal appearance. She is obese.  HENT:     Head: Normocephalic.     Right Ear: Tympanic membrane, ear canal and external ear normal. There is no impacted cerumen.     Left Ear: Tympanic membrane, ear canal and external ear normal. There is no impacted cerumen.     Nose: Nose normal. No congestion.     Mouth/Throat:     Mouth: Mucous membranes are moist.  Eyes:     Extraocular Movements: Extraocular movements intact.     Conjunctiva/sclera: Conjunctivae normal.     Pupils: Pupils are equal, round, and reactive to light.  Cardiovascular:     Rate and Rhythm: Normal rate and regular rhythm.     Pulses: Normal pulses.     Heart sounds: Normal heart sounds. No murmur heard.   Pulmonary:     Effort: Pulmonary effort is normal. No respiratory distress.     Breath sounds: Normal breath sounds. No wheezing.  Musculoskeletal:        General: Tenderness present.     Comments: Tenderness to her left knee   Skin:    General: Skin is warm and dry.     Capillary Refill: Capillary refill takes less than 2 seconds.  Neurological:     General: No focal deficit present.     Mental Status: She is alert and oriented to person, place, and time.     Cranial Nerves: No cranial nerve deficit.   Psychiatric:        Mood and Affect: Mood normal.        Behavior: Behavior normal.        Thought Content: Thought content normal.        Judgment: Judgment normal.        Assessment And Plan:     1. Annual physical exam -Discussed with patient the importance of screenings, immunization and taking multivitamins.  -Educated and encouraged patient to eat well balanced heart healthy diet which includes low fat, low sodium. Decrease intake of red meats. Increase intake of fish and fiber.   2. Primary hypertension -BP controlled today, chronic, stable  -Will check labs  - POCT Urinalysis Dipstick (81002) - POCT UA - Microalbumin - EKG 12-Lead - CMP14+EGFR - CBC  3. Left sciatic nerve pain -Patient has had this pain, she recently had back surgery in Jan.  -Advised patient to make an appointment with her surgeon for a follow up  -Patient will start taking gabapentin 100 mg   4. Vitamin D deficiency -Will check and supplement  - VITAMIN D 25 Hydroxy (Vit-D Deficiency, Fractures)  5. Prediabetes - Hemoglobin A1c  6. Acute pain of left knee -Patient will consider doing physical therapy  -Recommended patient to follow up with her back surgeon  -Recommended patient for a referral for orthopedic   7. Encounter for lipid screening for cardiovascular disease -Encouraged a healthy diet consisting of fish and fiber. Decrease intake of fatty foods and dairy products. Avoid fast food.  -Continue taking Omega 3 PO  -Will check lipid panel  - Lipid panel   Follow up in 3 months   Patient was given opportunity to ask questions. Patient verbalized understanding of the plan and was able to repeat key elements of the plan. All questions were answered to their satisfaction.   [name], NP  I, [name], NP, have reviewed all documentation for this visit. The documentation on 08/18/20 for the exam, diagnosis, procedures, and orders are all accurate and complete.  THE  PATIENT IS ENCOURAGED TO PRACTICE SOCIAL DISTANCING DUE TO THE COVID-19 PANDEMIC.

## 2020-08-19 LAB — CMP14+EGFR
ALT: 16 IU/L (ref 0–32)
AST: 13 IU/L (ref 0–40)
Albumin/Globulin Ratio: 1.6 (ref 1.2–2.2)
Albumin: 4.2 g/dL (ref 3.7–4.7)
Alkaline Phosphatase: 83 IU/L (ref 44–121)
BUN/Creatinine Ratio: 25 (ref 12–28)
BUN: 20 mg/dL (ref 8–27)
Bilirubin Total: 0.5 mg/dL (ref 0.0–1.2)
CO2: 28 mmol/L (ref 20–29)
Calcium: 9.7 mg/dL (ref 8.7–10.3)
Chloride: 98 mmol/L (ref 96–106)
Creatinine, Ser: 0.79 mg/dL (ref 0.57–1.00)
Globulin, Total: 2.7 g/dL (ref 1.5–4.5)
Glucose: 110 mg/dL — ABNORMAL HIGH (ref 65–99)
Potassium: 3.4 mmol/L — ABNORMAL LOW (ref 3.5–5.2)
Sodium: 139 mmol/L (ref 134–144)
Total Protein: 6.9 g/dL (ref 6.0–8.5)
eGFR: 79 mL/min/{1.73_m2} (ref >59)

## 2020-08-19 LAB — VITAMIN D 25 HYDROXY (VIT D DEFICIENCY, FRACTURES): Vit D, 25-Hydroxy: 92.9 ng/mL (ref 30.0–100.0)

## 2020-08-19 LAB — CBC
Hematocrit: 38.4 % (ref 34.0–46.6)
Hemoglobin: 12.6 g/dL (ref 11.1–15.9)
MCH: 29.8 pg (ref 26.6–33.0)
MCHC: 32.8 g/dL (ref 31.5–35.7)
MCV: 91 fL (ref 79–97)
Platelets: 271 10*3/uL (ref 150–450)
RBC: 4.23 x10E6/uL (ref 3.77–5.28)
RDW: 12.3 % (ref 11.7–15.4)
WBC: 6.7 10*3/uL (ref 3.4–10.8)

## 2020-08-19 LAB — LIPID PANEL
Chol/HDL Ratio: 4.4 ratio (ref 0.0–4.4)
Cholesterol, Total: 262 mg/dL — ABNORMAL HIGH (ref 100–199)
HDL: 60 mg/dL (ref >39)
LDL Chol Calc (NIH): 175 mg/dL — ABNORMAL HIGH (ref 0–99)
Triglycerides: 150 mg/dL — ABNORMAL HIGH (ref 0–149)
VLDL Cholesterol Cal: 27 mg/dL (ref 5–40)

## 2020-08-19 LAB — HEMOGLOBIN A1C
Est. average glucose Bld gHb Est-mCnc: 134 mg/dL
Hgb A1c MFr Bld: 6.3 % — ABNORMAL HIGH (ref 4.8–5.6)

## 2020-08-23 ENCOUNTER — Other Ambulatory Visit: Payer: Self-pay | Admitting: Cardiovascular Disease

## 2020-08-24 ENCOUNTER — Other Ambulatory Visit: Payer: Self-pay | Admitting: Nurse Practitioner

## 2020-08-24 DIAGNOSIS — E782 Mixed hyperlipidemia: Secondary | ICD-10-CM

## 2020-08-24 NOTE — Progress Notes (Signed)
I am going to refer her to a lipid clinic because if she is not willing to take a statin then its going to be hard for [place] to lower her bad cholesterol with a non-statin drug. Lipid clinic will have different medication for her to try to lower her cholesterol.

## 2020-08-28 ENCOUNTER — Telehealth: Payer: Medicare Other | Admitting: Cardiovascular Disease

## 2020-08-30 ENCOUNTER — Telehealth: Payer: Self-pay

## 2020-08-30 NOTE — Progress Notes (Signed)
08/30/20-Patient confirmed CCM Call Visit appointment on Thursday, 08/31/20 at 10:00 AM with [name], CPP. Patient aware to have medications and supplements near during phone visit. Patient was unsure as to why she will need to have her medications with her during phone visit. I verbalized to the patient this is for the clinical pharmacist to review with her in case of any changes that have been made and will need to update and speak with the PCP about. The patient voiced understanding.   [name], CPP Notified.  Raynelle Highland, [place] Pharmacist Assistant [phone] [name]: 3 minutes

## 2020-08-31 ENCOUNTER — Telehealth: Payer: Self-pay

## 2020-09-04 ENCOUNTER — Ambulatory Visit: Payer: Medicare Other

## 2020-09-04 DIAGNOSIS — I700 Atherosclerosis of aorta: Secondary | ICD-10-CM

## 2020-09-04 DIAGNOSIS — I10 Essential (primary) hypertension: Secondary | ICD-10-CM

## 2020-09-04 NOTE — Patient Instructions (Signed)
Social Worker Visit Information  Goals we discussed today:  Goals Addressed            This Visit's Progress   . COMPLETED: Work with SW to manage care coordination needs       Timeframe:  Long-Range Goal Priority:  High Start Date:  12.10.21                           Expected End Date:  3.10.22                      3.21.22- Goal Closed  Patient Goals/Self-Care Activities Over the next 60 days, patient will:   - Patient will self administer medications as prescribed -Patient will attend all scheduled provider appointments -Patient will call provider office for new concerns or questions -Review mailed resource information -Contact SW as needed prior to next scheduled call        Materials Provided: No: Patient declined  Follow Up Plan: No SW follow up planned at this time.   [name], BSW, CDP Social Worker, Certified Dementia Practitioner [place] [facility] [place] [facility] [phone]

## 2020-09-04 NOTE — Chronic Care Management (AMB) (Signed)
Chronic Care Management    Social Work Note  09/04/2020 Name: Terri Barajas MRN: [id number] DOB: Sep 03, 1945  Terri Barajas is a 75 y.o. year old female who is a primary care patient of [name], [place]. The CCM team was consulted to assist the patient with chronic disease management and/or care coordination needs related to: [employer] .   Engaged with patient by telephone for follow up visit in response to provider referral for social work chronic care management and care coordination services.   Consent to Services:  The patient was given information about Chronic Care Management services, agreed to services, and gave verbal consent prior to initiation of services.  Please see initial visit note for detailed documentation.   Patient agreed to services and consent obtained.   Assessment: Review of patient past medical history, allergies, medications, and health status, including review of relevant consultants reports was performed today as part of a comprehensive evaluation and provision of chronic care management and care coordination services.     SDOH (Social Determinants of Health) assessments and interventions performed:    Advanced Directives Status: Not addressed in this encounter.  CCM Care Plan  Allergies  Allergen Reactions  . Atracurium & Derivatives Other (See Comments)    Unknown  . Atropine Other (See Comments)    Heart racing  . Iodine Other (See Comments)    Unknown  . Shellfish Allergy Other (See Comments)    Unknown  . Statins     "didn't feel right"    Outpatient Encounter Medications as of 09/04/2020  Medication Sig  . ALPRAZolam (XANAX) 0.5 MG tablet Take 0.5 tablets (0.25 mg total) by mouth daily as needed for anxiety.  Marland Kitchen aspirin 81 MG chewable tablet Chew 81 mg by mouth daily.  Marland Kitchen BIOTIN PO Take 1 tablet by mouth daily.   . brimonidine (ALPHAGAN) 0.2 % ophthalmic solution Place 1 drop into both eyes in the morning and at bedtime.  .  Cholecalciferol (VITAMIN D3) 25 MCG (1000 UT) CAPS Take 1,000 Units by mouth daily.  Marland Kitchen DEXILANT 60 MG capsule TAKE 1 CAPSULE BY MOUTH  DAILY  . diltiazem (CARDIZEM) 30 MG tablet TAKE 1 TABLET BY MOUTH  TWICE DAILY  . dorzolamide-timolol (COSOPT) 22.3-6.8 MG/ML ophthalmic solution Place 1 drop into both eyes 2 (two) times daily. (Patient not taking: Reported on 08/18/2020)  . fluticasone (FLONASE) 50 MCG/ACT nasal spray Place 2 sprays into both nostrils daily.  Marland Kitchen gabapentin (NEURONTIN) 100 MG capsule Take 1 capsule (100 mg total) by mouth 3 (three) times daily. (Patient not taking: Reported on 08/18/2020)  . hydrochlorothiazide (HYDRODIURIL) 25 MG tablet TAKE 1 TABLET BY MOUTH  DAILY  . ibuprofen (ADVIL) 800 MG tablet Take 1 tablet (800 mg total) by mouth 3 (three) times daily.  Marland Kitchen latanoprost (XALATAN) 0.005 % ophthalmic solution Place 1 drop into both eyes at bedtime.   . Multiple Vitamins-Minerals (CENTRUM SILVER 50+WOMEN PO) Take 1 tablet by mouth daily.  . Omega-3 Fatty Acids (FISH OIL PO) Take 1 capsule by mouth daily.  . polycarbophil (FIBERCON) 625 MG tablet Take 625 mg by mouth daily.  . polyethylene glycol (MIRALAX / GLYCOLAX) packet Take 17 g by mouth daily.   No facility-administered encounter medications on file as of 09/04/2020.    Patient Active Problem List   Diagnosis Date Noted  . OSA on CPAP 01/06/2019  . Abnormal CT of the abdomen 10/04/2018  . Radiculopathy affecting upper extremity, left 07/25/2018  . Hyperlipidemia 05/13/2018  .  Situational anxiety 04/29/2018  . MVP (mitral valve prolapse) 04/29/2018  . Sleep-disordered breathing 04/29/2018  . Family History of Hyperthermia, malignant 04/23/2018  . Barrett's esophagus 03/01/2018  . Elevated lipase 02/18/2018  . Hiatal hernia 02/18/2018  . Constipation 02/18/2018  . Bloating 02/18/2018  . Aortic atherosclerosis ([place]) 01/23/2018  . Gastroesophageal reflux disease without esophagitis   . Glaucoma   . Hypertension      Conditions to be addressed/monitored: HTN and Aortic Atherosclerosis; Home modification needs  Care Plan : Social Work Care Plan  Updates made by [name] since 09/04/2020 12:00 AM  Completed 09/04/2020  Problem: Care Coordination Resolved 09/04/2020  Priority: Medium    Goal: Assist with home modification needs Completed 09/04/2020  Start Date: 05/26/2020  Expected End Date: 08/24/2020  Recent Progress: Not on track  Priority: High  Note:   Current Barriers:  . Lacks knowledge of community resources to assist with home modification needs . Chronic conditions including HTN and Aortic Atherosclerosis which put patient at increased risk of hospitalization  Social Work Clinical Goal(s):  Marland Kitchen Over the next 45 days the patient will work with SW to identify resources to assist with home modification needs  CCM SW Interventions: . 1:1 collaboration with [name], [place] regarding development and update of comprehensive plan of care as evidenced by provider attestation and co-signature . Inter-disciplinary care team collaboration (see longitudinal plan of care) . Successful outbound call placed to the patient to assess goal progression . Determined the patient has yet to review mailed resource . Attempted to assist the patient in contacting [facility] to determine eligibility to obtain grab bars in her shower - patient declined . Assessed for SW needs - patient denied SW needs at this time and is not interested in future SW outreach   Patient Goals/Self-Care Activities Over the next 60 days, patient will:   - Patient will self administer medications as prescribed -Patient will attend all scheduled provider appointments -Patient will call provider office for new concerns or questions -Review mailed resource information -Contact SW as needed prior to next scheduled call  Follow up Plan: No SW follow up planned at this time       Follow Up Plan: No SW follow up planned at this  time.      [name], BSW, CDP Social Worker, Certified Dementia Practitioner [place] [facility] [place] [facility] [phone]  Total time spent performing care coordination and/or care management activities with the patient by phone or face to face = 15 minutes.

## 2020-09-13 ENCOUNTER — Telehealth: Payer: Medicare Other

## 2020-09-14 ENCOUNTER — Telehealth: Payer: Self-pay

## 2020-09-14 NOTE — Telephone Encounter (Signed)
Called pt about needed a f/u appt. Pt did not answer so a message was left to call us back at her earliest convenience.

## 2020-09-20 ENCOUNTER — Telehealth (INDEPENDENT_AMBULATORY_CARE_PROVIDER_SITE_OTHER): Payer: Medicare Other | Admitting: Internal Medicine

## 2020-09-20 ENCOUNTER — Encounter: Payer: Self-pay | Admitting: Internal Medicine

## 2020-09-20 ENCOUNTER — Telehealth: Payer: Self-pay

## 2020-09-20 ENCOUNTER — Telehealth: Payer: Self-pay | Admitting: Internal Medicine

## 2020-09-20 VITALS — BP 113/71 | HR 77 | Ht 63.0 in | Wt 144.0 lb

## 2020-09-20 DIAGNOSIS — I10 Essential (primary) hypertension: Secondary | ICD-10-CM

## 2020-09-20 DIAGNOSIS — I700 Atherosclerosis of aorta: Secondary | ICD-10-CM | POA: Diagnosis not present

## 2020-09-20 DIAGNOSIS — Z789 Other specified health status: Secondary | ICD-10-CM | POA: Diagnosis not present

## 2020-09-20 DIAGNOSIS — E782 Mixed hyperlipidemia: Secondary | ICD-10-CM | POA: Diagnosis not present

## 2020-09-20 NOTE — Telephone Encounter (Signed)
Faxed request for records pertaining to prior lipid management to [facility] ([facility]) HIM @ [phone] Patient saw Dr. Precious Haws (cardiology) with this group

## 2020-09-20 NOTE — Patient Instructions (Signed)
Medication Instructions:  Your physician recommends that you continue on your current medications as directed. Please refer to the Current Medication list given to you today.  *If you need a refill on your cardiac medications before your next appointment, please call your pharmacy*  * Any changes will depend on test results & records from your cardiologist  Testing/Procedures: Dr. Debara Pickett has ordered a CT coronary calcium score. This test is done at [address]. This is $99 out of pocket.   Coronary CalciumScan A coronary calcium scan is an imaging test used to look for deposits of calcium and other fatty materials (plaques) in the inner lining of the blood vessels of the heart (coronary arteries). These deposits of calcium and plaques can partly clog and narrow the coronary arteries without producing any symptoms or warning signs. This puts a person at risk for a heart attack. This test can detect these deposits before symptoms develop. Tell a health care provider about:  Any allergies you have.  All medicines you are taking, including vitamins, herbs, eye drops, creams, and over-the-counter medicines.  Any problems you or family members have had with anesthetic medicines.  Any blood disorders you have.  Any surgeries you have had.  Any medical conditions you have.  Whether you are pregnant or may be pregnant. What are the risks? Generally, this is a safe procedure. However, problems may occur, including:  Harm to a pregnant woman and her unborn baby. This test involves the use of radiation. Radiation exposure can be dangerous to a pregnant woman and her unborn baby. If you are pregnant, you generally should not have this procedure done.  Slight increase in the risk of cancer. This is because of the radiation involved in the test. What happens before the procedure? No preparation is needed for this procedure. What happens during the procedure?  You will  undress and remove any jewelry around your neck or chest.  You will put on a hospital gown.  Sticky electrodes will be placed on your chest. The electrodes will be connected to an electrocardiogram (ECG) machine to record a tracing of the electrical activity of your heart.  A CT scanner will take pictures of your heart. During this time, you will be asked to lie still and hold your breath for 2-3 seconds while a picture of your heart is being taken. The procedure may vary among health care providers and hospitals. What happens after the procedure?  You can get dressed.  You can return to your normal activities.  It is up to you to get the results of your test. Ask your health care provider, or the department that is doing the test, when your results will be ready. Summary  A coronary calcium scan is an imaging test used to look for deposits of calcium and other fatty materials (plaques) in the inner lining of the blood vessels of the heart (coronary arteries).  Generally, this is a safe procedure. Tell your health care provider if you are pregnant or may be pregnant.  No preparation is needed for this procedure.  A CT scanner will take pictures of your heart.  You can return to your normal activities after the scan is done. This information is not intended to replace advice given to you by your health care provider. Make sure you discuss any questions you have with your health care provider. Document Released: 11/30/2007 Document Revised: 04/22/2016 Document Reviewed: 04/22/2016 Elsevier Interactive Patient Education  2017 [employer].  Follow-Up: At [facility], you and your health needs are our priority.  As part of our continuing mission to provide you with exceptional heart care, we have created designated Provider Care Teams.  These Care Teams include your primary Cardiologist (physician) and Advanced Practice Providers (APPs -  Physician Assistants and Nurse  Practitioners) who all work together to provide you with the care you need, when you need it.  We recommend signing up for the patient portal called "MyChart".  Sign up information is provided on this After Visit Summary.  MyChart is used to connect with patients for Virtual Visits (Telemedicine).  Patients are able to view lab/test results, encounter notes, upcoming appointments, etc.  Non-urgent messages can be sent to your provider as well.   To learn more about what you can do with MyChart, go to [email or web address].    Your next appointment:   6 week(s) - after calcium score  The format for your next appointment:   In Person or Virtual  Provider:   Raliegh Ip Mali Hilty, MD - lipid clinic   Other Instructions

## 2020-09-20 NOTE — Progress Notes (Signed)
Virtual Visit via Video Note   This visit type was conducted due to national recommendations for restrictions regarding the COVID-19 Pandemic (e.g. social distancing) in an effort to limit this patient's exposure and mitigate transmission in our community.  Due to her co-morbid illnesses, this patient is at least at moderate risk for complications without adequate follow up.  This format is felt to be most appropriate for this patient at this time.  All issues noted in this document were discussed and addressed.  A limited physical exam was performed with this format.  Please refer to the patient's chart for her consent to telehealth for [facility].  Video Connection Lost Video connection was lost at < 50% of the duration of this visit, at which time the remainder of the visit was completed via audio only.    Date:  09/20/2020   ID:  NIKA YAZZIE, DOB [birth year], MRN [id number] The patient was identified using 2 identifiers.  Evaluation Performed:  New Patient Evaluation  Patient Location:  [place] [id number]  Provider location:   [address] [place], [place] [address]  PCP:  [name], FNP  Cardiologist:  Skeet Latch, MD Electrophysiologist:  None   Chief Complaint: Manage dyslipidemia  History of Present Illness:    RIANN OMAN is a 75 y.o. female who presents via audio/video conferencing for a telehealth visit today.  This is a pleasant 75 year old female kindly referred for evaluation and management of dyslipidemia.  She has a history of mitral valve prolapse, prediabetes, family history of heart disease which is remote and more recently was noted to have dyslipidemia.  She has tried a number of statins in the past but has been considered intolerant.  She was previously getting care from a cardiologist named Dr. Thereasa Parkin in [place].  She does not recall what statin she had tried or what she was intolerant to.  We  could not find that information in her current records but will try to contact him at [phone].  She had seen Dr. Oval Linsey in our practice last year for somewhat atypical chest pain.  A coronary CT angiogram was ordered however never performed.  She had also had COVID-19 in January 2021.  She had some persistent cough related to that and had seen pulmonary, Dr. Vaughan Browner, and a CT chest high-resolution was performed.  I personally reviewed these images and did not see clear evidence of coronary calcification although there may be some motion artifact in the proximal right coronary artery.  That being said it might be helpful to get a dedicated calcium score to further risk stratify her.  Her most recent lipid profile was abnormal indicating total cholesterol of 262, HDL 60, triglycerides 150 and LDL 175.  She reports being fairly sedentary but does try to reduce saturated fats and avoid fried foods in her diet.  In the past she said her cholesterol was "borderline".  The patient does not have symptoms concerning for COVID-19 infection (fever, chills, cough, or new SHORTNESS OF BREATH).    Prior CV studies:   The following studies were reviewed today:  , Lab work  PMHx:  Past Medical History:  Diagnosis Date  . Anxiety   . Arthritis   . Complication of anesthesia    some type of sedation medication made her feel crazy  . Dysrhythmia   . Family history of adverse reaction to anesthesia    daughter has Malignant Hyperthermia hx -   .  GERD (gastroesophageal reflux disease)   . Glaucoma   . H/O: hysterectomy 1978  . Heart disease   . History of hiatal hernia   . Hypertension   . MVP (mitral valve prolapse)   . Panic attacks   . Pre-diabetes   . Sciatica     Past Surgical History:  Procedure Laterality Date  . APPENDECTOMY  1972  . BIOPSY  07/06/2018   Procedure: BIOPSY;  Surgeon: Rush Landmark Telford Nab., MD;  Location: [facility];  Service: Gastroenterology;;  . CATARACT  EXTRACTION  2013  . [place]  . COLONOSCOPY    . ESOPHAGOGASTRODUODENOSCOPY (EGD) WITH PROPOFOL N/A 07/06/2018   Procedure: ESOPHAGOGASTRODUODENOSCOPY (EGD) WITH PROPOFOL;  Surgeon: Rush Landmark Telford Nab., MD;  Location: [facility];  Service: Gastroenterology;  Laterality: N/A;  . HAND SURGERY Bilateral   . HEMORRHOID SURGERY  2018  . S/P Hysterectomy   1987    FAMHx:  Family History  Problem Relation Age of Onset  . Pancreatic cancer Mother   . Alcohol abuse Father   . Asthma Sister   . Hypertension Sister   . Arthritis Sister   . Diabetes Brother   . Arthritis Brother   . Hyperlipidemia Brother   . Hypertension Brother   . Glaucoma Brother   . Colon polyps Brother   . Arthritis Sister   . Arthritis Sister   . Prostate cancer Brother   . Congenital heart disease Brother   . Colon cancer Neg Hx   . Esophageal cancer Neg Hx   . Inflammatory bowel disease Neg Hx   . Liver disease Neg Hx   . Rectal cancer Neg Hx   . Stomach cancer Neg Hx     SOCHx:   reports that she has never smoked. She has never used smokeless tobacco. She reports previous alcohol use. She reports previous drug use.  ALLERGIES:  Allergies  Allergen Reactions  . Atracurium & Derivatives Other (See Comments)    Unknown  . Atropine Other (See Comments)    Heart racing  . Iodine Other (See Comments)    Unknown  . Shellfish Allergy Other (See Comments)    Unknown  . Statins     "didn't feel right"     MEDS:  Current Meds  Medication Sig  . ALPRAZolam (XANAX) 0.5 MG tablet Take 0.5 tablets (0.25 mg total) by mouth daily as needed for anxiety.  Marland Kitchen aspirin 81 MG chewable tablet Chew 81 mg by mouth daily.  Marland Kitchen BIOTIN PO Take 1 tablet by mouth daily.   . Cholecalciferol (VITAMIN D3) 25 MCG (1000 UT) CAPS Take 1,000 Units by mouth daily.  Marland Kitchen DEXILANT 60 MG capsule TAKE 1 CAPSULE BY MOUTH  DAILY  . diltiazem (CARDIZEM) 30 MG tablet TAKE 1 TABLET BY MOUTH  TWICE DAILY  .  fluticasone (FLONASE) 50 MCG/ACT nasal spray Place 2 sprays into both nostrils daily.  . hydrochlorothiazide (HYDRODIURIL) 25 MG tablet TAKE 1 TABLET BY MOUTH  DAILY  . latanoprost (XALATAN) 0.005 % ophthalmic solution Place 1 drop into both eyes at bedtime.   . Multiple Vitamins-Minerals (CENTRUM SILVER 50+WOMEN PO) Take 1 tablet by mouth daily.  . Omega-3 Fatty Acids (FISH OIL PO) Take 1 capsule by mouth daily.  . polycarbophil (FIBERCON) 625 MG tablet Take 625 mg by mouth daily.  . polyethylene glycol (MIRALAX / GLYCOLAX) packet Take 17 g by mouth daily.     ROS: Pertinent items noted in HPI and remainder of comprehensive ROS otherwise  negative.  Labs/Other Tests and Data Reviewed:    Recent Labs: 08/18/2020: ALT 16; BUN 20; Creatinine, Ser 0.79; Hemoglobin 12.6; Platelets 271; Potassium 3.4; Sodium 139   Recent Lipid Panel Lab Results  Component Value Date/Time   CHOL 262 (H) 08/18/2020 12:38 PM   TRIG 150 (H) 08/18/2020 12:38 PM   HDL 60 08/18/2020 12:38 PM   CHOLHDL 4.4 08/18/2020 12:38 PM   CHOLHDL 5 05/27/2018 10:45 AM   LDLCALC 175 (H) 08/18/2020 12:38 PM    Wt Readings from Last 3 Encounters:  09/20/20 144 lb (65.3 kg)  08/18/20 143 lb (64.9 kg)  05/24/20 149 lb 12.8 oz (67.9 kg)     Exam:    Vital Signs:  BP 113/71   Pulse 77   Ht 5\' 3"  (1.6 m)   Wt 144 lb (65.3 kg)   LMP  (LMP Unknown)   BMI 25.51 kg/m    General appearance: alert and no distress Lungs: No coughing Abdomen: Normal weight Extremities: extremities normal, atraumatic, no cyanosis or edema Skin: Skin color, texture, turgor normal. No rashes or lesions Neurologic: Grossly normal Psych: Pleasant  ASSESSMENT & PLAN:    1. Mixed dyslipidemia 2. Statin intolerance-"did not feel right, jittery", no clear myalgias 3. Atypical chest pain  Ms. Gilberto has a mixed dyslipidemia with an elevated LDL cholesterol.  A lot of this may be dietary as she does not have a strong history of elevated  cholesterol.  She was recommended to be on statins in the past which I would agree with however she had side effects, but is not clear that they were totally related to the statin.  We may need to retrial a statin per possibly at a lower dose but I would like to get information about what she had previously tried from her cardiologist.  I had also like to get a calcium score which is dedicated to further restratify her.  It was not clear that there was any significant coronary calcium on her high-resolution chest CT although there may have been some possible calcium or motion artifact in the proximal right coronary.  I will contact her with the results of her calcium score we could further recommend options at that time when I have more information about what she had previously trialed.  Thanks for the kind referral.  COVID-19 Education: The signs and symptoms of COVID-19 were discussed with the patient and how to seek care for testing (follow up with PCP or arrange E-visit).  The importance of social distancing was discussed today.  Patient Risk:   After full review of this patients clinical status, I feel that they are at least moderate risk at this time.  Time:   Today, I have spent 25 minutes with the patient with telehealth technology discussing dyslipidemia, statin intolerance, prior imaging findings, diet and lifestyle modification.     Medication Adjustments/Labs and Tests Ordered: Current medicines are reviewed at length with the patient today.  Concerns regarding medicines are outlined above.   Tests Ordered: Orders Placed This Encounter  Procedures  . CT CARDIAC SCORING (SELF PAY ONLY)    Medication Changes: No orders of the defined types were placed in this encounter.   Disposition:  in 3 month(s)  [name], MD, [facility], [place] Director of the Advanced Lipid Disorders &  Cardiovascular Risk Reduction Clinic Diplomate of the  American Board of Clinical Lipidology Attending Cardiologist  Direct Dial: [phone]  Fax: [phone]  Website:  [email or web address]  [name], MD  09/20/2020 9:25 AM

## 2020-09-20 NOTE — Progress Notes (Signed)
09/20/20-Called and spoke with the patient to set a CCM Call appointment with [name], CPP. The patient voiced she is upset she was not contacted from the recent scheduled CCM Call visit 08/31/20 at 10 AM. Patient stated she recall receiving an appointment reminder call the day before appointment. I apologized to the patient for the inconvenience and asked if she would like to set a new CCM Call follow up with the CPP, patient agreed but verbalized she is unable to schedule for this month she is going on vacation. Patient asked to be scheduled for a morning appointment in May. I asked the patient if Oct 17, 2020 at 66 AM works for her, patient agreed and confirmed.  Patient scheduled for a CCM Call follow up appointment Oct 17, 2020 at [place], CPP Notified.  Raynelle Highland, [place] Pharmacist Assistant [phone] [name]: 15 Minutes

## 2020-10-02 ENCOUNTER — Telehealth: Payer: Self-pay | Admitting: Internal Medicine

## 2020-10-02 NOTE — Telephone Encounter (Signed)
Spoke with patient regarding 11/06/20 10:45 am scheduled Calcium Scoring--arrival time is 10:30 am--[place] [facility] [address], [address]---will mail information to patient and it is also available in My Chart.  Patient to call with questions or concerns.

## 2020-10-04 ENCOUNTER — Telehealth: Payer: Self-pay | Admitting: *Deleted

## 2020-10-04 NOTE — Chronic Care Management (AMB) (Signed)
  Care Management   Note  10/04/2020 Name: Terri Barajas MRN: [id number] DOB: 02-25-1946  Terri Barajas is a 75 y.o. year old female who is a primary care patient of [name], [place] and is actively engaged with the care management team. I reached out to [place] by phone today to assist with re-scheduling a follow up visit with the Pharmacist  Follow up plan: A telephone outreach attempt made. The care management team will reach out to the patient again over the next 7 days. If patient returns call to provider office, please advise to call [place] at [phone].  [place] Management

## 2020-10-05 ENCOUNTER — Telehealth: Payer: Medicare Other

## 2020-10-05 ENCOUNTER — Ambulatory Visit (INDEPENDENT_AMBULATORY_CARE_PROVIDER_SITE_OTHER): Payer: Medicare Other

## 2020-10-05 DIAGNOSIS — M5432 Sciatica, left side: Secondary | ICD-10-CM

## 2020-10-05 DIAGNOSIS — I700 Atherosclerosis of aorta: Secondary | ICD-10-CM

## 2020-10-05 DIAGNOSIS — R937 Abnormal findings on diagnostic imaging of other parts of musculoskeletal system: Secondary | ICD-10-CM

## 2020-10-05 DIAGNOSIS — E782 Mixed hyperlipidemia: Secondary | ICD-10-CM | POA: Diagnosis not present

## 2020-10-05 DIAGNOSIS — K219 Gastro-esophageal reflux disease without esophagitis: Secondary | ICD-10-CM

## 2020-10-05 DIAGNOSIS — I10 Essential (primary) hypertension: Secondary | ICD-10-CM

## 2020-10-09 NOTE — Chronic Care Management (AMB) (Signed)
  Care Management   Note  10/09/2020 Name: DERIONNA SALVADOR MRN: [id number] DOB: 1946/01/08  Avelina Laine Bekele is a 75 y.o. year old female who is a primary care patient of [name], [place] and is actively engaged with the care management team. I reached out to [place] by phone today to assist with scheduling a follow up visit with the Pharmacist  Follow up plan: Telephone appointment with care management team member scheduled for: 10/26/2020  [place] Management

## 2020-10-10 DIAGNOSIS — M7052 Other bursitis of knee, left knee: Secondary | ICD-10-CM | POA: Insufficient documentation

## 2020-10-10 DIAGNOSIS — M7062 Trochanteric bursitis, left hip: Secondary | ICD-10-CM | POA: Diagnosis not present

## 2020-10-10 DIAGNOSIS — M1712 Unilateral primary osteoarthritis, left knee: Secondary | ICD-10-CM | POA: Diagnosis not present

## 2020-10-12 DIAGNOSIS — E119 Type 2 diabetes mellitus without complications: Secondary | ICD-10-CM | POA: Diagnosis not present

## 2020-10-13 DIAGNOSIS — M7052 Other bursitis of knee, left knee: Secondary | ICD-10-CM | POA: Diagnosis not present

## 2020-10-13 DIAGNOSIS — M1712 Unilateral primary osteoarthritis, left knee: Secondary | ICD-10-CM | POA: Diagnosis not present

## 2020-10-15 NOTE — Chronic Care Management (AMB) (Signed)
Chronic Care Management   CCM RN Visit Note  10/05/2020 Name: Terri Barajas MRN: [id number] DOB: 1945-06-25  Subjective: Terri Barajas is a 75 y.o. year old female who is a primary care patient of [name], [place]. The care management team was consulted for assistance with disease management and care coordination needs.    Engaged with patient by telephone for follow up visit in response to provider referral for case management and/or care coordination services.   Consent to Services:  The patient was given information about Chronic Care Management services, agreed to services, and gave verbal consent prior to initiation of services.  Please see initial visit note for detailed documentation.   Patient agreed to services and verbal consent obtained.   Assessment: Review of patient past medical history, allergies, medications, health status, including review of consultants reports, laboratory and other test data, was performed as part of comprehensive evaluation and provision of chronic care management services.   SDOH (Social Determinants of Health) assessments and interventions performed:  Yes, no acute needs identified  CCM Care Plan  Allergies  Allergen Reactions  . Atracurium & Derivatives Other (See Comments)    Unknown  . Atropine Other (See Comments)    Heart racing  . Iodine Other (See Comments)    Unknown  . Shellfish Allergy Other (See Comments)    Unknown  . Statins     "didn't feel right"     Outpatient Encounter Medications as of 10/05/2020  Medication Sig  . ALPRAZolam (XANAX) 0.5 MG tablet Take 0.5 tablets (0.25 mg total) by mouth daily as needed for anxiety.  Marland Kitchen aspirin 81 MG chewable tablet Chew 81 mg by mouth daily.  Marland Kitchen BIOTIN PO Take 1 tablet by mouth daily.   . Cholecalciferol (VITAMIN D3) 25 MCG (1000 UT) CAPS Take 1,000 Units by mouth daily.  Marland Kitchen DEXILANT 60 MG capsule TAKE 1 CAPSULE BY MOUTH  DAILY  . diltiazem (CARDIZEM) 30 MG tablet TAKE 1  TABLET BY MOUTH  TWICE DAILY  . dorzolamide-timolol (COSOPT) 22.3-6.8 MG/ML ophthalmic solution Apply 1 drop to eye in the morning and at bedtime.  . fluticasone (FLONASE) 50 MCG/ACT nasal spray Place 2 sprays into both nostrils daily.  Marland Kitchen gabapentin (NEURONTIN) 100 MG capsule Take 1 capsule (100 mg total) by mouth 3 (three) times daily. (Patient not taking: No sig reported)  . hydrochlorothiazide (HYDRODIURIL) 25 MG tablet TAKE 1 TABLET BY MOUTH  DAILY  . ibuprofen (ADVIL) 800 MG tablet Take 1 tablet (800 mg total) by mouth 3 (three) times daily. (Patient not taking: Reported on 09/20/2020)  . latanoprost (XALATAN) 0.005 % ophthalmic solution Place 1 drop into both eyes at bedtime.   . Multiple Vitamins-Minerals (CENTRUM SILVER 50+WOMEN PO) Take 1 tablet by mouth daily.  . Omega-3 Fatty Acids (FISH OIL PO) Take 1 capsule by mouth daily.  . polycarbophil (FIBERCON) 625 MG tablet Take 625 mg by mouth daily.  . polyethylene glycol (MIRALAX / GLYCOLAX) packet Take 17 g by mouth daily.  . RHOPRESSA 0.02 % SOLN Apply 1 drop to eye daily.   No facility-administered encounter medications on file as of 10/05/2020.    Patient Active Problem List   Diagnosis Date Noted  . OSA on CPAP 01/06/2019  . Abnormal CT of the abdomen 10/04/2018  . Radiculopathy affecting upper extremity, left 07/25/2018  . Hyperlipidemia 05/13/2018  . Situational anxiety 04/29/2018  . MVP (mitral valve prolapse) 04/29/2018  . Sleep-disordered breathing 04/29/2018  . Family History of Hyperthermia,  malignant 04/23/2018  . Barrett's esophagus 03/01/2018  . Elevated lipase 02/18/2018  . Hiatal hernia 02/18/2018  . Constipation 02/18/2018  . Bloating 02/18/2018  . Aortic atherosclerosis ([place]) 01/23/2018  . Gastroesophageal reflux disease without esophagitis   . Glaucoma   . Hypertension     Conditions to be addressed/monitored:Review printed mailed ed mats related to how to lower High Cholesterol  Care Plan : Assist with  Chronic Care Management and Care Coordination Needs  Updates made by Lynne Logan, RN since 10/15/2020 12:00 AM  Completed 10/15/2020  Care Plan : Hypertension (Adult)  Updates made by Lynne Logan, RN since 10/15/2020 12:00 AM    Problem: Disease Progression (Hypertension)   Priority: Medium    Long-Range Goal: Disease Progression Prevented or Minimized   Start Date: 07/05/2020  Expected End Date: 01/04/2021  Recent Progress: On track  Priority: Medium  Note:   Objective:  . Last practice recorded BP readings:  BP Readings from Last 3 Encounters:  05/24/20 120/84  05/20/20 112/84  05/18/20 138/80 .   Marland Kitchen Most recent eGFR/CrCl: No results found for: EGFR  No components found for: CRCL Current Barriers:  Marland Kitchen Knowledge Deficits related to basic understanding of hypertension pathophysiology and self care management . Knowledge Deficits related to understanding of medications prescribed for management of hypertension Case Manager Clinical Goal(s):  Marland Kitchen Over the next 180 days, patient will demonstrate improved health management independence as evidenced by checking blood pressure as directed and notifying PCP if SBP>130 or DBP > 80, taking all medications as prescribe, and adhering to a low sodium diet as discussed. Interventions:  . Collaboration with [name], FNP regarding development and update of comprehensive plan of care as evidenced by provider attestation and co-signature . Inter-disciplinary care team collaboration (see longitudinal plan of care) . 10/05/20 completed successful outbound call with patient  . Evaluation of current treatment plan related to hypertension self management and patient's adherence to plan as established by provider . Determined patient has a home BP cuff and feels comfortable with using the BP cuff, she admits she does not remember to check her BP  . Educated on dietary and exercise recommendations, Educated with rationale on importance of adhering to low  Sodium diet  . Reviewed medications with patient and discussed importance of compliance . Advised patient, providing education and rationale, to monitor blood pressure daily and record, calling PCP for findings outside established parameters.  . Confirmed patient received and reviewed printed BP Log, What is High Blood Pressure?; Why Should I Lower Sodium?  . Discussed plans with patient for ongoing care management follow up and provided patient with direct contact information for care management team Patient Goals/Self-Care Activities write blood pressure results in a log or diary  utilize mailed BP log to record BP readings to review with RN CM at next call  review printed educational materials related to BP management  Self administers medications as prescribed Attends all scheduled provider appointments Calls provider office for new concerns, questions, or BP outside discussed parameters Checks BP and records as discussed Follows a low sodium diet/DASH diet  Follow Up Plan: Telephone follow up appointment with care management team member scheduled for:  01/04/21   Care Plan : Nonspecific Low Back Pain (Adult)  Updates made by Lynne Logan, RN since 10/15/2020 12:00 AM    Problem: Acute Low Back Pain   Priority: High    Goal: Acute Low Back Pain Managed Completed 10/05/2020  Start Date: 07/05/2020  Expected End Date: 10/03/2020  Recent Progress: On track  Priority: High  Note:   Current Barriers:  Marland Kitchen Knowledge Deficits related to self-health management of acute or chronic pain . Chronic Disease Management support and education needs related to chronic pain Clinical Goal(s):  Marland Kitchen Over the next 30 days, patient will verbalize understanding of plan for impinged nerve to lumber spine. Interventions:  . Collaboration with [name], FNP regarding development and update of comprehensive plan of care as evidenced by provider attestation and co-signature . Evaluation of current treatment  plan related to impinged nerve pain and patient's adherence to plan as established by provider . pain assessed . Determined patient is currently no longer having acute sciatica nerve pain . Determined patient is stretching daily and has resumed her normal daily routines  . Discussed plans with patient for ongoing care management follow up and provided patient with direct contact information for care management team Patient Goals/Self Care Activities:  -Follow up with Neurosurgeon as recommended for evaluation and treatment of impinged nerve to lumbar spine -Seek medical attention promptly if symptoms persist or worsen   Follow Up Plan: Telephone follow up appointment with care management team member scheduled for: 08/16/20      Care Plan : Mixed Hyperlipidemia  Updates made by Lynne Logan, RN since 10/15/2020 12:00 AM    Problem: Mixed Hyperlipidemia   Priority: High    Goal: Mixed Hyperlipidemia - complications minimized or prevented   Start Date: 10/05/2020  Expected End Date: 04/06/2021  This Visit's Progress: On track  Priority: High  Note:   Current Barriers:   Ineffective Self Health Maintenance  Clinical Goal(s):  Marland Kitchen Collaboration with [name], FNP regarding development and update of comprehensive plan of care as evidenced by provider attestation and co-signature . Inter-disciplinary care team collaboration (see longitudinal plan of care)  patient will work with care management team to address care coordination and chronic disease management needs related to Disease Management  Educational Needs  Care Coordination  Medication Management and Education  Psychosocial Support   Interventions:   Evaluation of current treatment plan related to HLD, self-management and patient's adherence to plan as established by provider.  Collaboration with [name], FNP regarding development and update of comprehensive plan of care as evidenced by provider attestation        and co-signature  Inter-disciplinary care team collaboration (see longitudinal plan of care) . Provided education to patient about basic disease process related to Mixed Hyperlipidemia  . Review of patient status, including review of consultants reports, relevant laboratory and other test results, and medications completed. . Educated patient on dietary and exercise recommendations . Reviewed medications with patient and discussed importance of medication adherence . Mailed printed educational materials related to Mixed Hyperlipidemia   Discussed plans with patient for ongoing care management follow up and provided patient with direct contact information for care management team Self Care Activities:  . Continue to keep all scheduled follow up appointments . Take medications as directed  . Let your healthcare team know if you are unable to take your medications . Call your pharmacy for refills at least 7 days prior to running out of medication Patient Goals: - to lower Cholesterol levels   Follow Up Plan: Telephone follow up appointment with care management team member scheduled for: 01/04/21     Plan:Telephone follow up appointment with care management team member scheduled for:  01/04/21  Barb Merino, RN, BSN, CCM Care Management Coordinator [facility]  Direct Phone: [phone]

## 2020-10-15 NOTE — Patient Instructions (Signed)
Goals Addressed    . 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 Mixed Hyperlipidemia - complications minimized or prevented   On track    Timeframe:  Long-Range Goal Priority:  High Start Date: 10/05/20                            Expected End Date: 04/06/21  Next Follow up date: 7/2/122  Self Care Activities:  . Continue to keep all scheduled follow up appointments . Take medications as directed  . Let your healthcare team know if you are unable to take your medications . Call your pharmacy for refills at least 7 days prior to running out of medication . Review printed mailed ed mats related to how to lower High Cholesterol Patient Goals: - to lower Cholesterol levels                          . Track and Manage My Blood Pressure-Hypertension   On track    Timeframe:  Long-Range Goal Priority:  Medium Start Date: 07/05/20                           Expected End Date: 01/04/21                    Follow Up Date: 01/04/21   - write blood pressure results in a log or diary  -utilize mailed BP log to record BP readings to review with RN CM at next call  -review printed educational materials related to BP management  -Self administers medications as prescribed -Attends all scheduled provider appointments -Calls provider office for new concerns, questions, or BP outside discussed parameters -Checks BP and records as discussed -Follows a low sodium diet/DASH diet   Why is this important?    You won't feel high blood pressure, but it can still hurt your blood  vessels.   High blood pressure can cause heart or kidney problems. It can also cause a stroke.   Making lifestyle changes like losing a [place] weight or eating less salt will help.   Checking your blood pressure at home and at different times of the day can help to control blood pressure.   If the doctor prescribes medicine remember to take it the way the doctor ordered.   Call the office if you cannot afford the medicine or if there are questions about it.     Notes:     . COMPLETED: Work with embedded Pharm D for cost assistance of Dexilant       Timeframe:  Short-Term Goal Priority:  Medium Start Date: 07/05/20                            Expected  End Date:  08/18/20   Follow Up Date: 08/16/20  Over the next 30 days, patient will:  -work with embedded Pharm D for assistance with medication cost of Dexilant -continue to take medications exactly as prescribed -continue to call pharmacy for refills when supply is getting low and prior to running out of medication

## 2020-10-16 DIAGNOSIS — M25562 Pain in left knee: Secondary | ICD-10-CM | POA: Diagnosis not present

## 2020-10-16 DIAGNOSIS — G4733 Obstructive sleep apnea (adult) (pediatric): Secondary | ICD-10-CM | POA: Diagnosis not present

## 2020-10-16 DIAGNOSIS — M5126 Other intervertebral disc displacement, lumbar region: Secondary | ICD-10-CM | POA: Diagnosis not present

## 2020-10-16 DIAGNOSIS — R030 Elevated blood-pressure reading, without diagnosis of hypertension: Secondary | ICD-10-CM | POA: Diagnosis not present

## 2020-10-19 DIAGNOSIS — M25562 Pain in left knee: Secondary | ICD-10-CM | POA: Diagnosis not present

## 2020-10-23 DIAGNOSIS — M1712 Unilateral primary osteoarthritis, left knee: Secondary | ICD-10-CM | POA: Diagnosis not present

## 2020-10-25 ENCOUNTER — Telehealth: Payer: Self-pay

## 2020-10-25 DIAGNOSIS — M7052 Other bursitis of knee, left knee: Secondary | ICD-10-CM | POA: Diagnosis not present

## 2020-10-25 NOTE — Chronic Care Management (AMB) (Signed)
Called patient for an appointment reminder with [name], [place] on 10-26-2020 at 9:30. Instructed patient to have all meds/supplements and logs available for review. Patient voiced understanding.   Opp  [phone]

## 2020-10-26 ENCOUNTER — Telehealth: Payer: Medicare Other

## 2020-10-30 DIAGNOSIS — M7052 Other bursitis of knee, left knee: Secondary | ICD-10-CM | POA: Diagnosis not present

## 2020-11-02 DIAGNOSIS — M25562 Pain in left knee: Secondary | ICD-10-CM | POA: Diagnosis not present

## 2020-11-06 ENCOUNTER — Other Ambulatory Visit: Payer: Self-pay

## 2020-11-06 ENCOUNTER — Ambulatory Visit (INDEPENDENT_AMBULATORY_CARE_PROVIDER_SITE_OTHER)
Admission: RE | Admit: 2020-11-06 | Discharge: 2020-11-06 | Disposition: A | Discharge status: Home / Self Care | Payer: Self-pay | Source: Ambulatory Visit | Attending: Internal Medicine | Admitting: Internal Medicine

## 2020-11-06 DIAGNOSIS — E782 Mixed hyperlipidemia: Secondary | ICD-10-CM

## 2020-11-07 ENCOUNTER — Telehealth: Payer: Self-pay

## 2020-11-07 ENCOUNTER — Telehealth: Payer: Self-pay | Admitting: Internal Medicine

## 2020-11-07 NOTE — Chronic Care Management (AMB) (Signed)
Chronic Care Management Pharmacy Assistant   Name: Terri Barajas  MRN: [id number] DOB: 02/21/1946   Reason for Encounter: Disease State/ Hypertension   Recent office visits:  08-18-2020 [name], NP. STOP Tylenol #3 300-15 MG one tablet every 4 hours as needed (Patient reported not taking). STOP Tessalon 200 MG twice daily as needed for cough (Patient reported not taking). STOP Benadryl 25 MG as needed (Patient reported not taking). STOP EPINEPHrine 0.3 mg injection as needed (Patient reported not taking). STOP Tizanidine 2 MG one tablet every 8 hours as needed. STOP Prednisone 20 MG 2 tablets daily for 3 days (complete).  09-04-2020 Terri Barajas (CCM)  10-05-2020 [name] (CCM)  Recent consult visits:   09-20-2020 Terri Casino, MD (Cardiology). STOP Alphagan 0.2% 1 drop in both eyes twice daily.   Hospital visits:  None in previous 6 months  Medications: Outpatient Encounter Medications as of 11/07/2020  Medication Sig  . ALPRAZolam (XANAX) 0.5 MG tablet Take 0.5 tablets (0.25 mg total) by mouth daily as needed for anxiety.  Marland Kitchen aspirin 81 MG chewable tablet Chew 81 mg by mouth daily.  Marland Kitchen BIOTIN PO Take 1 tablet by mouth daily.   . Cholecalciferol (VITAMIN D3) 25 MCG (1000 UT) CAPS Take 1,000 Units by mouth daily.  Marland Kitchen DEXILANT 60 MG capsule TAKE 1 CAPSULE BY MOUTH  DAILY  . diltiazem (CARDIZEM) 30 MG tablet TAKE 1 TABLET BY MOUTH  TWICE DAILY  . dorzolamide-timolol (COSOPT) 22.3-6.8 MG/ML ophthalmic solution Apply 1 drop to eye in the morning and at bedtime.  . fluticasone (FLONASE) 50 MCG/ACT nasal spray Place 2 sprays into both nostrils daily.  Marland Kitchen gabapentin (NEURONTIN) 100 MG capsule Take 1 capsule (100 mg total) by mouth 3 (three) times daily. (Patient not taking: No sig reported)  . hydrochlorothiazide (HYDRODIURIL) 25 MG tablet TAKE 1 TABLET BY MOUTH  DAILY  . ibuprofen (ADVIL) 800 MG tablet Take 1 tablet (800 mg total) by mouth 3 (three) times  daily. (Patient not taking: Reported on 09/20/2020)  . latanoprost (XALATAN) 0.005 % ophthalmic solution Place 1 drop into both eyes at bedtime.   . Multiple Vitamins-Minerals (CENTRUM SILVER 50+WOMEN PO) Take 1 tablet by mouth daily.  . Omega-3 Fatty Acids (FISH OIL PO) Take 1 capsule by mouth daily.  . polycarbophil (FIBERCON) 625 MG tablet Take 625 mg by mouth daily.  . polyethylene glycol (MIRALAX / GLYCOLAX) packet Take 17 g by mouth daily.  . RHOPRESSA 0.02 % SOLN Apply 1 drop to eye daily.   No facility-administered encounter medications on file as of 11/07/2020.   Reviewed chart prior to disease state call. Spoke with patient regarding BP  Recent Office Vitals: BP Readings from Last 3 Encounters:  09/20/20 113/71  08/18/20 128/68  05/24/20 120/84   Pulse Readings from Last 3 Encounters:  09/20/20 77  08/18/20 90  05/24/20 95    Wt Readings from Last 3 Encounters:  09/20/20 144 lb (65.3 kg)  08/18/20 143 lb (64.9 kg)  05/24/20 149 lb 12.8 oz (67.9 kg)     Kidney Function Lab Results  Component Value Date/Time   CREATININE 0.79 08/18/2020 12:38 PM   CREATININE 0.82 03/01/2020 11:06 AM   GFR 90.57 08/03/2019 01:40 PM   GFRNONAA 72 03/01/2020 11:06 AM   GFRAA 83 03/01/2020 11:06 AM    BMP Latest Ref Rng & Units 08/18/2020 03/01/2020 02/07/2020  Glucose 65 - 99 mg/dL 110(H) 106(H) 127(H)  BUN 8 - 27 mg/dL 20  21 21  Creatinine 0.57 - 1.00 mg/dL 0.79 0.82 0.73  BUN/Creat Ratio 12 - 28 25 26  29(H)  Sodium 134 - 144 mmol/L 139 140 138  Potassium 3.5 - 5.2 mmol/L 3.4(L) 4.0 3.3(L)  Chloride 96 - 106 mmol/L 98 100 95(L)  CO2 20 - 29 mmol/L 28 30(H) 27  Calcium 8.7 - 10.3 mg/dL 9.7 10.0 10.0    . Current antihypertensive regimen:   Diltiazem 30 mg tablet twice per day   Hydrochlorothiazide 25 mg tablet  daily   . How often are you checking your Blood Pressure? several times per month. Patient states she was checking blood pressure daily but hasn't lately.  . Current  home BP readings: 104/65  . What recent interventions/DTPs have been made by any provider to improve Blood Pressure control since last CPP Visit: Patient states she is taking medication as directed.  . Any recent hospitalizations or ED visits since last visit with CPP? No   . What diet changes have been made to improve Blood Pressure Control?  o Patient states she rarely eats anything with salt and has cut out sweets. . What exercise is being done to improve your Blood Pressure Control?  o Patient states she hasn't been able to exercise due to pain in her left leg. Patient states she is receiving some therapy  Adherence Review: Is the patient currently on ACE/ARB medication? No Does the patient have >5 day gap between last estimated fill dates? No  NOTES: Sent scheduling a message to schedule patient for appointment with [name] [facility] on  12-28-20 at 12:15.  Star Rating Drugs: None  [place] Clinical Pharmacist Assistant [phone]

## 2020-11-07 NOTE — Telephone Encounter (Signed)
Pt is calling in regards to the results from her CT on 11/06/20

## 2020-11-07 NOTE — Telephone Encounter (Signed)
Spoke with pt, aware waiting for provider review and we will call once dr hilty has reviewed.

## 2020-11-10 ENCOUNTER — Telehealth: Payer: Self-pay | Admitting: Cardiovascular Disease

## 2020-11-10 NOTE — Telephone Encounter (Signed)
This RN called patient back, patient saw the following results from Dr. Debara Pickett and Eliezer Lofts, RN, which this RN reviewed again with patient:   Your calcium score is zero - great news. Work on diet and more activity for lipid-lowering measures. No med changes.   There are some post-inflammatory lung changes, possibly related to prior COVID19 illness.   Please reach out via MyChart or call [phone] if you have any questions/concerns.   Thank you,  Eliezer Lofts, RN  Written by Fidel Levy, RN on 11/10/2020 10:00 AM EDT  [name], MD  11/06/2020 4:06 PM EDT      I would have her work aggressively on diet and more activity at this point.  Dr Randall An, MD  11/06/2020 11:51 AM EDT      Zero CAC score - post-inflammatory lung changes possibly related to prior COVID 19 infection.  Dr H   Patient had concerns about what post-inflammatory changes in her lungs means and whether she should be doing anything differently to address this. She reports she would like more information about this part of her result. This RN let patient know her concerns would be forwarded to Dr. Debara Pickett. Patient verbalized understanding.

## 2020-11-10 NOTE — Telephone Encounter (Signed)
Patient was calling to talk with Dr. Oval Linsey or nurse regarding inflammation they found in her lungs....please call back

## 2020-11-21 DIAGNOSIS — H04123 Dry eye syndrome of bilateral lacrimal glands: Secondary | ICD-10-CM | POA: Diagnosis not present

## 2020-11-24 DIAGNOSIS — H16141 Punctate keratitis, right eye: Secondary | ICD-10-CM | POA: Diagnosis not present

## 2020-12-08 ENCOUNTER — Telehealth: Payer: Self-pay

## 2020-12-08 NOTE — Chronic Care Management (AMB) (Signed)
    Chronic Care Management Pharmacy Assistant   Name: LILLYANNE BRADBURN  MRN: [id number] DOB: 1945-12-31    Reason for Encounter: Disease State/ Hypertension  Recent office visits:  None  Recent consult visits:  None  Hospital visits:  None in previous 6 months  Medications: Outpatient Encounter Medications as of 12/08/2020  Medication Sig   ALPRAZolam (XANAX) 0.5 MG tablet Take 0.5 tablets (0.25 mg total) by mouth daily as needed for anxiety.   aspirin 81 MG chewable tablet Chew 81 mg by mouth daily.   BIOTIN PO Take 1 tablet by mouth daily.    Cholecalciferol (VITAMIN D3) 25 MCG (1000 UT) CAPS Take 1,000 Units by mouth daily.   DEXILANT 60 MG capsule TAKE 1 CAPSULE BY MOUTH  DAILY   diltiazem (CARDIZEM) 30 MG tablet TAKE 1 TABLET BY MOUTH  TWICE DAILY   dorzolamide-timolol (COSOPT) 22.3-6.8 MG/ML ophthalmic solution Apply 1 drop to eye in the morning and at bedtime.   fluticasone (FLONASE) 50 MCG/ACT nasal spray Place 2 sprays into both nostrils daily.   gabapentin (NEURONTIN) 100 MG capsule Take 1 capsule (100 mg total) by mouth 3 (three) times daily. (Patient not taking: No sig reported)   hydrochlorothiazide (HYDRODIURIL) 25 MG tablet TAKE 1 TABLET BY MOUTH  DAILY   ibuprofen (ADVIL) 800 MG tablet Take 1 tablet (800 mg total) by mouth 3 (three) times daily. (Patient not taking: Reported on 09/20/2020)   latanoprost (XALATAN) 0.005 % ophthalmic solution Place 1 drop into both eyes at bedtime.    Multiple Vitamins-Minerals (CENTRUM SILVER 50+WOMEN PO) Take 1 tablet by mouth daily.   Omega-3 Fatty Acids (FISH OIL PO) Take 1 capsule by mouth daily.   polycarbophil (FIBERCON) 625 MG tablet Take 625 mg by mouth daily.   polyethylene glycol (MIRALAX / GLYCOLAX) packet Take 17 g by mouth daily.   RHOPRESSA 0.02 % SOLN Apply 1 drop to eye daily.   No facility-administered encounter medications on file as of 12/08/2020.     12-08-2020: 1st attempt. Patient states she is on vacation  and would like a call back in mid July.   Star Rating Drugs: None  [place] Clinical Pharmacist Assistant [phone]

## 2020-12-08 NOTE — Chronic Care Management (AMB) (Signed)
error 

## 2020-12-27 ENCOUNTER — Telehealth: Payer: Self-pay

## 2020-12-27 NOTE — Chronic Care Management (AMB) (Signed)
  Patient aware of telephone appointment with Terri Barajas on 12-28-2020 at 12:15. Patient aware to have/bring all medications, supplements, blood pressure and/or blood sugar logs to visit.  Questions:  Have you had any recent office visit or specialist visit outside of [place]? Patient stated no  Are there any concerns you would like to discuss during your office visit? Patient stated no  Are you having any problems obtaining your medications? (Whether it pharmacy issues or cost) Patient stated No   Care Gaps: Tdap overdue Shingrix overdue Covid booster overdue RAF= 1.31%  Star Rating Drug: None   Any gaps in medications fill history? no  [place] [employer] manager [phone]

## 2020-12-28 ENCOUNTER — Ambulatory Visit (INDEPENDENT_AMBULATORY_CARE_PROVIDER_SITE_OTHER): Payer: Medicare Other

## 2020-12-28 DIAGNOSIS — E782 Mixed hyperlipidemia: Secondary | ICD-10-CM

## 2020-12-28 DIAGNOSIS — K219 Gastro-esophageal reflux disease without esophagitis: Secondary | ICD-10-CM

## 2020-12-28 DIAGNOSIS — I700 Atherosclerosis of aorta: Secondary | ICD-10-CM

## 2020-12-28 NOTE — Progress Notes (Signed)
Chronic Care Management Pharmacy Note  01/03/2021 Name:  Terri Barajas MRN:  [id number] DOB:  Jul 29, 1945  Summary: Patient reports receiving COVID - 19 booster shot.   Recommendations/Changes made from today's visit: Recommend patient follow up to be vaccinated.  Recommend patient start taking Atorvastatin 10 mg tablet Monday through Friday.  Recommend patient start checking BP at least three times per day.   Plan: Pharmacist to research if patient received TDAP and shingrix vaccine.  Collaborate with PCP team to start patient on Atorvastatin 10 mg tablet on Monday - Friday   Subjective: Terri Barajas is an 75 y.o. year old female who is a primary patient of [name], [place].  The CCM team was consulted for assistance with disease management and care coordination needs.    Engaged with patient by telephone for follow up visit in response to provider referral for pharmacy case management and/or care coordination services. Patient reports that she has been on vacation and she was walking on but she has not started back. She has visitors in town and she has been very busy. She went to [place] and she was able to spend time with her daughter and grand kids. Yesterday she received her 4th booster shot for COVID-19, today she feels much better. She reports that she had fruit loops and banana for breakfast.  Patient reports that she is ready to get back on track.   Consent to Services:  The patient was given information about Chronic Care Management services, agreed to services, and gave verbal consent prior to initiation of services.  Please see initial visit note for detailed documentation.   Patient Care Team: [name], FNP as PCP - General ([place]) Skeet Latch, MD as PCP - Cardiology (Cardiology) Rossie Muskrat, MD as Referring Physician (Podiatry) Mayford Knife, [facility] (Pharmacist)  Recent office visits: 08/18/2020 PCP OV  Recent consult visits: 07/06/2020  Neurology [facility] visits: None in previous 6 months   Objective:  Lab Results  Component Value Date   CREATININE 0.79 08/18/2020   BUN 20 08/18/2020   GFR 90.57 08/03/2019   GFRNONAA 72 03/01/2020   GFRAA 83 03/01/2020   NA 139 08/18/2020   K 3.4 (L) 08/18/2020   CALCIUM 9.7 08/18/2020   CO2 28 08/18/2020   GLUCOSE 110 (H) 08/18/2020    Lab Results  Component Value Date/Time   HGBA1C 6.3 (H) 08/18/2020 12:38 PM   HGBA1C 6.0 05/27/2018 10:45 AM   GFR 90.57 08/03/2019 01:40 PM   GFR 66.53 05/27/2018 10:45 AM   MICROALBUR 30 08/18/2020 11:21 AM    Last diabetic Eye exam: No results found for: HMDIABEYEEXA  Last diabetic Foot exam: No results found for: HMDIABFOOTEX   Lab Results  Component Value Date   CHOL 262 (H) 08/18/2020   HDL 60 08/18/2020   LDLCALC 175 (H) 08/18/2020   TRIG 150 (H) 08/18/2020   CHOLHDL 4.4 08/18/2020    Hepatic Function Latest Ref Rng & Units 08/18/2020 08/03/2019 07/10/2018  Total Protein 6.0 - 8.5 g/dL 6.9 7.1 7.3  Albumin 3.7 - 4.7 g/dL 4.2 4.0 4.0  AST 0 - 40 IU/L _0 ALT 0 - 32 IU/L _1 Alk Phosphatase 44 - 121 IU/L 83 79 61  Total Bilirubin 0.0 - 1.2 mg/dL 0.5 0.5 0.6  Bilirubin, Direct 0.0 - 0.3 mg/dL - - -    Lab Results  Component Value Date/Time   TSH 1.22 08/03/2019 01:40 PM  TSH 1.21 05/27/2018 10:45 AM   FREET4 0.74 05/27/2018 10:45 AM    CBC Latest Ref Rng & Units 08/18/2020 02/15/2020 08/03/2019  WBC 3.4 - 10.8 x10E3/uL 6.7 5.7 6.7  Hemoglobin 11.1 - 15.9 g/dL 12.6 13.3 12.0  Hematocrit 34.0 - 46.6 % 38.4 39.2 36.1  Platelets 150 - 450 x10E3/uL 271 248.0 216.0    Lab Results  Component Value Date/Time   VD25OH 92.9 08/18/2020 12:38 PM    Clinical ASCVD: Yes  The 10-year ASCVD risk score Mikey Bussing DC Jr., et al., 2013) is: 22.8%   Values used to calculate the score:     Age: 49 years     Sex: Female     Is Non-Hispanic African American: Yes     Diabetic: No     Tobacco smoker: No     Systolic Blood  Pressure: 150 mmHg     Is BP treated: Yes     HDL Cholesterol: 60 mg/dL     Total Cholesterol: 262 mg/dL    Depression screen [facility] 2/9 05/18/2020 11/04/2019 07/12/2019  Decreased Interest 1 0 3  Down, Depressed, Hopeless 1 0 1  PHQ - 2 Score 2 0 4  Altered sleeping - - 0  Tired, decreased energy - - 3  Change in appetite - - 0  Feeling bad or failure about yourself  - - 0  Trouble concentrating - - 0  Moving slowly or fidgety/restless - - 0  Suicidal thoughts - - 0  PHQ-9 Score - - 7  Difficult doing work/chores - - -     Social History   Tobacco Use  Smoking Status Never  Smokeless Tobacco Never   BP Readings from Last 3 Encounters:  09/20/20 113/71  08/18/20 128/68  05/24/20 120/84   Pulse Readings from Last 3 Encounters:  09/20/20 77  08/18/20 90  05/24/20 95   Wt Readings from Last 3 Encounters:  09/20/20 144 lb (65.3 kg)  08/18/20 143 lb (64.9 kg)  05/24/20 149 lb 12.8 oz (67.9 kg)   BMI Readings from Last 3 Encounters:  09/20/20 25.51 kg/m  08/18/20 25.49 kg/m  05/24/20 26.71 kg/m    Assessment/Interventions: Review of patient past medical history, allergies, medications, health status, including review of consultants reports, laboratory and other test data, was performed as part of comprehensive evaluation and provision of chronic care management services.   SDOH:  (Social Determinants of Health) assessments and interventions performed: No  SDOH Screenings   Alcohol Screen: Not on file  Depression (PHQ2-9): Low Risk    PHQ-2 Score: 2  Financial Resource Strain: Low Risk    Difficulty of Paying Living Expenses: Not hard at all  Food Insecurity: No Food Insecurity   Worried About Charity fundraiser in the Last Year: Never true   Ran Out of Food in the Last Year: Never true  Housing: Low Risk    Last Housing Risk Score: 0  Physical Activity: Inactive   Days of Exercise per Week: 0 days   Minutes of Exercise per Session: 0 min  Social Connections:  Not on file  Stress: No Stress Concern Present   Feeling of Stress : Not at all  Tobacco Use: Low Risk    Smoking Tobacco Use: Never   Smokeless Tobacco Use: Never  Transportation Needs: No Transportation Needs   Lack of Transportation (Medical): No   Lack of Transportation (Non-Medical): No    CCM Care Plan  Allergies  Allergen Reactions   Atracurium & Derivatives  Other (See Comments)    Unknown   Atropine Other (See Comments)    Heart racing   Iodine Other (See Comments)    Unknown   Shellfish Allergy Other (See Comments)    Unknown   Statins     "didn't feel right"     Medications Reviewed Today     Reviewed by Mayford Knife, RPH (Pharmacist) on 12/28/20 at 1237  Med List Status: <None>   Medication Order Taking? Sig Documenting Provider Last Dose Status Informant  ALPRAZolam (XANAX) 0.5 MG tablet [id number] Yes Take 0.5 tablets (0.25 mg total) by mouth daily as needed for anxiety. Briscoe Deutscher, DO Taking Active   aspirin 81 MG chewable tablet [id number] Yes Chew 81 mg by mouth daily. [provider] Taking Active Self  BIOTIN PO [id number] Yes Take 1 tablet by mouth daily.  [provider] Taking Active Self  calcium carbonate (OSCAL) 1500 (600 Ca) MG TABS tablet [id number] Yes Take 600 mg of elemental calcium by mouth daily. [provider] Taking Active Self  Cholecalciferol (VITAMIN D3) 25 MCG (1000 UT) CAPS [id number] Yes Take 1,000 Units by mouth daily. [provider] Taking Active Self  DEXILANT 60 MG capsule [id number] Yes TAKE 1 CAPSULE BY MOUTH  DAILY Mansouraty, Telford Nab., MD Taking Active   diltiazem (CARDIZEM) 30 MG tablet [id number] Yes TAKE 1 TABLET BY MOUTH  TWICE DAILY Skeet Latch, MD Taking Active   dorzolamide-timolol (COSOPT) 22.3-6.8 MG/ML ophthalmic solution [id number] Yes Apply 1 drop to eye in the morning and at bedtime. [provider] Taking Active   fluticasone (FLONASE) 50 MCG/ACT nasal spray  [id number] Yes Place 2 sprays into both nostrils daily. Marshell Garfinkel, MD Taking Active            Med Note Pricilla [place] Dec 28, 2020 12:25 PM) Placing 1 drop in both eyes at bedtime and in the morning.   gabapentin (NEURONTIN) 100 MG capsule [id number] No Take 1 capsule (100 mg total) by mouth 3 (three) times daily.  Patient not taking: No sig reported   [name], FNP Not Taking Active   hydrochlorothiazide (HYDRODIURIL) 25 MG tablet [id number] Yes TAKE 1 TABLET BY MOUTH  DAILY [name], FNP Taking Active   latanoprost (XALATAN) 0.005 % ophthalmic solution [id number] Yes Place 1 drop into both eyes at bedtime.  [provider] Taking Active Self  Multiple Vitamins-Minerals (CENTRUM SILVER 50+WOMEN PO) [id number] Yes Take 1 tablet by mouth daily. [provider] Taking Active Self  Omega-3 Fatty Acids (FISH OIL PO) [id number] Yes Take 1 capsule by mouth daily. [provider] Taking Active Self  polycarbophil (FIBERCON) 625 MG tablet [id number] Yes Take 625 mg by mouth daily. [provider] Taking Active Self  polyethylene glycol Merril Abbe / GLYCOLAX) packet [id number] Yes Take 17 g by mouth daily. [provider] Taking Active   RHOPRESSA 0.02 % SOLN [id number] Yes Apply 1 drop to eye daily. [provider] Taking Active             Patient Active Problem List   Diagnosis Date Noted   OSA on CPAP 01/06/2019   Abnormal CT of the abdomen 10/04/2018   Radiculopathy affecting upper extremity, left 07/25/2018   Hyperlipidemia 05/13/2018   Situational anxiety 04/29/2018   MVP (mitral valve prolapse) 04/29/2018   Sleep-disordered breathing 04/29/2018   Family History of Hyperthermia, malignant 04/23/2018   Barrett's esophagus 03/01/2018   Elevated lipase 02/18/2018   Hiatal hernia 02/18/2018  Constipation 02/18/2018   Bloating 02/18/2018   Aortic atherosclerosis ([place]) 01/23/2018   Gastroesophageal reflux disease  without esophagitis    Glaucoma    Hypertension     Immunization History  Administered Date(s) Administered   Fluad Quad(high Dose 65+) 03/11/2019   Influenza, High Dose Seasonal PF 04/29/2018   Influenza-Unspecified 03/16/2020   PFIZER(Purple Top)SARS-COV-2 Vaccination 09/02/2019, 09/28/2019, 03/27/2020   Pneumococcal Conjugate-13 03/11/2019   Pneumococcal Polysaccharide-23 06/09/2017    Conditions to be addressed/monitored:  Hyperlipidemia and GERD  Care Plan : [place]  Updates made by Mayford Knife, [place] since 01/03/2021 12:00 AM     Problem: HLD/Atherosclerosis of Aorta, GERD   Priority: High     Long-Range Goal: Disease Management   Recent Progress: On track  Priority: High  Note:    Current Barriers:  Unable to independently monitor therapeutic efficacy  Pharmacist Clinical Goal(s):  Patient will achieve adherence to monitoring guidelines and medication adherence to achieve therapeutic efficacy through collaboration with PharmD and provider.   Interventions: 1:1 collaboration with [name], FNP regarding development and update of comprehensive plan of care as evidenced by provider attestation and co-signature Inter-disciplinary care team collaboration (see longitudinal plan of care) Comprehensive medication review performed; medication list updated in electronic medical record  Atherosclerosis of Aorta/Hyperlipidemia: (LDL goal < 70) -Controlled -Current treatment: Aspirin 81 mg tablet daily  Hydrochlorothiazide 25 mg tablet once per day  Diltiazem 30 mg tablet twice per day  -Medications previously tried: statins- severe muscle pains, and inflammation   -Current dietary patterns: she reports that she is eating and not eating healthy, she is going to cut back on pastries to once a week, and increase fruit intake.  -Current exercise habits: Patient reports that she has not been exercising as frequently. She is going to walk three times a  day around the track for 3 1/2 miles each time. She also has stabile bicycle that she will ride.  -Educated on Cholesterol goals;  Importance of limiting foods high in cholesterol; Exercise goal of 150 minutes per week; Strategies to manage statin-induced myalgias; Patient is interested in retrying a statin medication.  -Counseled on diet and exercise extensively -Collaborate with PCP to start patient on Atorvastatin 10 mg tablet Monday through fridays  GERD (Goal: Reduce signs and symptoms of acid reflux) -Controlled -Current treatment  Dexilant 60 mg capsule once per day  -Recommended to continue current medication  Health Maintenance -Vaccine gaps:          Shingrix TDAP  PCP in [place]: Confirm vaccination history - Dr. Lazaro Arms: [phone] -Current therapy:  Omega 3 Fish Oil Oscal 1500 MG tablet -Educated on Cost vs benefit of each product must be carefully weighed by individual consumer Supplements may interfere with prescription drugs -Patient is satisfied with current therapy and denies issues -Recommended to continue current medication  Patient Goals/Self-Care Activities Patient will:  - take medications as prescribed  Follow Up Plan: The patient has been provided with contact information for the care management team and has been advised to call with any health related questions or concerns.       Medication Assistance: None required.  Patient affirms current coverage meets needs.  Compliance/Adherence/Medication fill history: Care Gaps: Shingrix Vaccine TDAP COVID-19 Vaccine   Star-Rating Drugs: None Noted at this time   Patient's preferred pharmacy is:  [id number] - [place], [place] [address] Phone: [phone] Fax: [phone]  [facility]  ([place], [place] [address] Phone:  [phone] Fax: [phone]  Uses pill box? No - patient reports that she does not have a pill box Pt endorses 85% compliance  We discussed: Current pharmacy is preferred with insurance plan and patient is satisfied with pharmacy services Patient decided to: Continue current medication management strategy  Care Plan and Follow Up Patient Decision:  Patient agrees to Care Plan and Follow-up.  Plan: The patient has been provided with contact information for the care management team and has been advised to call with any health related questions or concerns.   [name], PharmD Clinical Pharmacist [facility] [phone]

## 2021-01-03 ENCOUNTER — Other Ambulatory Visit: Payer: Self-pay

## 2021-01-03 MED ORDER — ATORVASTATIN CALCIUM 10 MG PO TABS: ORAL_TABLET | ORAL | 0 refills | Status: DC

## 2021-01-03 NOTE — Patient Instructions (Addendum)
Visit Information It was great speaking with you today!  Please let me know if you have any questions about our visit.   Goals Addressed   None     Patient Care Plan: CCM Pharmacy Care Plan     Problem Identified: Atherosclerosis of Aorta, GERD   Priority: High     Long-Range Goal: Disease Management   Recent Progress: On track  Priority: High  Note:     Current Barriers:  Unable to independently monitor therapeutic efficacy  Pharmacist Clinical Goal(s):  Patient will achieve adherence to monitoring guidelines and medication adherence to achieve therapeutic efficacy through collaboration with PharmD and provider.   Interventions: 1:1 collaboration with [name], FNP regarding development and update of comprehensive plan of care as evidenced by provider attestation and co-signature Inter-disciplinary care team collaboration (see longitudinal plan of care) Comprehensive medication review performed; medication list updated in electronic medical record    Atherosclerosis of Aorta/Hyperlipidemia: (LDL goal < 70) -Controlled -Current treatment: Aspirin 81 mg tablet daily  Hydrochlorothiazide 25 mg tablet once per day  Diltiazem 30 mg tablet twice per day  -Medications previously tried: statins- severe muscle pains, and inflammation   -Current dietary patterns: she reports that she is eating and not eating healthy, she is going to cut back on pastries to once a week, and increase fruit intake.  -Current exercise habits: Patient reports that she has not been exercising as frequently. She is going to walk three times a day around the track for 3 1/2 miles each time. She also has stabile bicycle that she will ride.  -Educated on Cholesterol goals;  Importance of limiting foods high in cholesterol; Exercise goal of 150 minutes per week; Strategies to manage statin-induced myalgias; Patient is interested in retrying a statin medication.  -Counseled on diet and exercise  extensively -Collaborate with PCP to start patient on Atorvastatin 10 mg tablet Monday through fridays  GERD (Goal: Reduce signs and symptoms of acid reflux) -Controlled -Current treatment  Dexilant 60 mg capsule once per day  -Recommended to continue current medication  Health Maintenance -Vaccine gaps:          Shingrix TDAP  PCP in [place]: Confirm vaccination history - Dr. Lazaro Arms: [phone] -Current therapy:  Omega 3 Fish Oil Oscal 1500 MG tablet -Educated on Cost vs benefit of each product must be carefully weighed by individual consumer Supplements may interfere with prescription drugs -Patient is satisfied with current therapy and denies issues -Recommended to continue current medication   Patient Goals/Self-Care Activities Patient will:  - take medications as prescribed  Follow Up Plan: The patient has been provided with contact information for the care management team and has been advised to call with any health related questions or concerns.        Patient agreed to services and verbal consent obtained.   The patient verbalized understanding of instructions, educational materials, and care plan provided today and agreed to receive a mailed copy of patient instructions, educational materials, and care plan.   [name], PharmD Clinical Pharmacist [facility] [phone]  Visit Information It was great speaking with you today!  Please let me know if you have any questions about our visit.   Goals Addressed             This Visit's Progress    Manage My Medicine       Timeframe:  Long-Range Goal Priority:  High Start Date:  Expected End Date:                       Follow Up Date: 9/13/20212   - call for medicine refill 2 or 3 days before it runs out - call if I am sick and can't take my medicine - keep a list of all the medicines I take; vitamins and herbals too - use a pillbox to  sort medicine    Why is this important?   These steps will help you keep on track with your medicines.           Patient Care Plan: CCM Pharmacy Care Plan     Problem Identified: Atherosclerosis of Aorta/HLD, GERD   Priority: High     Long-Range Goal: Disease Management   Recent Progress: On track  Priority: High  Note:    Current Barriers:  Unable to independently monitor therapeutic efficacy  Pharmacist Clinical Goal(s):  Patient will achieve adherence to monitoring guidelines and medication adherence to achieve therapeutic efficacy through collaboration with PharmD and provider.   Interventions: 1:1 collaboration with [name], FNP regarding development and update of comprehensive plan of care as evidenced by provider attestation and co-signature Inter-disciplinary care team collaboration (see longitudinal plan of care) Comprehensive medication review performed; medication list updated in electronic medical record  Atherosclerosis of Aorta/Hyperlipidemia: (LDL goal < 70) -Controlled -Current treatment: Aspirin 81 mg tablet daily  Hydrochlorothiazide 25 mg tablet once per day  Diltiazem 30 mg tablet twice per day  -Medications previously tried: statins- severe muscle pains, and inflammation   -Current dietary patterns: she reports that she is eating and not eating healthy, she is going to cut back on pastries to once a week, and increase fruit intake.  -Current exercise habits: Patient reports that she has not been exercising as frequently. She is going to walk three times a day around the track for 3 1/2 miles each time. She also has stabile bicycle that she will ride.  -Educated on Cholesterol goals;  Importance of limiting foods high in cholesterol; Exercise goal of 150 minutes per week; Strategies to manage statin-induced myalgias; Patient is interested in retrying a statin medication.  -Counseled on diet and exercise extensively -Collaborate with PCP to  start patient on Atorvastatin 10 mg tablet Monday through fridays  GERD (Goal: Reduce signs and symptoms of acid reflux) -Controlled -Current treatment  Dexilant 60 mg capsule once per day  -Recommended to continue current medication  Health Maintenance -Vaccine gaps:          Shingrix TDAP  PCP in [place]: Confirm vaccination history - Dr. Lazaro Arms: [phone] -Current therapy:  Omega 3 Fish Oil Oscal 1500 MG tablet -Educated on Cost vs benefit of each product must be carefully weighed by individual consumer Supplements may interfere with prescription drugs -Patient is satisfied with current therapy and denies issues -Recommended to continue current medication  Patient Goals/Self-Care Activities Patient will:  - take medications as prescribed  Follow Up Plan: The patient has been provided with contact information for the care management team and has been advised to call with any health related questions or concerns.        Patient agreed to services and verbal consent obtained.   The patient verbalized understanding of instructions, educational materials, and care plan provided today and agreed to receive a mailed copy of patient instructions, educational materials, and care plan.   [name], PharmD Clinical Pharmacist [facility] [phone]

## 2021-01-04 ENCOUNTER — Telehealth: Payer: Medicare Other

## 2021-01-25 DIAGNOSIS — G4733 Obstructive sleep apnea (adult) (pediatric): Secondary | ICD-10-CM | POA: Diagnosis not present

## 2021-01-26 ENCOUNTER — Encounter: Payer: Self-pay | Admitting: Nurse Practitioner

## 2021-01-31 ENCOUNTER — Telehealth: Payer: Medicare Other

## 2021-01-31 ENCOUNTER — Other Ambulatory Visit: Payer: Self-pay | Admitting: Nurse Practitioner

## 2021-01-31 DIAGNOSIS — Z1231 Encounter for screening mammogram for malignant neoplasm of breast: Secondary | ICD-10-CM

## 2021-02-07 ENCOUNTER — Ambulatory Visit (INDEPENDENT_AMBULATORY_CARE_PROVIDER_SITE_OTHER): Payer: Medicare Other | Admitting: Nurse Practitioner

## 2021-02-07 ENCOUNTER — Other Ambulatory Visit: Payer: Self-pay

## 2021-02-07 ENCOUNTER — Encounter: Payer: Self-pay | Admitting: Nurse Practitioner

## 2021-02-07 VITALS — BP 134/82 | HR 72 | Temp 98.1°F | Ht 63.0 in | Wt 148.0 lb

## 2021-02-07 DIAGNOSIS — Z9989 Dependence on other enabling machines and devices: Secondary | ICD-10-CM | POA: Diagnosis not present

## 2021-02-07 DIAGNOSIS — I10 Essential (primary) hypertension: Secondary | ICD-10-CM

## 2021-02-07 DIAGNOSIS — Z8616 Personal history of COVID-19: Secondary | ICD-10-CM

## 2021-02-07 DIAGNOSIS — E782 Mixed hyperlipidemia: Secondary | ICD-10-CM

## 2021-02-07 DIAGNOSIS — Z789 Other specified health status: Secondary | ICD-10-CM

## 2021-02-07 DIAGNOSIS — R7303 Prediabetes: Secondary | ICD-10-CM

## 2021-02-07 DIAGNOSIS — Z23 Encounter for immunization: Secondary | ICD-10-CM | POA: Diagnosis not present

## 2021-02-07 DIAGNOSIS — G4733 Obstructive sleep apnea (adult) (pediatric): Secondary | ICD-10-CM

## 2021-02-07 DIAGNOSIS — I700 Atherosclerosis of aorta: Secondary | ICD-10-CM | POA: Diagnosis not present

## 2021-02-07 LAB — POCT URINALYSIS DIPSTICK
Bilirubin, UA: NEGATIVE
Blood, UA: NEGATIVE
Glucose, UA: NEGATIVE
Ketones, UA: NEGATIVE
Leukocytes, UA: NEGATIVE
Nitrite, UA: NEGATIVE
Protein, UA: NEGATIVE
Spec Grav, UA: 1.025 (ref 1.010–1.025)
Urobilinogen, UA: 0.2 E.U./dL
pH, UA: 7.5 (ref 5.0–8.0)

## 2021-02-07 MED ORDER — TETANUS-DIPHTH-ACELL PERTUSSIS 5-2.5-18.5 LF-MCG/0.5 IM SUSP
0.5000 mL | Freq: Once | INTRAMUSCULAR | 0 refills | Status: AC
Start: 2021-02-07 — End: 2021-02-07

## 2021-02-07 MED ORDER — SHINGRIX 50 MCG/0.5ML IM SUSR: 0.5000 mL | Freq: Once | INTRAMUSCULAR | 0 refills | Status: AC

## 2021-02-07 NOTE — Patient Instructions (Signed)
Mediterranean Diet A Mediterranean diet refers to food and lifestyle choices that are based on the traditions of countries located on the [place]. This way of eating has been shown to help prevent certain conditions and improve outcomes for people who have chronic diseases, like kidney disease and heart disease. What are tips for following this plan? Lifestyle  Cook and eat meals together with your family, when possible.  Drink enough fluid to keep your urine clear or pale yellow.  Be physically active every day. This includes: ? Aerobic exercise like running or swimming. ? Leisure activities like gardening, walking, or housework.  Get 7-8 hours of sleep each night.  If recommended by your health care provider, drink red wine in moderation. This means 1 glass a day for nonpregnant women and 2 glasses a day for men. A glass of wine equals 5 oz (150 mL). Reading food labels  Check the serving size of packaged foods. For foods such as rice and pasta, the serving size refers to the amount of cooked product, not dry.  Check the total fat in packaged foods. Avoid foods that have saturated fat or trans fats.  Check the ingredients list for added sugars, such as corn syrup.   Shopping  At the grocery store, buy most of your food from the areas near the walls of the store. This includes: ? Fresh fruits and vegetables (produce). ? Grains, beans, nuts, and seeds. Some of these may be available in unpackaged forms or large amounts (in bulk). ? Fresh seafood. ? Poultry and eggs. ? Low-fat dairy products.  Buy whole ingredients instead of prepackaged foods.  Buy fresh fruits and vegetables in-season from local farmers markets.  Buy frozen fruits and vegetables in resealable bags.  If you do not have access to quality fresh seafood, buy precooked frozen shrimp or canned fish, such as tuna, salmon, or sardines.  Buy small amounts of raw or cooked vegetables, salads, or olives from  the deli or salad bar at your store.  Stock your pantry so you always have certain foods on hand, such as olive oil, canned tuna, canned tomatoes, rice, pasta, and beans. Cooking  Cook foods with extra-virgin olive oil instead of using butter or other vegetable oils.  Have meat as a side dish, and have vegetables or grains as your main dish. This means having meat in small portions or adding small amounts of meat to foods like pasta or stew.  Use beans or vegetables instead of meat in common dishes like chili or lasagna.  Experiment with different cooking methods. Try roasting or broiling vegetables instead of steaming or sauteing them.  Add frozen vegetables to soups, stews, pasta, or rice.  Add nuts or seeds for added healthy fat at each meal. You can add these to yogurt, salads, or vegetable dishes.  Marinate fish or vegetables using olive oil, lemon juice, garlic, and fresh herbs. Meal planning  Plan to eat 1 vegetarian meal one day each week. Try to work up to 2 vegetarian meals, if possible.  Eat seafood 2 or more times a week.  Have healthy snacks readily available, such as: ? Vegetable sticks with hummus. ? Greek yogurt. ? Fruit and nut trail mix.  Eat balanced meals throughout the week. This includes: ? Fruit: 2-3 servings a day ? Vegetables: 4-5 servings a day ? Low-fat dairy: 2 servings a day ? Fish, poultry, or lean meat: 1 serving a day ? Beans and legumes: 2 or more servings a week ?   Nuts and seeds: 1-2 servings a day ? Whole grains: 6-8 servings a day ? Extra-virgin olive oil: 3-4 servings a day  Limit red meat and sweets to only a few servings a month   What are my food choices?  Mediterranean diet ? Recommended  Grains: Whole-grain pasta. Brown rice. Bulgar wheat. Polenta. Couscous. Whole-wheat bread. Oatmeal. Quinoa.  Vegetables: Artichokes. Beets. Broccoli. Cabbage. Carrots. Eggplant. Green beans. Chard. Kale. Spinach. Onions. Leeks. Peas. Squash.  Tomatoes. Peppers. Radishes.  Fruits: Apples. Apricots. Avocado. Berries. Bananas. Cherries. Dates. Figs. Grapes. Lemons. Melon. Oranges. Peaches. Plums. Pomegranate.  Meats and other protein foods: Beans. Almonds. Sunflower seeds. Pine nuts. Peanuts. Cod. Salmon. Scallops. Shrimp. Tuna. Tilapia. Clams. Oysters. Eggs.  Dairy: Low-fat milk. Cheese. Greek yogurt.  Beverages: Water. Red wine. Herbal tea.  Fats and oils: Extra virgin olive oil. Avocado oil. Grape seed oil.  Sweets and desserts: Greek yogurt with honey. Baked apples. Poached pears. Trail mix.  Seasoning and other foods: Basil. Cilantro. Coriander. Cumin. Mint. Parsley. Sage. [name]. Tarragon. Garlic. Oregano. Thyme. Pepper. Balsalmic vinegar. Tahini. Hummus. Tomato sauce. Olives. Mushrooms. ? Limit these  Grains: Prepackaged pasta or rice dishes. Prepackaged cereal with added sugar.  Vegetables: Deep fried potatoes (french fries).  Fruits: Fruit canned in syrup.  Meats and other protein foods: Beef. Pork. Lamb. Poultry with skin. Hot dogs. Bacon.  Dairy: Ice cream. Sour cream. Whole milk.  Beverages: Juice. Sugar-sweetened soft drinks. Beer. Liquor and spirits.  Fats and oils: Butter. Canola oil. Vegetable oil. Beef fat (tallow). Lard.  Sweets and desserts: Cookies. Cakes. Pies. Candy.  Seasoning and other foods: Mayonnaise. Premade sauces and marinades. The items listed may not be a complete list. Talk with your dietitian about what dietary choices are right for you. Summary  The Mediterranean diet includes both food and lifestyle choices.  Eat a variety of fresh fruits and vegetables, beans, nuts, seeds, and whole grains.  Limit the amount of red meat and sweets that you eat.  Talk with your health care provider about whether it is safe for you to drink red wine in moderation. This means 1 glass a day for nonpregnant women and 2 glasses a day for men. A glass of wine equals 5 oz (150 mL). This information  is not intended to replace advice given to you by your health care provider. Make sure you discuss any questions you have with your health care provider. Document Revised: 02/01/2016 Document Reviewed: 01/25/2016 Elsevier Patient Education  2020 Elsevier Inc.  

## 2021-02-07 NOTE — Addendum Note (Signed)
Addended by: Luana Shu on: 02/07/2021 12:17 PM   Modules accepted: Orders

## 2021-02-07 NOTE — Progress Notes (Signed)
I,Katawbba Wiggins,acting as a Education administrator for [employer], FNP.,have documented all relevant documentation on the behalf of [name], FNP,as directed by  [name], FNP while in the presence of [name], [place].   This visit occurred during the SARS-CoV-2 public health emergency.  Safety protocols were in place, including screening questions prior to the visit, additional usage of staff PPE, and extensive cleaning of exam room while observing appropriate contact time as indicated for disinfecting solutions.  Subjective:     Patient ID: Terri Barajas , female    DOB: 1946/01/31 , 75 y.o.   MRN: [id number]   Chief Complaint  Patient presents with   Hyperlipidemia    HPI  The patient is here today for a cholesterol follow-up.  She stopped taking the atorvastatin due to side effects.   Hyperlipidemia This is a chronic problem. The current episode started more than 1 year ago. The problem is uncontrolled. She has no history of chronic renal disease. Pertinent negatives include no chest pain. Current antihyperlipidemic treatment includes statins and diet change. Risk factors for coronary artery disease include obesity and a sedentary lifestyle.    Past Medical History:  Diagnosis Date   Anxiety    Arthritis    Complication of anesthesia    some type of sedation medication made her feel crazy   Dysrhythmia    Family history of adverse reaction to anesthesia    daughter has Malignant Hyperthermia hx -    GERD (gastroesophageal reflux disease)    Glaucoma    H/O: hysterectomy 1978   Heart disease    History of hiatal hernia    Hypertension    MVP (mitral valve prolapse)    Panic attacks    Pre-diabetes    Sciatica      Family History  Problem Relation Age of Onset   Pancreatic cancer Mother    Alcohol abuse Father    Asthma Sister    Hypertension Sister    Arthritis Sister    Diabetes Brother    Arthritis Brother    Hyperlipidemia Brother    Hypertension Brother     Glaucoma Brother    Colon polyps Brother    Arthritis Sister    Arthritis Sister    Prostate cancer Brother    Congenital heart disease Brother    Colon cancer Neg Hx    Esophageal cancer Neg Hx    Inflammatory bowel disease Neg Hx    Liver disease Neg Hx    Rectal cancer Neg Hx    Stomach cancer Neg Hx      Current Outpatient Medications:    ALPRAZolam (XANAX) 0.5 MG tablet, Take 0.5 tablets (0.25 mg total) by mouth daily as needed for anxiety., Disp: 45 tablet, Rfl: 0   aspirin 81 MG chewable tablet, Chew 81 mg by mouth daily., Disp: , Rfl:    BIOTIN PO, Take 1 tablet by mouth daily. , Disp: , Rfl:    calcium carbonate (OSCAL) 1500 (600 Ca) MG TABS tablet, Take 600 mg of elemental calcium by mouth daily., Disp: , Rfl:    Cholecalciferol (VITAMIN D3) 25 MCG (1000 UT) CAPS, Take 1,000 Units by mouth daily., Disp: , Rfl:    DEXILANT 60 MG capsule, TAKE 1 CAPSULE BY MOUTH  DAILY, Disp: 90 capsule, Rfl: 3   diltiazem (CARDIZEM) 30 MG tablet, TAKE 1 TABLET BY MOUTH  TWICE DAILY, Disp: 180 tablet, Rfl: 3   dorzolamide-timolol (COSOPT) 22.3-6.8 MG/ML ophthalmic solution, Apply 1 drop to eye  in the morning and at bedtime., Disp: , Rfl:    fluticasone (FLONASE) 50 MCG/ACT nasal spray, Place 2 sprays into both nostrils daily., Disp: 16 g, Rfl: 2   hydrochlorothiazide (HYDRODIURIL) 25 MG tablet, TAKE 1 TABLET BY MOUTH  DAILY, Disp: 90 tablet, Rfl: 3   latanoprost (XALATAN) 0.005 % ophthalmic solution, Place 1 drop into both eyes at bedtime. , Disp: , Rfl:    Multiple Vitamins-Minerals (CENTRUM SILVER 50+WOMEN PO), Take 1 tablet by mouth daily., Disp: , Rfl:    Omega-3 Fatty Acids (FISH OIL PO), Take 1 capsule by mouth daily., Disp: , Rfl:    polycarbophil (FIBERCON) 625 MG tablet, Take 625 mg by mouth daily., Disp: , Rfl:    polyethylene glycol (MIRALAX / GLYCOLAX) packet, Take 17 g by mouth daily., Disp: , Rfl:    RHOPRESSA 0.02 % SOLN, Apply 1 drop to eye daily., Disp: , Rfl:    Tdap  (BOOSTRIX) 5-2.5-18.5 LF-MCG/0.5 injection, Inject 0.5 mLs into the muscle once for 1 dose., Disp: 0.5 mL, Rfl: 0   Zoster Vaccine Adjuvanted (SHINGRIX) injection, Inject 0.5 mLs into the muscle once for 1 dose., Disp: 0.5 mL, Rfl: 0   gabapentin (NEURONTIN) 100 MG capsule, Take 1 capsule (100 mg total) by mouth 3 (three) times daily., Disp: 90 capsule, Rfl: 2   Allergies  Allergen Reactions   Atracurium & Derivatives Other (See Comments)    Unknown   Atropine Other (See Comments)    Heart racing   Iodine Other (See Comments)    Unknown   Shellfish Allergy Other (See Comments)    Unknown   Statins     "didn't feel right"      Review of Systems  Constitutional: Negative.   Respiratory: Negative.    Cardiovascular: Negative.  Negative for chest pain, palpitations and leg swelling.  Gastrointestinal: Negative.   Genitourinary:  Positive for frequency (also notices having to urinate more after having sex).  Psychiatric/Behavioral: Negative.    All other systems reviewed and are negative.   Today's Vitals   02/07/21 1054  BP: 134/82  Pulse: 72  Temp: 98.1 F (36.7 C)  TempSrc: Oral  Weight: 148 lb (67.1 kg)  Height: '5\' 3"'$  (1.6 m)  PainSc: 5   PainLoc: Leg   Body mass index is 26.22 kg/m.  Wt Readings from Last 3 Encounters:  02/07/21 148 lb (67.1 kg)  09/20/20 144 lb (65.3 kg)  08/18/20 143 lb (64.9 kg)    BP Readings from Last 3 Encounters:  02/07/21 134/82  09/20/20 113/71  08/18/20 128/68    Objective:  Physical Exam Vitals reviewed.  Constitutional:      General: She is not in acute distress.    Appearance: Normal appearance.  Cardiovascular:     Rate and Rhythm: Normal rate and regular rhythm.     Pulses: Normal pulses.     Heart sounds: Normal heart sounds. No murmur heard. Pulmonary:     Effort: Pulmonary effort is normal. No respiratory distress.     Breath sounds: Normal breath sounds. No wheezing.  Abdominal:     General: Abdomen is flat. Bowel  sounds are normal. There is no distension.     Palpations: Abdomen is soft.     Tenderness: There is no abdominal tenderness.  Skin:    Capillary Refill: Capillary refill takes less than 2 seconds.  Neurological:     General: No focal deficit present.     Mental Status: She is alert and oriented to  person, place, and time.     Cranial Nerves: No cranial nerve deficit.     Motor: No weakness.  Psychiatric:        Mood and Affect: Mood normal.        Behavior: Behavior normal.        Thought Content: Thought content normal.        Judgment: Judgment normal.        Assessment And Plan:     1. Essential hypertension Comments: Stable, continue current medications and follow up with Dr. Oval Linsey  2. Mixed hyperlipidemia Comments: She did not tolerate atorvastatin, will check lipid panel Pending results we may refer to lipid clinic - Lipid panel  3. Statin intolerance Comments: Unable to tolerate atorvastatin, encouraged to eat low fat diet  4. Prediabetes Comments: No current medications, will check HgbA1c - Hemoglobin A1c  5. Aortic atherosclerosis (HCC) Comments: I have stressed the importance of taking a statin to decrease risk for cardiovascular events  6. OSA on CPAP Comments: Continue use, doing well and feels benefits with her quality of life  7. History of COVID-19 Comments: Had in 2020 and had the MAB infusion at that time. Reports after COVID there was an area on her lungs of concern, followed by Pulmonology  8. Encounter for immunization - Zoster Vaccine Adjuvanted [facility]) injection; Inject 0.5 mLs into the muscle once for 1 dose.  Dispense: 0.5 mL; Refill: 0 - Tdap (BOOSTRIX) 5-2.5-18.5 LF-MCG/0.5 injection; Inject 0.5 mLs into the muscle once for 1 dose.  Dispense: 0.5 mL; Refill: 0    Patient was given opportunity to ask questions. Patient verbalized understanding of the plan and was able to repeat key elements of the plan. All questions were answered to  their satisfaction.  [name], FNP   I, [name], FNP, have reviewed all documentation for this visit. The documentation on 02/07/21 for the exam, diagnosis, procedures, and orders are all accurate and complete.   IF YOU HAVE BEEN REFERRED TO A SPECIALIST, IT MAY TAKE 1-2 WEEKS TO SCHEDULE/PROCESS THE REFERRAL. IF YOU HAVE NOT HEARD FROM US/SPECIALIST IN TWO WEEKS, PLEASE GIVE [place] A CALL AT [phone] X [phone].   THE PATIENT IS ENCOURAGED TO PRACTICE SOCIAL DISTANCING DUE TO THE COVID-19 PANDEMIC.

## 2021-02-08 LAB — LIPID PANEL
Chol/HDL Ratio: 3.7 ratio (ref 0.0–4.4)
Cholesterol, Total: 228 mg/dL — ABNORMAL HIGH (ref 100–199)
HDL: 62 mg/dL (ref >39)
LDL Chol Calc (NIH): 144 mg/dL — ABNORMAL HIGH (ref 0–99)
Triglycerides: 127 mg/dL (ref 0–149)
VLDL Cholesterol Cal: 22 mg/dL (ref 5–40)

## 2021-02-08 LAB — HEMOGLOBIN A1C
Est. average glucose Bld gHb Est-mCnc: 128 mg/dL
Hgb A1c MFr Bld: 6.1 % — ABNORMAL HIGH (ref 4.8–5.6)

## 2021-02-09 ENCOUNTER — Encounter: Payer: Self-pay | Admitting: Nurse Practitioner

## 2021-02-10 ENCOUNTER — Other Ambulatory Visit: Payer: Self-pay | Admitting: Nurse Practitioner

## 2021-02-10 DIAGNOSIS — I10 Essential (primary) hypertension: Secondary | ICD-10-CM

## 2021-02-16 ENCOUNTER — Telehealth: Payer: Medicare Other

## 2021-02-16 ENCOUNTER — Telehealth: Payer: Self-pay

## 2021-02-16 NOTE — Telephone Encounter (Signed)
  Care Management   Follow Up Note   02/16/2021 Name: Terri Barajas MRN: [id number] DOB: February 14, 1946   Referred by: [name], FNP Reason for referral : Chronic Care Management (RN CM follow up call )   An unsuccessful telephone outreach was attempted today. The patient was referred to the case management team for assistance with care management and care coordination.   Follow Up Plan: A HIPPA compliant phone message was left for the patient providing contact information and requesting a return call.    Barb Merino, RN, BSN, CCM Care Management Coordinator [facility]  Direct Phone: [phone]

## 2021-02-16 NOTE — Progress Notes (Signed)
This encounter was created in error - please disregard.

## 2021-02-26 ENCOUNTER — Telehealth: Payer: Self-pay

## 2021-02-26 NOTE — Chronic Care Management (AMB) (Signed)
  Tionne Ambler Treloar was reminded to have all medications, supplements and any blood glucose and blood pressure readings available for review with [name], Pharm. D, at her telephone visit on 02-27-2021 at 12:15. Patient is aware that her appointment time will be around 12:30.   Questions: Have you had any recent office visit or specialist visit outside of [place]? None  Are there any concerns you would like to discuss during your office visit? Patient states she still has pain in her left leg. Patient states no one can seem to help her with this issue.  Are you having any problems obtaining your medications? (Whether it pharmacy issues or cost) Patient stated no.  If patient has any PAP medications ask if they are having any problems getting their PAP medication or refill? Patient stated no  Care Gaps: Tdap overdue Shingrix overdue Covid booster overdue RAF= 1.31%  Star Rating Drug: None  Any gaps in medications fill history? No  [place] Pharmacist Assistant [phone]

## 2021-02-27 ENCOUNTER — Telehealth: Payer: Self-pay

## 2021-02-28 ENCOUNTER — Other Ambulatory Visit: Payer: Self-pay

## 2021-02-28 ENCOUNTER — Ambulatory Visit: Payer: Medicare Other | Admitting: Podiatry

## 2021-02-28 DIAGNOSIS — M722 Plantar fascial fibromatosis: Secondary | ICD-10-CM | POA: Diagnosis not present

## 2021-02-28 NOTE — Progress Notes (Signed)
   HPI: 75 y.o. female presenting today for multiple new complaints regarding bilateral feet.  Patient does have history of endoscopic plantar fasciotomy which is doing well.  Past Medical History:  Diagnosis Date   Anxiety    Arthritis    Complication of anesthesia    some type of sedation medication made her feel crazy   Dysrhythmia    Family history of adverse reaction to anesthesia    daughter has Malignant Hyperthermia hx -    GERD (gastroesophageal reflux disease)    Glaucoma    H/O: hysterectomy 1978   Heart disease    History of hiatal hernia    Hypertension    MVP (mitral valve prolapse)    Panic attacks    Pre-diabetes    Sciatica      Physical Exam: General: The patient is alert and oriented x3 in no acute distress.  Dermatology: Skin is warm, dry and supple bilateral lower extremities. Negative for open lesions or macerations.  It appears that there is an old nail plate growing and overlying new nail growth to the left third toe.  Mostly asymptomatic.  Vascular: Palpable pedal pulses bilaterally. No edema or erythema noted. Capillary refill within normal limits.  Neurological: Epicritic and protective threshold grossly intact bilaterally.   Musculoskeletal Exam: Range of motion within normal limits to all pedal and ankle joints bilateral. Muscle strength 5/5 in all groups bilateral.  Mildly symptomatic plantar fibromas noted along the medial longitudinal arch of the bilateral feet.  Associated tenderness to palpation.  Mild hallux abductovalgus deformity also noted bilateral with a hypertrophic medial eminence and slight lateral deviation of the great toes   Assessment: 1.  Old nail growth overlying new nail growth left third toe; asymptomatic 2.  Plantar fibromas bilateral 3.  Mild hallux valgus deformity bilateral   Plan of Care:  1. Patient evaluated. 2.  Regarding the hallux valgus deformity as well as the plantar fibromas I recommend good supportive shoes  that do not irritate or agitate the areas.  Explained to the patient that the only way to truly correct for a bunion deformity is surgery and the patient is not interested in surgery.  I did offer steroid injection into the plantar fibromas to help reduce some of the inflammation however the patient declined 3.  Recommend good supportive shoes again and conservative treatment modalities.  Also regarding the overlying nail growth of the left third toe, the patient was very protective and did not want me to debride the nail.  Recommend that she simply lets the nail grow out with time 4.  Return to clinic as needed      Edrick Kins, DPM [facility]  Dr. Edrick Kins, DPM    [address], [place] [address]                Office [phone]  Fax [phone]

## 2021-03-02 ENCOUNTER — Ambulatory Visit
Admission: RE | Admit: 2021-03-02 | Discharge: 2021-03-02 | Disposition: A | Discharge status: Home / Self Care | Payer: Medicare Other | Source: Ambulatory Visit | Attending: Nurse Practitioner | Admitting: Nurse Practitioner

## 2021-03-02 ENCOUNTER — Other Ambulatory Visit: Payer: Self-pay

## 2021-03-02 DIAGNOSIS — Z1231 Encounter for screening mammogram for malignant neoplasm of breast: Secondary | ICD-10-CM | POA: Diagnosis not present

## 2021-03-06 ENCOUNTER — Telehealth: Payer: Self-pay

## 2021-03-06 NOTE — Chronic Care Management (AMB) (Signed)
Chronic Care Management Pharmacy Assistant   Name: Terri Barajas  MRN: [id number] DOB: Aug 01, 1945   Reason for Encounter: Disease State/ Hypertension  Recent office visits:  None  Recent consult visits:  02-28-2021 Edrick Kins, DPM (Podiatry). Follow up  Hospital visits:  None in previous 6 months  Medications: Outpatient Encounter Medications as of 03/06/2021  Medication Sig Note   ALPRAZolam (XANAX) 0.5 MG tablet Take 0.5 tablets (0.25 mg total) by mouth daily as needed for anxiety.    aspirin 81 MG chewable tablet Chew 81 mg by mouth daily.    BIOTIN PO Take 1 tablet by mouth daily.     calcium carbonate (OSCAL) 1500 (600 Ca) MG TABS tablet Take 600 mg of elemental calcium by mouth daily.    Cholecalciferol (VITAMIN D3) 25 MCG (1000 UT) CAPS Take 1,000 Units by mouth daily.    DEXILANT 60 MG capsule TAKE 1 CAPSULE BY MOUTH  DAILY    diltiazem (CARDIZEM) 30 MG tablet TAKE 1 TABLET BY MOUTH  TWICE DAILY    dorzolamide-timolol (COSOPT) 22.3-6.8 MG/ML ophthalmic solution Apply 1 drop to eye in the morning and at bedtime.    fluticasone (FLONASE) 50 MCG/ACT nasal spray Place 2 sprays into both nostrils daily. 12/28/2020: Placing 1 drop in both eyes at bedtime and in the morning.    gabapentin (NEURONTIN) 100 MG capsule Take 1 capsule (100 mg total) by mouth 3 (three) times daily.    hydrochlorothiazide (HYDRODIURIL) 25 MG tablet TAKE 1 TABLET BY MOUTH  DAILY    latanoprost (XALATAN) 0.005 % ophthalmic solution Place 1 drop into both eyes at bedtime.     Multiple Vitamins-Minerals (CENTRUM SILVER 50+WOMEN PO) Take 1 tablet by mouth daily.    Omega-3 Fatty Acids (FISH OIL PO) Take 1 capsule by mouth daily.    polycarbophil (FIBERCON) 625 MG tablet Take 625 mg by mouth daily.    polyethylene glycol (MIRALAX / GLYCOLAX) packet Take 17 g by mouth daily.    RHOPRESSA 0.02 % SOLN Apply 1 drop to eye daily.    No facility-administered encounter medications on file as of  03/06/2021.   Reviewed chart prior to disease state call. Spoke with patient regarding BP  Recent Office Vitals: BP Readings from Last 3 Encounters:  02/07/21 134/82  09/20/20 113/71  08/18/20 128/68   Pulse Readings from Last 3 Encounters:  02/07/21 72  09/20/20 77  08/18/20 90    Wt Readings from Last 3 Encounters:  02/07/21 148 lb (67.1 kg)  09/20/20 144 lb (65.3 kg)  08/18/20 143 lb (64.9 kg)     Kidney Function Lab Results  Component Value Date/Time   CREATININE 0.79 08/18/2020 12:38 PM   CREATININE 0.82 03/01/2020 11:06 AM   GFR 90.57 08/03/2019 01:40 PM   GFRNONAA 72 03/01/2020 11:06 AM   GFRAA 83 03/01/2020 11:06 AM    BMP Latest Ref Rng & Units 08/18/2020 03/01/2020 02/07/2020  Glucose 65 - 99 mg/dL 110(H) 106(H) 127(H)  BUN 8 - 27 mg/dL 20 21 21   Creatinine 0.57 - 1.00 mg/dL 0.79 0.82 0.73  BUN/Creat Ratio 12 - 28 25 26  29(H)  Sodium 134 - 144 mmol/L 139 140 138  Potassium 3.5 - 5.2 mmol/L 3.4(L) 4.0 3.3(L)  Chloride 96 - 106 mmol/L 98 100 95(L)  CO2 20 - 29 mmol/L 28 30(H) 27  Calcium 8.7 - 10.3 mg/dL 9.7 10.0 10.0    Current antihypertensive regimen:  HCTZ 25 mg daily Diltiazem 30 mg twice daily  How often are you checking your Blood Pressure? infrequently  Current home BP readings: Patient states she hasn't been checking blood pressure  What recent interventions/DTPs have been made by any provider to improve Blood Pressure control since last CPP Visit:  Patient states she takes medications as directed.  Any recent hospitalizations or ED visits since last visit with CPP? No  What diet changes have been made to improve Blood Pressure Control?  Patient states she limits her salt intake, drinks plenty of water, eat vegetables and fruits.  What exercise is being done to improve your Blood Pressure Control?  Patient states she was exercising but wasn't feeling good lately. Patient states she will start back soon.  Adherence Review: Is the patient  currently on ACE/ARB medication? No Does the patient have >5 day gap between last estimated fill dates? No  Care Gaps: Flu vaccine overdue last completed 02-14-2020  Star Rating Drugs: None  Jeannette How [facility] Clinical Pharmacist Assistant [phone]

## 2021-03-07 ENCOUNTER — Ambulatory Visit (INDEPENDENT_AMBULATORY_CARE_PROVIDER_SITE_OTHER)
Admission: RE | Admit: 2021-03-07 | Discharge: 2021-03-07 | Disposition: A | Discharge status: Home / Self Care | Payer: Medicare Other | Source: Ambulatory Visit | Attending: Pulmonary Disease | Admitting: Pulmonary Disease

## 2021-03-07 ENCOUNTER — Other Ambulatory Visit: Payer: Self-pay

## 2021-03-07 DIAGNOSIS — R911 Solitary pulmonary nodule: Secondary | ICD-10-CM | POA: Diagnosis not present

## 2021-03-07 DIAGNOSIS — J849 Interstitial pulmonary disease, unspecified: Secondary | ICD-10-CM | POA: Diagnosis not present

## 2021-03-07 DIAGNOSIS — R0602 Shortness of breath: Secondary | ICD-10-CM | POA: Diagnosis not present

## 2021-03-07 DIAGNOSIS — I700 Atherosclerosis of aorta: Secondary | ICD-10-CM | POA: Diagnosis not present

## 2021-03-08 ENCOUNTER — Ambulatory Visit (HOSPITAL_BASED_OUTPATIENT_CLINIC_OR_DEPARTMENT_OTHER): Payer: Medicare Other | Admitting: Cardiovascular Disease

## 2021-03-08 VITALS — BP 114/72 | HR 77 | Ht 63.0 in | Wt 147.5 lb

## 2021-03-08 DIAGNOSIS — I10 Essential (primary) hypertension: Secondary | ICD-10-CM

## 2021-03-08 DIAGNOSIS — E782 Mixed hyperlipidemia: Secondary | ICD-10-CM

## 2021-03-08 DIAGNOSIS — I700 Atherosclerosis of aorta: Secondary | ICD-10-CM | POA: Diagnosis not present

## 2021-03-08 DIAGNOSIS — Z789 Other specified health status: Secondary | ICD-10-CM | POA: Diagnosis not present

## 2021-03-08 NOTE — Progress Notes (Signed)
Cardiology Office Note   Date:  03/11/2021   ID:  Terri, Barajas 1945-09-12, MRN [id number]  PCP:  [name], FNP  Cardiologist:   Skeet Latch, MD   No chief complaint on file.    History of Present Illness: Terri Barajas is a 75 y.o. female with hypertension, hyperlipidemia, Barrett's esophagus here for follow up.  She was initially seen 06/2018 for the evaluation of abnormal EKG.  Terri Barajas has a long history of mitral valve prolapse and was seeing a cardiologist in [place] before moving to [place].  She has had this diagnosis for at least 10 years.  She also has been treated with nadolol for palpitations.  She does not recall being told she had any specific arrhythmia diagnosis.  The nadolol has controlled it well.  According to her outside records she had an echo 09/2017 that revealed LVEF 60 to 65% with trivial mitral regurgitation and mild tricuspid regurgitation. No mitral valve prolapse was noted on this echo.  She reports having a stress test in 2019 that was performed to follow-up her mitral valve disease but she does not recall having any symptoms of chest pain or shortness of breath at the time.  She had an EKG in January 2020 that revealed anterior T wave inversions.  At the time she was being seen in the ED for epigastric and arm pain.  She has a longstanding history of intermittent abdominal distention and bloating.  She is has undergone upper endoscopies and biopsies that revealed Barrett's esophagus gastritis.  On that particular day her epigastric discomfort was associated with left arm pain.  Cardiac enzymes were negative.  She was instructed to follow-up with cardiology as an outpatient. Terri Barajas was referred for a cardiac CT-A but it could not be performed due to tachycardia.  Instead she had a [employer] 08/2018 that revealed LVEF 64% and no ischemia.   Terri Barajas was not taking her diuretic because she did not like how it  made her go to the bathroom so often.  Therefore hydrochlorothiazide was switched to amlodipine, as her blood pressure was also elevated.  She followed up with our pharmacist 05/2019 and her blood pressure was well have been controlled.  She switched back to HCTZ because she didn't like how amlodipine made her feel.  She started taking the hydrochlorothiazide daily and no longer has issues with frequent urination.  The morning nadolol was switched to metoprolol due to insurance reasons.  She had headache so this was changed to diltiazem.  Since her last appointment she became positive for COVID-19.  She and her husband were both treated at the outpatient [facility] infusion center.  At her last appointment she was started on diltiazem for tachycardia. She hasn't noticed any change in her heart rate.   She had a calcium score 10/2020 that was 0.  Terri Barajas notes that she has been feeling poorly for a couple months.  She is tired and has no energy.  She feels like she has to make herself go.  She wonders if she is depressed.  She coughs a lot and feels like she can't breathe normally.  She goes to the gym sporadically and feels fine with exercise.  She walks and does the elliptical or weights.  She has no exertional symptoms.  She has swelling in her knee and ankles but no edema, orthopnea or PND.  She has no exertional chest pain or shortness of breath..   Past  Medical History:  Diagnosis Date   Anxiety    Arthritis    Complication of anesthesia    some type of sedation medication made her feel crazy   Dysrhythmia    Family history of adverse reaction to anesthesia    daughter has Malignant Hyperthermia hx -    GERD (gastroesophageal reflux disease)    Glaucoma    H/O: hysterectomy 1978   Heart disease    History of hiatal hernia    Hypertension    MVP (mitral valve prolapse)    Panic attacks    Pre-diabetes    Sciatica     Past Surgical History:  Procedure Laterality Date    APPENDECTOMY  1972   BIOPSY  07/06/2018   Procedure: BIOPSY;  Surgeon: Irving Copas., MD;  Location: [facility];  Service: Gastroenterology;;   CATARACT EXTRACTION  2013   [place] and 1978   COLONOSCOPY     ESOPHAGOGASTRODUODENOSCOPY (EGD) WITH PROPOFOL N/A 07/06/2018   Procedure: ESOPHAGOGASTRODUODENOSCOPY (EGD) WITH PROPOFOL;  Surgeon: Irving Copas., MD;  Location: [facility];  Service: Gastroenterology;  Laterality: N/A;   HAND SURGERY Bilateral    HEMORRHOID SURGERY  2018   S/P Hysterectomy   1987     Current Outpatient Medications  Medication Sig Dispense Refill   ALPRAZolam (XANAX) 0.5 MG tablet Take 0.5 tablets (0.25 mg total) by mouth daily as needed for anxiety. 45 tablet 0   aspirin 81 MG chewable tablet Chew 81 mg by mouth daily.     BIOTIN PO Take 1 tablet by mouth daily.      calcium carbonate (OSCAL) 1500 (600 Ca) MG TABS tablet Take 600 mg of elemental calcium by mouth daily.     Cholecalciferol (VITAMIN D3) 25 MCG (1000 UT) CAPS Take 1,000 Units by mouth daily.     DEXILANT 60 MG capsule TAKE 1 CAPSULE BY MOUTH  DAILY 90 capsule 3   diltiazem (CARDIZEM) 30 MG tablet TAKE 1 TABLET BY MOUTH  TWICE DAILY 180 tablet 3   dorzolamide-timolol (COSOPT) 22.3-6.8 MG/ML ophthalmic solution Apply 1 drop to eye in the morning and at bedtime.     fluticasone (FLONASE) 50 MCG/ACT nasal spray Place 2 sprays into both nostrils daily. 16 g 2   hydrochlorothiazide (HYDRODIURIL) 25 MG tablet TAKE 1 TABLET BY MOUTH  DAILY 90 tablet 3   latanoprost (XALATAN) 0.005 % ophthalmic solution Place 1 drop into both eyes at bedtime.      Multiple Vitamins-Minerals (CENTRUM SILVER 50+WOMEN PO) Take 1 tablet by mouth daily.     Omega-3 Fatty Acids (FISH OIL PO) Take 1 capsule by mouth daily.     polycarbophil (FIBERCON) 625 MG tablet Take 625 mg by mouth daily.     polyethylene glycol (MIRALAX / GLYCOLAX) packet Take 17 g by mouth daily.     Red Yeast Rice Extract  (RED YEAST RICE PO) Take 1 capsule by mouth daily.     RHOPRESSA 0.02 % SOLN Apply 1 drop to eye daily.     No current facility-administered medications for this visit.    Allergies:   Atracurium & derivatives, Atropine, Iodine, Shellfish allergy, and Statins    Social History:  The patient  reports that she has never smoked. She has never used smokeless tobacco. She reports that she does not currently use alcohol. She reports that she does not currently use drugs.   Family History:  [name] family history includes Alcohol abuse in her father; Arthritis in  her brother, sister, sister, and sister; Asthma in her sister; Colon polyps in her brother; Congenital heart disease in her brother; Diabetes in her brother; Glaucoma in her brother; Hyperlipidemia in her brother; Hypertension in her brother and sister; Pancreatic cancer in her mother; Prostate cancer in her brother.    ROS:  Please see the history of present illness.   Otherwise, review of systems are positive for none.   All other systems are reviewed and negative.    PHYSICAL EXAM: VS:  BP 114/72   Pulse 77   Ht 5\' 3"  (1.6 m)   Wt 147 lb 8 oz (66.9 kg)   LMP  (LMP Unknown)   BMI 26.13 kg/m  , BMI Body mass index is 26.13 kg/m. GENERAL:  Well appearing HEENT: Pupils equal round and reactive, fundi not visualized, oral mucosa unremarkable NECK:  No jugular venous distention, waveform within normal limits, carotid upstroke brisk and symmetric, no bruits LUNGS:  Clear to auscultation bilaterally HEART:  RRR.  PMI not displaced or sustained,S1 and S2 within normal limits, no S3, no S4, no clicks, no rubs, no murmurs ABD:  Flat, positive bowel sounds normal in frequency in pitch, no bruits, no rebound, no guarding, no midline pulsatile mass, no hepatomegaly, no splenomegaly EXT:  2 plus pulses throughout, no edema, no cyanosis no clubbing SKIN:  No rashes no nodules NEURO:  Cranial nerves II through XII grossly intact, motor  grossly intact throughout PSYCH:  Cognitively intact, oriented to person place and time   EKG:  EKG is ordered today. The ekg ordered 07/13/18 demonstrates sinus rhythm.  Rate 70 bpm.  Non-specific ST changes.  04/15/19: Sinus rate 79 bpm.  Nonspecific ST changes.  11/19/19: Sinus rhythm.  Rate 70 bpm.   03/08/21: Sinus rhythm.  Rate 77 bpm.   Recent Labs: 08/18/2020: ALT 16; BUN 20; Creatinine, Ser 0.79; Hemoglobin 12.6; Platelets 271; Potassium 3.4; Sodium 139    Lipid Panel    Component Value Date/Time   CHOL 228 (H) 02/07/2021 1220   TRIG 127 02/07/2021 1220   HDL 62 02/07/2021 1220   CHOLHDL 3.7 02/07/2021 1220   CHOLHDL 5 05/27/2018 1045   VLDL 38.2 05/27/2018 1045   LDLCALC 144 (H) 02/07/2021 1220      Wt Readings from Last 3 Encounters:  03/08/21 147 lb 8 oz (66.9 kg)  02/07/21 148 lb (67.1 kg)  09/20/20 144 lb (65.3 kg)      ASSESSMENT AND PLAN:  # Atypical chest pain: # Hyperlipidemia: # Aorta atherosclerosis: Chest CTA was negative for CAD 07/2018 but she did have atherosclerosis of the aorta.  Lids are poorly controlled and she hasn't tolerated multiple statins.  We will refer her to our PharmD to consider PCSK9 inhibitor.  # Hypertension: Blood pressure well-controlled.  Continue hydrochlorothiazide and diltiazem.  # Mitral valve prolapse: There is no mention of mitral valve prolapse on her last 2 echocardiograms.  She had only trivial mitral irritation.  There is no evidence of heart failure on exam.  No need to repeat echocardiogram at this time.  Nadolol was switched to metoprolol and now diltiazem.    Current medicines are reviewed at length with the patient today.  The patient does not have concerns regarding medicines.  The following changes have been made:  no change  Labs/ tests ordered today include:   Orders Placed This Encounter  Procedures   AMB Referral to [facility] Pharm-D   EKG 12-Lead      Disposition:  FU with Chardae Mulkern C.  Oval Linsey, MD, [facility] in 1 year.    Signed, Amreen Raczkowski C. Oval Linsey, MD, [facility]  03/11/2021 2:34 PM    [facility]

## 2021-03-08 NOTE — Patient Instructions (Signed)
Medication Instructions:  Your physician recommends that you continue on your current medications as directed. Please refer to the Current Medication list given to you today.  *If you need a refill on your cardiac medications before your next appointment, please call your pharmacy*  Lab Work: NONE   Testing/Procedures: NONE   Follow-Up: At [facility], you and your health needs are our priority.  As part of our continuing mission to provide you with exceptional heart care, we have created designated Provider Care Teams.  These Care Teams include your primary Cardiologist (physician) and Advanced Practice Providers (APPs -  Physician Assistants and Nurse Practitioners) who all work together to provide you with the care you need, when you need it.  We recommend signing up for the patient portal called "MyChart".  Sign up information is provided on this After Visit Summary.  MyChart is used to connect with patients for Virtual Visits (Telemedicine).  Patients are able to view lab/test results, encounter notes, upcoming appointments, etc.  Non-urgent messages can be sent to your provider as well.   To learn more about what you can do with MyChart, go to [email or web address].    Your next appointment:   12 month(s)  The format for your next appointment:   In Person  Provider:   Skeet Latch, MD or Laurann Montana, NP  You have been referred to [facility]

## 2021-03-11 ENCOUNTER — Encounter (HOSPITAL_BASED_OUTPATIENT_CLINIC_OR_DEPARTMENT_OTHER): Payer: Self-pay | Admitting: Cardiovascular Disease

## 2021-03-26 DIAGNOSIS — M1712 Unilateral primary osteoarthritis, left knee: Secondary | ICD-10-CM | POA: Diagnosis not present

## 2021-03-26 DIAGNOSIS — M7062 Trochanteric bursitis, left hip: Secondary | ICD-10-CM | POA: Diagnosis not present

## 2021-03-26 DIAGNOSIS — M7052 Other bursitis of knee, left knee: Secondary | ICD-10-CM | POA: Diagnosis not present

## 2021-03-27 ENCOUNTER — Other Ambulatory Visit: Payer: Self-pay

## 2021-03-27 ENCOUNTER — Ambulatory Visit: Payer: Medicare Other | Admitting: Pharmacist

## 2021-03-27 ENCOUNTER — Telehealth: Payer: Self-pay

## 2021-03-27 VITALS — BP 120/84 | HR 73 | Resp 14 | Ht 63.0 in | Wt 146.4 lb

## 2021-03-27 DIAGNOSIS — E782 Mixed hyperlipidemia: Secondary | ICD-10-CM | POA: Diagnosis not present

## 2021-03-27 DIAGNOSIS — M791 Myalgia, unspecified site: Secondary | ICD-10-CM

## 2021-03-27 DIAGNOSIS — T466X5A Adverse effect of antihyperlipidemic and antiarteriosclerotic drugs, initial encounter: Secondary | ICD-10-CM

## 2021-03-27 NOTE — Chronic Care Management (AMB) (Addendum)
    Called Terri Barajas, No answer, left message of appointment on 03-28-2021 at 8:30 via telephone visit with [name], Pharm D. Notified to have all medications, supplements, blood pressure and/or blood sugar logs available during appointment and to return call if need to reschedule.  03-28-2021: left patient a voicemail informing her that [name] is running behind and will be calling her in about 30 minutes.  Care Gaps: Flu vaccine overdue last completed 02-14-2020 Shingrix overdue Tdap overdue  Star Rating Drug: None  Any gaps in medications fill history? No  [place] Pharmacist Assistant [phone]

## 2021-03-27 NOTE — Patient Instructions (Signed)
It was nice meeting you today!  We would like your LDL (bad cholesterol) to be less than 70  We recommend starting a new medication called Repatha which you will inject once every 2 weeks  You can go to [email or web address].com to view the video of how to inject.  It will also have other information about how it works and possible side effects  If you decide to move forward, please call and let us know.  We will complete the prior authorization for you and call you when it is approved  We will then check your cholesterol again in 2-3 months.  I can add an A1c for you as well  Please call with any questions!  Karren Cobble, PharmD, BCACP, [place], [place] [address] [place], [place] [address] Phone: [phone]; Fax: [phone] 03/27/2021 10:40 AM

## 2021-03-27 NOTE — Progress Notes (Signed)
Patient ID: Terri Barajas                 DOB: 1946-01-14                    MRN: [id number]     HPI: Terri Barajas is a 75 y.o. female patient referred to lipid clinic by Dr Oval Linsey. PMH is significant for HTN, aortic atherosclerosis, mitral valve prolapse, pre DM, and HLD.  Patient presents today in good spirits.  Had a coronary calcium score of zero but is concerned regarding diagnosis of aortic atherosclerosis and what it meant.  Had previously been on atorvastatin but was intolerant due to myalgias.  Is not sure about family history of CAD in parent sbut knows both brother and sister have elevated cholesterol and are treated with statins.        Tries to watch diet but admits to occasional sodas.  Had back surgery earlier this year and had to walk with a crutch.  Reports her left leg is now weaker than right leg.  Has been learning new exercises for strength training.     Current Medications: Fish oil Intolerances: atorvastatin Risk Factors: HTN, aortic atherosclersis LDL goal: <70  Family History: brother and sister.    Labs:She had a calcium score 10/2020 that was 0.  TC 228, HDL 62, Trigs 127, LDL 144 (02/07/21)  19.3% Intermediate Current 10-Year ASCVD Risk**  Past Medical History:  Diagnosis Date   Anxiety    Arthritis    Complication of anesthesia    some type of sedation medication made her feel crazy   Dysrhythmia    Family history of adverse reaction to anesthesia    daughter has Malignant Hyperthermia hx -    GERD (gastroesophageal reflux disease)    Glaucoma    H/O: hysterectomy 1978   Heart disease    History of hiatal hernia    Hypertension    MVP (mitral valve prolapse)    Panic attacks    Pre-diabetes    Sciatica     Current Outpatient Medications on File Prior to Visit  Medication Sig Dispense Refill   ALPRAZolam (XANAX) 0.5 MG tablet Take 0.5 tablets (0.25 mg total) by mouth daily as needed for anxiety. 45 tablet 0   aspirin 81 MG  chewable tablet Chew 81 mg by mouth daily.     BIOTIN PO Take 1 tablet by mouth daily.      calcium carbonate (OSCAL) 1500 (600 Ca) MG TABS tablet Take 600 mg of elemental calcium by mouth daily.     Cholecalciferol (VITAMIN D3) 25 MCG (1000 UT) CAPS Take 1,000 Units by mouth daily.     DEXILANT 60 MG capsule TAKE 1 CAPSULE BY MOUTH  DAILY 90 capsule 3   diltiazem (CARDIZEM) 30 MG tablet TAKE 1 TABLET BY MOUTH  TWICE DAILY 180 tablet 3   dorzolamide-timolol (COSOPT) 22.3-6.8 MG/ML ophthalmic solution Apply 1 drop to eye in the morning and at bedtime.     fluticasone (FLONASE) 50 MCG/ACT nasal spray Place 2 sprays into both nostrils daily. 16 g 2   hydrochlorothiazide (HYDRODIURIL) 25 MG tablet TAKE 1 TABLET BY MOUTH  DAILY 90 tablet 3   latanoprost (XALATAN) 0.005 % ophthalmic solution Place 1 drop into both eyes at bedtime.      Multiple Vitamins-Minerals (CENTRUM SILVER 50+WOMEN PO) Take 1 tablet by mouth daily.     Omega-3 Fatty Acids (FISH OIL PO) Take 1 capsule by mouth daily.  polycarbophil (FIBERCON) 625 MG tablet Take 625 mg by mouth daily.     polyethylene glycol (MIRALAX / GLYCOLAX) packet Take 17 g by mouth daily.     Red Yeast Rice Extract (RED YEAST RICE PO) Take 1 capsule by mouth daily.     RHOPRESSA 0.02 % SOLN Apply 1 drop to eye daily.     No current facility-administered medications on file prior to visit.    Allergies  Allergen Reactions   Atracurium & Derivatives Other (See Comments)    Unknown   Atropine Other (See Comments)    Heart racing   Iodine Other (See Comments)    Unknown   Shellfish Allergy Other (See Comments)    Unknown   Statins     "didn't feel right"     Assessment/Plan:  1. Hyperlipidemia - Patient LDL 144 which is above goal of <70.  Luckily coronary calcium score ), however patient has aortic atherosclerosis and requires pharmacologic therapy.  However is intolerant to statins.  Discussed next pharmacologic steps including PCSK9i,  Nexletol, and Leqvio.  Explained pros, cons, costs, and mechanisms of actions of each medication class.  Patient initially interested in Regino Ramirez because it was an oral option until I explained its is proven to lower LDL, however is not proven to lower risk of heart attack like PCSK9i are.    Patient skeptical of injecting.  Played patient video from [facility] website showing how to prepare injection site and how to inject.  Using demo pen, explained mechanism of action, storage, site selection, and administration.  Patient was able to demonstrate in room.    Patient still hesitant regarding self injections.  Requested patient handouts and more information for her to look into.  Patient will call back with her decision.  Karren Cobble, PharmD, BCACP, [place], [place] [address] [place], [place] [address] Phone: [phone]; Fax: [phone] 03/27/2021 5:51 PM

## 2021-03-28 ENCOUNTER — Ambulatory Visit (INDEPENDENT_AMBULATORY_CARE_PROVIDER_SITE_OTHER): Payer: Medicare Other

## 2021-03-28 ENCOUNTER — Encounter: Payer: Self-pay | Admitting: Nurse Practitioner

## 2021-03-28 DIAGNOSIS — E782 Mixed hyperlipidemia: Secondary | ICD-10-CM

## 2021-03-28 DIAGNOSIS — G729 Myopathy, unspecified: Secondary | ICD-10-CM | POA: Insufficient documentation

## 2021-03-28 DIAGNOSIS — I10 Essential (primary) hypertension: Secondary | ICD-10-CM

## 2021-03-28 NOTE — Progress Notes (Signed)
Chronic Care Management Pharmacy Note  04/03/2021 Name:  Terri Barajas MRN:  [id number] DOB:  1946-01-22  Summary: Patient reports that she is doing well but is trying to figure out what medication she should take for her cholesterol.   Recommendations/Changes made from today's visit: Recommend patient receive COVID-19 booster shot when she is eligible on 04/28/2021  Plan: Patient to receive COVID-19 booster after 11/12.    Subjective: Terri Barajas is an 75 y.o. year old female who is a primary patient of [name], [place].  The CCM team was consulted for assistance with disease management and care coordination needs.    Engaged with patient by telephone for follow up visit in response to provider referral for pharmacy case management and/or care coordination services.   Consent to Services:  The patient was given information about Chronic Care Management services, agreed to services, and gave verbal consent prior to initiation of services.  Please see initial visit note for detailed documentation.   Patient Care Team: [name], FNP as PCP - General ([place]) Skeet Latch, MD as PCP - Cardiology (Cardiology) Rossie Muskrat, MD as Referring Physician (Podiatry) Mayford Knife, [facility] (Pharmacist)  Recent office visits: 02/07/2021 PCP OV  Recent consult visits: 03/27/2021 Cardiology OV 03/08/2021 Cardiology OV 02/28/2021 [facility] visits: None in previous 6 months   Objective:  Lab Results  Component Value Date   CREATININE 0.79 08/18/2020   BUN 20 08/18/2020   GFR 90.57 08/03/2019   GFRNONAA 72 03/01/2020   GFRAA 83 03/01/2020   NA 139 08/18/2020   K 3.4 (L) 08/18/2020   CALCIUM 9.7 08/18/2020   CO2 28 08/18/2020   GLUCOSE 110 (H) 08/18/2020    Lab Results  Component Value Date/Time   HGBA1C 6.1 (H) 02/07/2021 12:17 PM   HGBA1C 6.3 (H) 08/18/2020 12:38 PM   GFR 90.57 08/03/2019 01:40 PM   GFR 66.53 05/27/2018  10:45 AM   MICROALBUR 30 08/18/2020 11:21 AM    Last diabetic Eye exam: No results found for: HMDIABEYEEXA  Last diabetic Foot exam: No results found for: HMDIABFOOTEX   Lab Results  Component Value Date   CHOL 228 (H) 02/07/2021   HDL 62 02/07/2021   LDLCALC 144 (H) 02/07/2021   TRIG 127 02/07/2021   CHOLHDL 3.7 02/07/2021    Hepatic Function Latest Ref Rng & Units 08/18/2020 08/03/2019 07/10/2018  Total Protein 6.0 - 8.5 g/dL 6.9 7.1 7.3  Albumin 3.7 - 4.7 g/dL 4.2 4.0 4.0  AST 0 - 40 IU/L _0 ALT 0 - 32 IU/L _1 Alk Phosphatase 44 - 121 IU/L 83 79 61  Total Bilirubin 0.0 - 1.2 mg/dL 0.5 0.5 0.6  Bilirubin, Direct 0.0 - 0.3 mg/dL - - -    Lab Results  Component Value Date/Time   TSH 1.22 08/03/2019 01:40 PM   TSH 1.21 05/27/2018 10:45 AM   FREET4 0.74 05/27/2018 10:45 AM    CBC Latest Ref Rng & Units 08/18/2020 02/15/2020 08/03/2019  WBC 3.4 - 10.8 x10E3/uL 6.7 5.7 6.7  Hemoglobin 11.1 - 15.9 g/dL 12.6 13.3 12.0  Hematocrit 34.0 - 46.6 % 38.4 39.2 36.1  Platelets 150 - 450 x10E3/uL 271 248.0 216.0    Lab Results  Component Value Date/Time   VD25OH 92.9 08/18/2020 12:38 PM    Clinical ASCVD: Yes  The 10-year ASCVD risk score (Arnett DK, et al., 2019) is: 31.4%   Values used to calculate the  score:     Age: 59 years     Sex: Female     Is Non-Hispanic African American: Yes     Diabetic: Yes     Tobacco smoker: No     Systolic Blood Pressure: 756 mmHg     Is BP treated: Yes     HDL Cholesterol: 62 mg/dL     Total Cholesterol: 228 mg/dL    Depression screen [facility] 2/9 05/18/2020 11/04/2019 07/12/2019  Decreased Interest 1 0 3  Down, Depressed, Hopeless 1 0 1  PHQ - 2 Score 2 0 4  Altered sleeping - - 0  Tired, decreased energy - - 3  Change in appetite - - 0  Feeling bad or failure about yourself  - - 0  Trouble concentrating - - 0  Moving slowly or fidgety/restless - - 0  Suicidal thoughts - - 0  PHQ-9 Score - - 7  Difficult doing work/chores - - -      Social History   Tobacco Use  Smoking Status Never  Smokeless Tobacco Never   BP Readings from Last 3 Encounters:  03/27/21 120/84  03/08/21 114/72  02/07/21 134/82   Pulse Readings from Last 3 Encounters:  03/27/21 73  03/08/21 77  02/07/21 72   Wt Readings from Last 3 Encounters:  03/27/21 146 lb 6.4 oz (66.4 kg)  03/08/21 147 lb 8 oz (66.9 kg)  02/07/21 148 lb (67.1 kg)   BMI Readings from Last 3 Encounters:  03/27/21 25.93 kg/m  03/08/21 26.13 kg/m  02/07/21 26.22 kg/m    Assessment/Interventions: Review of patient past medical history, allergies, medications, health status, including review of consultants reports, laboratory and other test data, was performed as part of comprehensive evaluation and provision of chronic care management services.   SDOH:  (Social Determinants of Health) assessments and interventions performed: No  SDOH Screenings   Alcohol Screen: Not on file  Depression (PHQ2-9): Low Risk    PHQ-2 Score: 2  Financial Resource Strain: Low Risk    Difficulty of Paying Living Expenses: Not hard at all  Food Insecurity: No Food Insecurity   Worried About Charity fundraiser in the Last Year: Never true   Ran Out of Food in the Last Year: Never true  Housing: Low Risk    Last Housing Risk Score: 0  Physical Activity: Inactive   Days of Exercise per Week: 0 days   Minutes of Exercise per Session: 0 min  Social Connections: Not on file  Stress: No Stress Concern Present   Feeling of Stress : Not at all  Tobacco Use: Low Risk    Smoking Tobacco Use: Never   Smokeless Tobacco Use: Never  Transportation Needs: No Transportation Needs   Lack of Transportation (Medical): No   Lack of Transportation (Non-Medical): No    CCM Care Plan  Allergies  Allergen Reactions   Atorvastatin     Myalgias    Atracurium & Derivatives Other (See Comments)    Unknown   Atropine Other (See Comments)    Heart racing   Iodine Other (See Comments)     Unknown   Shellfish Allergy Other (See Comments)    Unknown   Statins     "didn't feel right"     Medications Reviewed Today     Reviewed by Mayford Knife, [place] (Pharmacist) on 03/28/21 at 0857  Med List Status: <None>   Medication Order Taking? Sig Documenting Provider Last Dose Status Informant  ALPRAZolam (XANAX) 0.5  MG tablet [id number] No Take 0.5 tablets (0.25 mg total) by mouth daily as needed for anxiety. Briscoe Deutscher, DO Taking Active   aspirin 81 MG chewable tablet [id number] No Chew 81 mg by mouth daily. [provider] Taking Active Self  BIOTIN PO [id number] No Take 1 tablet by mouth daily.  [provider] Taking Active Self  calcium carbonate (OSCAL) 1500 (600 Ca) MG TABS tablet [id number] No Take 600 mg of elemental calcium by mouth daily. [provider] Taking Active Self  Cholecalciferol (VITAMIN D3) 25 MCG (1000 UT) CAPS [id number] No Take 1,000 Units by mouth daily. [provider] Taking Active Self  DEXILANT 60 MG capsule [id number] No TAKE 1 CAPSULE BY MOUTH  DAILY Mansouraty, Telford Nab., MD Taking Active   diltiazem (CARDIZEM) 30 MG tablet [id number] No TAKE 1 TABLET BY MOUTH  TWICE DAILY Skeet Latch, MD Taking Active   dorzolamide-timolol (COSOPT) 22.3-6.8 MG/ML ophthalmic solution [id number] No Apply 1 drop to eye in the morning and at bedtime. [provider] Taking Active   fluticasone (FLONASE) 50 MCG/ACT nasal spray [id number] No Place 2 sprays into both nostrils daily. Marshell Garfinkel, MD Taking Active            Med Note Pricilla Holm Dec 28, 2020 12:25 PM) Placing 1 drop in both eyes at bedtime and in the morning.   hydrochlorothiazide (HYDRODIURIL) 25 MG tablet [id number] No TAKE 1 TABLET BY MOUTH  DAILY [name], FNP Taking Active   latanoprost (XALATAN) 0.005 % ophthalmic solution [id number] No Place 1 drop into both eyes at bedtime.  [provider] Taking Active Self  Multiple  Vitamins-Minerals (CENTRUM SILVER 50+WOMEN PO) [id number] No Take 1 tablet by mouth daily. [provider] Taking Active Self  Omega-3 Fatty Acids (FISH OIL PO) [id number] No Take 1 capsule by mouth daily. [provider] Taking Active Self  polycarbophil (FIBERCON) 625 MG tablet [id number] No Take 625 mg by mouth daily. [provider] Taking Active Self  polyethylene glycol (MIRALAX / GLYCOLAX) packet [id number] No Take 17 g by mouth daily. [provider] Taking Active   Red Yeast Rice Extract (RED YEAST RICE PO) [id number] No Take 1 capsule by mouth daily. [provider] Taking Active   RHOPRESSA 0.02 % SOLN [id number] No Apply 1 drop to eye daily. [provider] Taking Active             Patient Active Problem List   Diagnosis Date Noted   Myopathies 03/28/2021   Body mass index (BMI) 26.0-26.9, adult 07/06/2020   Disc displacement, lumbar 07/06/2020   OSA on CPAP 01/06/2019   Abnormal CT of the abdomen 10/04/2018   Radiculopathy affecting upper extremity, left 07/25/2018   Hyperlipidemia 05/13/2018   Situational anxiety 04/29/2018   MVP (mitral valve prolapse) 04/29/2018   Sleep-disordered breathing 04/29/2018   Family History of Hyperthermia, malignant 04/23/2018   Barrett's esophagus 03/01/2018   Elevated lipase 02/18/2018   Hiatal hernia 02/18/2018   Constipation 02/18/2018   Bloating 02/18/2018   Aortic atherosclerosis ([place]) 01/23/2018   Gastroesophageal reflux disease without esophagitis    Glaucoma    Hypertension     Immunization History  Administered Date(s) Administered   Fluad Quad(high Dose 65+) 03/11/2019   Influenza, High Dose Seasonal PF 04/29/2018, 02/26/2021   Influenza-Unspecified 03/16/2020   PFIZER Comirnaty(Gray Top)Covid-19 Tri-Sucrose Vaccine 12/26/2020   PFIZER(Purple Top)SARS-COV-2 Vaccination 09/02/2019, 09/28/2019, 03/27/2020   Pneumococcal Conjugate-13 03/11/2019   Pneumococcal  Polysaccharide-23 06/09/2017  Tdap 02/10/2021    Conditions to be addressed/monitored:  Hypertension and Hyperlipidemia  Care Plan : [place]  Updates made by Mayford Knife, RPH since 04/03/2021 12:00 AM     Problem: HTN, Mixed Hyperlipidemia   Priority: High     Long-Range Goal: Disease Management   Recent Progress: On track  Priority: High  Note:   Current Barriers:  Unable to independently monitor therapeutic efficacy Unable to achieve control of Cholesterol    Pharmacist Clinical Goal(s):  Patient will achieve adherence to monitoring guidelines and medication adherence to achieve therapeutic efficacy through collaboration with PharmD and provider.   Interventions: 1:1 collaboration with [name], FNP regarding development and update of comprehensive plan of care as evidenced by provider attestation and co-signature Inter-disciplinary care team collaboration (see longitudinal plan of care) Comprehensive medication review performed; medication list updated in electronic medical record  Hypertension (BP goal <130/80) -Controlled -Current treatment: Cardizem 30 mg tablet twice per day Hydrochlorothiazide 25 mg taking 1 tablet by mouth daily -Current home readings: 114/66 HR: 90, 126/66 HR: 77 -Current dietary habits: she is not using any salt in her food. -Current exercise habits: she is going to the gym, and then her left leg started acting up, she went to a specialist and they said she has build her muscles. She has a stable bicycle that she uses at home while she gains strength. She does her exercise for 15-20 minutes and sometimes half an hour 7 days per week  -Denies hypotensive/hypertensive symptoms -Educated on BP goals and benefits of medications for prevention of heart attack, stroke and kidney damage; Exercise goal of 150 minutes per week; Importance of home blood pressure monitoring; Proper BP monitoring technique; -Counseled to monitor  BP at home at least once per week, document, and provide log at future appointments -Recommended to continue current medication  Hyperlipidemia: (LDL goal < 70) -Uncontrolled -Current treatment: Not currently taking any medication  -Medications previously tried: Atorvastatin 10 mg, Rosuvastatin- in [place] -Current dietary patterns: patient has cut back on fried and fatty foods.  -Current exercise habits: please see hypertension -Educated on Benefits of statin for ASCVD risk reduction; Importance of limiting foods high in cholesterol; -Patient reports talking to [place] about Repatha, and might be interested in using this option. -We discussed the benefits of the medication cutting hyperlipidemia, and prevention of cardiovascular events in patient with cardiovascular disease. -She also asked me about spacing out atorvastatin three times per week  -I assured the patient that she could reach out to either practice for reassurance and more information -Collaborate with PCP team and add G72.00 to patients profile for statin intolerance. -Patient is going to call [place], Gerald Stabs back to let him know that she is going to start the [place]. She is hopeful but worried.  -Recommended to continue current medication   Health Maintenance -Vaccine gaps: TDAP - [facility]/10/2021- [facility] [address] shingrix, influenza, received - 02/26/2021  -Reviewed [facility] and did not see patients immunizations listed [facility] and confirmed patient has received the following vaccines:    -Spoke with [facility] and confirmed patient received both vaccines on the dates the she said.   -Current therapy:  Biotin-taking 1 tablet by mouth daily Oscal 1500 mg tablet once per day  Omega 3 fatty acids - take 1 capsule by mouth daily -Educated on Cost vs benefit of each product must be carefully weighed by individual consumer -Patient is satisfied with current therapy and denies issues -Recommended  to continue current  medication  Patient Goals/Self-Care Activities Patient will:  - take medications as prescribed  Follow Up Plan: The patient has been provided with contact information for the care management team and has been advised to call with any health related questions or concerns.       Medication Assistance: None required.  Patient affirms current coverage meets needs.  Compliance/Adherence/Medication fill history: Care Gaps: Shingrix Vaccine  Star-Rating Drugs: None noted   Patient's [facility] pharmacy [facility]  [id number]Phone: [phone]: [phone] [facility]  ([facility] [facility]Phone: [phone]: [phone] [facility] ([facility]) - [place] [address] [place] [address]Phone: [phone]: [phone] Uses pill box? Yes Pt endorses 90% compliance  We discussed: Benefits of medication synchronization, packaging and delivery as well as enhanced pharmacist oversight with [employer]. Patient decided to: Continue current medication management strategy  Care Plan and Follow Up Patient Decision:  Patient agrees to Care Plan and Follow-up.  Plan: The patient has been provided with contact information for the care management team and has been advised to call with any health related questions or concerns.   VOrlando Penner PharmD Clinical Pharmacist [facility] [phone]

## 2021-03-28 NOTE — Patient Instructions (Addendum)
Visit Information It was great speaking with you today!  Please let me know if you have any questions about our visit.   Goals Addressed             This Visit's Progress    Manage My Medicine       Timeframe:  Long-Range Goal Priority:  High Start Date:                             Expected End Date:                       Follow Up Date:05/03/2021   In Progress:  - call for medicine refill 2 or 3 days before it runs out - call if I am sick and can't take my medicine - keep a list of all the medicines I take; vitamins and herbals too - use a pillbox to sort medicine    Why is this important?   These steps will help you keep on track with your medicines.  Notes:  -Please let us know if you have any questions or concerns           Patient Care Plan: CCM Pharmacy Care Plan     Problem Identified: HTN, Mixed Hyperlipidemia   Priority: High     Long-Range Goal: Disease Management   Recent Progress: On track  Priority: High  Note:   Current Barriers:  Unable to independently monitor therapeutic efficacy Unable to achieve control of Cholesterol    Pharmacist Clinical Goal(s):  Patient will achieve adherence to monitoring guidelines and medication adherence to achieve therapeutic efficacy through collaboration with PharmD and provider.   Interventions: 1:1 collaboration with [name], FNP regarding development and update of comprehensive plan of care as evidenced by provider attestation and co-signature Inter-disciplinary care team collaboration (see longitudinal plan of care) Comprehensive medication review performed; medication list updated in electronic medical record  Hypertension (BP goal <130/80) -Controlled -Current treatment: Cardizem 30 mg tablet twice per day Hydrochlorothiazide 25 mg taking 1 tablet by mouth daily -Current home readings: 114/66 HR: 90, 126/66 HR: 77 -Current dietary habits: she is not using any salt in her food. -Current  exercise habits: she is going to the gym, and then her left leg started acting up, she went to a specialist and they said she has build her muscles. She has a stable bicycle that she uses at home while she gains strength. She does her exercise for 15-20 minutes and sometimes half an hour 7 days per week  -Denies hypotensive/hypertensive symptoms -Educated on BP goals and benefits of medications for prevention of heart attack, stroke and kidney damage; Exercise goal of 150 minutes per week; Importance of home blood pressure monitoring; Proper BP monitoring technique; -Counseled to monitor BP at home at least once per week, document, and provide log at future appointments -Recommended to continue current medication  Hyperlipidemia: (LDL goal < 70) -Uncontrolled -Current treatment: Not currently taking any medication  -Medications previously tried: Atorvastatin 10 mg, Rosuvastatin- in [place] -Current dietary patterns: patient has cut back on fried and fatty foods.  -Current exercise habits: please see hypertension -Educated on Benefits of statin for ASCVD risk reduction; Importance of limiting foods high in cholesterol; -Patient reports talking to [place] about Repatha, and might be interested in using this option. -We discussed the benefits of the medication cutting hyperlipidemia, and prevention of cardiovascular events in patient with cardiovascular  disease. -She also asked me about spacing out atorvastatin three times per week  -I assured the patient that she could reach out to either practice for reassurance and more information -Collaborate with PCP team and add G72.00 to patients profile for statin intolerance. -Patient is going to call [place], Gerald Stabs back to let him know that she is going to start the [place].  -Recommended to continue current medication   Health Maintenance -Vaccine gaps: TDAP - [facility]/2021- [facility] [address] [facility], influenza, received -  02/26/2021 -Current therapy:  Biotin-taking 1 tablet by mouth daily Oscal 1500 mg tablet once per day  Omega 3 fatty acids - take 1 capsule by mouth daily -Educated on Cost vs benefit of each product must be carefully weighed by individual consumer -Patient is satisfied with current therapy and denies issues -Recommended to continue current medication  Patient Goals/Self-Care Activities Patient will:  - take medications as prescribed  Follow Up Plan: The patient has been provided with contact information for the care management team and has been advised to call with any health related questions or concerns.        Patient agreed to services and verbal consent obtained.   The patient verbalized understanding of instructions, educational materials, and care plan provided today and agreed to receive a mailed copy of patient instructions, educational materials, and care plan.   [name], PharmD Clinical Pharmacist [facility] [phone]

## 2021-03-29 ENCOUNTER — Telehealth: Payer: Self-pay

## 2021-03-29 ENCOUNTER — Other Ambulatory Visit: Payer: Self-pay | Admitting: Nurse Practitioner

## 2021-03-29 NOTE — Telephone Encounter (Signed)
Patient called and has a question regarding her LDL.  Please call to discuss.  Thank you

## 2021-03-30 NOTE — Telephone Encounter (Signed)
LMOM

## 2021-03-31 ENCOUNTER — Other Ambulatory Visit: Payer: Self-pay | Admitting: Nurse Practitioner

## 2021-04-06 ENCOUNTER — Telehealth: Payer: Self-pay | Admitting: Pharmacist

## 2021-04-06 DIAGNOSIS — E785 Hyperlipidemia, unspecified: Secondary | ICD-10-CM

## 2021-04-06 NOTE — Telephone Encounter (Signed)
Received VM asking for call back.  Called patient.  No answer, left message on machine

## 2021-04-12 ENCOUNTER — Encounter: Payer: Self-pay | Admitting: Nurse Practitioner

## 2021-04-13 ENCOUNTER — Other Ambulatory Visit: Payer: Self-pay

## 2021-04-13 ENCOUNTER — Ambulatory Visit: Payer: Medicare Other | Admitting: Pulmonary Disease

## 2021-04-13 ENCOUNTER — Encounter: Payer: Self-pay | Admitting: Pulmonary Disease

## 2021-04-13 VITALS — BP 118/72 | HR 87 | Ht 63.0 in | Wt 148.2 lb

## 2021-04-13 DIAGNOSIS — R053 Chronic cough: Secondary | ICD-10-CM | POA: Diagnosis not present

## 2021-04-13 DIAGNOSIS — J849 Interstitial pulmonary disease, unspecified: Secondary | ICD-10-CM

## 2021-04-13 NOTE — Progress Notes (Signed)
Terri Barajas    [id number]    [birth year]  Primary Care Physician:Moore, Doreene Burke, [place]  Referring Physician: [name], [place] [address] [place],  [place] [address]  Chief complaint: Follow-up for chronic cough, post COVID-97  HPI: 75 year old with history of hypertension, sleep apnea [follows with Dr. Dohmeir] Referred for evaluation of chronic cough She was diagnosed with COVID-19 in January 2021.  Did not require hospitalization and was treated with monoclonal antibody Has developed chronic nonproductive cough since then.  Not associated with dyspnea, wheezing, sputum production. Complains of irritation at the back of the throat and feeling of mucus stuck..  Show significant acid reflux and is treated with Dexilant by Dr. Rush [facility].  Also has sleep apnea for which she is on CPAP by Dr. Anastasia Pall.   Pets: No pets Occupation: Used to work in Therapist, art at [employer], clinic. Exposures: No known exposures.  No mold, hot tub, Jacuzzi.  No feather pillows or comforters Smoking history: Never smoker Travel history: Previously lived in [place] Relevant family history: No significant family issue of lung disease  Interim history: Here for review of CT scan States that she is doing well with mild cough which is unchanged Denies any dyspnea  Outpatient Encounter Medications as of 04/13/2021  Medication Sig   ALPRAZolam (XANAX) 0.5 MG tablet Take 0.5 tablets (0.25 mg total) by mouth daily as needed for anxiety.   aspirin 81 MG chewable tablet Chew 81 mg by mouth daily.   BIOTIN PO Take 1 tablet by mouth daily.    calcium carbonate (OSCAL) 1500 (600 Ca) MG TABS tablet Take 600 mg of elemental calcium by mouth daily.   Cholecalciferol (VITAMIN D3) 25 MCG (1000 UT) CAPS Take 1,000 Units by mouth daily.   DEXILANT 60 MG capsule TAKE 1 CAPSULE BY MOUTH  DAILY   diltiazem (CARDIZEM) 30 MG tablet TAKE 1 TABLET BY MOUTH  TWICE DAILY    dorzolamide-timolol (COSOPT) 22.3-6.8 MG/ML ophthalmic solution Apply 1 drop to eye in the morning and at bedtime.   fluticasone (FLONASE) 50 MCG/ACT nasal spray Place 2 sprays into both nostrils daily.   hydrochlorothiazide (HYDRODIURIL) 25 MG tablet TAKE 1 TABLET BY MOUTH  DAILY   latanoprost (XALATAN) 0.005 % ophthalmic solution Place 1 drop into both eyes at bedtime.    Multiple Vitamins-Minerals (CENTRUM SILVER 50+WOMEN PO) Take 1 tablet by mouth daily.   Omega-3 Fatty Acids (FISH OIL PO) Take 1 capsule by mouth daily.   polycarbophil (FIBERCON) 625 MG tablet Take 625 mg by mouth daily.   polyethylene glycol (MIRALAX / GLYCOLAX) packet Take 17 g by mouth daily.   Red Yeast Rice Extract (RED YEAST RICE PO) Take 1 capsule by mouth daily.   RHOPRESSA 0.02 % SOLN Apply 1 drop to eye daily.   No facility-administered encounter medications on file as of 04/13/2021.    Physical Exam: Blood pressure 118/72, pulse 87, height 5\' 3"  (1.6 m), weight 148 lb 3.2 oz (67.2 kg), SpO2 100 %. Gen:      No acute distress HEENT:  EOMI, sclera anicteric Neck:     No masses; no thyromegaly Lungs:    Clear to auscultation bilaterally; normal respiratory effort CV:         Regular rate and rhythm; no murmurs Abd:      + bowel sounds; soft, non-tender; no palpable masses, no distension Ext:    No edema; adequate peripheral perfusion Skin:  Warm and dry; no rash Neuro: alert and oriented x 3 Psych: normal mood and affect   Data Reviewed: Imaging: CT abdomen pelvis 01/22/2018-visualized lung bases are normal Chest x-ray 07/04/2019-basal interstitial prominence High-res CT chest 03/07/2021-mild subpleural and basilar reticulation stable compared to September 2021 I have reviewed images personally.  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  Follows with neurology.  Plan/Recommendations: PFTs   Marshell Garfinkel MD [facility] 04/13/2021, 11:03 AM  CC: [name], FNP

## 2021-04-13 NOTE — Patient Instructions (Signed)
CT shows stable mild changes from prior covid infection. There are no concerning findings.  Will order pulmonary function test at next available Follow-up in clinic in 6 months

## 2021-04-13 NOTE — Telephone Encounter (Signed)
Patient is returning call.  °

## 2021-04-13 NOTE — Addendum Note (Signed)
Addended by: Lorretta Harp on: 04/13/2021 11:25 AM   Modules accepted: Orders

## 2021-04-16 ENCOUNTER — Other Ambulatory Visit: Payer: Self-pay

## 2021-04-16 ENCOUNTER — Ambulatory Visit (INDEPENDENT_AMBULATORY_CARE_PROVIDER_SITE_OTHER): Payer: Medicare Other | Admitting: Pulmonary Disease

## 2021-04-16 DIAGNOSIS — J849 Interstitial pulmonary disease, unspecified: Secondary | ICD-10-CM

## 2021-04-16 DIAGNOSIS — E782 Mixed hyperlipidemia: Secondary | ICD-10-CM | POA: Diagnosis not present

## 2021-04-16 DIAGNOSIS — I10 Essential (primary) hypertension: Secondary | ICD-10-CM

## 2021-04-16 DIAGNOSIS — R053 Chronic cough: Secondary | ICD-10-CM

## 2021-04-16 LAB — PULMONARY FUNCTION TEST
DL/VA % pred: 116 %
DL/VA: 4.83 ml/min/mmHg/L
DLCO cor % pred: 83 %
DLCO cor: 15.51 ml/min/mmHg
DLCO unc % pred: 83 %
DLCO unc: 15.51 ml/min/mmHg
FEF 25-75 Post: 1.95 L/sec
FEF 25-75 Pre: 2.15 L/sec
FEF2575-%Change-Post: -9 %
FEF2575-%Pred-Post: 128 %
FEF2575-%Pred-Pre: 142 %
FEV1-%Change-Post: -4 %
FEV1-%Pred-Post: 99 %
FEV1-%Pred-Pre: 104 %
FEV1-Post: 1.65 L
FEV1-Pre: 1.73 L
FEV1FVC-%Change-Post: -1 %
FEV1FVC-%Pred-Pre: 112 %
FEV6-%Change-Post: -2 %
FEV6-%Pred-Post: 94 %
FEV6-%Pred-Pre: 97 %
FEV6-Post: 1.95 L
FEV6-Pre: 2.01 L
FEV6FVC-%Pred-Post: 104 %
FEV6FVC-%Pred-Pre: 104 %
FVC-%Change-Post: -2 %
FVC-%Pred-Post: 90 %
FVC-%Pred-Pre: 93 %
FVC-Post: 1.95 L
FVC-Pre: 2.01 L
Post FEV1/FVC ratio: 85 %
Post FEV6/FVC ratio: 100 %
Pre FEV1/FVC ratio: 86 %
Pre FEV6/FVC Ratio: 100 %
RV % pred: 88 %
RV: 1.95 L
TLC % pred: 82 %
TLC: 4.05 L

## 2021-04-16 NOTE — Progress Notes (Signed)
PFT done today. 

## 2021-04-21 ENCOUNTER — Other Ambulatory Visit: Payer: Self-pay | Admitting: Nurse Practitioner

## 2021-04-25 ENCOUNTER — Encounter: Payer: Self-pay | Admitting: Nurse Practitioner

## 2021-04-25 DIAGNOSIS — G4733 Obstructive sleep apnea (adult) (pediatric): Secondary | ICD-10-CM | POA: Diagnosis not present

## 2021-04-26 ENCOUNTER — Ambulatory Visit (INDEPENDENT_AMBULATORY_CARE_PROVIDER_SITE_OTHER): Payer: Medicare Other

## 2021-04-26 VITALS — Ht 63.0 in | Wt 148.0 lb

## 2021-04-26 DIAGNOSIS — Z0000 Encounter for general adult medical examination without abnormal findings: Secondary | ICD-10-CM

## 2021-04-26 NOTE — Patient Instructions (Signed)
Terri Barajas , Thank you for taking time to come for your Medicare Wellness Visit. I appreciate your ongoing commitment to your health goals. Please review the following plan we discussed and let me know if I can assist you in the future.   Screening recommendations/referrals: Colonoscopy: completed 03/30/2015 Mammogram: completed 03/02/2021 Bone Density: completed 02/09/2020 Recommended yearly ophthalmology/optometry visit for glaucoma screening and checkup Recommended yearly dental visit for hygiene and checkup  Vaccinations: Influenza vaccine: completed 02/26/2021 Pneumococcal vaccine: completed 03/11/2019 Tdap vaccine: completed 02/10/2021, due 02/11/2031 Shingles vaccine: completed per patient   Covid-19: 04/14/2021, 12/26/2020, 03/27/2020, 09/28/2019, 09/02/2019  Advanced directives: Please bring a copy of your POA (Power of Attorney) and/or Living Will to your next appointment.   Conditions/risks identified: none  Next appointment: Follow up in one year for your annual wellness visit    Preventive Care 65 Years and Older, Female Preventive care refers to lifestyle choices and visits with your health care provider that can promote health and wellness. What does preventive care include? A yearly physical exam. This is also called an annual well check. Dental exams once or twice a year. Routine eye exams. Ask your health care provider how often you should have your eyes checked. Personal lifestyle choices, including: Daily care of your teeth and gums. Regular physical activity. Eating a healthy diet. Avoiding tobacco and drug use. Limiting alcohol use. Practicing safe sex. Taking low-dose aspirin every day. Taking vitamin and mineral supplements as recommended by your health care provider. What happens during an annual well check? The services and screenings done by your health care provider during your annual well check will depend on your age, overall health, lifestyle risk  factors, and family history of disease. Counseling  Your health care provider may ask you questions about your: Alcohol use. Tobacco use. Drug use. Emotional well-being. Home and relationship well-being. Sexual activity. Eating habits. History of falls. Memory and ability to understand (cognition). Work and work Statistician. Reproductive health. Screening  You may have the following tests or measurements: Height, weight, and BMI. Blood pressure. Lipid and cholesterol levels. These may be checked every 5 years, or more frequently if you are over 33 years old. Skin check. Lung cancer screening. You may have this screening every year starting at age 41 if you have a 30-pack-year history of smoking and currently smoke or have quit within the past 15 years. Fecal occult blood test (FOBT) of the stool. You may have this test every year starting at age 70. Flexible sigmoidoscopy or colonoscopy. You may have a sigmoidoscopy every 5 years or a colonoscopy every 10 years starting at age 61. Hepatitis C blood test. Hepatitis B blood test. Sexually transmitted disease (STD) testing. Diabetes screening. This is done by checking your blood sugar (glucose) after you have not eaten for a while (fasting). You may have this done every 1-3 years. Bone density scan. This is done to screen for osteoporosis. You may have this done starting at age 27. Mammogram. This may be done every 1-2 years. Talk to your health care provider about how often you should have regular mammograms. Talk with your health care provider about your test results, treatment options, and if necessary, the need for more tests. Vaccines  Your health care provider may recommend certain vaccines, such as: Influenza vaccine. This is recommended every year. Tetanus, diphtheria, and acellular pertussis (Tdap, Td) vaccine. You may need a Td booster every 10 years. Zoster vaccine. You may need this after age 110. Pneumococcal 13-valent  conjugate (PCV13) vaccine. One dose is recommended after age 40. Pneumococcal polysaccharide (PPSV23) vaccine. One dose is recommended after age 37. Talk to your health care provider about which screenings and vaccines you need and how often you need them. This information is not intended to replace advice given to you by your health care provider. Make sure you discuss any questions you have with your health care provider. Document Released: 06/30/2015 Document Revised: 02/21/2016 Document Reviewed: 04/04/2015 Elsevier Interactive Patient Education  2017 [place] Prevention in the Home Falls can cause injuries. They can happen to people of all ages. There are many things you can do to make your home safe and to help prevent falls. What can I do on the outside of my home? Regularly fix the edges of walkways and driveways and fix any cracks. Remove anything that might make you trip as you walk through a door, such as a raised step or threshold. Trim any bushes or trees on the path to your home. Use bright outdoor lighting. Clear any walking paths of anything that might make someone trip, such as rocks or tools. Regularly check to see if handrails are loose or broken. Make sure that both sides of any steps have handrails. Any raised decks and porches should have guardrails on the edges. Have any leaves, snow, or ice cleared regularly. Use sand or salt on walking paths during winter. Clean up any spills in your garage right away. This includes oil or grease spills. What can I do in the bathroom? Use night lights. Install grab bars by the toilet and in the tub and shower. Do not use towel bars as grab bars. Use non-skid mats or decals in the tub or shower. If you need to sit down in the shower, use a plastic, non-slip stool. Keep the floor dry. Clean up any water that spills on the floor as soon as it happens. Remove soap buildup in the tub or shower regularly. Attach bath mats  securely with double-sided non-slip rug tape. Do not have throw rugs and other things on the floor that can make you trip. What can I do in the bedroom? Use night lights. Make sure that you have a light by your bed that is easy to reach. Do not use any sheets or blankets that are too big for your bed. They should not hang down onto the floor. Have a firm chair that has side arms. You can use this for support while you get dressed. Do not have throw rugs and other things on the floor that can make you trip. What can I do in the kitchen? Clean up any spills right away. Avoid walking on wet floors. Keep items that you use a lot in easy-to-reach places. If you need to reach something above you, use a strong step stool that has a grab bar. Keep electrical cords out of the way. Do not use floor polish or wax that makes floors slippery. If you must use wax, use non-skid floor wax. Do not have throw rugs and other things on the floor that can make you trip. What can I do with my stairs? Do not leave any items on the stairs. Make sure that there are handrails on both sides of the stairs and use them. Fix handrails that are broken or loose. Make sure that handrails are as long as the stairways. Check any carpeting to make sure that it is firmly attached to the stairs. Fix any carpet that is  loose or worn. Avoid having throw rugs at the top or bottom of the stairs. If you do have throw rugs, attach them to the floor with carpet tape. Make sure that you have a light switch at the top of the stairs and the bottom of the stairs. If you do not have them, ask someone to add them for you. What else can I do to help prevent falls? Wear shoes that: Do not have high heels. Have rubber bottoms. Are comfortable and fit you well. Are closed at the toe. Do not wear sandals. If you use a stepladder: Make sure that it is fully opened. Do not climb a closed stepladder. Make sure that both sides of the stepladder  are locked into place. Ask someone to hold it for you, if possible. Clearly mark and make sure that you can see: Any grab bars or handrails. First and last steps. Where the edge of each step is. Use tools that help you move around (mobility aids) if they are needed. These include: Canes. Walkers. Scooters. Crutches. Turn on the lights when you go into a dark area. Replace any light bulbs as soon as they burn out. Set up your furniture so you have a clear path. Avoid moving your furniture around. If any of your floors are uneven, fix them. If there are any pets around you, be aware of where they are. Review your medicines with your doctor. Some medicines can make you feel dizzy. This can increase your chance of falling. Ask your doctor what other things that you can do to help prevent falls. This information is not intended to replace advice given to you by your health care provider. Make sure you discuss any questions you have with your health care provider. Document Released: 03/30/2009 Document Revised: 11/09/2015 Document Reviewed: 07/08/2014 Elsevier Interactive Patient Education  2017 [employer].

## 2021-04-26 NOTE — Progress Notes (Addendum)
I connected with  Avelina Laine Presas today via telehealth video enabled device and verified that I am speaking with the correct person using two identifiers.   Location: Patient: home Provider: work  Persons participating in virtual visit: Jawana, Reagor LPN  I discussed the limitations, risks, security and privacy concerns of performing an evaluation and management service by video and the availability of in person appointments. The patient expressed understanding and agreed to proceed.   Some vital signs may be absent or patient reported.   . Subjective:   Terri Barajas is a 75 y.o. female who presents for Medicare Annual (Subsequent) preventive examination.  Review of Systems     Cardiac Risk Factors include: advanced age (>65men, >46 women);dyslipidemia     Objective:    Today's Vitals   04/26/21 0848 04/26/21 0849  Weight: 148 lb (67.1 kg)   Height: 5\' 3"  (1.6 m)   PainSc:  5    Body mass index is 26.22 kg/m.  Advanced Directives 04/26/2021 05/18/2020 09/28/2019 07/16/2018 07/06/2018  Does Patient Have a Medical Advance Directive? Yes Yes No No No  Type of Paramedic of [place];Living will [place];Living will - - -  Copy of [place] in Chart? No - copy requested - - - -  Would patient like information on creating a medical advance directive? - - - No - Patient declined No - Patient declined    Current Medications (verified) Outpatient Encounter Medications as of 04/26/2021  Medication Sig   ALPRAZolam (XANAX) 0.5 MG tablet Take 0.5 tablets (0.25 mg total) by mouth daily as needed for anxiety.   aspirin 81 MG chewable tablet Chew 81 mg by mouth daily.   BIOTIN PO Take 1 tablet by mouth daily.    calcium carbonate (OSCAL) 1500 (600 Ca) MG TABS tablet Take 600 mg of elemental calcium by mouth daily.   Cholecalciferol (VITAMIN D3) 25 MCG (1000 UT) CAPS Take 1,000 Units by mouth daily.    DEXILANT 60 MG capsule TAKE 1 CAPSULE BY MOUTH  DAILY   diltiazem (CARDIZEM) 30 MG tablet TAKE 1 TABLET BY MOUTH  TWICE DAILY   dorzolamide-timolol (COSOPT) 22.3-6.8 MG/ML ophthalmic solution Apply 1 drop to eye in the morning and at bedtime.   fluticasone (FLONASE) 50 MCG/ACT nasal spray Place 2 sprays into both nostrils daily.   hydrochlorothiazide (HYDRODIURIL) 25 MG tablet TAKE 1 TABLET BY MOUTH  DAILY   latanoprost (XALATAN) 0.005 % ophthalmic solution Place 1 drop into both eyes at bedtime.    Multiple Vitamins-Minerals (CENTRUM SILVER 50+WOMEN PO) Take 1 tablet by mouth daily.   Omega-3 Fatty Acids (FISH OIL PO) Take 1 capsule by mouth daily.   polycarbophil (FIBERCON) 625 MG tablet Take 625 mg by mouth daily.   polyethylene glycol (MIRALAX / GLYCOLAX) packet Take 17 g by mouth daily.   Red Yeast Rice Extract (RED YEAST RICE PO) Take 1 capsule by mouth daily.   RHOPRESSA 0.02 % SOLN Apply 1 drop to eye daily.   No facility-administered encounter medications on file as of 04/26/2021.    Allergies (verified) Atorvastatin, Atracurium & derivatives, Atropine, Iodine, Shellfish allergy, and Statins   History: Past Medical History:  Diagnosis Date   Anxiety    Arthritis    Complication of anesthesia    some type of sedation medication made her feel crazy   Dysrhythmia    Family history of adverse reaction to anesthesia    daughter has Malignant  Hyperthermia hx -    GERD (gastroesophageal reflux disease)    Glaucoma    H/O: hysterectomy 1978   Heart disease    History of hiatal hernia    Hypertension    MVP (mitral valve prolapse)    Panic attacks    Pre-diabetes    Sciatica    Past Surgical History:  Procedure Laterality Date   APPENDECTOMY  1972   BACK SURGERY  07/14/2020   chipped disc   BIOPSY  07/06/2018   Procedure: BIOPSY;  Surgeon: Irving Copas., MD;  Location: [place];  Service: Gastroenterology;;   CATARACT EXTRACTION  2013   [place] and 1978   COLONOSCOPY     ESOPHAGOGASTRODUODENOSCOPY (EGD) WITH PROPOFOL N/A 07/06/2018   Procedure: ESOPHAGOGASTRODUODENOSCOPY (EGD) WITH PROPOFOL;  Surgeon: Irving Copas., MD;  Location: [place];  Service: Gastroenterology;  Laterality: N/A;   HAND SURGERY Bilateral    HEMORRHOID SURGERY  2018   S/P Hysterectomy   1987   Family History  Problem Relation Age of Onset   Pancreatic cancer Mother    Alcohol abuse Father    Asthma Sister    Hypertension Sister    Arthritis Sister    Diabetes Brother    Arthritis Brother    Hyperlipidemia Brother    Hypertension Brother    Glaucoma Brother    Colon polyps Brother    Arthritis Sister    Arthritis Sister    Prostate cancer Brother    Congenital heart disease Brother    Colon cancer Neg Hx    Esophageal cancer Neg Hx    Inflammatory bowel disease Neg Hx    Liver disease Neg Hx    Rectal cancer Neg Hx    Stomach cancer Neg Hx    Social History   Socioeconomic History   Marital status: Married    Spouse name: Not on file   Number of children: Not on file   Years of education: 12   Highest education level: Not on file  Occupational History   Occupation: retired  Tobacco Use   Smoking status: Never   Smokeless tobacco: Never  Vaping Use   Vaping Use: Never used  Substance and Sexual Activity   Alcohol use: Not Currently   Drug use: Not Currently   Sexual activity: Yes    Partners: Male  Other Topics Concern   Not on file  Social History Narrative   Not on file   Social Determinants of Health   Financial Resource Strain: Low Risk    Difficulty of Paying Living Expenses: Not hard at all  Food Insecurity: No Food Insecurity   Worried About Charity fundraiser in the Last Year: Never true   [place] in the Last Year: Never true  Transportation Needs: No Transportation Needs   Lack of Transportation (Medical): No   Lack of Transportation (Non-Medical): No  Physical Activity:  Sufficiently Active   Days of Exercise per Week: 7 days   Minutes of Exercise per Session: 30 min  Stress: No Stress Concern Present   Feeling of Stress : Not at all  Social Connections: Not on file    Tobacco Counseling Counseling given: Not Answered   Clinical Intake:  Pre-visit preparation completed: Yes  Pain : 0-10 Pain Score: 5  Pain Type: Chronic pain Pain Location: Leg Pain Orientation: Left Pain Descriptors / Indicators: Aching Pain Onset: More than a month ago Pain Frequency: Constant  Nutritional Status: BMI 25 -29 Overweight Nutritional Risks: None Diabetes: No  How often do you need to have someone help you when you read instructions, pamphlets, or other written materials from your doctor or pharmacy?: 1 - Never What is the last grade level you completed in school?: 12th grade  Diabetic? no  Interpreter Needed?: No  Information entered by :: NAllen LPN   Activities of Daily Living In your present state of health, do you have any difficulty performing the following activities: 04/26/2021 05/18/2020  Hearing? N N  Vision? Y N  Comment a little blurry -  Difficulty concentrating or making decisions? N N  Walking or climbing stairs? N Y  Comment - due to leg pain  Dressing or bathing? N N  Doing errands, shopping? N N  Preparing Food and eating ? N N  Using the Toilet? N N  In the past six months, have you accidently leaked urine? Y Y  Comment with some sneezing with sneezing or cough  Do you have problems with loss of bowel control? N N  Managing your Medications? N N  Managing your Finances? N N  Housekeeping or managing your Housekeeping? N N  Some recent data might be hidden    Patient Care Team: [name], FNP as PCP - General ([place]) Skeet Latch, MD as PCP - Cardiology (Cardiology) Rossie Muskrat, MD as Referring Physician (Podiatry) Mayford Knife, [facility] (Pharmacist)  Indicate any recent Medical Services you  may have received from other than [facility] providers in the past year (date may be approximate).     Assessment:   This is a routine wellness examination for Agency.  Hearing/Vision screen Vision Screening - Comments:: Regular eye exams, Dr. Genevie Ann  Dietary issues and exercise activities discussed: Current Exercise Habits: Home exercise routine, Type of exercise: Other - see comments (stationary bike), Time (Minutes): 30, Frequency (Times/Week): 7, Weekly Exercise (Minutes/Week): 210   Goals Addressed             This Visit's Progress    Patient Stated       04/26/2021, wants to weigh 140 pounds       Depression Screen PHQ 2/9 Scores 04/26/2021 05/18/2020 11/04/2019 07/12/2019 11/23/2018 07/21/2018 07/16/2018  PHQ - 2 Score 0 2 0 4 0 4 0  PHQ- 9 Score - - - 7 0 6 -    Fall Risk Fall Risk  04/26/2021 05/18/2020 11/04/2019 11/23/2018 01/14/2018  Falls in the past year? 0 0 0 0 No  Number falls in past yr: - 0 - 0 -  Injury with Fall? - - - 0 -  Risk for fall due to : Medication side effect Impaired mobility;Impaired balance/gait;Medication side effect - - -  Follow up Falls evaluation completed;Education provided;Falls prevention discussed Falls evaluation completed;Education provided;Falls prevention discussed - - -    FALL RISK PREVENTION PERTAINING TO THE HOME:  Any stairs in or around the home? Yes  If so, are there any without handrails? No  Home free of loose throw rugs in walkways, pet beds, electrical cords, etc? Yes  Adequate lighting in your home to reduce risk of falls? Yes   ASSISTIVE DEVICES UTILIZED TO PREVENT FALLS:  Life alert? No  Use of a cane, walker or w/c? No  Grab bars in the bathroom? No  Shower chair or bench in shower? No  Elevated toilet seat or a handicapped toilet? Yes   TIMED UP AND GO:  Was the test performed? No .  Cognitive Function:     6CIT Screen 04/26/2021 05/18/2020  What Year? 0 points 0 points  What month? 0 points 0 points   What time? 0 points 0 points  Count back from 20 0 points 0 points  Months in reverse 4 points 0 points  Repeat phrase 0 points 4 points  Total Score 4 4    Immunizations Immunization History  Administered Date(s) Administered   Fluad Quad(high Dose 65+) 03/11/2019   Influenza, High Dose Seasonal PF 04/29/2018, 02/26/2021   Influenza-Unspecified 03/16/2020   PFIZER Comirnaty(Gray Top)Covid-19 Tri-Sucrose Vaccine 12/26/2020   PFIZER(Purple Top)SARS-COV-2 Vaccination 09/02/2019, 09/28/2019, 03/27/2020, 04/14/2021   Pfizer Covid-19 Vaccine Bivalent Booster 61yrs & up 04/14/2021   Pneumococcal Conjugate-13 03/11/2019   Pneumococcal Polysaccharide-23 06/09/2017   Tdap 02/10/2021    TDAP status: Up to date  Flu Vaccine status: Up to date  Pneumococcal vaccine status: Up to date  Covid-19 vaccine status: Completed vaccines  Qualifies for Shingles Vaccine? Yes   Zostavax completed Yes   Shingrix Completed?: Yes, per patient  Screening Tests Health Maintenance  Topic Date Due   Zoster Vaccines- Shingrix (1 of 2) Never done   URINE MICROALBUMIN  08/18/2021   MAMMOGRAM  03/03/2023   COLONOSCOPY (Pts 45-68yrs Insurance coverage will need to be confirmed)  03/29/2025   TETANUS/TDAP  02/11/2031   Pneumonia Vaccine 7+ Years old  Completed   INFLUENZA VACCINE  Completed   DEXA SCAN  Completed   COVID-19 Vaccine  Completed   Hepatitis C Screening  Completed   HPV VACCINES  Aged Out    Health Maintenance  Health Maintenance Due  Topic Date Due   Zoster Vaccines- Shingrix (1 of 2) Never done    Colorectal cancer screening: Type of screening: Colonoscopy. Completed 03/30/2015. Repeat every 10 years  Mammogram status: Completed 03/02/2021. Repeat every year  Bone Density status: Completed 02/09/2020.   Lung Cancer Screening: (Low Dose CT Chest recommended if Age 32-80 years, 30 pack-year currently smoking OR have quit w/in 15years.) does not qualify.   Lung Cancer  Screening Referral: no  Additional Screening:  Hepatitis C Screening: does qualify; Completed 05/27/2018  Vision Screening: Recommended annual ophthalmology exams for early detection of glaucoma and other disorders of the eye. Is the patient up to date with their annual eye exam?  Yes  Who is the provider or what is the name of the office in which the patient attends annual eye exams? Dr. Genevie Ann If pt is not established with a provider, would they like to be referred to a provider to establish care? No .   Dental Screening: Recommended annual dental exams for proper oral hygiene  Community Resource Referral / Chronic Care Management: CRR required this visit?  No   CCM required this visit?  No      Plan:     I have personally reviewed and noted the following in the patient's chart:   Medical and social history Use of alcohol, tobacco or illicit drugs  Current medications and supplements including opioid prescriptions.  Functional ability and status Nutritional status Physical activity Advanced directives List of other physicians Hospitalizations, surgeries, and ER visits in previous 12 months Vitals Screenings to include cognitive, depression, and falls Referrals and appointments  In addition, I have reviewed and discussed with patient certain preventive protocols, quality metrics, and best practice recommendations. A written personalized care plan for preventive services as well as general preventive health recommendations were provided to patient.     Kellie Simmering,  LPN   [id number]   Nurse Notes: none

## 2021-05-02 ENCOUNTER — Telehealth: Payer: Self-pay

## 2021-05-02 NOTE — Progress Notes (Signed)
    Chronic Care Management Pharmacy Assistant   Name: Terri Barajas  MRN: [id number] DOB: [birth year]   Reason for Encounter: Reschedule appointment   Medications: Outpatient Encounter Medications as of 05/02/2021  Medication Sig Note   ALPRAZolam (XANAX) 0.5 MG tablet Take 0.5 tablets (0.25 mg total) by mouth daily as needed for anxiety.    aspirin 81 MG chewable tablet Chew 81 mg by mouth daily.    BIOTIN PO Take 1 tablet by mouth daily.     calcium carbonate (OSCAL) 1500 (600 Ca) MG TABS tablet Take 600 mg of elemental calcium by mouth daily.    Cholecalciferol (VITAMIN D3) 25 MCG (1000 UT) CAPS Take 1,000 Units by mouth daily.    DEXILANT 60 MG capsule TAKE 1 CAPSULE BY MOUTH  DAILY    diltiazem (CARDIZEM) 30 MG tablet TAKE 1 TABLET BY MOUTH  TWICE DAILY    dorzolamide-timolol (COSOPT) 22.3-6.8 MG/ML ophthalmic solution Apply 1 drop to eye in the morning and at bedtime.    fluticasone (FLONASE) 50 MCG/ACT nasal spray Place 2 sprays into both nostrils daily. 12/28/2020: Placing 1 drop in both eyes at bedtime and in the morning.    hydrochlorothiazide (HYDRODIURIL) 25 MG tablet TAKE 1 TABLET BY MOUTH  DAILY    latanoprost (XALATAN) 0.005 % ophthalmic solution Place 1 drop into both eyes at bedtime.     Multiple Vitamins-Minerals (CENTRUM SILVER 50+WOMEN PO) Take 1 tablet by mouth daily.    Omega-3 Fatty Acids (FISH OIL PO) Take 1 capsule by mouth daily.    polycarbophil (FIBERCON) 625 MG tablet Take 625 mg by mouth daily.    polyethylene glycol (MIRALAX / GLYCOLAX) packet Take 17 g by mouth daily.    Red Yeast Rice Extract (RED YEAST RICE PO) Take 1 capsule by mouth daily.    RHOPRESSA 0.02 % SOLN Apply 1 drop to eye daily.    No facility-administered encounter medications on file as of 05/02/2021.   05-02-2021: Rescheduled patient's 05-03-2021 appointment with [name] CPP since she was just seen in October.  [place] Pharmacist  Assistant [phone]

## 2021-05-03 ENCOUNTER — Telehealth: Payer: Medicare Other

## 2021-05-07 ENCOUNTER — Telehealth: Payer: Self-pay

## 2021-05-07 NOTE — Chronic Care Management (AMB) (Signed)
Chronic Care Management Pharmacy Assistant   Name: Terri Barajas  MRN: [id number] DOB: 1945-12-23   Reason for Encounter: Disease State/ Hypertension  Recent office visits:  04-26-2021 Kellie Simmering, LPN. Medicare annual wellness visit  Recent consult visits:  04-13-2021 Marshell Garfinkel, MD (Pulmonary). CT shows stable mild changes from prior covid infection. There are no concerning findings.  Will order pulmonary function test at next available Follow-up in clinic in 6 months  04-16-2021 Marshell Garfinkel, MD (Pulmonary). Pulmonary function test completed.  Hospital visits:  None in previous 6 months  Medications: Outpatient Encounter Medications as of 05/07/2021  Medication Sig Note   ALPRAZolam (XANAX) 0.5 MG tablet Take 0.5 tablets (0.25 mg total) by mouth daily as needed for anxiety.    aspirin 81 MG chewable tablet Chew 81 mg by mouth daily.    BIOTIN PO Take 1 tablet by mouth daily.     calcium carbonate (OSCAL) 1500 (600 Ca) MG TABS tablet Take 600 mg of elemental calcium by mouth daily.    Cholecalciferol (VITAMIN D3) 25 MCG (1000 UT) CAPS Take 1,000 Units by mouth daily.    DEXILANT 60 MG capsule TAKE 1 CAPSULE BY MOUTH  DAILY    diltiazem (CARDIZEM) 30 MG tablet TAKE 1 TABLET BY MOUTH  TWICE DAILY    dorzolamide-timolol (COSOPT) 22.3-6.8 MG/ML ophthalmic solution Apply 1 drop to eye in the morning and at bedtime.    fluticasone (FLONASE) 50 MCG/ACT nasal spray Place 2 sprays into both nostrils daily. 12/28/2020: Placing 1 drop in both eyes at bedtime and in the morning.    hydrochlorothiazide (HYDRODIURIL) 25 MG tablet TAKE 1 TABLET BY MOUTH  DAILY    latanoprost (XALATAN) 0.005 % ophthalmic solution Place 1 drop into both eyes at bedtime.     Multiple Vitamins-Minerals (CENTRUM SILVER 50+WOMEN PO) Take 1 tablet by mouth daily.    Omega-3 Fatty Acids (FISH OIL PO) Take 1 capsule by mouth daily.    polycarbophil (FIBERCON) 625 MG tablet Take 625 mg by mouth  daily.    polyethylene glycol (MIRALAX / GLYCOLAX) packet Take 17 g by mouth daily.    Red Yeast Rice Extract (RED YEAST RICE PO) Take 1 capsule by mouth daily.    RHOPRESSA 0.02 % SOLN Apply 1 drop to eye daily.    No facility-administered encounter medications on file as of 05/07/2021.  Reviewed chart prior to disease state call. Spoke with Barajas regarding BP  Recent Office Vitals: BP Readings from Last 3 Encounters:  04/13/21 118/72  03/27/21 120/84  03/08/21 114/72   Pulse Readings from Last 3 Encounters:  04/13/21 87  03/27/21 73  03/08/21 77    Wt Readings from Last 3 Encounters:  04/26/21 148 lb (67.1 kg)  04/13/21 148 lb 3.2 oz (67.2 kg)  03/27/21 146 lb 6.4 oz (66.4 kg)     Kidney Function Lab Results  Component Value Date/Time   CREATININE 0.79 08/18/2020 12:38 PM   CREATININE 0.82 03/01/2020 11:06 AM   GFR 90.57 08/03/2019 01:40 PM   GFRNONAA 72 03/01/2020 11:06 AM   GFRAA 83 03/01/2020 11:06 AM    BMP Latest Ref Rng & Units 08/18/2020 03/01/2020 02/07/2020  Glucose 65 - 99 mg/dL 110(H) 106(H) 127(H)  BUN 8 - 27 mg/dL 20 21 21   Creatinine 0.57 - 1.00 mg/dL 0.79 0.82 0.73  BUN/Creat Ratio 12 - 28 25 26  29(H)  Sodium 134 - 144 mmol/L 139 140 138  Potassium 3.5 - 5.2 mmol/L 3.4(L) 4.0  3.3(L)  Chloride 96 - 106 mmol/L 98 100 95(L)  CO2 20 - 29 mmol/L 28 30(H) 27  Calcium 8.7 - 10.3 mg/dL 9.7 10.0 10.0    Current antihypertensive regimen:  Cardizem 30 mg twice daily Hydrochlorothiazide 25 mg daily  How often are you checking your Blood Pressure? Barajas states once weekly.  Current home BP readings: 118/70  What recent interventions/DTPs have been made by any provider to improve Blood Pressure control since last CPP Visit:  Educated on BP goals and benefits of medications for prevention of heart attack, stroke and kidney damage; Exercise goal of 150 minutes per week; Importance of home blood pressure monitoring; Proper BP monitoring technique; Counseled  to monitor BP at home at least once per week, document, and provide log at future appointments  Any recent hospitalizations or ED visits since last visit with CPP? No  What diet changes have been made to improve Blood Pressure Control?  Barajas stated she has limited her salt intake.  What exercise is being done to improve your Blood Pressure Control?  Barajas states she uses a stationary bicycle daily.  Adherence Review: Is the Barajas currently on ACE/ARB medication? No Does the Barajas have >5 day gap between last estimated fill dates? No  Care Gaps: Shingrix overdue Last AWV 04-26-2021  Star Rating Drugs: None  Jeannette How [facility] Clinical Pharmacist Assistant [phone]

## 2021-05-08 MED ORDER — REPATHA SURECLICK 140 MG/ML ~~LOC~~ SOAJ: 140.0000 mg | SUBCUTANEOUS | 11 refills | Status: DC

## 2021-05-08 NOTE — Addendum Note (Signed)
Addended by: Allean Found on: 05/08/2021 01:01 PM   Modules accepted: Orders

## 2021-05-08 NOTE — Addendum Note (Signed)
Addended by: Allean Found on: 05/08/2021 12:08 PM   Modules accepted: Orders

## 2021-05-08 NOTE — Telephone Encounter (Signed)
Called and spoke w/pt regarding the approval and sent repatha rx and instructed the pt to get the fasting labs post 4th dose pt voiced understanding.

## 2021-05-08 NOTE — Telephone Encounter (Signed)
Called and spoke w/pt and they stated that they would like to try the repatha so I sent a pa to insurance, got hwf grant approved and emailed to pt, and ordered released. I informed the pt that I would contact them when the approvals comes back and then we will sed it to pharmacy and the pt voiced understanding

## 2021-05-17 ENCOUNTER — Telehealth: Payer: Self-pay

## 2021-05-17 NOTE — Telephone Encounter (Signed)
Patient seeking information on status of Repatha.  Please advise.  Thank you

## 2021-05-21 MED ORDER — REPATHA SURECLICK 140 MG/ML ~~LOC~~ SOAJ: 140.0000 mg | SUBCUTANEOUS | 11 refills | Status: DC

## 2021-05-21 NOTE — Addendum Note (Signed)
Addended by: Allean Found on: 05/21/2021 08:33 AM   Modules accepted: Orders

## 2021-05-21 NOTE — Telephone Encounter (Signed)
Called and spoke w/pt and stated that they were already approved. The real issue was that the pharmacy never told them the medication was ready. I decided to resend the rx and instructed the pt to contact the pharmacy if they hadn't heard from them in a timely manner and they voiced understanding

## 2021-05-22 ENCOUNTER — Other Ambulatory Visit: Payer: Self-pay | Admitting: Gastroenterology

## 2021-05-31 ENCOUNTER — Ambulatory Visit: Payer: Medicare Other | Admitting: Neurology

## 2021-06-13 ENCOUNTER — Telehealth: Payer: Self-pay

## 2021-06-13 NOTE — Chronic Care Management (AMB) (Signed)
° ° °  Chronic Care Management Pharmacy Assistant   Name: Terri Barajas  MRN: [id number] DOB: 04-06-1946   Reason for Encounter: Reschedule appointment    Medications: Outpatient Encounter Medications as of 06/13/2021  Medication Sig Note   ALPRAZolam (XANAX) 0.5 MG tablet Take 0.5 tablets (0.25 mg total) by mouth daily as needed for anxiety.    aspirin 81 MG chewable tablet Chew 81 mg by mouth daily.    BIOTIN PO Take 1 tablet by mouth daily.     calcium carbonate (OSCAL) 1500 (600 Ca) MG TABS tablet Take 600 mg of elemental calcium by mouth daily.    Cholecalciferol (VITAMIN D3) 25 MCG (1000 UT) CAPS Take 1,000 Units by mouth daily.    DEXILANT 60 MG capsule TAKE 1 CAPSULE BY MOUTH  DAILY    diltiazem (CARDIZEM) 30 MG tablet TAKE 1 TABLET BY MOUTH  TWICE DAILY    dorzolamide-timolol (COSOPT) 22.3-6.8 MG/ML ophthalmic solution Apply 1 drop to eye in the morning and at bedtime.    Evolocumab (REPATHA SURECLICK) 637 MG/ML SOAJ Inject 140 mg into the skin every 14 (fourteen) days.    fluticasone (FLONASE) 50 MCG/ACT nasal spray Place 2 sprays into both nostrils daily. 12/28/2020: Placing 1 drop in both eyes at bedtime and in the morning.    hydrochlorothiazide (HYDRODIURIL) 25 MG tablet TAKE 1 TABLET BY MOUTH  DAILY    latanoprost (XALATAN) 0.005 % ophthalmic solution Place 1 drop into both eyes at bedtime.     Multiple Vitamins-Minerals (CENTRUM SILVER 50+WOMEN PO) Take 1 tablet by mouth daily.    Omega-3 Fatty Acids (FISH OIL PO) Take 1 capsule by mouth daily.    polycarbophil (FIBERCON) 625 MG tablet Take 625 mg by mouth daily.    polyethylene glycol (MIRALAX / GLYCOLAX) packet Take 17 g by mouth daily.    Red Yeast Rice Extract (RED YEAST RICE PO) Take 1 capsule by mouth daily.    RHOPRESSA 0.02 % SOLN Apply 1 drop to eye daily.    No facility-administered encounter medications on file as of 06/13/2021.    06-13-2021: Called patient to reschedule telephone appointment with Madilyn Hook CPP on 06-21-2020 to 06-22-2020.  [place] Pharmacist Assistant [phone]

## 2021-06-20 ENCOUNTER — Telehealth: Payer: Self-pay

## 2021-06-20 NOTE — Chronic Care Management (AMB) (Signed)
°  [name] was reminded to have all medications, supplements and any blood glucose and blood pressure readings available for review with [name], Pharm. D, at her telephone visit on 06-22-2020 at 2:00.   Questions: Have you had any recent office visit or specialist visit outside of [place]? Patient stated no  Are there any concerns you would like to discuss during your office visit? Patient stated leg pain is still an issue.  Are you having any problems obtaining your medications? (Whether it pharmacy issues or cost) Patient stated her cardiologists prescribed repatha and was provided a discount card. Told patient that they should be able to give another discount card or start a application for patient assistance. Patient stated she has the provider information to contact when needed.  If patient has any PAP medications ask if they are having any problems getting their PAP medication or refill? NoPAP medications  Care Gaps: Shingrix overdue Last AWV 04-26-2021  Star Rating Drug: None  Any gaps in medications fill history? No  [place] Pharmacist Assistant [phone]

## 2021-06-21 ENCOUNTER — Telehealth: Payer: Medicare Other

## 2021-06-21 ENCOUNTER — Telehealth: Payer: Self-pay

## 2021-06-21 NOTE — Progress Notes (Signed)
° ° °  Chronic Care Management Pharmacy Assistant   Name: Terri Barajas  MRN: [id number] DOB: 1946/02/01   Reason for Encounter: Reminder call for appointment with CPP on 06/22/2021 at 2:00pm, patient confirmed.     [place] Pharmacist Assistant [phone]

## 2021-06-22 ENCOUNTER — Ambulatory Visit (HOSPITAL_COMMUNITY)
Admission: EM | Admit: 2021-06-22 | Discharge: 2021-06-22 | Disposition: A | Discharge status: Home / Self Care | Payer: Medicare Other | Attending: Family Medicine | Admitting: Family Medicine

## 2021-06-22 ENCOUNTER — Ambulatory Visit (INDEPENDENT_AMBULATORY_CARE_PROVIDER_SITE_OTHER): Payer: Medicare Other

## 2021-06-22 ENCOUNTER — Encounter (HOSPITAL_COMMUNITY): Payer: Self-pay

## 2021-06-22 ENCOUNTER — Other Ambulatory Visit: Payer: Self-pay

## 2021-06-22 DIAGNOSIS — Z20822 Contact with and (suspected) exposure to covid-19: Secondary | ICD-10-CM | POA: Insufficient documentation

## 2021-06-22 DIAGNOSIS — R059 Cough, unspecified: Secondary | ICD-10-CM | POA: Insufficient documentation

## 2021-06-22 DIAGNOSIS — K219 Gastro-esophageal reflux disease without esophagitis: Secondary | ICD-10-CM

## 2021-06-22 DIAGNOSIS — E782 Mixed hyperlipidemia: Secondary | ICD-10-CM

## 2021-06-22 DIAGNOSIS — J069 Acute upper respiratory infection, unspecified: Secondary | ICD-10-CM

## 2021-06-22 DIAGNOSIS — F418 Other specified anxiety disorders: Secondary | ICD-10-CM

## 2021-06-22 LAB — POC INFLUENZA A AND B ANTIGEN (URGENT CARE ONLY)
INFLUENZA A ANTIGEN, POC: NEGATIVE
INFLUENZA B ANTIGEN, POC: NEGATIVE

## 2021-06-22 MED ORDER — CHERATUSSIN AC 100-10 MG/5ML PO SOLN: 5.0000 mL | Freq: Four times a day (QID) | ORAL | 0 refills | Status: DC | PRN

## 2021-06-22 NOTE — Discharge Instructions (Addendum)
°  You have been swabbed for COVID, and the test will result in the next 24 hours. Our staff will call you if positive. If the test is positive, you should quarantine for 5 days.  Your flu test was negative.   Take codeine cough syrup 1 tsp every 6 hours as needed for cough

## 2021-06-22 NOTE — ED Provider Notes (Signed)
[facility]    CSN: [id number] Arrival date & time: 06/22/21  1743      History   Chief Complaint Chief Complaint  Patient presents with   Cough    HPI JERNIE SCHUTT is a 76 y.o. female.    Cough Here for h/o cough, congestion and sore throat since 1/3. Has had some chills and aches. Pressure in ears too. No measured fever, but has not checked. No n/v/d.   Did a home covid test, negative.  Past Medical History:  Diagnosis Date   Anxiety    Arthritis    Complication of anesthesia    some type of sedation medication made her feel crazy   Dysrhythmia    Family history of adverse reaction to anesthesia    daughter has Malignant Hyperthermia hx -    GERD (gastroesophageal reflux disease)    Glaucoma    H/O: hysterectomy 1978   Heart disease    History of hiatal hernia    Hypertension    MVP (mitral valve prolapse)    Panic attacks    Pre-diabetes    Sciatica     Patient Active Problem List   Diagnosis Date Noted   Myopathies 03/28/2021   Body mass index (BMI) 26.0-26.9, adult 07/06/2020   Disc displacement, lumbar 07/06/2020   OSA on CPAP 01/06/2019   Abnormal CT of the abdomen 10/04/2018   Radiculopathy affecting upper extremity, left 07/25/2018   Hyperlipidemia 05/13/2018   Situational anxiety 04/29/2018   MVP (mitral valve prolapse) 04/29/2018   Sleep-disordered breathing 04/29/2018   Family History of Hyperthermia, malignant 04/23/2018   Barrett's esophagus 03/01/2018   Elevated lipase 02/18/2018   Hiatal hernia 02/18/2018   Constipation 02/18/2018   Bloating 02/18/2018   Aortic atherosclerosis ([place]) 01/23/2018   Gastroesophageal reflux disease without esophagitis    Glaucoma    Hypertension     Past Surgical History:  Procedure Laterality Date   APPENDECTOMY  1972   BACK SURGERY  07/14/2020   chipped disc   BIOPSY  07/06/2018   Procedure: BIOPSY;  Surgeon: Irving Copas., MD;  Location: [facility];  Service:  Gastroenterology;;   CATARACT EXTRACTION  2013   [place] and 1978   COLONOSCOPY     ESOPHAGOGASTRODUODENOSCOPY (EGD) WITH PROPOFOL N/A 07/06/2018   Procedure: ESOPHAGOGASTRODUODENOSCOPY (EGD) WITH PROPOFOL;  Surgeon: Irving Copas., MD;  Location[facility] ENDOSCOPY;  Service: Gastroenterology;  Laterality: N/A;   HAND SURGERY Bilateral    HEMORRHOID SURGERY  2018   S/P Hysterectomy   1987    OB History   No obstetric history on file.      Home Medications    Prior to Admission medications   Medication Sig Start Date End Date Taking? Authorizing Provider  guaiFENesin-codeine (CHERATUSSIN AC) 100-10 MG/5ML syrup Take 5 mLs by mouth 4 (four) times daily as needed for cough. 06/22/21  Yes Catalea Labrecque, Gwenlyn Perking, MD  ALPRAZolam Duanne Moron) 0.5 MG tablet Take 0.5 tablets (0.25 mg total) by mouth daily as needed for anxiety. 04/01/19   Briscoe Deutscher, DO  aspirin 81 MG chewable tablet Chew 81 mg by mouth daily.    [provider]  BIOTIN PO Take 1 tablet by mouth daily.     [provider]  calcium carbonate (OSCAL) 1500 (600 Ca) MG TABS tablet Take 600 mg of elemental calcium by mouth daily.    [provider]  Cholecalciferol (VITAMIN D3) 25 MCG (1000 UT) CAPS Take 1,000 Units by mouth  daily.    [provider]  DEXILANT 60 MG capsule TAKE 1 CAPSULE BY MOUTH  DAILY 05/23/21   Mansouraty, Telford Nab., MD  diltiazem (CARDIZEM) 30 MG tablet TAKE 1 TABLET BY MOUTH  TWICE DAILY 08/23/20   Skeet Latch, MD  dorzolamide-timolol (COSOPT) 22.3-6.8 MG/ML ophthalmic solution Apply 1 drop to eye in the morning and at bedtime.    [provider]  Evolocumab (REPATHA SURECLICK) 329 MG/ML SOAJ Inject 140 mg into the skin every 14 (fourteen) days. 05/21/21   Skeet Latch, MD  fluticasone [facility]) 50 MCG/ACT nasal spray Place 2 sprays into both nostrils daily. 02/15/20   Mannam, Hart Robinsons, MD  hydrochlorothiazide (HYDRODIURIL) 25 MG tablet TAKE 1  TABLET BY MOUTH  DAILY 02/12/21   [name], FNP  latanoprost (XALATAN) 0.005 % ophthalmic solution Place 1 drop into both eyes at bedtime.     [provider]  Multiple Vitamins-Minerals (CENTRUM SILVER 50+WOMEN PO) Take 1 tablet by mouth daily.    [provider]  Omega-3 Fatty Acids (FISH OIL PO) Take 1 capsule by mouth daily.    [provider]  polycarbophil (FIBERCON) 625 MG tablet Take 625 mg by mouth daily.    [provider]  polyethylene glycol (MIRALAX / GLYCOLAX) packet Take 17 g by mouth daily.    [provider]  RHOPRESSA 0.02 % SOLN Apply 1 drop to eye daily. 05/25/20   [provider]    Family History Family History  Problem Relation Age of Onset   Pancreatic cancer Mother    Alcohol abuse Father    Asthma Sister    Hypertension Sister    Arthritis Sister    Diabetes Brother    Arthritis Brother    Hyperlipidemia Brother    Hypertension Brother    Glaucoma Brother    Colon polyps Brother    Arthritis Sister    Arthritis Sister    Prostate cancer Brother    Congenital heart disease Brother    Colon cancer Neg Hx    Esophageal cancer Neg Hx    Inflammatory bowel disease Neg Hx    Liver disease Neg Hx    Rectal cancer Neg Hx    Stomach cancer Neg Hx     Social History Social History   Tobacco Use   Smoking status: Never   Smokeless tobacco: Never  Vaping Use   Vaping Use: Never used  Substance Use Topics   Alcohol use: Not Currently   Drug use: Not Currently     Allergies   Atorvastatin, Atracurium & derivatives, Atropine, Iodine, Shellfish allergy, and Statins   Review of Systems Review of Systems  Respiratory:  Positive for cough.     Physical Exam Triage Vital Signs ED Triage Vitals  Enc Vitals Group     BP 06/22/21 1838 137/87     Pulse Rate 06/22/21 1838 (!) 106     Resp 06/22/21 1838 18     Temp 06/22/21 1838 98.2 F (36.8 C)     Temp Source 06/22/21 1838 Oral     SpO2  06/22/21 1838 96 %     Weight --      Height --      Head Circumference --      Peak Flow --      Pain Score 06/22/21 1839 0     Pain Loc --      Pain Edu? --      Excl. in GC? --    No  data found.  Updated Vital Signs BP 137/87 (BP Location: Left Arm)    Pulse (!) 106    Temp 98.2 F (36.8 C) (Oral)    Resp 18    LMP  (LMP Unknown)    SpO2 96%   Visual Acuity Right Eye Distance:   Left Eye Distance:   Bilateral Distance:    Right Eye Near:   Left Eye Near:    Bilateral Near:     Physical Exam Vitals reviewed.  Constitutional:      General: She is not in acute distress.    Appearance: She is not toxic-appearing.  HENT:     Right Ear: Tympanic membrane and ear canal normal.     Left Ear: Tympanic membrane and ear canal normal.     Nose: Congestion present.     Mouth/Throat:     Mouth: Mucous membranes are moist.     Pharynx: No oropharyngeal exudate or posterior oropharyngeal erythema.  Eyes:     Extraocular Movements: Extraocular movements intact.     Conjunctiva/sclera: Conjunctivae normal.     Pupils: Pupils are equal, round, and reactive to light.  Cardiovascular:     Rate and Rhythm: Normal rate and regular rhythm.     Heart sounds: No murmur heard. Pulmonary:     Effort: Pulmonary effort is normal.     Breath sounds: No stridor. No wheezing, rhonchi or rales.  Musculoskeletal:     Cervical back: Neck supple.  Skin:    Capillary Refill: Capillary refill takes less than 2 seconds.     Coloration: Skin is not jaundiced or pale.  Neurological:     General: No focal deficit present.     Mental Status: She is alert and oriented to person, place, and time.  Psychiatric:        Behavior: Behavior normal.     UC Treatments / Results  Labs (all labs ordered are listed, but only abnormal results are displayed) Labs Reviewed  SARS CORONAVIRUS 2 (TAT 6-24 HRS)  POC INFLUENZA A AND B ANTIGEN (URGENT CARE ONLY)    EKG   Radiology No results  found.  Procedures Procedures (including critical care time)  Medications Ordered in UC Medications - No data to display  Initial Impression / Assessment and Plan / UC Course  I have reviewed the triage vital signs and the nursing notes.  Pertinent labs & imaging results that were available during my care of the patient were reviewed by me and considered in my medical decision making (see chart for details).     Flu test is negative. If covid is positive, today is day 3-4, so when resulted would be within the 5 day window for her to benefit from antiviral. Cr is normal. She is elderly and has htn. I would send in paxlovid in for her. Final Clinical Impressions(s) / UC Diagnoses   Final diagnoses:  Viral URI with cough     Discharge Instructions       You have been swabbed for COVID, and the test will result in the next 24 hours. Our staff will call you if positive. If the test is positive, you should quarantine for 5 days.  Your flu test was negative.   Take codeine cough syrup 1 tsp every 6 hours as needed for cough     ED Prescriptions     Medication Sig Dispense Auth. Provider   guaiFENesin-codeine (CHERATUSSIN AC) 100-10 MG/5ML syrup Take 5 mLs by mouth 4 (four) times daily  as needed for cough. 120 mL Barrett Henle, MD      PDMP not reviewed this encounter.   Barrett Henle, MD 06/22/21 [phone]

## 2021-06-22 NOTE — Patient Instructions (Addendum)
Visit Information It was great speaking with you today!  Please let me know if you have any questions about our visit.   Goals Addressed             This Visit's Progress    Manage My Medicine       Timeframe:  Long-Range Goal Priority:  High Start Date:                             Expected End Date:                       Follow Up Date:10/23/2021   In Progress:  - call for medicine refill 2 or 3 days before it runs out - call if I am sick and can't take my medicine - keep a list of all the medicines I take; vitamins and herbals too - use a pillbox to sort medicine    Why is this important?   These steps will help you keep on track with your medicines.  Notes:  -Please let us know if you have any questions or concerns           Patient Care Plan: CCM Pharmacy Care Plan     Problem Identified: GERD, HLD   Priority: High     Long-Range Goal: Disease Management   Recent Progress: On track  Priority: High  Note:    Current Barriers:  Unable to independently monitor therapeutic efficacy  Pharmacist Clinical Goal(s):  Patient will achieve adherence to monitoring guidelines and medication adherence to achieve therapeutic efficacy through collaboration with PharmD and provider.   Interventions: 1:1 collaboration with [name], FNP regarding development and update of comprehensive plan of care as evidenced by provider attestation and co-signature Inter-disciplinary care team collaboration (see longitudinal plan of care) Comprehensive medication review performed; medication list updated in electronic medical record  GERD (Goal: recommend patient continue to current medication) -Controlled -Current treatment  Gas-x as needed Appropriate, Effective, Safe, Accessible Dexilant 60 mg capsule once per day Appropriate, Effective, Safe, Accessible Patient reports that she does not miss a day of her medication  -Medications previously tried: Tums   -Patient has had  acid reflux for 10-12 years.   -Symptoms : stomach bloating, food does not digest properly and a miserable feeling  -Patient reports being on [place] for the past 10-12 years  -First diagnosed when she lived in [place] -Patient reports that her bones have been feeling different -She takes the medication once per day prior to eating any meals  -Recommended to continue current medication  Hyperlipidemia: (LDL goal < 70) -Uncontrolled -Current treatment: Repatha 140 mg every 14 days. Appropriate, Effective, Safe, Accessible -Medications previously tried: Atorvastatin 10 mg, Rosuvastatin- in [place] -Current dietary patterns: patient has cut back on fried and fatty foods.  -Current exercise habits: please see hypertension -Educated on Benefits of statin for ASCVD risk reduction; Importance of limiting foods high in cholesterol; -Patient started taking Repatha and is doing well with medication  -She also asked me about spacing out atorvastatin three times per week  -I assured the patient that she could reach out to either practice for reassurance and more information -Collaborate with PCP team and add G72.00 to patients profile for statin intolerance.      -G72.00 was added to patients chart  -Recommended to continue current medication  Anxiety (Goal: reduce signs and symptoms of anxiety) -Controlled -Current treatment: Alprazolam 0.5  mg - taking 1 tablet by mouth as neededAppropriate, Effective, Safe, Accessible Rarely taking  -GAD7: 4  -Patient reports that at this time she has a strong support system including friends and family  -Educated on Benefits of cognitive-behavioral therapy with or without medication -Recommended to continue current medication  Patient Goals/Self-Care Activities Patient will:  - take medications as prescribed as evidenced by patient report and record review  Follow Up Plan: The patient has been provided with contact information for the care  management team and has been advised to call with any health related questions or concerns.         Patient agreed to services and verbal consent obtained.   The patient verbalized understanding of instructions, educational materials, and care plan provided today and agreed to receive a mailed copy of patient instructions, educational materials, and care plan.   [name], PharmD Clinical Pharmacist [facility] [phone]

## 2021-06-22 NOTE — ED Triage Notes (Signed)
Pt c/o cough, nasal congestion, headache, sore throat, body aces, and ear pressure x3 days.

## 2021-06-22 NOTE — Progress Notes (Signed)
Chronic Care Management Pharmacy Note  07/02/2021 Name:  Terri Barajas MRN:  [id number] DOB:  1945-09-26  Summary: Patient reports that she has had a cold.   Recommendations/Changes made from today's visit: Recommend patient to go to urgent care for not feeling well today. Recommend patient receive Shingrix vaccine.  Plan: Patient is going to the urgent care. She is going to look into [facility]    Subjective: Terri Barajas is an 76 y.o. year old female who is a primary patient of [name], [place].  The CCM team was consulted for assistance with disease management and care coordination needs.    Engaged with patient by telephone for follow up visit in response to provider referral for pharmacy case management and/or care coordination services.   Consent to Services:  The patient was given information about Chronic Care Management services, agreed to services, and gave verbal consent prior to initiation of services.  Please see initial visit note for detailed documentation.   Patient Care Team: [name], FNP as PCP - General ([place]) Skeet Latch, MD as PCP - Cardiology (Cardiology) Rossie Muskrat, MD as Referring Physician (Podiatry) Mayford Knife, [facility] (Pharmacist)  Recent office visits: 02/07/2021 PCP OV  Recent consult visits: 04/13/2021 Pulmonology OV  02/28/2021 [facility] visits: None in previous 6 months   Objective:  Lab Results  Component Value Date   CREATININE 0.79 08/18/2020   BUN 20 08/18/2020   GFR 90.57 08/03/2019   GFRNONAA 72 03/01/2020   GFRAA 83 03/01/2020   NA 139 08/18/2020   K 3.4 (L) 08/18/2020   CALCIUM 9.7 08/18/2020   CO2 28 08/18/2020   GLUCOSE 110 (H) 08/18/2020    Lab Results  Component Value Date/Time   HGBA1C 6.1 (H) 02/07/2021 12:17 PM   HGBA1C 6.3 (H) 08/18/2020 12:38 PM   GFR 90.57 08/03/2019 01:40 PM   GFR 66.53 05/27/2018 10:45 AM    MICROALBUR 30 08/18/2020 11:21 AM    Last diabetic Eye exam: No results found for: HMDIABEYEEXA  Last diabetic Foot exam: No results found for: HMDIABFOOTEX   Lab Results  Component Value Date   CHOL 228 (H) 02/07/2021   HDL 62 02/07/2021   LDLCALC 144 (H) 02/07/2021   TRIG 127 02/07/2021   CHOLHDL 3.7 02/07/2021    Hepatic Function Latest Ref Rng & Units 08/18/2020 08/03/2019 07/10/2018  Total Protein 6.0 - 8.5 g/dL 6.9 7.1 7.3  Albumin 3.7 - 4.7 g/dL 4.2 4.0 4.0  AST 0 - 40 IU/L 13 17 21   ALT 0 - 32 IU/L 16 13 20   Alk Phosphatase 44 - 121 IU/L 83 79 61  Total Bilirubin 0.0 - 1.2 mg/dL 0.5 0.5 0.6  Bilirubin, Direct 0.0 - 0.3 mg/dL - - -    Lab Results  Component Value Date/Time   TSH 1.22 08/03/2019 01:40 PM   TSH 1.21 05/27/2018 10:45 AM   FREET4 0.74 05/27/2018 10:45 AM    CBC Latest Ref Rng & Units 08/18/2020 02/15/2020 08/03/2019  WBC 3.4 - 10.8 x10E3/uL 6.7 5.7 6.7  Hemoglobin 11.1 - 15.9 g/dL 12.6 13.3 12.0  Hematocrit 34.0 - 46.6 % 38.4 39.2 36.1  Platelets 150 - 450 x10E3/uL 271 248.0 216.0    Lab Results  Component Value Date/Time   VD25OH 92.9 08/18/2020 12:38 PM    Clinical ASCVD: Yes  The 10-year ASCVD risk score (Arnett DK, et al., 2019) is: 37.6%   Values used  to calculate the score:     Age: 43 years     Sex: Female     Is Non-Hispanic African American: Yes     Diabetic: Yes     Tobacco smoker: No     Systolic Blood Pressure: 782 mmHg     Is BP treated: Yes     HDL Cholesterol: 62 mg/dL     Total Cholesterol: 228 mg/dL    Depression screen [facility] 2/9 04/26/2021 05/18/2020 11/04/2019  Decreased Interest 0 1 0  Down, Depressed, Hopeless 0 1 0  PHQ - 2 Score 0 2 0  Altered sleeping - - -  Tired, decreased energy - - -  Change in appetite - - -  Feeling bad or failure about yourself  - - -  Trouble concentrating - - -  Moving slowly or fidgety/restless - - -  Suicidal thoughts - - -  PHQ-9 Score - - -  Difficult doing work/chores - - -    Social  History   Tobacco Use  Smoking Status Never  Smokeless Tobacco Never   BP Readings from Last 3 Encounters:  06/22/21 137/87  04/13/21 118/72  03/27/21 120/84   Pulse Readings from Last 3 Encounters:  06/22/21 (!) 106  04/13/21 87  03/27/21 73   Wt Readings from Last 3 Encounters:  04/26/21 148 lb (67.1 kg)  04/13/21 148 lb 3.2 oz (67.2 kg)  03/27/21 146 lb 6.4 oz (66.4 kg)   BMI Readings from Last 3 Encounters:  04/26/21 26.22 kg/m  04/13/21 26.25 kg/m  03/27/21 25.93 kg/m    Assessment/Interventions: Review of patient past medical history, allergies, medications, health status, including review of consultants reports, laboratory and other test data, was performed as part of comprehensive evaluation and provision of chronic care management services.   SDOH:  (Social Determinants of Health) assessments and interventions performed: No  SDOH Screenings   Alcohol Screen: Not on file  Depression (PHQ2-9): Low Risk    PHQ-2 Score: 0  Financial Resource Strain: Low Risk    Difficulty of Paying Living Expenses: Not hard at all  Food Insecurity: No Food Insecurity   Worried About Charity fundraiser in the Last Year: Never true   Ran Out of Food in the Last Year: Never true  Housing: Not on file  Physical Activity: Sufficiently Active   Days of Exercise per Week: 7 days   Minutes of Exercise per Session: 30 min  Social Connections: Not on file  Stress: No Stress Concern Present   Feeling of Stress : Not at all  Tobacco Use: Low Risk    Smoking Tobacco Use: Never   Smokeless Tobacco Use: Never   Passive Exposure: Not on file  Transportation Needs: No Transportation Needs   Lack of Transportation (Medical): No   Lack of Transportation (Non-Medical): No    CCM Care Plan  Allergies  Allergen Reactions   Atorvastatin     Myalgias    Atracurium & Derivatives Other (See Comments)    Unknown   Atropine Other (See Comments)    Heart racing   Iodine Other (See  Comments)    Unknown   Shellfish Allergy Other (See Comments)    Unknown   Statins     "didn't feel right"     Medications Reviewed Today     Reviewed by Luiz Blare, RN (Registered Nurse) on 06/22/21 at [place] List Status: <None>   Medication Order Taking? Sig Documenting Provider Last Dose Status  Informant  ALPRAZolam (XANAX) 0.5 MG tablet [id number]  Take 0.5 tablets (0.25 mg total) by mouth daily as needed for anxiety. Briscoe Deutscher, DO  Active   aspirin 81 MG chewable tablet [id number]  Chew 81 mg by mouth daily. [provider]  Active Self  BIOTIN PO [id number]  Take 1 tablet by mouth daily.  [provider]  Active Self  calcium carbonate (OSCAL) 1500 (600 Ca) MG TABS tablet [id number]  Take 600 mg of elemental calcium by mouth daily. [provider]  Active Self  Cholecalciferol (VITAMIN D3) 25 MCG (1000 UT) CAPS [id number]  Take 1,000 Units by mouth daily. [provider]  Active Self  DEXILANT 60 MG capsule [id number]  TAKE 1 CAPSULE BY MOUTH  DAILY Mansouraty, Telford Nab., MD  Active   diltiazem (CARDIZEM) 30 MG tablet [id number]  TAKE 1 TABLET BY MOUTH  TWICE DAILY Skeet Latch, MD  Active   dorzolamide-timolol (COSOPT) 22.3-6.8 MG/ML ophthalmic solution [id number]  Apply 1 drop to eye in the morning and at bedtime. [provider]  Active   Evolocumab (REPATHA SURECLICK) 163 MG/ML SOAJ [id number]  Inject 140 mg into the skin every 14 (fourteen) days. Skeet Latch, MD  Active   fluticasone [facility]) 50 MCG/ACT nasal spray [id number]  Place 2 sprays into both nostrils daily. Marshell Garfinkel, MD  Active            Med Note Pricilla Holm Dec 28, 2020 12:25 PM) Placing 1 drop in both eyes at bedtime and in the morning.   hydrochlorothiazide (HYDRODIURIL) 25 MG tablet [id number]  TAKE 1 TABLET BY MOUTH  DAILY [name], FNP  Active   latanoprost (XALATAN) 0.005 % ophthalmic solution [id number]  Place 1 drop into both  eyes at bedtime.  [provider]  Active Self  Multiple Vitamins-Minerals (CENTRUM SILVER 50+WOMEN PO) [id number]  Take 1 tablet by mouth daily. [provider]  Active Self  Omega-3 Fatty Acids (FISH OIL PO) [id number]  Take 1 capsule by mouth daily. [provider]  Active Self  polycarbophil (FIBERCON) 625 MG tablet [id number]  Take 625 mg by mouth daily. [provider]  Active Self  polyethylene glycol (MIRALAX / GLYCOLAX) packet [id number]  Take 17 g by mouth daily. [provider]  Active   RHOPRESSA 0.02 % SOLN [id number]  Apply 1 drop to eye daily. [provider]  Active             Patient Active Problem List   Diagnosis Date Noted   Myopathies 03/28/2021   Body mass index (BMI) 26.0-26.9, adult 07/06/2020   Disc displacement, lumbar 07/06/2020   OSA on CPAP 01/06/2019   Abnormal CT of the abdomen 10/04/2018   Radiculopathy affecting upper extremity, left 07/25/2018   Hyperlipidemia 05/13/2018   Situational anxiety 04/29/2018   MVP (mitral valve prolapse) 04/29/2018   Sleep-disordered breathing 04/29/2018   Family History of Hyperthermia, malignant 04/23/2018   Barrett's esophagus 03/01/2018   Elevated lipase 02/18/2018   Hiatal hernia 02/18/2018   Constipation 02/18/2018   Bloating 02/18/2018   Aortic atherosclerosis ([place]) 01/23/2018   Gastroesophageal reflux disease without esophagitis    Glaucoma    Hypertension     Immunization History  Administered Date(s) Administered   Fluad Quad(high Dose 65+) 03/11/2019   Influenza, High Dose Seasonal PF 04/29/2018, 02/26/2021   Influenza-Unspecified 03/16/2020   PFIZER Comirnaty(Gray Top)Covid-19 Tri-Sucrose Vaccine 12/26/2020   PFIZER(Purple Top)SARS-COV-2 Vaccination 09/02/2019, 09/28/2019, 03/27/2020, 04/14/2021   Pfizer  Covid-19 Vaccine Bivalent Booster 68yrs & up 04/14/2021   Pneumococcal Conjugate-13 03/11/2019   Pneumococcal Polysaccharide-23 06/09/2017   Tdap  02/10/2021    Conditions to be addressed/monitored:  GERD and Anxiety  Care Plan : [place]  Updates made by Mayford Knife, RPH since 07/02/2021 12:00 AM     Problem: GERD, Anxiety, HLD   Priority: High     Long-Range Goal: Disease Management   Recent Progress: On track  Priority: High  Note:    Current Barriers:  Unable to independently monitor therapeutic efficacy  Pharmacist Clinical Goal(s):  Patient will achieve adherence to monitoring guidelines and medication adherence to achieve therapeutic efficacy through collaboration with PharmD and provider.   Interventions: 1:1 collaboration with [name], FNP regarding development and update of comprehensive plan of care as evidenced by provider attestation and co-signature Inter-disciplinary care team collaboration (see longitudinal plan of care) Comprehensive medication review performed; medication list updated in electronic medical record  GERD (Goal: recommend patient continue to current medication) -Controlled -Current treatment  Gas-x as needed Appropriate, Effective, Safe, Accessible Dexilant 60 mg capsule once per day Appropriate, Effective, Safe, Accessible Patient reports that she does not miss a day of her medication  -Medications previously tried: Tums   -Patient has had acid reflux for 10-12 years.   -Symptoms : stomach bloating, food does not digest properly and a miserable feeling  -Patient reports being on [place] for the past 10-12 years  -First diagnosed when she lived in [place] -Patient reports that her bones have been feeling different -She takes the medication once per day prior to eating any meals  -Recommended to continue current medication  Hyperlipidemia: (LDL goal < 70) -Uncontrolled -Current treatment: Repatha 140 mg every 14 days. Appropriate, Effective, Safe, Accessible -Medications previously tried: Atorvastatin 10 mg, Rosuvastatin- in [place] -Current  dietary patterns: patient has cut back on fried and fatty foods.  -Current exercise habits: please see hypertension -Educated on Benefits of statin for ASCVD risk reduction; Importance of limiting foods high in cholesterol; -Patient started taking Repatha and is doing well with medication  -She also asked me about spacing out atorvastatin three times per week  -I assured the patient that she could reach out to either practice for reassurance and more information -Collaborate with PCP team and add G72.00 to patients profile for statin intolerance.      -G72.00 was added to patients chart  -Patient is going to call [place], Gerald Stabs back to let him know that she is going to start the [place]. She is hopeful but worried.  -Recommended to continue current medication  Anxiety (Goal: reduce signs and symptoms of anxiety) -Controlled -Current treatment: Alprazolam 0.5 mg - taking 1 tablet by mouth as neededAppropriate, Effective, Safe, Accessible Rarely taking  -GAD7: 4  -Patient reports that at this time she has a strong support system including friends and family  -Educated on Benefits of cognitive-behavioral therapy with or without medication -Recommended to continue current medication  Patient Goals/Self-Care Activities Patient will:  - take medications as prescribed as evidenced by patient report and record review  Follow Up Plan: The patient has been provided with contact information for the care management team and has been advised to call with any health related questions or concerns.        Medication Assistance: None required.  Patient affirms current coverage meets needs.  Compliance/Adherence/Medication fill history: Care Gaps: Shingrix Vaccine  Star-Rating Drugs: None noted  Patient[facility]  [id number]  [address] [place] [address] Phone: [phone] Fax: [phone]  [facility]  ([place], [place] [facility] [address] [place] [address] [place] [address] Phone: [phone] Fax: [phone]  [facility] ([facility] ) - [place], [place] [address] Phone: [phone] Fax: [phone]  Uses pill box? No - patient is not currently using a pill box Pt endorses 95% compliance  We discussed: Benefits of medication synchronization, packaging and delivery as well as enhanced pharmacist oversight with [employer]. Patient decided to: Continue current medication management strategy  Care Plan and Follow Up Patient Decision:  Patient agrees to Care Plan and Follow-up.  Plan: The patient has been provided with contact information for the care management team and has been advised to call with any health related questions or concerns.   [name], CPP, PharmD Clinical Pharmacist Practitioner [facility] [phone]

## 2021-06-23 LAB — SARS CORONAVIRUS 2 (TAT 6-24 HRS): SARS Coronavirus 2: NEGATIVE

## 2021-07-02 DIAGNOSIS — E785 Hyperlipidemia, unspecified: Secondary | ICD-10-CM | POA: Diagnosis not present

## 2021-07-03 LAB — LIPID PANEL
Chol/HDL Ratio: 2.9 ratio (ref 0.0–4.4)
Cholesterol, Total: 154 mg/dL (ref 100–199)
HDL: 54 mg/dL (ref >39)
LDL Chol Calc (NIH): 71 mg/dL (ref 0–99)
Triglycerides: 171 mg/dL — ABNORMAL HIGH (ref 0–149)
VLDL Cholesterol Cal: 29 mg/dL (ref 5–40)

## 2021-07-05 ENCOUNTER — Telehealth: Payer: Self-pay | Admitting: Pharmacist

## 2021-07-05 NOTE — Telephone Encounter (Signed)
LDL has improved on Repatha. Recommend continuing once every other week.  Called patient to discuss. No answer, lmom

## 2021-07-08 ENCOUNTER — Encounter: Payer: Self-pay | Admitting: Nurse Practitioner

## 2021-07-10 ENCOUNTER — Encounter: Payer: Self-pay | Admitting: Pharmacist

## 2021-07-11 NOTE — Telephone Encounter (Signed)
Pt incoming call and went over ldl decrease from lab looks great and continue all meds as prescribed.

## 2021-07-17 DIAGNOSIS — E782 Mixed hyperlipidemia: Secondary | ICD-10-CM

## 2021-07-17 DIAGNOSIS — F418 Other specified anxiety disorders: Secondary | ICD-10-CM | POA: Diagnosis not present

## 2021-07-22 ENCOUNTER — Other Ambulatory Visit: Payer: Self-pay | Admitting: Cardiovascular Disease

## 2021-07-26 ENCOUNTER — Encounter: Payer: Self-pay | Admitting: Nurse Practitioner

## 2021-07-26 ENCOUNTER — Other Ambulatory Visit: Payer: Self-pay

## 2021-07-26 ENCOUNTER — Ambulatory Visit (INDEPENDENT_AMBULATORY_CARE_PROVIDER_SITE_OTHER): Payer: Medicare Other | Admitting: Nurse Practitioner

## 2021-07-26 VITALS — BP 122/64 | HR 75 | Temp 98.4°F | Ht 62.2 in | Wt 149.6 lb

## 2021-07-26 DIAGNOSIS — I700 Atherosclerosis of aorta: Secondary | ICD-10-CM | POA: Diagnosis not present

## 2021-07-26 DIAGNOSIS — Z789 Other specified health status: Secondary | ICD-10-CM | POA: Diagnosis not present

## 2021-07-26 DIAGNOSIS — I10 Essential (primary) hypertension: Secondary | ICD-10-CM | POA: Diagnosis not present

## 2021-07-26 DIAGNOSIS — M542 Cervicalgia: Secondary | ICD-10-CM

## 2021-07-26 DIAGNOSIS — E782 Mixed hyperlipidemia: Secondary | ICD-10-CM | POA: Diagnosis not present

## 2021-07-26 MED ORDER — DICLOFENAC SODIUM 1 % EX GEL: 2.0000 g | Freq: Four times a day (QID) | CUTANEOUS | 2 refills | Status: DC

## 2021-07-26 NOTE — Progress Notes (Signed)
I,[name],acting as a Education administrator for [employer], FNP.,have documented all relevant documentation on the behalf of [name], FNP,as directed by  [name], FNP while in the presence of [name], [place].  This visit occurred during the SARS-CoV-2 public health emergency.  Safety protocols were in place, including screening questions prior to the visit, additional usage of staff PPE, and extensive cleaning of exam room while observing appropriate contact time as indicated for disinfecting solutions.  Subjective:     Patient ID: Terri Barajas , female    DOB: 01-26-1946 , 77 y.o.   MRN: [id number]   Chief Complaint  Patient presents with   Hypertension    HPI  The patient is here today for a cholesterol follow-up.  She stopped taking the atorvastatin due to side effects.   Hypertension This is a chronic problem. The current episode started more than 1 year ago. The problem is uncontrolled. Pertinent negatives include no chest pain or palpitations. There is no history of chronic renal disease.  Hyperlipidemia This is a chronic problem. The current episode started more than 1 year ago. The problem is uncontrolled. She has no history of chronic renal disease. Pertinent negatives include no chest pain. Current antihyperlipidemic treatment includes statins and diet change. Risk factors for coronary artery disease include obesity and a sedentary lifestyle.    Past Medical History:  Diagnosis Date   Anxiety    Arthritis    Complication of anesthesia    some type of sedation medication made her feel crazy   Dysrhythmia    Family history of adverse reaction to anesthesia    daughter has Malignant Hyperthermia hx -    GERD (gastroesophageal reflux disease)    Glaucoma    H/O: hysterectomy 1978   Heart disease    History of hiatal hernia    Hypertension    MVP (mitral valve prolapse)    Panic attacks    Pre-diabetes    Sciatica      Family History  Problem Relation Age  of Onset   Pancreatic cancer Mother    Alcohol abuse Father    Asthma Sister    Hypertension Sister    Arthritis Sister    Diabetes Brother    Arthritis Brother    Hyperlipidemia Brother    Hypertension Brother    Glaucoma Brother    Colon polyps Brother    Arthritis Sister    Arthritis Sister    Prostate cancer Brother    Congenital heart disease Brother    Colon cancer Neg Hx    Esophageal cancer Neg Hx    Inflammatory bowel disease Neg Hx    Liver disease Neg Hx    Rectal cancer Neg Hx    Stomach cancer Neg Hx      Current Outpatient Medications:    diclofenac Sodium (VOLTAREN) 1 % GEL, Apply 2 g topically 4 (four) times daily., Disp: 100 g, Rfl: 2   ALPRAZolam (XANAX) 0.5 MG tablet, Take 0.5 tablets (0.25 mg total) by mouth daily as needed for anxiety., Disp: 45 tablet, Rfl: 0   aspirin 81 MG chewable tablet, Chew 81 mg by mouth daily., Disp: , Rfl:    BIOTIN PO, Take 1 tablet by mouth daily. , Disp: , Rfl:    calcium carbonate (OSCAL) 1500 (600 Ca) MG TABS tablet, Take 600 mg of elemental calcium by mouth daily., Disp: , Rfl:    Cholecalciferol (VITAMIN D3) 25 MCG (1000 UT) CAPS, Take 1,000 Units by mouth  daily., Disp: , Rfl:    DEXILANT 60 MG capsule, TAKE 1 CAPSULE BY MOUTH  DAILY, Disp: 90 capsule, Rfl: 1   diltiazem (CARDIZEM) 30 MG tablet, TAKE 1 TABLET BY MOUTH  TWICE DAILY, Disp: 180 tablet, Rfl: 3   dorzolamide-timolol (COSOPT) 22.3-6.8 MG/ML ophthalmic solution, Apply 1 drop to eye in the morning and at bedtime., Disp: , Rfl:    Evolocumab (REPATHA SURECLICK) 354 MG/ML SOAJ, Inject 140 mg into the skin every 14 (fourteen) days., Disp: 2 mL, Rfl: 11   fluticasone (FLONASE) 50 MCG/ACT nasal spray, Place 2 sprays into both nostrils daily., Disp: 16 g, Rfl: 2   guaiFENesin-codeine (CHERATUSSIN AC) 100-10 MG/5ML syrup, Take 5 mLs by mouth 4 (four) times daily as needed for cough., Disp: 120 mL, Rfl: 0   hydrochlorothiazide (HYDRODIURIL) 25 MG tablet, TAKE 1 TABLET BY  MOUTH  DAILY, Disp: 90 tablet, Rfl: 3   latanoprost (XALATAN) 0.005 % ophthalmic solution, Place 1 drop into both eyes at bedtime. , Disp: , Rfl:    Multiple Vitamins-Minerals (CENTRUM SILVER 50+WOMEN PO), Take 1 tablet by mouth daily., Disp: , Rfl:    Omega-3 Fatty Acids (FISH OIL PO), Take 1 capsule by mouth daily., Disp: , Rfl:    polycarbophil (FIBERCON) 625 MG tablet, Take 625 mg by mouth daily., Disp: , Rfl:    polyethylene glycol (MIRALAX / GLYCOLAX) packet, Take 17 g by mouth daily., Disp: , Rfl:    RHOPRESSA 0.02 % SOLN, Apply 1 drop to eye daily., Disp: , Rfl:    Allergies  Allergen Reactions   Atorvastatin     Myalgias    Atracurium & Derivatives Other (See Comments)    Unknown   Atropine Other (See Comments)    Heart racing   Iodine Other (See Comments)    Unknown   Shellfish Allergy Other (See Comments)    Unknown   Statins     "didn't feel right"      Review of Systems  Constitutional: Negative.   Respiratory: Negative.    Cardiovascular: Negative.  Negative for chest pain, palpitations and leg swelling.  Gastrointestinal: Negative.   Neurological: Negative.   Psychiatric/Behavioral: Negative.      Today's Vitals   07/26/21 1001  BP: 122/64  Pulse: 75  Temp: 98.4 F (36.9 C)  TempSrc: Oral  Weight: 149 lb 9.6 oz (67.9 kg)  Height: 5' 2.2" (1.58 m)   Body mass index is 27.19 kg/m.   Objective:  Physical Exam Vitals reviewed.  Constitutional:      General: She is not in acute distress.    Appearance: Normal appearance.  Cardiovascular:     Rate and Rhythm: Normal rate and regular rhythm.     Pulses: Normal pulses.     Heart sounds: Normal heart sounds. No murmur heard. Pulmonary:     Effort: Pulmonary effort is normal. No respiratory distress.     Breath sounds: Normal breath sounds. No wheezing.  Abdominal:     General: Abdomen is flat. Bowel sounds are normal. There is no distension.     Palpations: Abdomen is soft.     Tenderness: There is  no abdominal tenderness.  Musculoskeletal:     Cervical back: Tenderness (right sternocleidoid muscle tenderness) present.  Skin:    Capillary Refill: Capillary refill takes less than 2 seconds.  Neurological:     General: No focal deficit present.     Mental Status: She is alert and oriented to person, place, and time.  Cranial Nerves: No cranial nerve deficit.     Motor: No weakness.  Psychiatric:        Mood and Affect: Mood normal.        Behavior: Behavior normal.        Thought Content: Thought content normal.        Judgment: Judgment normal.        Assessment And Plan:     1. Essential hypertension Comments: Blood pressure is well controlled, continue current medications - Microalbumin / Creatinine Urine Ratio - CMP14+EGFR  2. Mixed hyperlipidemia Comments: Cholesterol levels are improved with Repatha, discussed foods to avoid with elevated triglycerides.   3. Aortic atherosclerosis ([place]) Comments: Continue Repatha  4. Statin intolerance  5. Neck pain Comments: right neck pain, right muscle tension. Rx for diclofenac sent to pharmacy. Encouraged to do neck exercises. Return call if not better - diclofenac Sodium (VOLTAREN) 1 % GEL; Apply 2 g topically 4 (four) times daily.  Dispense: 100 g; Refill: 2     Patient was given opportunity to ask questions. Patient verbalized understanding of the plan and was able to repeat key elements of the plan. All questions were answered to their satisfaction.  [name], FNP   I, [name], FNP, have reviewed all documentation for this visit. The documentation on 07/26/21 for the exam, diagnosis, procedures, and orders are all accurate and complete.   IF YOU HAVE BEEN REFERRED TO A SPECIALIST, IT MAY TAKE 1-2 WEEKS TO SCHEDULE/PROCESS THE REFERRAL. IF YOU HAVE NOT HEARD FROM US/SPECIALIST IN TWO WEEKS, PLEASE GIVE [place] A CALL AT [phone].   THE PATIENT IS ENCOURAGED TO PRACTICE SOCIAL DISTANCING DUE TO THE  COVID-19 PANDEMIC.

## 2021-07-26 NOTE — Patient Instructions (Signed)

## 2021-07-27 ENCOUNTER — Telehealth: Payer: Self-pay | Admitting: Gastroenterology

## 2021-07-27 LAB — MICROALBUMIN / CREATININE URINE RATIO
Creatinine, Urine: 154.6 mg/dL
Microalb/Creat Ratio: 3 mg/g creat (ref 0–29)
Microalbumin, Urine: 4.9 ug/mL

## 2021-07-27 NOTE — Telephone Encounter (Signed)
Please advise on recommendation for different PPI?

## 2021-07-27 NOTE — Telephone Encounter (Signed)
Patient called states the Dexilant medication is back ordered from [facility] and she is wondering what she can do because she is out of medication.

## 2021-07-29 NOTE — Telephone Encounter (Signed)
She has been on previous PPIs but not anything different than Dexilant for years. She can be on Nexium 40 mg twice daily until Dexilant returns or Aciphex 20 mg twice daily. Thanks. GM

## 2021-07-30 ENCOUNTER — Telehealth: Payer: Self-pay | Admitting: Gastroenterology

## 2021-07-30 MED ORDER — ESOMEPRAZOLE MAGNESIUM 40 MG PO CPDR: 40.0000 mg | DELAYED_RELEASE_CAPSULE | Freq: Two times a day (BID) | ORAL | 1 refills | Status: DC

## 2021-07-30 NOTE — Telephone Encounter (Signed)
Pres[facility] Nexium 40mg  [facility] If nexium works, then patient will let me know and we will send a 90 day supply to her mail order pharmacy at that time.

## 2021-07-30 NOTE — Telephone Encounter (Signed)
Patient called and says she normally gets her Dexilant through her mail order pharmacy; however, they are on backorder, and she said it would probably be 30 days before she can receive any.  Patient is asking 1) if there is another medication she can take that would be less expensive, or 2) is there somewhere else she can get the [place]?  Please call patient and advise.  Thank you.

## 2021-07-31 DIAGNOSIS — G4733 Obstructive sleep apnea (adult) (pediatric): Secondary | ICD-10-CM | POA: Diagnosis not present

## 2021-08-13 DIAGNOSIS — I10 Essential (primary) hypertension: Secondary | ICD-10-CM | POA: Diagnosis not present

## 2021-08-13 DIAGNOSIS — H401132 Primary open-angle glaucoma, bilateral, moderate stage: Secondary | ICD-10-CM | POA: Diagnosis not present

## 2021-08-17 DIAGNOSIS — M7062 Trochanteric bursitis, left hip: Secondary | ICD-10-CM | POA: Diagnosis not present

## 2021-08-17 DIAGNOSIS — M1712 Unilateral primary osteoarthritis, left knee: Secondary | ICD-10-CM | POA: Diagnosis not present

## 2021-08-17 DIAGNOSIS — M7052 Other bursitis of knee, left knee: Secondary | ICD-10-CM | POA: Diagnosis not present

## 2021-08-28 ENCOUNTER — Other Ambulatory Visit: Payer: Self-pay | Admitting: Gastroenterology

## 2021-09-03 ENCOUNTER — Encounter: Payer: Self-pay | Admitting: Neurology

## 2021-09-03 ENCOUNTER — Ambulatory Visit: Payer: Medicare Other | Admitting: Neurology

## 2021-09-03 VITALS — BP 117/71 | HR 85 | Ht 63.0 in | Wt 147.0 lb

## 2021-09-03 DIAGNOSIS — I700 Atherosclerosis of aorta: Secondary | ICD-10-CM

## 2021-09-03 DIAGNOSIS — Z9989 Dependence on other enabling machines and devices: Secondary | ICD-10-CM

## 2021-09-03 DIAGNOSIS — R140 Abdominal distension (gaseous): Secondary | ICD-10-CM | POA: Diagnosis not present

## 2021-09-03 DIAGNOSIS — K2270 Barrett's esophagus without dysplasia: Secondary | ICD-10-CM

## 2021-09-03 DIAGNOSIS — G4734 Idiopathic sleep related nonobstructive alveolar hypoventilation: Secondary | ICD-10-CM | POA: Diagnosis not present

## 2021-09-03 DIAGNOSIS — G4733 Obstructive sleep apnea (adult) (pediatric): Secondary | ICD-10-CM

## 2021-09-03 NOTE — Progress Notes (Signed)
CM sent to [facility] for new order ?

## 2021-09-03 NOTE — Progress Notes (Signed)
?SLEEP MEDICINE CLINIC ? ? ?Provider:  Larey Seat, MD. ?  ?Primary Care Physician:  Terri Brine, FNP ? ? ?Referring Provider: [name], FNP  ? ? ? ?Chief Complaint  ?Patient presents with  ? Follow-up  ?  Pt alone, rm 10. Presents today for CPAP follow up. Overall having problems with the head gear rubbing hair out and also waking up with dry mouth. The humidification level was increased to 6 back Jan 2023. States that hasn't helped. She was set up 08/2018 and last SS 07/2018  ? ? ?HPI:  Terri Barajas is a 76 y.o.AA female patient, she is seen here in [facility] . 09-03-2021.  This is my first revisit with Terri Barajas since her initial consultation in January 2020 and she was referred by her nurse practitioner Terri Barajas.  Her husband had complained about her snoring and she woke up with a very dry mouth and sore throat.  She underwent sleep testing which confirms the presence of apnea and was prescribed CPAP.  She is using an AutoSet between a pressure setting of 5 and 12 cmH2O with 3 cm water expiratory relief setting.  Her average user time is 4 hours 44 minutes her compliance for days is 100% the compliance for over 4 hours is 78%.  Some nights especially very recently she has just fallen short of the 4 hours use.  She does have a significant enough air leak but her residual AHI is low and I do not see a reason to intervene.   ?She uses a FFM and she hates the headgear- would like to try a mask that has ear loops.  ?Her residual AHI is 0.5.  Her pressure setting at the 95th percentile is 10 cm water and her air leak at the 95th percentile is 35 L.  The air leak varies greatly that may be related to sleep habits or if the mask was recently exchanged for a new liner. ? ?The Epworth Sleepiness Scale was today endorsed at 5 out of 24 points the fatigue severity scale 10 out of 63 points.  The geriatric depression score was endorsed at only one-point out of 15. She reports she no longer snores, but  that may depend on her interface.  ? ? ? ? ? ?Consult took place on 06-30-2018 upon referral from NP . Moore for a sleep evaluation. The patient considers herself a mouth breather, her husband stated she snores. She has woken up with a very dry mouth and sore throat in November , while heating the house. She drank water through the night. The dry mouth disappeared in December. ? ?Chief complaint according to patient : " I am very cold natured and have a dry mouth".  ? ?Sleep habits are as follows: dinner time for [name] is around 5-6 PM. She is retired as is her husband. She  Spends evenings watching TV, doing housekeeping. He would go to bed at 10 Pm .  She sleeps easily and wakes up once for nocturia. She may fall asleep on the sofa watching TV.  Sleeps on average 6-8 hours, wakes up spontaneously in AM when her husband leaves for work. Feeling refreshed, no headaches, no nausea.  ? ?Sleep medical history: no tonsillectomy, snoring , but no apnea being witnessed.  ? ?Family sleep history: sister with OSA,  one of 12 children in [place], [place]. Parents may have snored.  ? ?Social history: no night shift work history. She worked at [employer] for 48 years, mostly  in accounting. Now retired.  Married with 2 children 57 and 38 .  She has an exercise room at the condo complex, she can't get access to the machines.  ?Non smoker, non drinker, caffeine : rare sodas, no iced tea, in winter some hot chocolate. Hot black or green tea in winter decaffeinated. ?  ? ? ?Review of Systems: ?Out of a complete 14 system review, the patient complains of only the following symptoms, and all other reviewed systems are negative.Snoring. may have newly diagnosed Diabetic, dry moth and fatigue. .  ? ?The Epworth Sleepiness Scale was today endorsed at 5 out of 24 points the fatigue severity scale 10 out of 63 points.  ? The geriatric depression score was endorsed at only one-point out of 15. ? ?Social History   ? ?Socioeconomic History  ? Marital status: Married  ?  Spouse name: Not on file  ? Number of children: Not on file  ? Years of education: 79  ? Highest education level: Not on file  ?Occupational History  ? Occupation: retired  ?Tobacco Use  ? Smoking status: Never  ? Smokeless tobacco: Never  ?Vaping Use  ? Vaping Use: Never used  ?Substance and Sexual Activity  ? Alcohol use: Not Currently  ? Drug use: Not Currently  ? Sexual activity: Yes  ?  Partners: Male  ?Other Topics Concern  ? Not on file  ?Social History Narrative  ? Not on file  ? ?Social Determinants of Health  ? ?Financial Resource Strain: Low Risk   ? Difficulty of Paying Living Expenses: Not hard at all  ?Food Insecurity: No Food Insecurity  ? Worried About Charity fundraiser in the Last Year: Never true  ? Ran Out of Food in the Last Year: Never true  ?Transportation Needs: No Transportation Needs  ? Lack of Transportation (Medical): No  ? Lack of Transportation (Non-Medical): No  ?Physical Activity: Sufficiently Active  ? Days of Exercise per Week: 7 days  ? Minutes of Exercise per Session: 30 min  ?Stress: No Stress Concern Present  ? Feeling of Stress : Not at all  ?Social Connections: Not on file  ?Intimate Partner Violence: Not on file  ? ? ?Family History  ?Problem Relation Age of Onset  ? Pancreatic cancer Mother   ? Alcohol abuse Father   ? Asthma Sister   ? Hypertension Sister   ? Arthritis Sister   ? Diabetes Brother   ? Arthritis Brother   ? Hyperlipidemia Brother   ? Hypertension Brother   ? Glaucoma Brother   ? Colon polyps Brother   ? Arthritis Sister   ? Arthritis Sister   ? Prostate cancer Brother   ? Congenital heart disease Brother   ? Colon cancer Neg Hx   ? Esophageal cancer Neg Hx   ? Inflammatory bowel disease Neg Hx   ? Liver disease Neg Hx   ? Rectal cancer Neg Hx   ? Stomach cancer Neg Hx   ? ? ?Past Medical History:  ?Diagnosis Date  ? Anxiety   ? Arthritis   ? Complication of anesthesia   ? some type of sedation  medication made her feel crazy  ? Dysrhythmia   ? Family history of adverse reaction to anesthesia   ? daughter has Malignant Hyperthermia hx -   ? GERD (gastroesophageal reflux disease)   ? Glaucoma   ? H/O: hysterectomy 1978  ? Heart disease   ? History  of hiatal hernia   ? Hypertension   ? MVP (mitral valve prolapse)   ? Panic attacks   ? Pre-diabetes   ? Sciatica   ? ? ?Past Surgical History:  ?Procedure Laterality Date  ? APPENDECTOMY  1972  ? BACK SURGERY  07/14/2020  ? chipped disc  ? BIOPSY  07/06/2018  ? Procedure: BIOPSY;  Surgeon: Irving Copas., MD;  Location: [place];  Service: Gastroenterology;;  ? CATARACT EXTRACTION  2013  ? [place]  ? COLONOSCOPY    ? ESOPHAGOGASTRODUODENOSCOPY (EGD) WITH PROPOFOL N/A 07/06/2018  ? Procedure: ESOPHAGOGASTRODUODENOSCOPY (EGD) WITH PROPOFOL;  Surgeon: Rush Landmark Telford Nab., MD;  Location: [place];  Service: Gastroenterology;  Laterality: N/A;  ? HAND SURGERY Bilateral   ? HEMORRHOID SURGERY  2018  ? S/P Hysterectomy   1987  ? ? ?Current Outpatient Medications  ?Medication Sig Dispense Refill  ? ALPRAZolam (XANAX) 0.5 MG tablet Take 0.5 tablets (0.25 mg total) by mouth daily as needed for anxiety. 45 tablet 0  ? aspirin 81 MG chewable tablet Chew 81 mg by mouth daily.    ? BIOTIN PO Take 1 tablet by mouth daily.     ? calcium carbonate (OSCAL) 1500 (600 Ca) MG TABS tablet Take 600 mg of elemental calcium by mouth daily.    ? Cholecalciferol (VITAMIN D3) 25 MCG (1000 UT) CAPS Take 1,000 Units by mouth daily.    ? diltiazem (CARDIZEM) 30 MG tablet TAKE 1 TABLET BY MOUTH  TWICE DAILY 180 tablet 3  ? dorzolamide-timolol (COSOPT) 22.3-6.8 MG/ML ophthalmic solution Apply 1 drop to eye in the morning and at bedtime.    ? esomeprazole (NEXIUM) 40 MG capsule TAKE 1 CAPSULE (40 MG TOTAL) BY MOUTH 2 (TWO) TIMES DAILY BEFORE A MEAL. 60 capsule 1  ? Evolocumab (REPATHA SURECLICK) 357 MG/ML SOAJ Inject 140 mg into the skin every 14  (fourteen) days. 2 mL 11  ? fluticasone (FLONASE) 50 MCG/ACT nasal spray Place 2 sprays into both nostrils daily. 16 g 2  ? hydrochlorothiazide (HYDRODIURIL) 25 MG tablet TAKE 1 TABLET BY MOUTH  DAILY 90 tablet 3  ? latan

## 2021-09-03 NOTE — Patient Instructions (Signed)

## 2021-09-04 ENCOUNTER — Other Ambulatory Visit: Payer: Self-pay | Admitting: Gastroenterology

## 2021-09-04 NOTE — Addendum Note (Signed)
Addended by: Darleen Crocker on: 09/04/2021 11:58 AM ? ? Modules accepted: Orders ? ?

## 2021-09-10 ENCOUNTER — Encounter: Payer: Self-pay | Admitting: Nurse Practitioner

## 2021-09-12 ENCOUNTER — Telehealth: Payer: Self-pay

## 2021-09-12 NOTE — Chronic Care Management (AMB) (Signed)
? ? ?Chronic Care Management ?Pharmacy Assistant  ? ?Name: LIANAH PEED  MRN: [id number] DOB: [birth year] ? ?Reason for Encounter: Disease State/ Hypertension ? ?Recent office visits:  ?07-26-2021 [name], [place]. START diclofenac sodium gel apply 2 grams topically 4 times daily. ? ?Recent consult visits:  ?09-03-2021 Dohmeier, Asencion Partridge, MD (Neurology). COMPLETED diclofenac gel and CHERATUSSIN AC. Orders placed for CPAP. ? ?Hospital visits:  ?Medication Reconciliation was completed by comparing discharge summary, patient?s EMR and Pharmacy list, and upon discussion with patient. ? ?Admitted to the hospital on 06-22-2021 due to Viral URI with cough. Discharge date was 06-22-2021. Discharged from [facility] urgent [facility].   ? ?New?Medications Started at [facility] Discharge:?? ?CHERATUSSIN AC 100-10 mg/52m take 5 mls 4 times daily as needed. ? ?Medication Changes at Hospital Discharge: ?None ? ?Medications Discontinued at Hospital Discharge: ?None ? ?Medications that remain the same after Hospital Discharge:??  ?-All other medications will remain the same.   ? ?Medications: ?Outpatient Encounter Medications as of 09/12/2021  ?Medication Sig Note  ? ALPRAZolam (XANAX) 0.5 MG tablet Take 0.5 tablets (0.25 mg total) by mouth daily as needed for anxiety.   ? aspirin 81 MG chewable tablet Chew 81 mg by mouth daily.   ? BIOTIN PO Take 1 tablet by mouth daily.    ? calcium carbonate (OSCAL) 1500 (600 Ca) MG TABS tablet Take 600 mg of elemental calcium by mouth daily.   ? Cholecalciferol (VITAMIN D3) 25 MCG (1000 UT) CAPS Take 1,000 Units by mouth daily.   ? diltiazem (CARDIZEM) 30 MG tablet TAKE 1 TABLET BY MOUTH  TWICE DAILY   ? dorzolamide-timolol (COSOPT) 22.3-6.8 MG/ML ophthalmic solution Apply 1 drop to eye in the morning and at bedtime.   ? esomeprazole (NEXIUM) 40 MG capsule TAKE 1 CAPSULE (40 MG TOTAL) BY MOUTH 2 (TWO) TIMES DAILY BEFORE A MEAL.   ? Evolocumab (REPATHA SURECLICK) 1315MG/ML SOAJ Inject 140 mg  into the skin every 14 (fourteen) days.   ? fluticasone (FLONASE) 50 MCG/ACT nasal spray Place 2 sprays into both nostrils daily. 12/28/2020: Placing 1 drop in both eyes at bedtime and in the morning.   ? hydrochlorothiazide (HYDRODIURIL) 25 MG tablet TAKE 1 TABLET BY MOUTH  DAILY   ? latanoprost (XALATAN) 0.005 % ophthalmic solution Place 1 drop into both eyes at bedtime.    ? Multiple Vitamins-Minerals (CENTRUM SILVER 50+WOMEN PO) Take 1 tablet by mouth daily.   ? Omega-3 Fatty Acids (FISH OIL PO) Take 1 capsule by mouth daily.   ? polycarbophil (FIBERCON) 625 MG tablet Take 625 mg by mouth daily.   ? polyethylene glycol (MIRALAX / GLYCOLAX) packet Take 17 g by mouth daily.   ? RHOPRESSA 0.02 % SOLN Apply 1 drop to eye daily.   ? ?No facility-administered encounter medications on file as of 09/12/2021.  ?Reviewed chart prior to disease state call. Spoke with patient regarding BP ? ?Recent Office Vitals: ?BP Readings from Last 3 Encounters:  ?09/03/21 117/71  ?07/26/21 122/64  ?06/22/21 137/87  ? ?Pulse Readings from Last 3 Encounters:  ?09/03/21 85  ?07/26/21 75  ?06/22/21 (!) 106  ?  ?Wt Readings from Last 3 Encounters:  ?09/03/21 147 lb (66.7 kg)  ?07/26/21 149 lb 9.6 oz (67.9 kg)  ?04/26/21 148 lb (67.1 kg)  ?  ? ?Kidney Function ?Lab Results  ?Component Value Date/Time  ? CREATININE 0.79 08/18/2020 12:38 PM  ? CREATININE 0.82 03/01/2020 11:06 AM  ? GFR 90.57 08/03/2019 01:40 PM  ? GQMGQQPYP  72 03/01/2020 11:06 AM  ? GFRAA 83 03/01/2020 11:06 AM  ? ? ? ?  Latest Ref Rng & Units 08/18/2020  ? 12:38 PM 03/01/2020  ? 11:06 AM 02/07/2020  ?  2:33 PM  ?BMP  ?Glucose 65 - 99 mg/dL 110   106   127    ?BUN 8 - 27 mg/dL '20   21   21    '$ ?Creatinine 0.57 - 1.00 mg/dL 0.79   0.82   0.73    ?BUN/Creat Ratio 12 - '28 25   26   29    '$ ?Sodium 134 - 144 mmol/L 139   140   138    ?Potassium 3.5 - 5.2 mmol/L 3.4   4.0   3.3    ?Chloride 96 - 106 mmol/L 98   100   95    ?CO2 20 - 29 mmol/L '28   30   27    '$ ?Calcium 8.7 - 10.3 mg/dL 9.7    10.0   10.0    ? ? ?Current antihypertensive regimen:  ?Cardizem 30 mg twice daily ?Hydrochlorothiazide 25 mg daily ? ?How often are you checking your Blood Pressure? 1-2x per week ? ?Current home BP readings: 105/63 81, 112/70 75 ? ?What recent interventions/DTPs have been made by any provider to improve Blood Pressure control since last CPP Visit:  ?Educated on BP goals and benefits of medications for prevention of heart attack, stroke and kidney damage; ?Exercise goal of 150 minutes per week; ?Importance of home blood pressure monitoring; ?Proper BP monitoring technique; ?Counseled to monitor BP at home at least once per week, document, and provide log at future appointments ? ?Any recent hospitalizations or ED visits since last visit with CPP? Yes ? ?What diet changes have been made to improve Blood Pressure Control?  ?Patient stated she has limited her salt intake, drinks plenty of water and eats fruits/ vegetables. ? ?What exercise is being done to improve your Blood Pressure Control?  ?Patient states she uses a stationary bicycle some days. ? ?Adherence Review: ?Is the patient currently on ACE/ARB medication? No ?Does the patient have >5 day gap between last estimated fill dates? No ? ?Care Gaps: ?Shingrix overdue ?Last AWV 04-26-2021 ?  ?Star Rating Drugs: ?None ? ?Malecca Hicks CMA ?Clinical Pharmacist Assistant ?[phone] ? ?

## 2021-09-24 DIAGNOSIS — E119 Type 2 diabetes mellitus without complications: Secondary | ICD-10-CM | POA: Diagnosis not present

## 2021-09-24 LAB — HM DIABETES EYE EXAM

## 2021-10-01 ENCOUNTER — Encounter: Payer: Self-pay | Admitting: Nurse Practitioner

## 2021-10-18 ENCOUNTER — Telehealth: Payer: Self-pay

## 2021-10-18 NOTE — Chronic Care Management (AMB) (Signed)
? ? ?Chronic Care Management ?Pharmacy Assistant  ? ?Name: Terri Barajas  MRN: [id number] DOB: [birth year] ? ? ?Reason for Encounter: Disease State/ Hypertension ?  ?Recent office visits:  ?None ? ?Recent consult visits:  ?None ? ?Hospital visits:  ?Medication Reconciliation was completed by comparing discharge summary, patient?s EMR and Pharmacy list, and upon discussion with patient. ?  ?Admitted to the hospital on 06-22-2021 due to Viral URI with cough. Discharge date was 06-22-2021. Discharged from [facility] urgent [facility].   ?  ?New?Medications Started at [facility] Discharge:?? ?CHERATUSSIN AC 100-10 mg/13m take 5 mls 4 times daily as needed. ?  ?Medication Changes at Hospital Discharge: ?None ?  ?Medications Discontinued at Hospital Discharge: ?None ?  ?Medications that remain the same after Hospital Discharge:??  ?-All other medications will remain the same.   ? ?Medications: ?Outpatient Encounter Medications as of 10/18/2021  ?Medication Sig Note  ? ALPRAZolam (XANAX) 0.5 MG tablet Take 0.5 tablets (0.25 mg total) by mouth daily as needed for anxiety.   ? aspirin 81 MG chewable tablet Chew 81 mg by mouth daily.   ? BIOTIN PO Take 1 tablet by mouth daily.    ? calcium carbonate (OSCAL) 1500 (600 Ca) MG TABS tablet Take 600 mg of elemental calcium by mouth daily.   ? Cholecalciferol (VITAMIN D3) 25 MCG (1000 UT) CAPS Take 1,000 Units by mouth daily.   ? diltiazem (CARDIZEM) 30 MG tablet TAKE 1 TABLET BY MOUTH  TWICE DAILY   ? dorzolamide-timolol (COSOPT) 22.3-6.8 MG/ML ophthalmic solution Apply 1 drop to eye in the morning and at bedtime.   ? esomeprazole (NEXIUM) 40 MG capsule TAKE 1 CAPSULE (40 MG TOTAL) BY MOUTH 2 (TWO) TIMES DAILY BEFORE A MEAL.   ? Evolocumab (REPATHA SURECLICK) 1527MG/ML SOAJ Inject 140 mg into the skin every 14 (fourteen) days.   ? fluticasone (FLONASE) 50 MCG/ACT nasal spray Place 2 sprays into both nostrils daily. 12/28/2020: Placing 1 drop in both eyes at bedtime and in the  morning.   ? hydrochlorothiazide (HYDRODIURIL) 25 MG tablet TAKE 1 TABLET BY MOUTH  DAILY   ? latanoprost (XALATAN) 0.005 % ophthalmic solution Place 1 drop into both eyes at bedtime.    ? Multiple Vitamins-Minerals (CENTRUM SILVER 50+WOMEN PO) Take 1 tablet by mouth daily.   ? Omega-3 Fatty Acids (FISH OIL PO) Take 1 capsule by mouth daily.   ? polycarbophil (FIBERCON) 625 MG tablet Take 625 mg by mouth daily.   ? polyethylene glycol (MIRALAX / GLYCOLAX) packet Take 17 g by mouth daily.   ? RHOPRESSA 0.02 % SOLN Apply 1 drop to eye daily.   ? ?No facility-administered encounter medications on file as of 10/18/2021.  ? ?Reviewed chart prior to disease state call. Spoke with patient regarding BP ? ?Recent Office Vitals: ?BP Readings from Last 3 Encounters:  ?09/03/21 117/71  ?07/26/21 122/64  ?06/22/21 137/87  ? ?Pulse Readings from Last 3 Encounters:  ?09/03/21 85  ?07/26/21 75  ?06/22/21 (!) 106  ?  ?Wt Readings from Last 3 Encounters:  ?09/03/21 147 lb (66.7 kg)  ?07/26/21 149 lb 9.6 oz (67.9 kg)  ?04/26/21 148 lb (67.1 kg)  ?  ? ?Kidney Function ?Lab Results  ?Component Value Date/Time  ? CREATININE 0.79 08/18/2020 12:38 PM  ? CREATININE 0.82 03/01/2020 11:06 AM  ? GFR 90.57 08/03/2019 01:40 PM  ? GFRNONAA 72 03/01/2020 11:06 AM  ? GFRAA 83 03/01/2020 11:06 AM  ? ? ? ?  Latest Ref Rng &  Units 08/18/2020  ? 12:38 PM 03/01/2020  ? 11:06 AM 02/07/2020  ?  2:33 PM  ?BMP  ?Glucose 65 - 99 mg/dL 110   106   127    ?BUN 8 - 27 mg/dL '20   21   21    '$ ?Creatinine 0.57 - 1.00 mg/dL 0.79   0.82   0.73    ?BUN/Creat Ratio 12 - '28 25   26   29    '$ ?Sodium 134 - 144 mmol/L 139   140   138    ?Potassium 3.5 - 5.2 mmol/L 3.4   4.0   3.3    ?Chloride 96 - 106 mmol/L 98   100   95    ?CO2 20 - 29 mmol/L '28   30   27    '$ ?Calcium 8.7 - 10.3 mg/dL 9.7   10.0   10.0    ? ? ?Current antihypertensive regimen:  ?Cardizem 30 mg twice daily ?Hydrochlorothiazide 25 mg daily ? ?How often are you checking your Blood Pressure? 1-2x per week ? ?Current  home BP readings: 113/67 84, 123/63 77 ? ?What recent interventions/DTPs have been made by any provider to improve Blood Pressure control since last CPP Visit:  ?Educated on BP goals and benefits of medications for prevention of heart attack, stroke and kidney damage; ?Exercise goal of 150 minutes per week; ?Importance of home blood pressure monitoring; ?Proper BP monitoring technique; ?Counseled to monitor BP at home at least once per week, document, and provide log at future appointments ? ?Any recent hospitalizations or ED visits since last visit with CPP? Yes ? ?What diet changes have been made to improve Blood Pressure Control?  ?Patient stated she has limited her salt intake, drinks plenty of water and eats fruits/ vegetables. ? ?What exercise is being done to improve your Blood Pressure Control?  ?Patient states she uses a stationary bicycle some days and floor exercise.  ? ?Adherence Review: ?Is the patient currently on ACE/ARB medication? No ?Does the patient have >5 day gap between last estimated fill dates? No ? ?Care Gaps: ?Shingrix overdue ?Last AWV 04-26-2021 ?  ?Star Rating Drugs: ?None ? ?Malecca Hicks CMA ?Clinical Pharmacist Assistant ?[phone] ? ?

## 2021-10-22 DIAGNOSIS — M1612 Unilateral primary osteoarthritis, left hip: Secondary | ICD-10-CM | POA: Insufficient documentation

## 2021-10-22 DIAGNOSIS — M7062 Trochanteric bursitis, left hip: Secondary | ICD-10-CM | POA: Diagnosis not present

## 2021-10-22 DIAGNOSIS — M7052 Other bursitis of knee, left knee: Secondary | ICD-10-CM | POA: Diagnosis not present

## 2021-10-23 ENCOUNTER — Other Ambulatory Visit: Payer: Self-pay | Admitting: Nurse Practitioner

## 2021-10-23 ENCOUNTER — Telehealth: Payer: Medicare Other

## 2021-10-23 DIAGNOSIS — I10 Essential (primary) hypertension: Secondary | ICD-10-CM

## 2021-10-25 ENCOUNTER — Telehealth: Payer: Self-pay | Admitting: Neurology

## 2021-10-25 ENCOUNTER — Other Ambulatory Visit: Payer: Self-pay | Admitting: Neurology

## 2021-10-25 DIAGNOSIS — G4733 Obstructive sleep apnea (adult) (pediatric): Secondary | ICD-10-CM

## 2021-10-25 NOTE — Telephone Encounter (Signed)
Called the patient back. She has tried ice cubes in the chamber, increase humidification level. She has tried all things over the counter r/t to biotene and none of this has helped in regards to dry mouth. I looked at DL and there are days she is having mask leaks. Advised for her to have a mask fitting completed and see if this helps. Order has been placed and sent to [facility]. ?

## 2021-10-25 NOTE — Telephone Encounter (Signed)
Pt is asking for a call to discuss concerns with CPAP. Pt is having dry mouth and dry throat, she is asking for a call to discuss. ?

## 2021-10-30 DIAGNOSIS — G4733 Obstructive sleep apnea (adult) (pediatric): Secondary | ICD-10-CM | POA: Diagnosis not present

## 2021-11-03 DIAGNOSIS — M25562 Pain in left knee: Secondary | ICD-10-CM | POA: Diagnosis not present

## 2021-11-05 ENCOUNTER — Telehealth: Payer: Self-pay | Admitting: Gastroenterology

## 2021-11-05 NOTE — Telephone Encounter (Signed)
The pt has continued bloating and would like to make an office visit to discuss.  She has been scheduled for Dr Rush Landmark on 7/19.  Sooner appt offered but pt declined due to going out of town. She will call her PCP in the meantime.

## 2021-11-05 NOTE — Telephone Encounter (Signed)
Inbound call from patient stating she is still having issues with bloating and gas. Patient stated that she believes that her Nexium is not working and is seeking advice. Please advise.

## 2021-11-06 DIAGNOSIS — M7062 Trochanteric bursitis, left hip: Secondary | ICD-10-CM | POA: Diagnosis not present

## 2021-11-06 DIAGNOSIS — M1612 Unilateral primary osteoarthritis, left hip: Secondary | ICD-10-CM | POA: Diagnosis not present

## 2021-11-20 DIAGNOSIS — R194 Change in bowel habit: Secondary | ICD-10-CM | POA: Diagnosis not present

## 2021-11-20 DIAGNOSIS — R141 Gas pain: Secondary | ICD-10-CM | POA: Diagnosis not present

## 2021-11-20 DIAGNOSIS — K2270 Barrett's esophagus without dysplasia: Secondary | ICD-10-CM | POA: Diagnosis not present

## 2021-12-17 ENCOUNTER — Telehealth: Payer: Self-pay

## 2021-12-17 NOTE — Chronic Care Management (AMB) (Signed)
12-17-2021: Left patient a VM to reschedule appointment with [name] on 01-02-2022. Appointment canceled.  [place] Pharmacist Assistant [phone]

## 2021-12-19 ENCOUNTER — Ambulatory Visit (INDEPENDENT_AMBULATORY_CARE_PROVIDER_SITE_OTHER): Payer: Medicare Other | Admitting: Nurse Practitioner

## 2021-12-19 ENCOUNTER — Encounter: Payer: Self-pay | Admitting: Nurse Practitioner

## 2021-12-19 VITALS — BP 112/68 | HR 78 | Temp 98.4°F | Ht 63.0 in | Wt 147.6 lb

## 2021-12-19 DIAGNOSIS — M2559 Pain in other specified joint: Secondary | ICD-10-CM | POA: Diagnosis not present

## 2021-12-19 DIAGNOSIS — R7303 Prediabetes: Secondary | ICD-10-CM

## 2021-12-19 DIAGNOSIS — M79605 Pain in left leg: Secondary | ICD-10-CM

## 2021-12-19 DIAGNOSIS — J849 Interstitial pulmonary disease, unspecified: Secondary | ICD-10-CM | POA: Diagnosis not present

## 2021-12-19 DIAGNOSIS — I10 Essential (primary) hypertension: Secondary | ICD-10-CM

## 2021-12-19 DIAGNOSIS — E559 Vitamin D deficiency, unspecified: Secondary | ICD-10-CM | POA: Diagnosis not present

## 2021-12-19 MED ORDER — HYDROCHLOROTHIAZIDE 25 MG PO TABS: 25.0000 mg | ORAL_TABLET | Freq: Every day | ORAL | 2 refills | Status: DC

## 2021-12-19 NOTE — Patient Instructions (Signed)

## 2021-12-19 NOTE — Progress Notes (Signed)
Terri Barajas,acting as a Education administrator for [name], FNP.,have documented all relevant documentation on the behalf of [name], FNP,as directed by  [name], FNP while in the presence of [name], Terri Barajas.   Subjective:     Patient ID: Terri Barajas , female    DOB: 09-Oct-1945 , 76 y.o.   MRN: [id number]   Chief Complaint  Patient presents with   Hemoglobin A1c Screening    HPI  Patient presents today for her A1c check. Patient states she is having a 5/10 pain in her left leg.  She has seen her GI provider in [place] for her bowel issues - take miralax in afternoon and stop taking fibercon and take probiotic. Sometimes her stomach is hard and thoughts she is not emptying her bowels. She is being followed by [facility] - Dr Williemae Area. No follow up at this time.     Past Medical History:  Diagnosis Date   Anxiety    Arthritis    Complication of anesthesia    some type of sedation medication made her feel crazy   Dysrhythmia    Family history of adverse reaction to anesthesia    daughter has Malignant Hyperthermia hx -    GERD (gastroesophageal reflux disease)    Glaucoma    H/O: hysterectomy 1978   Heart disease    History of hiatal hernia    Hypertension    MVP (mitral valve prolapse)    Panic attacks    Pre-diabetes    Sciatica      Family History  Problem Relation Age of Onset   Pancreatic cancer Mother    Alcohol abuse Father    Asthma Sister    Hypertension Sister    Arthritis Sister    Diabetes Brother    Arthritis Brother    Hyperlipidemia Brother    Hypertension Brother    Glaucoma Brother    Colon polyps Brother    Arthritis Sister    Arthritis Sister    Prostate cancer Brother    Congenital heart disease Brother    Colon cancer Neg Hx    Esophageal cancer Neg Hx    Inflammatory bowel disease Neg Hx    Liver disease Neg Hx    Rectal cancer Neg Hx    Stomach cancer Neg Hx      Current Outpatient Medications:    ALPRAZolam  (XANAX) 0.5 MG tablet, Take 0.5 tablets (0.25 mg total) by mouth daily as needed for anxiety., Disp: 45 tablet, Rfl: 0   aspirin 81 MG chewable tablet, Chew 81 mg by mouth daily., Disp: , Rfl:    BIOTIN PO, Take 1 tablet by mouth daily. , Disp: , Rfl:    calcium carbonate (OSCAL) 1500 (600 Ca) MG TABS tablet, Take 600 mg of elemental calcium by mouth daily., Disp: , Rfl:    Cholecalciferol (VITAMIN D3) 25 MCG (1000 UT) CAPS, Take 1,000 Units by mouth daily., Disp: , Rfl:    diltiazem (CARDIZEM) 30 MG tablet, TAKE 1 TABLET BY MOUTH  TWICE DAILY, Disp: 180 tablet, Rfl: 3   dorzolamide-timolol (COSOPT) 22.3-6.8 MG/ML ophthalmic solution, Apply 1 drop to eye in the morning and at bedtime., Disp: , Rfl:    esomeprazole (NEXIUM) 40 MG capsule, TAKE 1 CAPSULE (40 MG TOTAL) BY MOUTH 2 (TWO) TIMES DAILY BEFORE A MEAL., Disp: 180 capsule, Rfl: 1   Evolocumab (REPATHA SURECLICK) 818 MG/ML SOAJ, Inject 140 mg into the skin every 14 (fourteen) days., Disp: 2 mL, Rfl:  11   fluticasone (FLONASE) 50 MCG/ACT nasal spray, Place 2 sprays into both nostrils daily., Disp: 16 g, Rfl: 2   latanoprost (XALATAN) 0.005 % ophthalmic solution, Place 1 drop into both eyes at bedtime. , Disp: , Rfl:    Multiple Vitamins-Minerals (CENTRUM SILVER 50+WOMEN PO), Take 1 tablet by mouth daily., Disp: , Rfl:    Omega-3 Fatty Acids (FISH OIL PO), Take 1 capsule by mouth daily., Disp: , Rfl:    polyethylene glycol (MIRALAX / GLYCOLAX) packet, Take 17 g by mouth daily., Disp: , Rfl:    RHOPRESSA 0.02 % SOLN, Apply 1 drop to eye daily., Disp: , Rfl:    hydrochlorothiazide (HYDRODIURIL) 25 MG tablet, Take 1 tablet (25 mg total) by mouth daily., Disp: 90 tablet, Rfl: 2   Probiotic Product (ALIGN PO), Take by mouth., Disp: , Rfl:    Allergies  Allergen Reactions   Atorvastatin     Myalgias    Atracurium & Derivatives Other (See Comments)    Unknown   Atropine Other (See Comments)    Heart racing   Iodine Other (See Comments)     Unknown   Shellfish Allergy Other (See Comments)    Unknown   Statins     "didn't feel right"      Review of Systems  Constitutional: Negative.   HENT: Negative.    Eyes: Negative.   Respiratory: Negative.    Cardiovascular: Negative.   Gastrointestinal: Negative.   Endocrine: Negative.   Genitourinary: Negative.   Musculoskeletal: Negative.   Skin: Negative.   Allergic/Immunologic: Negative.   Neurological: Negative.   Hematological: Negative.   Psychiatric/Behavioral: Negative.       Today's Vitals   12/19/21 1027  BP: 112/68  Pulse: 78  Temp: 98.4 F (36.9 C)  TempSrc: Oral  Weight: 147 lb 9.6 oz (67 kg)  Height: 5' 3" (1.6 m)  PainSc: 5   PainLoc: Leg   Body mass index is 26.15 kg/m.  Wt Readings from Last 3 Encounters:  01/02/22 147 lb (66.7 kg)  12/19/21 147 lb 9.6 oz (67 kg)  09/03/21 147 lb (66.7 kg)     Objective:  Physical Exam Vitals reviewed.  Constitutional:      General: She is not in acute distress.    Appearance: Normal appearance.  Cardiovascular:     Rate and Rhythm: Normal rate and regular rhythm.     Pulses: Normal pulses.     Heart sounds: Normal heart sounds. No murmur heard. Pulmonary:     Effort: Pulmonary effort is normal. No respiratory distress.     Breath sounds: Normal breath sounds. No wheezing.  Abdominal:     General: Abdomen is flat. Bowel sounds are normal. There is no distension.     Palpations: Abdomen is soft.     Tenderness: There is no abdominal tenderness.  Musculoskeletal:        General: No swelling, tenderness or deformity. Normal range of motion.     Cervical back: No tenderness.  Skin:    General: Skin is warm and dry.     Capillary Refill: Capillary refill takes less than 2 seconds.  Neurological:     General: No focal deficit present.     Mental Status: She is alert and oriented to person, place, and time.     Cranial Nerves: No cranial nerve deficit.     Motor: No weakness.  Psychiatric:         Mood and Affect: Mood normal.  Behavior: Behavior normal.        Thought Content: Thought content normal.        Judgment: Judgment normal.         Assessment And Plan:     1. Prediabetes Comments: Will recheck HgbA1c, continue to focus on healthy diet low in sugar and starches and exercise at least 150 minutes.  - Hemoglobin A1c  2. Essential hypertension Comments: Blood pressure is well controlled, continue current medications.  - BMP8+eGFR - hydrochlorothiazide (HYDRODIURIL) 25 MG tablet; Take 1 tablet (25 mg total) by mouth daily.  Dispense: 90 tablet; Refill: 2  3. Interstitial pulmonary disease ([place]) Comments: Continue follow up with Dr. Ardis Hughs.   4. Pain in other joint Comments: Will check for autoimmune causes.  - VITAMIN D 25 Hydroxy (Vit-D Deficiency, Fractures) - ANA, IFA (with reflex) - Autoimmune Profile  5. Left leg pain Comments: She has seen Ortho and his OV note indicates she has a small lateral meniscus tear and a degenerative medial tear of flap    Patient was given opportunity to ask questions. Patient verbalized understanding of the plan and was able to repeat key elements of the plan. All questions were answered to their satisfaction.  [name], FNP   I, [name], FNP, have reviewed all documentation for this visit. The documentation on 12/19/21 for the exam, diagnosis, procedures, and orders are all accurate and complete.   IF YOU HAVE BEEN REFERRED TO A SPECIALIST, IT MAY TAKE 1-2 WEEKS TO SCHEDULE/PROCESS THE REFERRAL. IF YOU HAVE NOT HEARD FROM US/SPECIALIST IN TWO WEEKS, PLEASE GIVE [place] A CALL AT [phone] X [phone].   THE PATIENT IS ENCOURAGED TO PRACTICE SOCIAL DISTANCING DUE TO THE COVID-19 PANDEMIC.

## 2021-12-20 LAB — ANTINUCLEAR ANTIBODIES, IFA: ANA Titer 1: NEGATIVE

## 2021-12-20 LAB — BMP8+EGFR
BUN/Creatinine Ratio: 23 (ref 12–28)
BUN: 18 mg/dL (ref 8–27)
CO2: 27 mmol/L (ref 20–29)
Calcium: 9.9 mg/dL (ref 8.7–10.3)
Chloride: 100 mmol/L (ref 96–106)
Creatinine, Ser: 0.8 mg/dL (ref 0.57–1.00)
Glucose: 105 mg/dL — ABNORMAL HIGH (ref 70–99)
Potassium: 4.2 mmol/L (ref 3.5–5.2)
Sodium: 139 mmol/L (ref 134–144)
eGFR: 77 mL/min/{1.73_m2} (ref >59)

## 2021-12-20 LAB — HEMOGLOBIN A1C
Est. average glucose Bld gHb Est-mCnc: 134 mg/dL
Hgb A1c MFr Bld: 6.3 % — ABNORMAL HIGH (ref 4.8–5.6)

## 2021-12-20 LAB — AUTOIMMUNE PROFILE
Anti Nuclear Antibody (ANA): NEGATIVE
Complement C3, Serum: 142 mg/dL (ref 82–167)
dsDNA Ab: <1 IU/mL (ref 0–9)

## 2021-12-20 LAB — VITAMIN D 25 HYDROXY (VIT D DEFICIENCY, FRACTURES): Vit D, 25-Hydroxy: 73 ng/mL (ref 30.0–100.0)

## 2022-01-02 ENCOUNTER — Ambulatory Visit: Payer: Medicare Other | Admitting: Gastroenterology

## 2022-01-02 ENCOUNTER — Telehealth: Payer: Medicare Other

## 2022-01-02 ENCOUNTER — Encounter: Payer: Self-pay | Admitting: Gastroenterology

## 2022-01-02 VITALS — BP 112/72 | HR 83 | Ht 63.0 in | Wt 147.0 lb

## 2022-01-02 DIAGNOSIS — K219 Gastro-esophageal reflux disease without esophagitis: Secondary | ICD-10-CM | POA: Diagnosis not present

## 2022-01-02 DIAGNOSIS — Z8719 Personal history of other diseases of the digestive system: Secondary | ICD-10-CM | POA: Diagnosis not present

## 2022-01-02 DIAGNOSIS — Z1211 Encounter for screening for malignant neoplasm of colon: Secondary | ICD-10-CM

## 2022-01-02 DIAGNOSIS — K2270 Barrett's esophagus without dysplasia: Secondary | ICD-10-CM

## 2022-01-02 DIAGNOSIS — R140 Abdominal distension (gaseous): Secondary | ICD-10-CM | POA: Diagnosis not present

## 2022-01-02 MED ORDER — NA SULFATE-K SULFATE-MG SULF 17.5-3.13-1.6 GM/177ML PO SOLN: 1.0000 | Freq: Once | ORAL | 0 refills | Status: AC

## 2022-01-02 NOTE — Progress Notes (Signed)
bloatn

## 2022-01-02 NOTE — Patient Instructions (Signed)
You can hold fibercon and continue probiotic.  You have been given a testing kit to check for small intestine bacterial overgrowth (SIBO) which is completed by a company named [facility]. Make sure to return your test in the mail using the return mailing label given to you along with the kit. Your demographic and insurance information have already been sent to the company and they should be in contact with you over the next 1-2 weeks regarding this test. [facility] will collect an upfront charge of $99.74 for commercial insurance plans and $209.74 is you are paying cash. Make sure to discuss with [facility] PRIOR to having the test to see if they have gotten information from your insurance company as to how much your testing will cost out of pocket, if any. Please keep in mind that you will be getting a call from phone number [phone] or a similar number. If you do not hear from them within this time frame, please call our office at [phone] or call [facility] directly at [phone].   You have been scheduled for an endoscopy and colonoscopy. Please follow the written instructions given to you at your visit today. Please pick up your prep supplies at the pharmacy within the next 1-3 days. If you use inhalers (even only as needed), please bring them with you on the day of your procedure.

## 2022-01-02 NOTE — Progress Notes (Signed)
GASTROENTEROLOGY OUTPATIENT CLINIC VISIT   Primary Care Provider [name], [address] [place] [address] [phone]  Patient Profile: Terri Barajas is a 76 y.o. female  with a pmh significant for HTN, Glaucoma, MVP, GERD, Barrett's esophagus (negative on recent 2020 EGD).  The patient presents to the [facility] for an evaluation and management of problem(s) noted below:  Problem List 1. Bloating   2. Abdominal distention   3. Gastroesophageal reflux disease without esophagitis   4. History of Barrett's esophagus   5. Colon cancer screening     History of Present Illness Please see prior notes for full details of HPI.  Interval History The patient returns to clinic for follow-up.  She is accompanied today by her husband.  Patient states that over the last few months she has experienced recurrent episodes of bloating and abdominal distention.  She was out of town, back in [place], and was able to see her previous GI physician office where she was told to stop her FiberCon and initiate probiotics.  Doing this has helped some of her symptoms but she continues to experience these episodes of significant abdominal distention and bloating and gas production that is not necessarily a result of food intake.  We had previously discussed consideration of SIBO breath testing but she has not completed that as of yet and she has not done an empiric round of SIBO treatment.  Her last endoscopy in 2020 did not show evidence of overt Barrett's esophagus but was said because she had prior endoscopies that had definitely showed Barrett's based on the prior endoscopy that we would perform at least 1 more 3-year EGD to also follow-up esophagitis.  She is willing to move forward with that.  She is due for colon cancer screening as well at this point in time and so she is willing to consider that this year also.  Her bowel habits are stable and she is  using the restroom on a daily or every other day basis.  She is able to afford her Dexilant therapy at this time as she is getting his mail order.  GI Review of Systems Positive as above Negative for dysphagia, odynophagia, nausea, vomiting, melena, hematochezia   Review of Systems General: Denies fevers/chills/unintentional weight loss Cardiovascular: Denies current chest pain Pulmonary: Denies shortness of breath Gastroenterological: See HPI Genitourinary: Denies darkened urine Hematological: Denies easy bruising Skin: Denies jaundice Psychological: Mood is stable   Medications Current Outpatient Medications  Medication Sig Dispense Refill   ALPRAZolam (XANAX) 0.5 MG tablet Take 0.5 tablets (0.25 mg total) by mouth daily as needed for anxiety. 45 tablet 0   aspirin 81 MG chewable tablet Chew 81 mg by mouth daily.     BIOTIN PO Take 1 tablet by mouth daily.      calcium carbonate (OSCAL) 1500 (600 Ca) MG TABS tablet Take 600 mg of elemental calcium by mouth daily.     Cholecalciferol (VITAMIN D3) 25 MCG (1000 UT) CAPS Take 1,000 Units by mouth daily.     diltiazem (CARDIZEM) 30 MG tablet TAKE 1 TABLET BY MOUTH  TWICE DAILY 180 tablet 3   dorzolamide-timolol (COSOPT) 22.3-6.8 MG/ML ophthalmic solution Apply 1 drop to eye in the morning and at bedtime.     esomeprazole (NEXIUM) 40 MG capsule TAKE 1 CAPSULE (40 MG TOTAL) BY MOUTH 2 (TWO) TIMES DAILY BEFORE A MEAL. 180 capsule 1   Evolocumab (REPATHA SURECLICK) 828 MG/ML SOAJ Inject 140 mg into  the skin every 14 (fourteen) days. 2 mL 11   fluticasone (FLONASE) 50 MCG/ACT nasal spray Place 2 sprays into both nostrils daily. 16 g 2   hydrochlorothiazide (HYDRODIURIL) 25 MG tablet Take 1 tablet (25 mg total) by mouth daily. 90 tablet 2   latanoprost (XALATAN) 0.005 % ophthalmic solution Place 1 drop into both eyes at bedtime.      Multiple Vitamins-Minerals (CENTRUM SILVER 50+WOMEN PO) Take 1 tablet by mouth daily.     Omega-3 Fatty Acids  (FISH OIL PO) Take 1 capsule by mouth daily.     polyethylene glycol (MIRALAX / GLYCOLAX) packet Take 17 g by mouth daily.     Probiotic Product (ALIGN PO) Take by mouth.     RHOPRESSA 0.02 % SOLN Apply 1 drop to eye daily.     No current facility-administered medications for this visit.   Allergies Allergies  Allergen Reactions   Atorvastatin     Myalgias    Atracurium & Derivatives Other (See Comments)    Unknown   Atropine Other (See Comments)    Heart racing   Iodine Other (See Comments)    Unknown   Shellfish Allergy Other (See Comments)    Unknown   Statins     "didn't feel right"    Histories Past Medical History:  Diagnosis Date   Anxiety    Arthritis    Complication of anesthesia    some type of sedation medication made her feel crazy   Dysrhythmia    Family history of adverse reaction to anesthesia    daughter has Malignant Hyperthermia hx -    GERD (gastroesophageal reflux disease)    Glaucoma    H/O: hysterectomy 1978   Heart disease    History of hiatal hernia    Hypertension    MVP (mitral valve prolapse)    Panic attacks    Pre-diabetes    Sciatica    Past Surgical History:  Procedure Laterality Date   APPENDECTOMY  1972   BACK SURGERY  07/14/2020   chipped disc   BIOPSY  07/06/2018   Procedure: BIOPSY;  Surgeon: Irving Copas., MD;  Location: [facility];  Service: Gastroenterology;;   CATARACT EXTRACTION  2013   [place] and 1978   COLONOSCOPY     ESOPHAGOGASTRODUODENOSCOPY (EGD) WITH PROPOFOL N/A 07/06/2018   Procedure: ESOPHAGOGASTRODUODENOSCOPY (EGD) WITH PROPOFOL;  Surgeon: Irving Copas., MD;  Location: [facility];  Service: Gastroenterology;  Laterality: N/A;   HAND SURGERY Bilateral    HEMORRHOID SURGERY  2018   S/P Hysterectomy   1987   Social History   Socioeconomic History   Marital status: Married    Spouse name: Not on file   Number of children: Not on file   Years of education: 12    Highest education level: Not on file  Occupational History   Occupation: retired  Tobacco Use   Smoking status: Never   Smokeless tobacco: Never  Vaping Use   Vaping Use: Never used  Substance and Sexual Activity   Alcohol use: Never   Drug use: Never   Sexual activity: Yes    Partners: Male  Other Topics Concern   Not on file  Social History Narrative   Not on file   Social Determinants of Health   Financial Resource Strain: Low Risk  (04/26/2021)   Overall Financial Resource Strain (CARDIA)    Difficulty of Paying Living Expenses: Not hard at all  Food Insecurity: No Food Insecurity (04/26/2021)  Hunger Vital Sign    Worried About Running Out of Food in the Last Year: Never true    Ran Out of Food in the Last Year: Never true  Transportation Needs: No Transportation Needs (04/26/2021)   PRAPARE - Hydrologist (Medical): No    Lack of Transportation (Non-Medical): No  Physical Activity: Sufficiently Active (04/26/2021)   Exercise Vital Sign    Days of Exercise per Week: 7 days    Minutes of Exercise per Session: 30 min  Stress: No Stress Concern Present (04/26/2021)   [place]    Feeling of Stress : Not at all  Social Connections: Not on file  Intimate Partner Violence: Not on file   Family History  Problem Relation Age of Onset   Pancreatic cancer Mother    Alcohol abuse Father    Asthma Sister    Hypertension Sister    Arthritis Sister    Diabetes Brother    Arthritis Brother    Hyperlipidemia Brother    Hypertension Brother    Glaucoma Brother    Colon polyps Brother    Arthritis Sister    Arthritis Sister    Prostate cancer Brother    Congenital heart disease Brother    Colon cancer Neg Hx    Esophageal cancer Neg Hx    Inflammatory bowel disease Neg Hx    Liver disease Neg Hx    Rectal cancer Neg Hx    Stomach cancer Neg Hx    I have reviewed her  medical, social, and family history in detail and updated the electronic medical record as necessary.    PHYSICAL EXAMINATION  BP 112/72   Pulse 83   Ht '5\' 3"'$  (1.6 m)   Wt 147 lb (66.7 kg)   LMP  (LMP Unknown)   BMI 26.04 kg/m  GEN: NAD, appears stated age, doesn't appear chronically ill, accompanied by husband PSYCH: Cooperative, without pressured speech EYE: Conjunctivae pink, sclerae anicteric ENT: MMM CV: Nontachycardic RESP: No audible wheezing GI: NABS, soft, ND, without rebound or guarding MSK/EXT: No lower extremity edema SKIN: No jaundice NEURO:  Alert & Oriented x 3, no focal deficits   REVIEW OF DATA  I reviewed the following data at the time of this encounter:  GI Procedures and Studies  No new relevant studies to review  Laboratory Studies  Reviewed in EPIC  Imaging Studies  No new studies   ASSESSMENT  Terri Barajas is a 76 y.o. female with a pmh significant for HTN, Glaucoma, MVP, GERD, Barrett's esophagus (negative on recent 2020 EGD).  The patient is seen today for a return visit for evaluation and management of:  1. Bloating   2. Abdominal distention   3. Gastroesophageal reflux disease without esophagitis   4. History of Barrett's esophagus   5. Colon cancer screening    The patient is hemodynamically stable.  Clinically she continues to have episodes of recurrent bloating and gas and abdominal distention.  We have previously considered SIBO breath testing and she is willing to move forward with that at this time.  She is holding [place] and using probiotics and seems to anecdotally being doing a little bit better with that.  She will continue that regimen for now.  Her GERD symptoms are stable on her current Dexilant therapy so we will not make any adjustments on that.  Her last endoscopy a few years ago it suggested some evidence of esophagitis  but Barrett's esophagus was not overtly seen.  As she has had previous Barrett's esophagus noted on  biopsies elsewhere we were planning a 3-year follow-up endoscopy and that is reasonable to pursue at this 3-year mark.  She is also due for colon cancer screening and we will move forward with colonoscopy for colon cancer screening and colon polyp evaluation.  Her previous CT scan suggested an area of decompressed colon but we want to make sure that nothing else is being missed even though symptomatically she is doing well.  As such we will do her EGD/colonoscopy this year.  She has a family history of malignant hyperthermia so her procedures need to be done in the hospital-based setting.  The risks and benefits of endoscopic evaluation were discussed with the patient; these include but are not limited to the risk of perforation, infection, bleeding, missed lesions, lack of diagnosis, severe illness requiring hospitalization, as well as anesthesia and sedation related illnesses.  The patient and/or family is agreeable to proceed.  All patient questions were answered to the best of my ability, and the patient agrees to the aforementioned plan of action with follow-up as indicated.   PLAN  Continue simethicone as needed Proceed with SIBO breath testing EGD for surveillance of previous Barrett's esophagus to be scheduled Colonoscopy for colon cancer screening due this year and to be scheduled Patient holding [place] for now but may need to consider restarting future [place] for current probiotics that she is doing (she will reach out to [place] with the exact type of probiotic she is taking via [place]) [place] at current dosing   Orders Placed This Encounter  Procedures   Procedural/ Surgical Case Request: COLONOSCOPY WITH PROPOFOL, ESOPHAGOGASTRODUODENOSCOPY (EGD) WITH PROPOFOL   Ambulatory referral to Gastroenterology    New Prescriptions   No medications on file   Modified Medications   No medications on file    Planned Follow Up: No follow-ups on file.   Total Time in Face-to-Face  and in Coordination of Care for patient including independent/personal interpretation/review of prior testing, medical history, examination, medication adjustment, communicating results with the patient directly, and documentation with the EHR is 25 minutes.  [name], MD [facility]oscopy Office # [phone]

## 2022-01-03 ENCOUNTER — Encounter: Payer: Self-pay | Admitting: Gastroenterology

## 2022-01-04 DIAGNOSIS — R140 Abdominal distension (gaseous): Secondary | ICD-10-CM | POA: Insufficient documentation

## 2022-01-04 DIAGNOSIS — Z1211 Encounter for screening for malignant neoplasm of colon: Secondary | ICD-10-CM | POA: Insufficient documentation

## 2022-01-06 ENCOUNTER — Encounter: Payer: Self-pay | Admitting: Nurse Practitioner

## 2022-01-08 DIAGNOSIS — M7052 Other bursitis of knee, left knee: Secondary | ICD-10-CM | POA: Diagnosis not present

## 2022-01-08 DIAGNOSIS — S83249A Other tear of medial meniscus, current injury, unspecified knee, initial encounter: Secondary | ICD-10-CM | POA: Insufficient documentation

## 2022-01-08 DIAGNOSIS — S83232A Complex tear of medial meniscus, current injury, left knee, initial encounter: Secondary | ICD-10-CM | POA: Diagnosis not present

## 2022-01-15 ENCOUNTER — Ambulatory Visit (INDEPENDENT_AMBULATORY_CARE_PROVIDER_SITE_OTHER): Payer: Medicare Other | Admitting: Nurse Practitioner

## 2022-01-15 ENCOUNTER — Encounter: Payer: Self-pay | Admitting: Nurse Practitioner

## 2022-01-15 VITALS — BP 128/74 | HR 98 | Temp 98.5°F | Ht 63.0 in | Wt 145.0 lb

## 2022-01-15 DIAGNOSIS — F419 Anxiety disorder, unspecified: Secondary | ICD-10-CM | POA: Diagnosis not present

## 2022-01-15 MED ORDER — ALPRAZOLAM 0.25 MG PO TABS: 0.2500 mg | ORAL_TABLET | Freq: Every day | ORAL | 0 refills | Status: AC | PRN

## 2022-01-15 NOTE — Patient Instructions (Signed)
Hypertension, Adult ?Hypertension is another name for high blood pressure. High blood pressure forces your heart to work harder to pump blood. This can cause problems over time. ?There are two numbers in a blood pressure reading. There is a top number (systolic) over a bottom number (diastolic). It is best to have a blood pressure that is below 120/80. ?What are the causes? ?The cause of this condition is not known. Some other conditions can lead to high blood pressure. ?What increases the risk? ?Some lifestyle factors can make you more likely to develop high blood pressure: ?Smoking. ?Not getting enough exercise or physical activity. ?Being overweight. ?Having too much fat, sugar, calories, or salt (sodium) in your diet. ?Drinking too much alcohol. ?Other risk factors include: ?Having any of these conditions: ?Heart disease. ?Diabetes. ?High cholesterol. ?Kidney disease. ?Obstructive sleep apnea. ?Having a family history of high blood pressure and high cholesterol. ?Age. The risk increases with age. ?Stress. ?What are the signs or symptoms? ?High blood pressure may not cause symptoms. Very high blood pressure (hypertensive crisis) may cause: ?Headache. ?Fast or uneven heartbeats (palpitations). ?Shortness of breath. ?Nosebleed. ?Vomiting or feeling like you may vomit (nauseous). ?Changes in how you see. ?Very bad chest pain. ?Feeling dizzy. ?Seizures. ?How is this treated? ?This condition is treated by making healthy lifestyle changes, such as: ?Eating healthy foods. ?Exercising more. ?Drinking less alcohol. ?Your doctor may prescribe medicine if lifestyle changes do not help enough and if: ?Your top number is above 130. ?Your bottom number is above 80. ?Your personal target blood pressure may vary. ?Follow these instructions at home: ?Eating and drinking ? ?If told, follow the DASH eating plan. To follow this plan: ?Fill one half of your plate at each meal with fruits and vegetables. ?Fill one fourth of your plate  at each meal with whole grains. Whole grains include whole-wheat pasta, brown rice, and whole-grain bread. ?Eat or drink low-fat dairy products, such as skim milk or low-fat yogurt. ?Fill one fourth of your plate at each meal with low-fat (lean) proteins. Low-fat proteins include fish, chicken without skin, eggs, beans, and tofu. ?Avoid fatty meat, cured and processed meat, or chicken with skin. ?Avoid pre-made or processed food. ?Limit the amount of salt in your diet to less than 1,500 mg each day. ?Do not drink alcohol if: ?Your doctor tells you not to drink. ?You are pregnant, may be pregnant, or are planning to become pregnant. ?If you drink alcohol: ?Limit how much you have to: ?0-1 drink a day for women. ?0-2 drinks a day for men. ?Know how much alcohol is in your drink. In the [place] one drink equals one 12 oz bottle of beer (355 mL), one 5 oz glass of wine (148 mL), or one 1? oz glass of hard liquor (44 mL). ?Lifestyle ? ?Work with your doctor to stay at a healthy weight or to lose weight. Ask your doctor what the best weight is for you. ?Get at least 30 minutes of exercise that causes your heart to beat faster (aerobic exercise) most days of the week. This may include walking, swimming, or biking. ?Get at least 30 minutes of exercise that strengthens your muscles (resistance exercise) at least 3 days a week. This may include lifting weights or doing Pilates. ?Do not smoke or use any products that contain nicotine or tobacco. If you need help quitting, ask your doctor. ?Check your blood pressure at home as told by your doctor. ?Keep all follow-up visits. ?Medicines ?Take over-the-counter and prescription medicines   only as told by your doctor. Follow directions carefully. ?Do not skip doses of blood pressure medicine. The medicine does not work as well if you skip doses. Skipping doses also puts you at risk for problems. ?Ask your doctor about side effects or reactions to medicines that you should watch  for. ?Contact a doctor if: ?You think you are having a reaction to the medicine you are taking. ?You have headaches that keep coming back. ?You feel dizzy. ?You have swelling in your ankles. ?You have trouble with your vision. ?Get help right away if: ?You get a very bad headache. ?You start to feel mixed up (confused). ?You feel weak or numb. ?You feel faint. ?You have very bad pain in your: ?Chest. ?Belly (abdomen). ?You vomit more than once. ?You have trouble breathing. ?These symptoms may be an emergency. Get help right away. Call 911. ?Do not wait to see if the symptoms will go away. ?Do not drive yourself to the hospital. ?Summary ?Hypertension is another name for high blood pressure. ?High blood pressure forces your heart to work harder to pump blood. ?For most people, a normal blood pressure is less than 120/80. ?Making healthy choices can help lower blood pressure. If your blood pressure does not get lower with healthy choices, you may need to take medicine. ?This information is not intended to replace advice given to you by your health care provider. Make sure you discuss any questions you have with your health care provider. ?Document Revised: 03/22/2021 Document Reviewed: 03/22/2021 ?Elsevier Patient Education ? 2023 Elsevier Inc. ? ?

## 2022-01-15 NOTE — Progress Notes (Signed)
I,Terri Barajas,acting as a Education administrator for [employer], FNP.,have documented all relevant documentation on the behalf of Terri Brine, FNP,as directed by  Terri Brine, FNP while in the presence of Terri Barajas, [employer].  Subjective:     Patient ID: Terri Barajas , female    DOB: 1945/11/28 , 76 y.o.   MRN: [id number]   Chief Complaint  Patient presents with   Hypertension    HPI  She is here today for evaluation for her Xanax refill. Had initially when in [place]. She has not been evaluated since that time.  She does not want to get the Shingrix vaccine today since she is having surgery in a week. She is taking approximately 1/2 tablet of 0.'5mg'$  (will take a 1/2 tablet) once a day.    Anxiety Presents for initial visit. Onset was more than 5 years ago. Symptoms include nervous/anxious behavior and restlessness. Patient reports no chest pain or insomnia. Exacerbated by: current health issues.   Her past medical history is significant for anxiety/panic attacks. There is no history of anemia, hyperthyroidism or suicide attempts. Past treatments include benzodiazephines. The treatment provided significant relief.     Past Medical History:  Diagnosis Date   Anxiety    Arthritis    Complication of anesthesia    some type of sedation medication made her feel crazy   Dysrhythmia    Family history of adverse reaction to anesthesia    daughter has Malignant Hyperthermia hx -    GERD (gastroesophageal reflux disease)    Glaucoma    H/O: hysterectomy 1978   Heart disease    History of hiatal hernia    Hypertension    MVP (mitral valve prolapse)    Panic attacks    Pre-diabetes    Sciatica      Family History  Problem Relation Age of Onset   Pancreatic cancer Mother    Alcohol abuse Father    Asthma Sister    Hypertension Sister    Arthritis Sister    Diabetes Brother    Arthritis Brother    Hyperlipidemia Brother    Hypertension Brother    Glaucoma Brother    Colon polyps  Brother    Arthritis Sister    Arthritis Sister    Prostate cancer Brother    Congenital heart disease Brother    Colon cancer Neg Hx    Esophageal cancer Neg Hx    Inflammatory bowel disease Neg Hx    Liver disease Neg Hx    Rectal cancer Neg Hx    Stomach cancer Neg Hx      Current Outpatient Medications:    aspirin 81 MG chewable tablet, Chew 81 mg by mouth daily., Disp: , Rfl:    BIOTIN PO, Take 1 tablet by mouth daily. , Disp: , Rfl:    calcium carbonate (OSCAL) 1500 (600 Ca) MG TABS tablet, Take 600 mg of elemental calcium by mouth daily., Disp: , Rfl:    Cholecalciferol (VITAMIN D3) 25 MCG (1000 UT) CAPS, Take 1,000 Units by mouth daily., Disp: , Rfl:    diltiazem (CARDIZEM) 30 MG tablet, TAKE 1 TABLET BY MOUTH  TWICE DAILY, Disp: 180 tablet, Rfl: 3   dorzolamide-timolol (COSOPT) 22.3-6.8 MG/ML ophthalmic solution, Apply 1 drop to eye in the morning and at bedtime., Disp: , Rfl:    esomeprazole (NEXIUM) 40 MG capsule, TAKE 1 CAPSULE (40 MG TOTAL) BY MOUTH 2 (TWO) TIMES DAILY BEFORE A MEAL., Disp: 180 capsule, Rfl: 1   Evolocumab (  REPATHA SURECLICK) 409 MG/ML SOAJ, Inject 140 mg into the skin every 14 (fourteen) days., Disp: 2 mL, Rfl: 11   fluticasone (FLONASE) 50 MCG/ACT nasal spray, Place 2 sprays into both nostrils daily., Disp: 16 g, Rfl: 2   hydrochlorothiazide (HYDRODIURIL) 25 MG tablet, Take 1 tablet (25 mg total) by mouth daily., Disp: 90 tablet, Rfl: 2   latanoprost (XALATAN) 0.005 % ophthalmic solution, Place 1 drop into both eyes at bedtime. , Disp: , Rfl:    Multiple Vitamins-Minerals (CENTRUM SILVER 50+WOMEN PO), Take 1 tablet by mouth daily., Disp: , Rfl:    Omega-3 Fatty Acids (FISH OIL PO), Take 1 capsule by mouth daily., Disp: , Rfl:    polyethylene glycol (MIRALAX / GLYCOLAX) packet, Take 17 g by mouth daily., Disp: , Rfl:    Probiotic Product (ALIGN PO), Take by mouth., Disp: , Rfl:    RHOPRESSA 0.02 % SOLN, Apply 1 drop to eye daily., Disp: , Rfl:     ALPRAZolam (XANAX) 0.25 MG tablet, Take 1 tablet (0.25 mg total) by mouth daily as needed for anxiety., Disp: 30 tablet, Rfl: 0   Allergies  Allergen Reactions   Atorvastatin     Myalgias    Atracurium & Derivatives Other (See Comments)    Unknown   Atropine Other (See Comments)    Heart racing   Iodine Other (See Comments)    Unknown   Shellfish Allergy Other (See Comments)    Unknown   Statins     "didn't feel right"      Review of Systems  Constitutional: Negative.   Respiratory: Negative.    Cardiovascular: Negative.  Negative for chest pain.  Gastrointestinal: Negative.   Neurological: Negative.   Psychiatric/Behavioral:  The patient is nervous/anxious. The patient does not have insomnia.      Today's Vitals   01/15/22 1545  BP: 128/74  Pulse: 98  Temp: 98.5 F (36.9 C)  TempSrc: Oral  Weight: 145 lb (65.8 kg)  Height: '5\' 3"'$  (1.6 m)   Body mass index is 25.69 kg/m.  BP Readings from Last 3 Encounters:  01/15/22 128/74  01/02/22 112/72  12/19/21 112/68    Objective:  Physical Exam Vitals reviewed.  Constitutional:      General: She is not in acute distress.    Appearance: Normal appearance.  Cardiovascular:     Rate and Rhythm: Normal rate and regular rhythm.     Pulses: Normal pulses.     Heart sounds: Normal heart sounds. No murmur heard. Pulmonary:     Effort: Pulmonary effort is normal. No respiratory distress.     Breath sounds: Normal breath sounds. No wheezing.  Abdominal:     General: Abdomen is flat. Bowel sounds are normal. There is no distension.     Palpations: Abdomen is soft.     Tenderness: There is no abdominal tenderness.  Musculoskeletal:     Cervical back: Tenderness (right sternocleidoid muscle tenderness) present.  Skin:    Capillary Refill: Capillary refill takes less than 2 seconds.  Neurological:     General: No focal deficit present.     Mental Status: She is alert and oriented to person, place, and time.     Cranial  Nerves: No cranial nerve deficit.     Motor: No weakness.  Psychiatric:        Mood and Affect: Mood normal.        Behavior: Behavior normal.        Thought Content: Thought content normal.  Judgment: Judgment normal.         Assessment And Plan:     1. Anxiety Comments: Refilled her Xanax however we had a discussion about continued use, if needs to continue on regular basis after all her procedures will consider maintenance med - ALPRAZolam (XANAX) 0.25 MG tablet; Take 1 tablet (0.25 mg total) by mouth daily as needed for anxiety.  Dispense: 30 tablet; Refill: 0    Patient was given opportunity to ask questions. Patient verbalized understanding of the plan and was able to repeat key elements of the plan. All questions were answered to their satisfaction.  Terri Brine, FNP   I, Terri Brine, FNP, have reviewed all documentation for this visit. The documentation on 01/15/22 for the exam, diagnosis, procedures, and orders are all accurate and complete.   IF YOU HAVE BEEN REFERRED TO A SPECIALIST, IT MAY TAKE 1-2 WEEKS TO SCHEDULE/PROCESS THE REFERRAL. IF YOU HAVE NOT HEARD FROM US/SPECIALIST IN TWO WEEKS, PLEASE GIVE [place] A CALL AT [phone] X [phone].   THE PATIENT IS ENCOURAGED TO PRACTICE SOCIAL DISTANCING DUE TO THE COVID-19 PANDEMIC.

## 2022-01-24 ENCOUNTER — Ambulatory Visit: Payer: Medicare Other | Admitting: Nurse Practitioner

## 2022-01-24 ENCOUNTER — Encounter: Payer: Self-pay | Admitting: Gastroenterology

## 2022-01-24 NOTE — Progress Notes (Signed)
[facility] SIBO breath test results  Increased hydrogen 42 ppm Increased methane 45 ppm Increase combined hydrogen/methane 87 (expected less than 15) Analysis suggest bacterial overgrowth is suspected and double peak fashion.  We will scan these results to the chart. My team will reach out to the patient and let her know that I would recommend Xifaxan therapy.  550 mg 3 times daily x 14 days.  [name], MD [facility] Office # [phone]

## 2022-01-25 ENCOUNTER — Other Ambulatory Visit: Payer: Self-pay | Admitting: Gastroenterology

## 2022-01-25 MED ORDER — RIFAXIMIN 550 MG PO TABS: 550.0000 mg | ORAL_TABLET | Freq: Three times a day (TID) | ORAL | 0 refills | Status: AC

## 2022-01-25 NOTE — Progress Notes (Signed)
The pt has been advised of the results and prescription sent to the pharmacy.  She will keep procedure on 9/18 and follow up in 3-4 months.

## 2022-01-28 ENCOUNTER — Telehealth: Payer: Self-pay | Admitting: Pharmacy Technician

## 2022-01-28 ENCOUNTER — Other Ambulatory Visit (HOSPITAL_COMMUNITY): Payer: Self-pay

## 2022-01-28 DIAGNOSIS — M65862 Other synovitis and tenosynovitis, left lower leg: Secondary | ICD-10-CM | POA: Diagnosis not present

## 2022-01-28 DIAGNOSIS — G4733 Obstructive sleep apnea (adult) (pediatric): Secondary | ICD-10-CM | POA: Diagnosis not present

## 2022-01-28 DIAGNOSIS — S83232A Complex tear of medial meniscus, current injury, left knee, initial encounter: Secondary | ICD-10-CM | POA: Diagnosis not present

## 2022-01-28 DIAGNOSIS — G8918 Other acute postprocedural pain: Secondary | ICD-10-CM | POA: Diagnosis not present

## 2022-01-28 DIAGNOSIS — M94262 Chondromalacia, left knee: Secondary | ICD-10-CM | POA: Diagnosis not present

## 2022-01-28 DIAGNOSIS — S83262A Peripheral tear of lateral meniscus, current injury, left knee, initial encounter: Secondary | ICD-10-CM | POA: Diagnosis not present

## 2022-01-28 HISTORY — PX: KNEE CARTILAGE SURGERY: SHX688

## 2022-01-28 NOTE — Telephone Encounter (Signed)
Patient Advocate Encounter  Received notification from [facility] that prior authorization for XIFAXAN '550MG'$  is required.   PA submitted on 8.14.23 Key [id number] Status is pending    Luciano Cutter, CPhT Patient Advocate Phone: [phone]

## 2022-01-29 NOTE — Telephone Encounter (Signed)
Patient Advocate Encounter  Prior Authorization for Xifaxan '550MG'$  has been approved.   Effective through 06/16/2022  Clista Bernhardt, CPhT Rx Patient Advocate Specialist Phone: [phone]

## 2022-01-31 DIAGNOSIS — K219 Gastro-esophageal reflux disease without esophagitis: Secondary | ICD-10-CM | POA: Diagnosis not present

## 2022-01-31 DIAGNOSIS — R09A2 Foreign body sensation, throat: Secondary | ICD-10-CM | POA: Insufficient documentation

## 2022-01-31 DIAGNOSIS — R0989 Other specified symptoms and signs involving the circulatory and respiratory systems: Secondary | ICD-10-CM | POA: Diagnosis not present

## 2022-02-01 ENCOUNTER — Telehealth: Payer: Self-pay | Admitting: Gastroenterology

## 2022-02-01 NOTE — Telephone Encounter (Signed)
Called [facility] pharmacy and spoke with pharmacist regarding xifaxan prescription.Pharmacist states they have the prescription but even after the PA it is over 600 dollars. Please advise Dr. Rush Landmark.

## 2022-02-01 NOTE — Telephone Encounter (Signed)
PT was approved for Xifaxan on 01/29/2022. Went to pick up RX yesterday and was told it hadn't been called in. Please advise.

## 2022-02-04 ENCOUNTER — Encounter: Payer: Self-pay | Admitting: Gastroenterology

## 2022-02-04 DIAGNOSIS — M25562 Pain in left knee: Secondary | ICD-10-CM | POA: Diagnosis not present

## 2022-02-04 DIAGNOSIS — M25662 Stiffness of left knee, not elsewhere classified: Secondary | ICD-10-CM | POA: Diagnosis not present

## 2022-02-05 NOTE — Telephone Encounter (Signed)
Patient states she has already picked up the prescription for Xifaxan and started the medication. All questions answered.

## 2022-02-05 NOTE — Telephone Encounter (Signed)
Quite a cost. If she wants to trial Augmentin 875 mg twice daily x2 weeks I think that is a reasonable treatment and I do not see any allergy contraindications for this to be done. Would likely not be as effective but certainly can be trialed for her. Let me know what she decides. Thanks. GM

## 2022-02-08 ENCOUNTER — Telehealth: Payer: Self-pay | Admitting: Gastroenterology

## 2022-02-08 DIAGNOSIS — M25562 Pain in left knee: Secondary | ICD-10-CM | POA: Diagnosis not present

## 2022-02-08 DIAGNOSIS — M25662 Stiffness of left knee, not elsewhere classified: Secondary | ICD-10-CM | POA: Diagnosis not present

## 2022-02-08 NOTE — Telephone Encounter (Signed)
Chart reviewed.  Xifaxan was appropriately prescribed for abdominal bloating with concern for bacterial overgrowth.  I do not think the bleeding is related to the Xifaxan.  I recommend completing course, using probiotic now and continue at least 4 weeks beyond completion of Xifaxan course (was taking probiotic at the time of her last appointment).  Plan to proceed with EGD/colonoscopy as scheduled.    Of course, if continued bleeding or increase in volume of bleeding, or any other related concerns to include abdominal pain, decreased p.o. intake/tolerance chest pain, lightheadedness, shortness of breath, etc., to proceed to ER over the weekend.

## 2022-02-08 NOTE — Telephone Encounter (Signed)
I have spoken to the pt and advised her that she should continue the xifaxan and also probiotic for another 4 weeks after completion of xifaxan.  She will keep procedure appt as planned. She will go to the ED if symptoms worsen or she develops worsening bleeding or other symptoms. We did discuss light headedness, SOB, abd pain etc.  The pt has been advised of the information and verbalized understanding.

## 2022-02-08 NOTE — Telephone Encounter (Signed)
Dr Bryan Lemma this is a pt of Dr Rush Landmark who was last seen on 7/19 for abd bloating.  She was started on xifaxan on 8/18. She states she now has bright red blood in stools that began yesterday.  She does still have some bloating.  She believes the xifaxan has caused her symptoms.  Dr Bryan Lemma can you please review asa DOD?  Thank you

## 2022-02-08 NOTE — Telephone Encounter (Signed)
Patient states the medication xifaxan has caused her to have bloody stools, and stomach cramps. Please advise patient on what she should do. Thanks

## 2022-02-12 DIAGNOSIS — M25562 Pain in left knee: Secondary | ICD-10-CM | POA: Diagnosis not present

## 2022-02-12 DIAGNOSIS — M25662 Stiffness of left knee, not elsewhere classified: Secondary | ICD-10-CM | POA: Diagnosis not present

## 2022-02-15 DIAGNOSIS — M25562 Pain in left knee: Secondary | ICD-10-CM | POA: Diagnosis not present

## 2022-02-15 DIAGNOSIS — M25662 Stiffness of left knee, not elsewhere classified: Secondary | ICD-10-CM | POA: Diagnosis not present

## 2022-02-19 ENCOUNTER — Ambulatory Visit (HOSPITAL_COMMUNITY)
Admission: RE | Admit: 2022-02-19 | Discharge: 2022-02-19 | Disposition: A | Discharge status: Home / Self Care | Payer: Medicare Other | Source: Ambulatory Visit | Attending: Cardiology | Admitting: Cardiology

## 2022-02-19 ENCOUNTER — Other Ambulatory Visit (HOSPITAL_COMMUNITY): Payer: Self-pay | Admitting: Orthopedic Surgery

## 2022-02-19 DIAGNOSIS — M25662 Stiffness of left knee, not elsewhere classified: Secondary | ICD-10-CM | POA: Diagnosis not present

## 2022-02-19 DIAGNOSIS — M79605 Pain in left leg: Secondary | ICD-10-CM

## 2022-02-19 DIAGNOSIS — M25562 Pain in left knee: Secondary | ICD-10-CM | POA: Diagnosis not present

## 2022-02-21 ENCOUNTER — Encounter (HOSPITAL_BASED_OUTPATIENT_CLINIC_OR_DEPARTMENT_OTHER): Payer: Self-pay | Admitting: Cardiovascular Disease

## 2022-02-21 ENCOUNTER — Other Ambulatory Visit: Payer: Self-pay | Admitting: Nurse Practitioner

## 2022-02-21 DIAGNOSIS — Z1231 Encounter for screening mammogram for malignant neoplasm of breast: Secondary | ICD-10-CM

## 2022-02-21 MED ORDER — REPATHA SURECLICK 140 MG/ML ~~LOC~~ SOAJ: 140.0000 mg | SUBCUTANEOUS | 11 refills | Status: DC

## 2022-02-22 DIAGNOSIS — M25662 Stiffness of left knee, not elsewhere classified: Secondary | ICD-10-CM | POA: Diagnosis not present

## 2022-02-22 DIAGNOSIS — M25562 Pain in left knee: Secondary | ICD-10-CM | POA: Diagnosis not present

## 2022-02-25 ENCOUNTER — Encounter (HOSPITAL_COMMUNITY): Payer: Self-pay | Admitting: Gastroenterology

## 2022-02-26 ENCOUNTER — Telehealth (HOSPITAL_BASED_OUTPATIENT_CLINIC_OR_DEPARTMENT_OTHER): Payer: Self-pay | Admitting: Cardiovascular Disease

## 2022-02-26 DIAGNOSIS — M25562 Pain in left knee: Secondary | ICD-10-CM | POA: Diagnosis not present

## 2022-02-26 DIAGNOSIS — M25662 Stiffness of left knee, not elsewhere classified: Secondary | ICD-10-CM | POA: Diagnosis not present

## 2022-02-26 NOTE — Progress Notes (Signed)
Attempted to obtain medical history via telephone, unable to reach at this time. HIPAA compliant voicemail message left requesting return call to pre surgical testing department. 

## 2022-02-26 NOTE — Telephone Encounter (Signed)
Pt c/o medication issue:  1. Name of Medication:  Evolocumab (REPATHA SURECLICK) 568 MG/ML SOAJ  2. How are you currently taking this medication (dosage and times per day)? N/A  3. Are you having a reaction (difficulty breathing--STAT)? N/A  4. What is your medication issue? Robert with [employer] is calling requesting verbal orders to send out a new refill on Repatha due to the one sent being damaged. Please advise.

## 2022-02-26 NOTE — Telephone Encounter (Signed)
Verbal order/ok given to Morey Hummingbird D

## 2022-03-01 DIAGNOSIS — M25662 Stiffness of left knee, not elsewhere classified: Secondary | ICD-10-CM | POA: Diagnosis not present

## 2022-03-01 DIAGNOSIS — M25562 Pain in left knee: Secondary | ICD-10-CM | POA: Diagnosis not present

## 2022-03-03 NOTE — Anesthesia Preprocedure Evaluation (Signed)
Anesthesia Evaluation  Patient identified by MRN, date of birth, ID band Patient awake    Reviewed: Allergy & Precautions, NPO status , Patient's Chart, lab work & pertinent test results  History of Anesthesia Complications (+) MALIGNANT HYPERTHERMIA, Family history of anesthesia reaction and history of anesthetic complications (daughter with biopsy confirmed MH)  Airway Mallampati: II  TM Distance: >3 FB Neck ROM: Full    Dental   Pulmonary sleep apnea ,  Interstitial pulmonary disease   Pulmonary exam normal        Cardiovascular hypertension, Normal cardiovascular exam  Echo 10/14/17: EF 60-65%, no RWMA, normal DF, trivial MR, mild TR, normal RVSF  normal, low risk stress test 09/02/18   Neuro/Psych Anxiety negative neurological ROS     GI/Hepatic Neg liver ROS, hiatal hernia, GERD  ,  Endo/Other  negative endocrine ROS  Renal/GU negative Renal ROS  negative genitourinary   Musculoskeletal negative musculoskeletal ROS (+)   Abdominal   Peds  Hematology negative hematology ROS (+)   Anesthesia Other Findings   Reproductive/Obstetrics                            Anesthesia Physical Anesthesia Plan  ASA: 2  Anesthesia Plan: MAC   Post-op Pain Management: Minimal or no pain anticipated   Induction: Intravenous  PONV Risk Score and Plan: 2 and Propofol infusion, TIVA and Treatment may vary due to age or medical condition  Airway Management Planned: Natural Airway, Nasal Cannula and Simple Face Mask  Additional Equipment: None  Intra-op Plan:   Post-operative Plan:   Informed Consent: I have reviewed the patients History and Physical, chart, labs and discussed the procedure including the risks, benefits and alternatives for the proposed anesthesia with the patient or authorized representative who has indicated his/her understanding and acceptance.       Plan Discussed with:    Anesthesia Plan Comments: (Plan for non-triggering anesthetic with propofol-only sedation, avoidance of succinylcholine/volatile agents in case of need for conversion to GA. )       Anesthesia Quick Evaluation

## 2022-03-04 ENCOUNTER — Ambulatory Visit (HOSPITAL_COMMUNITY): Payer: Medicare Other | Admitting: Anesthesiology

## 2022-03-04 ENCOUNTER — Ambulatory Visit (HOSPITAL_BASED_OUTPATIENT_CLINIC_OR_DEPARTMENT_OTHER): Payer: Medicare Other | Admitting: Anesthesiology

## 2022-03-04 ENCOUNTER — Encounter (HOSPITAL_COMMUNITY)
Admission: RE | Disposition: A | Discharge status: Home / Self Care | Payer: Self-pay | Source: Home / Self Care | Attending: Gastroenterology

## 2022-03-04 ENCOUNTER — Ambulatory Visit (HOSPITAL_COMMUNITY)
Admission: RE | Admit: 2022-03-04 | Discharge: 2022-03-04 | Disposition: A | Discharge status: Home / Self Care | Payer: Medicare Other | Attending: Gastroenterology | Admitting: Gastroenterology

## 2022-03-04 ENCOUNTER — Encounter (HOSPITAL_COMMUNITY): Payer: Self-pay | Admitting: Gastroenterology

## 2022-03-04 ENCOUNTER — Other Ambulatory Visit: Payer: Self-pay

## 2022-03-04 DIAGNOSIS — K2270 Barrett's esophagus without dysplasia: Secondary | ICD-10-CM | POA: Diagnosis not present

## 2022-03-04 DIAGNOSIS — K641 Second degree hemorrhoids: Secondary | ICD-10-CM | POA: Insufficient documentation

## 2022-03-04 DIAGNOSIS — K2950 Unspecified chronic gastritis without bleeding: Secondary | ICD-10-CM | POA: Diagnosis not present

## 2022-03-04 DIAGNOSIS — R1310 Dysphagia, unspecified: Secondary | ICD-10-CM | POA: Diagnosis not present

## 2022-03-04 DIAGNOSIS — G4730 Sleep apnea, unspecified: Secondary | ICD-10-CM | POA: Diagnosis not present

## 2022-03-04 DIAGNOSIS — K635 Polyp of colon: Secondary | ICD-10-CM | POA: Diagnosis not present

## 2022-03-04 DIAGNOSIS — K3189 Other diseases of stomach and duodenum: Secondary | ICD-10-CM | POA: Insufficient documentation

## 2022-03-04 DIAGNOSIS — K2289 Other specified disease of esophagus: Secondary | ICD-10-CM

## 2022-03-04 DIAGNOSIS — R140 Abdominal distension (gaseous): Secondary | ICD-10-CM | POA: Diagnosis not present

## 2022-03-04 DIAGNOSIS — K219 Gastro-esophageal reflux disease without esophagitis: Secondary | ICD-10-CM | POA: Diagnosis not present

## 2022-03-04 DIAGNOSIS — Z1212 Encounter for screening for malignant neoplasm of rectum: Secondary | ICD-10-CM | POA: Diagnosis not present

## 2022-03-04 DIAGNOSIS — I10 Essential (primary) hypertension: Secondary | ICD-10-CM | POA: Insufficient documentation

## 2022-03-04 DIAGNOSIS — K449 Diaphragmatic hernia without obstruction or gangrene: Secondary | ICD-10-CM

## 2022-03-04 DIAGNOSIS — K317 Polyp of stomach and duodenum: Secondary | ICD-10-CM | POA: Insufficient documentation

## 2022-03-04 DIAGNOSIS — Z8719 Personal history of other diseases of the digestive system: Secondary | ICD-10-CM

## 2022-03-04 DIAGNOSIS — Z1211 Encounter for screening for malignant neoplasm of colon: Secondary | ICD-10-CM | POA: Diagnosis not present

## 2022-03-04 DIAGNOSIS — K319 Disease of stomach and duodenum, unspecified: Secondary | ICD-10-CM | POA: Diagnosis not present

## 2022-03-04 DIAGNOSIS — K621 Rectal polyp: Secondary | ICD-10-CM | POA: Insufficient documentation

## 2022-03-04 DIAGNOSIS — R1084 Generalized abdominal pain: Secondary | ICD-10-CM | POA: Insufficient documentation

## 2022-03-04 HISTORY — PX: ESOPHAGOGASTRODUODENOSCOPY (EGD) WITH PROPOFOL: SHX5813

## 2022-03-04 HISTORY — PX: POLYPECTOMY: SHX5525

## 2022-03-04 HISTORY — PX: SAVORY DILATION: SHX5439

## 2022-03-04 HISTORY — PX: BIOPSY: SHX5522

## 2022-03-04 HISTORY — PX: COLONOSCOPY WITH PROPOFOL: SHX5780

## 2022-03-04 SURGERY — COLONOSCOPY WITH PROPOFOL
Anesthesia: Monitor Anesthesia Care

## 2022-03-04 MED ORDER — PHENYLEPHRINE HCL (PRESSORS) 10 MG/ML IV SOLN
INTRAVENOUS | Status: AC
  Filled 2022-03-04: qty 1

## 2022-03-04 MED ORDER — LACTATED RINGERS IV SOLN
INTRAVENOUS | Status: AC | PRN
  Administered 2022-03-04: 1000 mL via INTRAVENOUS

## 2022-03-04 MED ORDER — SODIUM CHLORIDE 0.9 % IV SOLN: INTRAVENOUS | Status: DC

## 2022-03-04 MED ORDER — LACTATED RINGERS IV SOLN: INTRAVENOUS | Status: DC

## 2022-03-04 MED ORDER — PROPOFOL 500 MG/50ML IV EMUL
INTRAVENOUS | Status: AC
  Filled 2022-03-04: qty 200

## 2022-03-04 MED ORDER — PROPOFOL 10 MG/ML IV BOLUS
INTRAVENOUS | Status: DC | PRN
  Administered 2022-03-04 (×2): 30 mg via INTRAVENOUS

## 2022-03-04 MED ORDER — PROPOFOL 500 MG/50ML IV EMUL
INTRAVENOUS | Status: DC | PRN
  Administered 2022-03-04: 100 ug/kg/min via INTRAVENOUS

## 2022-03-04 MED ORDER — LIDOCAINE 2% (20 MG/ML) 5 ML SYRINGE
INTRAMUSCULAR | Status: DC | PRN
  Administered 2022-03-04: 40 mg via INTRAVENOUS

## 2022-03-04 MED ORDER — ALBUMIN HUMAN 5 % IV SOLN
INTRAVENOUS | Status: AC
  Filled 2022-03-04: qty 750

## 2022-03-04 MED ORDER — PHENYLEPHRINE HCL-NACL 20-0.9 MG/250ML-% IV SOLN
INTRAVENOUS | Status: DC | PRN
  Administered 2022-03-04: 25 ug/min via INTRAVENOUS

## 2022-03-04 MED ORDER — PROPOFOL 10 MG/ML IV BOLUS
INTRAVENOUS | Status: AC
  Filled 2022-03-04: qty 20

## 2022-03-04 MED ORDER — PROPOFOL 1000 MG/100ML IV EMUL
INTRAVENOUS | Status: AC
  Filled 2022-03-04: qty 300

## 2022-03-04 SURGICAL SUPPLY — 25 items

## 2022-03-04 NOTE — Anesthesia Postprocedure Evaluation (Signed)
Anesthesia Post Note  Patient: [name]  Procedure(s) Performed: COLONOSCOPY WITH PROPOFOL ESOPHAGOGASTRODUODENOSCOPY (EGD) WITH PROPOFOL BIOPSY SAVORY DILATION POLYPECTOMY     Patient location during evaluation: Endoscopy Anesthesia Type: MAC Level of consciousness: awake and alert Pain management: pain level controlled Vital Signs Assessment: post-procedure vital signs reviewed and stable Respiratory status: spontaneous breathing, nonlabored ventilation and respiratory function stable Cardiovascular status: blood pressure returned to baseline and stable Postop Assessment: no apparent nausea or vomiting Anesthetic complications: no   No notable events documented.  Last Vitals:  Vitals:   03/04/22 0850 03/04/22 0858  BP: 130/67 135/70  Pulse: 80   Resp: 15 12  Temp:    SpO2: 100%     Last Pain:  Vitals:   03/04/22 0858  TempSrc:   PainSc: 0-No pain                 [name]

## 2022-03-04 NOTE — Transfer of Care (Signed)
Immediate Anesthesia Transfer of Care Note  Patient: Terri Barajas  Procedure(s) Performed: COLONOSCOPY WITH PROPOFOL ESOPHAGOGASTRODUODENOSCOPY (EGD) WITH PROPOFOL BIOPSY SAVORY DILATION POLYPECTOMY  Patient Location: PACU and Endoscopy Unit  Anesthesia Type:MAC  Level of Consciousness: awake  Airway & Oxygen Therapy: Patient Spontanous Breathing and Patient connected to face mask oxygen  Post-op Assessment: Report given to RN and Post -op Vital signs reviewed and stable  Post vital signs: Reviewed and stable  Last Vitals:  Vitals Value Taken Time  BP 124/52 03/04/22 0834  Temp    Pulse 85 03/04/22 0835  Resp 18 03/04/22 0835  SpO2 100 % 03/04/22 0835  Vitals shown include unvalidated device data.  Last Pain:  Vitals:   03/04/22 1638  TempSrc: Tympanic  PainSc: 0-No pain         Complications: No notable events documented.

## 2022-03-04 NOTE — Discharge Instructions (Signed)
YOU HAD AN ENDOSCOPIC PROCEDURE TODAY: Refer to the procedure report and other information in the discharge instructions given to you for any specific questions about what was found during the examination. If this information does not answer your questions, please call [place] office at [phone] to clarify.  ° °YOU SHOULD EXPECT: Some feelings of bloating in the abdomen. Passage of more gas than usual. Walking can help get rid of the air that was put into your GI tract during the procedure and reduce the bloating. If you had a lower endoscopy (such as a colonoscopy or flexible sigmoidoscopy) you may notice spotting of blood in your stool or on the toilet paper. Some abdominal soreness may be present for a day or two, also. ° °DIET: Your first meal following the procedure should be a light meal and then it is ok to progress to your normal diet. A half-sandwich or bowl of soup is an example of a good first meal. Heavy or fried foods are harder to digest and may make you feel nauseous or bloated. Drink plenty of fluids but you should avoid alcoholic beverages for 24 hours. If you had a esophageal dilation, please see attached instructions for diet.   ° °ACTIVITY: Your care partner should take you home directly after the procedure. You should plan to take it easy, moving slowly for the rest of the day. You can resume normal activity the day after the procedure however YOU SHOULD NOT DRIVE, use power tools, machinery or perform tasks that involve climbing or major physical exertion for 24 hours (because of the sedation medicines used during the test).  ° °SYMPTOMS TO REPORT IMMEDIATELY: °A gastroenterologist can be reached at any hour. Please call [phone]  for any of the following symptoms:  °Following lower endoscopy (colonoscopy, flexible sigmoidoscopy) °Excessive amounts of blood in the stool  °Significant tenderness, worsening of abdominal pains  °Swelling of the abdomen that is new, acute  °Fever of 100° or  higher  °Following upper endoscopy (EGD, EUS, ERCP, esophageal dilation) °Vomiting of blood or coffee ground material  °New, significant abdominal pain  °New, significant chest pain or pain under the shoulder blades  °Painful or persistently difficult swallowing  °New shortness of breath  °Black, tarry-looking or red, bloody stools ° °FOLLOW UP:  °If any biopsies were taken you will be contacted by phone or by letter within the next 1-3 weeks. Call [phone]  if you have not heard about the biopsies in 3 weeks.  °Please also call with any specific questions about appointments or follow up tests. ° °

## 2022-03-04 NOTE — Op Note (Signed)
[facility] Patient Name: Terri Barajas Procedure Date: 03/04/2022 MRN: [id number] Attending MD: [name] , MD Date of Birth: 21-Dec-1945 CSN: [id number] Age: 76 Admit Type: Outpatient Procedure:                Upper GI endoscopy Indications:              Generalized abdominal distress, Dysphagia,                            Follow-up of Barrett's esophagus, Abdominal                            distention, Abdominal bloating Providers:                [name], MD, Burtis Junes, RN, William Dalton, Technician Referring MD:             [name] Medicines:                Monitored Anesthesia Care Complications:            No immediate complications. Estimated Blood Loss:     Estimated blood loss was minimal. Procedure:                Pre-Anesthesia Assessment:                           - Prior to the procedure, a History and Physical                            was performed, and patient medications and                            allergies were reviewed. [name] tolerance of                            previous anesthesia was also reviewed. The risks                            and benefits of the procedure and the sedation                            options and risks were discussed with the patient.                            All questions were answered, and informed consent                            was obtained. Prior Anticoagulants: The patient has                            taken no previous anticoagulant or antiplatelet                            agents. ASA Grade Assessment: III -  A patient with                            severe systemic disease. After reviewing the risks                            and benefits, the patient was deemed in                            satisfactory condition to undergo the procedure.                           After obtaining informed consent, the endoscope was                             passed under direct vision. Throughout the                            procedure, the patient's blood pressure, pulse, and                            oxygen saturations were monitored continuously. The                            [id number]-H190 ([id number] Olympus endoscope was introduced                            through the mouth, and advanced to the second part                            of duodenum. The upper GI endoscopy was                            accomplished without difficulty. The patient                            tolerated the procedure. Scope In: Scope Out: Findings:      No gross lesions were noted in the entire esophagus.      The Z-line was irregular and was found 36 cm from the incisors.      After the rest of the EGD was completed a guidewire was placed and the       scope was withdrawn. Dilation was performed in the esophagus with a       Savary dilator with mild resistance at 18 mm. The dilation site was       examined following endoscope reinsertion and showed no change.      A 1 cm hiatal hernia was present.      Multiple 2 to 10 mm semi-sessile polyps were found in the gastric fundus       and in the gastric body. These have been previously biopsied as fundic       gland polyps and were not rebiopsied.      Striped mildly erythematous mucosa without bleeding was found in the       gastric antrum.      A localized thickened gastric fold  or possible gastic polyp were found       in the prepyloric region of the stomach. Biopsies were taken with a cold       forceps for histology to rule out dysplasia.      No other gross lesions were noted in the entire examined stomach.       Biopsies were taken with a cold forceps for histology and Helicobacter       pylori testing.      No gross lesions were noted in the duodenal bulb, in the first portion       of the duodenum and in the second portion of the duodenum. Previously       biopsied and negative for enteropathy/celiac  sprue not rebiopsied. Impression:               - No gross lesions in esophagus. Z-line irregular,                            36 cm from the incisors.                           - 1 cm hiatal hernia.                           - Savory dilation performed in the esophagus.                           - Multiple gastric polyps - fundic gland.                           - Enlarged gastric fold versus polyp - biopsied.                           - Erythematous mucosa in the antrum. No other gross                            lesions in the stomach. Biopsied.                           - No gross lesions in the duodenal bulb, in the                            first portion of the duodenum and in the second                            portion of the duodenum. Moderate Sedation:      Not Applicable - Patient had care per Anesthesia. Recommendation:           - Proceed to scheduled colonoscopy.                           - Continue present medications.                           - Await pathology results.                           -  If there is evidence of dysplasia in the                            thickened gastric fold/polyp, will need to consider                            endoscopic resection. It is not clear to me that                            this is causing a obstructive pattern for her at                            this time.                           - The findings and recommendations were discussed                            with the patient.                           - The findings and recommendations were discussed                            with the patient's family. Procedure Code(s):        --- Professional ---                           [phone], Esophagogastroduodenoscopy, flexible,                            transoral; with biopsy, single or multiple Diagnosis Code(s):        --- Professional ---                           K22.70, Barrett's esophagus without dysplasia                            K22.8, Other specified diseases of esophagus                           K44.9, Diaphragmatic hernia without obstruction or                            gangrene                           K31.7, Polyp of stomach and duodenum                           K31.89, Other diseases of stomach and duodenum                           R10.84, Generalized abdominal pain  R13.10, Dysphagia, unspecified                           R14.0, Abdominal distension (gaseous) CPT copyright 2019 American Medical Association. All rights reserved. The codes documented in this report are preliminary and upon coder review may  be revised to meet current compliance requirements. [name], MD 03/04/2022 8:39:34 AM Number of Addenda: 0

## 2022-03-04 NOTE — Anesthesia Procedure Notes (Signed)
Date/Time: 03/04/2022 7:36 AM  Performed by: Cynda Familia, CRNAPre-anesthesia Checklist: Emergency Drugs available, Patient identified, Suction available, Patient being monitored and Timeout performed Oxygen Delivery Method: Simple face mask Placement Confirmation: positive ETCO2 and breath sounds checked- equal and bilateral Dental Injury: Teeth and Oropharynx as per pre-operative assessment  Comments: Pt reports TMJ- bite block after deep sedation by RN as per patient request

## 2022-03-04 NOTE — H&P (Signed)
GASTROENTEROLOGY PROCEDURE H&P NOTE   Primary Care Physician: [name], FNP  HPI: Terri Barajas is a 76 y.o. female who presents for EGD/colonoscopy for evaluation of bloating/distention/dysphagia/history of Barrett's and colon cancer screening.  Past Medical History:  Diagnosis Date   Anxiety    Arthritis    Complication of anesthesia    some type of sedation medication made her feel crazy   Dysrhythmia    Family history of adverse reaction to anesthesia    daughter has Malignant Hyperthermia hx -    GERD (gastroesophageal reflux disease)    Glaucoma    H/O: hysterectomy 1978   Heart disease    History of hiatal hernia    Hypertension    MVP (mitral valve prolapse)    Panic attacks    Pre-diabetes    Sciatica    Past Surgical History:  Procedure Laterality Date   APPENDECTOMY  1972   BACK SURGERY  07/14/2020   chipped disc   BIOPSY  07/06/2018   Procedure: BIOPSY;  Surgeon: Irving Copas., MD;  Location: [facility];  Service: Gastroenterology;;   CATARACT EXTRACTION  2013   [facility] and 1978   COLONOSCOPY     ESOPHAGOGASTRODUODENOSCOPY (EGD) WITH PROPOFOL N/A 07/06/2018   Procedure: ESOPHAGOGASTRODUODENOSCOPY (EGD) WITH PROPOFOL;  Surgeon: Irving Copas., MD;  Location: [facility];  Service: Gastroenterology;  Laterality: N/A;   HAND SURGERY Bilateral    HEMORRHOID SURGERY  2018   S/P Hysterectomy   1987   Current Facility-Administered Medications  Medication Dose Route Frequency Provider Last Rate Last Admin   0.9 %  sodium chloride infusion   Intravenous Continuous Mansouraty, Telford Nab., MD       lactated ringers infusion   Intravenous Continuous Mansouraty, Telford Nab., MD       lactated ringers infusion    Continuous PRN Mansouraty, Telford Nab., MD 10 mL/hr at 03/04/22 0717 1,000 mL at 03/04/22 0717    Current Facility-Administered Medications:    0.9 %  sodium chloride infusion, , Intravenous, Continuous,  Mansouraty, Telford Nab., MD   lactated ringers infusion, , Intravenous, Continuous, Mansouraty, Telford Nab., MD   lactated ringers infusion, , , Continuous PRN, Mansouraty, Telford Nab., MD, Last Rate: 10 mL/hr at 03/04/22 0717, 1,000 mL at 03/04/22 8144 Allergies  Allergen Reactions   Shellfish Allergy Anaphylaxis and Shortness Of Breath   Atorvastatin     Myalgias    Atracurium & Derivatives Other (See Comments)    Unknown   Atropine Other (See Comments)    Heart racing   Iodine Other (See Comments)    Unknown   Statins     "didn't feel right"    Family History  Problem Relation Age of Onset   Pancreatic cancer Mother    Alcohol abuse Father    Asthma Sister    Hypertension Sister    Arthritis Sister    Diabetes Brother    Arthritis Brother    Hyperlipidemia Brother    Hypertension Brother    Glaucoma Brother    Colon polyps Brother    Arthritis Sister    Arthritis Sister    Prostate cancer Brother    Congenital heart disease Brother    Colon cancer Neg Hx    Esophageal cancer Neg Hx    Inflammatory bowel disease Neg Hx    Liver disease Neg Hx    Rectal cancer Neg Hx    Stomach cancer Neg Hx    Social History  Socioeconomic History   Marital status: Married    Spouse name: Not on file   Number of children: Not on file   Years of education: 12   Highest education level: Not on file  Occupational History   Occupation: retired  Tobacco Use   Smoking status: Never   Smokeless tobacco: Never  Vaping Use   Vaping Use: Never used  Substance and Sexual Activity   Alcohol use: Never   Drug use: Never   Sexual activity: Yes    Partners: Male  Other Topics Concern   Not on file  Social History Narrative   Not on file   Social Determinants of Health   Financial Resource Strain: Low Risk  (04/26/2021)   Overall Financial Resource Strain (CARDIA)    Difficulty of Paying Living Expenses: Not hard at all  Food Insecurity: No Food Insecurity (04/26/2021)    Hunger Vital Sign    Worried About Running Out of Food in the Last Year: Never true    [place] in the Last Year: Never true  Transportation Needs: No Transportation Needs (04/26/2021)   PRAPARE - Hydrologist (Medical): No    Lack of Transportation (Non-Medical): No  Physical Activity: Sufficiently Active (04/26/2021)   Exercise Vital Sign    Days of Exercise per Week: 7 days    Minutes of Exercise per Session: 30 min  Stress: No Stress Concern Present (04/26/2021)   [place]    Feeling of Stress : Not at all  Social Connections: Not on file  Intimate Partner Violence: Not on file    Physical Exam: Today's Vitals   02/26/22 1251 03/04/22 0712  BP:  (!) 155/93  Pulse:  91  Resp:  16  Temp:  98.1 F (36.7 C)  TempSrc:  Tympanic  SpO2:  100%  Weight: 66.7 kg 66.7 kg  Height: '5\' 3"'$  (1.6 m) '5\' 3"'$  (1.6 m)  PainSc:  0-No pain   Body mass index is 26.05 kg/m. GEN: NAD EYE: Sclerae anicteric ENT: MMM CV: Non-tachycardic GI: Soft, NT/ND NEURO:  Alert & Oriented x 3  Lab Results: No results for input(s): "WBC", "HGB", "HCT", "PLT" in the last 72 hours. BMET No results for input(s): "NA", "K", "CL", "CO2", "GLUCOSE", "BUN", "CREATININE", "CALCIUM" in the last 72 hours. LFT No results for input(s): "PROT", "ALBUMIN", "AST", "ALT", "ALKPHOS", "BILITOT", "BILIDIR", "IBILI" in the last 72 hours. PT/INR No results for input(s): "LABPROT", "INR" in the last 72 hours.   Impression / Plan: This is a 76 y.o.female who presents for EGD/colonoscopy for evaluation of bloating/distention/dysphagia/history of Barrett's and colon cancer screening.  The risks and benefits of endoscopic evaluation/treatment were discussed with the patient and/or family; these include but are not limited to the risk of perforation, infection, bleeding, missed lesions, lack of diagnosis, severe illness  requiring hospitalization, as well as anesthesia and sedation related illnesses.  [name] history has been reviewed, patient examined, no change in status, and deemed stable for procedure.  The patient and/or family is agreeable to proceed.    [name], MD [facility] Office # [phone]

## 2022-03-04 NOTE — Op Note (Signed)
[facility] Patient Name: Terri Barajas Procedure Date: 03/04/2022 MRN: [id number] Attending MD: [name] , MD Date of Birth: 02-04-1946 CSN: [id number] Age: 76 Admit Type: Outpatient Procedure:                Colonoscopy Indications:              Screening for colorectal malignant neoplasm Providers:                [name], MD, Burtis Junes, RN, William Dalton, Technician Referring MD:             [name] Medicines:                Monitored Anesthesia Care Complications:            No immediate complications. Estimated Blood Loss:     Estimated blood loss was minimal. Procedure:                Pre-Anesthesia Assessment:                           - Prior to the procedure, a History and Physical                            was performed, and patient medications and                            allergies were reviewed. [name] tolerance of                            previous anesthesia was also reviewed. The risks                            and benefits of the procedure and the sedation                            options and risks were discussed with the patient.                            All questions were answered, and informed consent                            was obtained. Prior Anticoagulants: The patient has                            taken no previous anticoagulant or antiplatelet                            agents. ASA Grade Assessment: III - A patient with                            severe systemic disease. After reviewing the risks  and benefits, the patient was deemed in                            satisfactory condition to undergo the procedure.                           After obtaining informed consent, the colonoscope                            was passed under direct vision. Throughout the                            procedure, the patient's blood pressure, pulse, and                             oxygen saturations were monitored continuously. The                            [id number]-HQ190L ([id number] Olympus colonoscope was                            introduced through the anus and advanced to the the                            cecum, identified by appendiceal orifice and                            ileocecal valve. The colonoscopy was somewhat                            difficult due to significant looping. Successful                            completion of the procedure was aided by changing                            the patient's position, using manual pressure,                            straightening and shortening the scope to obtain                            bowel loop reduction and using scope torsion. Scope In: 8:06:42 AM Scope Out: 8:25:18 AM Scope Withdrawal Time: 0 hours 10 minutes 33 seconds  Total Procedure Duration: 0 hours 18 minutes 36 seconds  Findings:      The digital rectal exam findings include hemorrhoids. Pertinent       negatives include no palpable rectal lesions.      The colon (entire examined portion) revealed significantly excessive       looping and tortuosity.      Four sessile polyps were found in the rectum (2), recto-sigmoid colon       (1) and sigmoid colon (1). The polyps were 2 to 5 mm in size. These       polyps were removed with  a cold snare. Resection and retrieval were       complete.      Normal mucosa was found in the entire colon otherwise.      Non-bleeding non-thrombosed internal hemorrhoids were found during       retroflexion, during perianal exam and during digital exam. The       hemorrhoids were Grade II (internal hemorrhoids that prolapse but reduce       spontaneously). Impression:               - Hemorrhoids found on digital rectal exam.                           - There was significant looping and tortuosity of                            the colon.                           - Four, 2 to 5 mm polyps in the  rectum, at the                            recto-sigmoid colon and in the sigmoid colon,                            removed with a cold snare. Resected and retrieved.                           - Normal mucosa in the entire examined colon                            otherwise.                           - Non-bleeding non-thrombosed internal hemorrhoids. Moderate Sedation:      Not Applicable - Patient had care per Anesthesia. Recommendation:           - The patient will be observed post-procedure,                            until all discharge criteria are met.                           - Discharge patient to home.                           - Patient has a contact number available for                            emergencies. The signs and symptoms of potential                            delayed complications were discussed with the                            patient. Return to normal activities tomorrow.  Written discharge instructions were provided to the                            patient.                           - High fiber diet.                           - Continue present medications.                           - Await pathology results.                           - Repeat colonoscopy in 3/10/21/08 years for                            surveillance based on pathology results and                            findings of adenomatous tissue.                           - The findings and recommendations were discussed                            with the patient.                           - The findings and recommendations were discussed                            with the patient's family. Procedure Code(s):        --- Professional ---                           [phone], Colonoscopy, flexible; with removal of                            tumor(s), polyp(s), or other lesion(s) by snare                            technique Diagnosis Code(s):        --- Professional ---                            Z12.11, Encounter for screening for malignant                            neoplasm of colon                           K64.1, Second degree hemorrhoids                           K62.1, Rectal polyp  K63.5, Polyp of colon CPT copyright 2019 American Medical Association. All rights reserved. The codes documented in this report are preliminary and upon coder review may  be revised to meet current compliance requirements. [name], MD 03/04/2022 8:45:26 AM Number of Addenda: 0

## 2022-03-05 ENCOUNTER — Encounter (HOSPITAL_COMMUNITY): Payer: Self-pay | Admitting: Gastroenterology

## 2022-03-05 ENCOUNTER — Encounter: Payer: Self-pay | Admitting: Gastroenterology

## 2022-03-05 LAB — SURGICAL PATHOLOGY

## 2022-03-06 ENCOUNTER — Ambulatory Visit: Payer: Medicare Other | Admitting: Adult Health

## 2022-03-06 NOTE — Telephone Encounter (Signed)
I called and spoke with the patient today. We went over the EGD/colonoscopy results. Pathology pending and will be reported when it returns. No further questions from the patient or husband this afternoon.

## 2022-03-07 ENCOUNTER — Ambulatory Visit
Admission: EM | Admit: 2022-03-07 | Discharge: 2022-03-07 | Disposition: A | Discharge status: Home / Self Care | Payer: Medicare Other | Attending: Physician Assistant | Admitting: Physician Assistant

## 2022-03-07 ENCOUNTER — Other Ambulatory Visit: Payer: Self-pay

## 2022-03-07 ENCOUNTER — Telehealth: Payer: Self-pay | Admitting: Emergency Medicine

## 2022-03-07 DIAGNOSIS — R000 Tachycardia, unspecified: Secondary | ICD-10-CM | POA: Insufficient documentation

## 2022-03-07 DIAGNOSIS — Z20822 Contact with and (suspected) exposure to covid-19: Secondary | ICD-10-CM | POA: Diagnosis not present

## 2022-03-07 DIAGNOSIS — B349 Viral infection, unspecified: Secondary | ICD-10-CM | POA: Diagnosis not present

## 2022-03-07 DIAGNOSIS — R509 Fever, unspecified: Secondary | ICD-10-CM | POA: Insufficient documentation

## 2022-03-07 DIAGNOSIS — R0981 Nasal congestion: Secondary | ICD-10-CM | POA: Diagnosis not present

## 2022-03-07 LAB — RESP PANEL BY RT-PCR (RSV, FLU A&B, COVID)  RVPGX2
Influenza A by PCR: NEGATIVE
Influenza B by PCR: NEGATIVE
Resp Syncytial Virus by PCR: NEGATIVE
SARS Coronavirus 2 by RT PCR: NEGATIVE

## 2022-03-07 MED ORDER — BENZONATATE 100 MG PO CAPS: 100.0000 mg | ORAL_CAPSULE | Freq: Three times a day (TID) | ORAL | 0 refills | Status: DC

## 2022-03-07 MED ORDER — FLUTICASONE PROPIONATE 50 MCG/ACT NA SUSP: 1.0000 | Freq: Every day | NASAL | 0 refills | Status: AC

## 2022-03-07 MED ORDER — FLUTICASONE PROPIONATE 50 MCG/ACT NA SUSP: 1.0000 | Freq: Every day | NASAL | 0 refills | Status: DC

## 2022-03-07 MED ORDER — KETOROLAC TROMETHAMINE 30 MG/ML IJ SOLN
30.0000 mg | Freq: Once | INTRAMUSCULAR | Status: AC
  Administered 2022-03-07: 30 mg via INTRAMUSCULAR

## 2022-03-07 NOTE — ED Provider Notes (Signed)
[name]    CSN: [id number] Arrival date & time: 03/07/22  1339      History   Chief Complaint Chief Complaint  Patient presents with   URI   Headache    HPI Terri Barajas is a 76 y.o. female.   Patient presents today with a 2-day history of URI symptoms including fever, chills, cough, headache, body aches.  Denies any chest pain, shortness of breath, nausea, vomiting, diarrhea.  Reports her husband was sick with similar symptoms but he has since recovered.  Denies additional sick contacts.  She has had COVID-vaccine.  Had COVID last year.  She has been taking over-the-counter medications including Tylenol and took 1000 mg at 1:00 (2 hours ago).  Denies any recent antibiotic or steroid use; does report that she was treated for something by her stomach specialist (sounds like H. pylori) a while ago.  She denies history of asthma, COPD, diabetes.  She does have GERD and recently underwent endoscopy/colonoscopy earlier this week.  Denies any history of malignancy, immunosuppression, chronic liver/kidney disease.    Past Medical History:  Diagnosis Date   Anxiety    Arthritis    Complication of anesthesia    some type of sedation medication made her feel crazy   Dysrhythmia    Family history of adverse reaction to anesthesia    daughter has Malignant Hyperthermia hx -    GERD (gastroesophageal reflux disease)    Glaucoma    H/O: hysterectomy 1978   Heart disease    History of hiatal hernia    Hypertension    MVP (mitral valve prolapse)    Panic attacks    Pre-diabetes    Sciatica     Patient Active Problem List   Diagnosis Date Noted   Colon cancer screening 01/04/2022   Abdominal distention 01/04/2022   Interstitial pulmonary disease ([place]) 12/19/2021   Abdominal bloating 09/03/2021   Chronic intermittent hypoxia with obstructive sleep apnea 09/03/2021   Myopathies 03/28/2021   Body mass index (BMI) 26.0-26.9, adult 07/06/2020   Disc  displacement, lumbar 07/06/2020   OSA on CPAP 01/06/2019   Abnormal CT of the abdomen 10/04/2018   Radiculopathy affecting upper extremity, left 07/25/2018   Hyperlipidemia 05/13/2018   Situational anxiety 04/29/2018   MVP (mitral valve prolapse) 04/29/2018   Sleep-disordered breathing 04/29/2018   Family History of Hyperthermia, malignant 04/23/2018   Barrett's esophagus 03/01/2018   Elevated lipase 02/18/2018   Hiatal hernia 02/18/2018   Constipation 02/18/2018   Bloating 02/18/2018   Aortic atherosclerosis ([place]) 01/23/2018   Gastroesophageal reflux disease without esophagitis    Glaucoma    Hypertension     Past Surgical History:  Procedure Laterality Date   APPENDECTOMY  1972   BACK SURGERY  07/14/2020   chipped disc   BIOPSY  07/06/2018   Procedure: BIOPSY;  Surgeon: Irving Copas., MD;  Location: [facility];  Service: Gastroenterology;;   BIOPSY  03/04/2022   Procedure: BIOPSY;  Surgeon: Irving Copas., MD;  Location: [facility];  Service: Gastroenterology;;   CATARACT EXTRACTION  2013   [place] and 1978   COLONOSCOPY     COLONOSCOPY WITH PROPOFOL N/A 03/04/2022   Procedure: COLONOSCOPY WITH PROPOFOL;  Surgeon: Irving Copas., MD;  Location: [facility];  Service: Gastroenterology;  Laterality: N/A;   ESOPHAGOGASTRODUODENOSCOPY (EGD) WITH PROPOFOL N/A 07/06/2018   Procedure: ESOPHAGOGASTRODUODENOSCOPY (EGD) WITH PROPOFOL;  Surgeon: Rush Landmark Telford Nab., MD;  Location: [place];  Service: Gastroenterology;  Laterality:  N/A;   ESOPHAGOGASTRODUODENOSCOPY (EGD) WITH PROPOFOL N/A 03/04/2022   Procedure: ESOPHAGOGASTRODUODENOSCOPY (EGD) WITH PROPOFOL;  Surgeon: Rush Landmark Telford Nab., MD;  Location: [facility];  Service: Gastroenterology;  Laterality: N/A;   HAND SURGERY Bilateral    HEMORRHOID SURGERY  2018   POLYPECTOMY  03/04/2022   Procedure: POLYPECTOMY;  Surgeon: Mansouraty, Telford Nab., MD;  Location: [facility];   Service: Gastroenterology;;   S/P Hysterectomy   1987   SAVORY DILATION N/A 03/04/2022   Procedure: Azzie Almas DILATION;  Surgeon: Irving Copas., MD;  Location: [facility];  Service: Gastroenterology;  Laterality: N/A;    OB History   No obstetric history on file.      Home Medications    Prior to Admission medications   Medication Sig Start Date End Date Taking? Authorizing Provider  benzonatate (TESSALON) 100 MG capsule Take 1 capsule (100 mg total) by mouth every 8 (eight) hours. 03/07/22  Yes Lua Feng K, PA-C  fluticasone (FLONASE) 50 MCG/ACT nasal spray Place 1 spray into both nostrils daily. 03/07/22  Yes Joedy Eickhoff, Derry Skill, PA-C  acetaminophen (TYLENOL) 500 MG tablet Take 500-1,000 mg by mouth every 6 (six) hours as needed for moderate pain.    [provider]  ALPRAZolam Duanne Moron) 0.25 MG tablet Take 1 tablet (0.25 mg total) by mouth daily as needed for anxiety. 01/15/22   [name], FNP  ascorbic acid (VITAMIN C) 500 MG tablet Take 500 mg by mouth daily.    [provider]  aspirin 81 MG chewable tablet Chew 81 mg by mouth daily.    [provider]  Biotin 5 MG CAPS Take 5 mg by mouth daily.    [provider]  calcium carbonate (OSCAL) 1500 (600 Ca) MG TABS tablet Take 600 mg of elemental calcium by mouth daily.    [provider]  cholecalciferol (VITAMIN D3) 25 MCG (1000 UNIT) tablet Take 3,000 Units by mouth daily.    [provider]  diltiazem (CARDIZEM) 30 MG tablet TAKE 1 TABLET BY MOUTH  TWICE DAILY 07/23/21   Skeet Latch, MD  dorzolamide-timolol (COSOPT) 22.3-6.8 MG/ML ophthalmic solution Apply 1 drop to eye in the morning and at bedtime.    [provider]  Evolocumab (REPATHA SURECLICK) 150 MG/ML SOAJ Inject 140 mg into the skin every 14 (fourteen) days. 02/21/22   Skeet Latch, MD  hydrochlorothiazide (HYDRODIURIL) 25 MG tablet Take 1 tablet (25 mg total) by mouth daily. 12/19/21   [name],  FNP  ibuprofen (ADVIL) 200 MG tablet Take 200-400 mg by mouth every 6 (six) hours as needed for moderate pain.    [provider]  latanoprost (XALATAN) 0.005 % ophthalmic solution Place 1 drop into both eyes at bedtime.     [provider]  loratadine (CLARITIN REDITABS) 10 MG dissolvable tablet Take 10 mg by mouth daily as needed for allergies.    [provider]  Multiple Vitamins-Minerals (CENTRUM SILVER 50+WOMEN PO) Take 1 tablet by mouth daily.    [provider]  Omega-3 Fatty Acids (FISH OIL PO) Take 1 capsule by mouth daily.    [provider]  omeprazole (PRILOSEC) 20 MG capsule Take 20 mg by mouth 2 (two) times daily before a meal.    [provider]  polyethylene glycol (MIRALAX / GLYCOLAX) packet Take 17 g by mouth daily as needed for moderate constipation.    [provider]  Probiotic Product (ALIGN PO) Take 1 capsule by mouth daily.    [provider]  RHOPRESSA 0.02 % SOLN Place 1 drop into both eyes every evening. 05/25/20   [provider]  vitamin E 180 MG (400 UNITS) capsule Take 400 Units by mouth daily.    [provider]    Family History Family History  Problem Relation Age of Onset   Pancreatic cancer Mother    Alcohol abuse Father    Asthma Sister    Hypertension Sister    Arthritis Sister    Diabetes Brother    Arthritis Brother    Hyperlipidemia Brother    Hypertension Brother    Glaucoma Brother    Colon polyps Brother    Arthritis Sister    Arthritis Sister    Prostate cancer Brother    Congenital heart disease Brother    Colon cancer Neg Hx    Esophageal cancer Neg Hx    Inflammatory bowel disease Neg Hx    Liver disease Neg Hx    Rectal cancer Neg Hx    Stomach cancer Neg Hx     Social History Social History   Tobacco Use   Smoking status: Never   Smokeless tobacco: Never  Vaping Use   Vaping Use: Never used  Substance Use Topics   Alcohol use:  Never   Drug use: Never     Allergies   Shellfish allergy, Atorvastatin, Atracurium & derivatives, Atropine, Iodine, and Statins   Review of Systems Review of Systems  Constitutional:  Positive for activity change, chills, fatigue and fever. Negative for appetite change.  HENT:  Positive for congestion and sore throat. Negative for sinus pressure and sneezing.   Respiratory:  Positive for cough. Negative for shortness of breath.   Cardiovascular:  Negative for chest pain.  Gastrointestinal:  Negative for abdominal pain, diarrhea, nausea and vomiting.  Neurological:  Positive for headaches. Negative for dizziness and light-headedness.     Physical Exam Triage Vital Signs ED Triage Vitals  Enc Vitals Group     BP 03/07/22 1506 138/83     Pulse Rate 03/07/22 1506 (!) 133     Resp 03/07/22 1506 17     Temp 03/07/22 1506 (!) 102.1 F (38.9 C)     Temp Source 03/07/22 1506 Oral     SpO2 03/07/22 1506 93 %     Weight --      Height --      Head Circumference --      Peak Flow --      Pain Score 03/07/22 1511 7     Pain Loc --      Pain Edu? --      Excl. in [place]? --    No data found.  Updated Vital Signs BP 138/83 (BP Location: Left Arm)   Pulse (!) 125   Temp (!) 100.5 F (38.1 C) (Oral)   Resp 17   LMP  (LMP Unknown)   SpO2 93%   Visual Acuity Right Eye Distance:   Left Eye Distance:   Bilateral Distance:    Right Eye Near:   Left Eye Near:    Bilateral Near:     Physical Exam Vitals reviewed.  Constitutional:      General: She is awake. She is not in acute distress.    Appearance: Normal appearance. She is well-developed. She is not ill-appearing.     Comments: Very pleasant female appears stated age in no acute distress sitting comfortably in exam room  HENT:     Head: Normocephalic and atraumatic.  Right Ear: Tympanic membrane, ear canal and external ear normal. Tympanic membrane is not erythematous or bulging.     Left Ear: Tympanic membrane, ear  canal and external ear normal. Tympanic membrane is not erythematous or bulging.     Nose:     Right Sinus: No maxillary sinus tenderness or frontal sinus tenderness.     Left Sinus: No maxillary sinus tenderness or frontal sinus tenderness.     Mouth/Throat:     Pharynx: Uvula midline. No oropharyngeal exudate or posterior oropharyngeal erythema.  Cardiovascular:     Rate and Rhythm: Normal rate and regular rhythm.     Heart sounds: Normal heart sounds, S1 normal and S2 normal. No murmur heard. Pulmonary:     Effort: Pulmonary effort is normal.     Breath sounds: Normal breath sounds. No wheezing, rhonchi or rales.     Comments: Clear to auscultation bilaterally Psychiatric:        Behavior: Behavior is cooperative.      UC Treatments / Results  Labs (all labs ordered are listed, but only abnormal results are displayed) Labs Reviewed  RESP PANEL BY RT-PCR (RSV, FLU A&B, COVID)  [id number]    EKG   Radiology No results found.  Procedures Procedures (including critical care time)  Medications Ordered in UC Medications  ketorolac (TORADOL) 30 MG/ML injection 30 mg (30 mg Intramuscular Given 03/07/22 1539)    Initial Impression / Assessment and Plan / UC Course  I have reviewed the triage vital signs and the nursing notes.  Pertinent labs & imaging results that were available during my care of the patient were reviewed by me and considered in my medical decision making (see chart for details).     Strongly suspect viral etiology based on clinical presentation and known sick contacts.  Patient had recently taken Tylenol but remained febrile with temperature of 102.1 F on intake.  She was given Toradol injection given history of GERD to bypass GI tract.  Her temperature improved but she still continued to be tachycardic.  EKG was obtained which showed sinus tachycardia with ventricular rate of 116 bpm without ischemic changes; compared to 03/08/2021 tracing sinus tachycardia  replaces normal sinus rhythm without additional significant changes.  No indication for dose adjustment of Toradol based on CMP from 12/19/2021 with creatinine of 0.80 and calculated creatinine clearance of 64.94 mL/min.  Patient did feel much better after this medication.  Patient was tested for COVID and flu.  She was encouraged to monitor her MyChart for these results we will call her if anything is positive.  Based on her age she is a candidate for antiviral therapy for both flu and COVID.  She does have a recent EGFR of 77 from 12/19/2021.  Regarding her medication she would need to decrease dose of diltiazem to half (half tablet) while on the medication and for 3 days after completing course of Paxlovid.  She would also need to hold alprazolam while on the medication and for 3 days after completing course.  She was encouraged to alternate Tylenol ibuprofen for pain and fever.  She was given a prescription for Tessalon for cough.  Can use over-the-counter medications including Flonase and Mucinex for symptom relief.  Discussed that if her symptoms are not improving she needs to return for reevaluation.  If anything worsens and she has persistent high fever not respond to medication, chest pain, shortness of breath, weakness, nausea/vomiting interfering with oral intake, worsening cough she needs to be seen immediately  to which she expressed understanding.  Strict return precautions given.  Work excuse note provided.  Final Clinical Impressions(s) / UC Diagnoses   Final diagnoses:  Viral illness  Fever, unspecified  Nasal congestion  Sinus tachycardia     Discharge Instructions      Your EKG was stable which is great news.  I believe that your heart rate is elevated because of your ongoing fever.  Please continue to alternate Tylenol and ibuprofen for fever and pain.  Make sure you rest and drink plenty of fluid.  You should push fluids while you are sick.  I have called in Tessalon for cough and I  would like you to use Flonase for congestion.  We will contact you if you are positive for flu or COVID.  If you are positive for flu we will call in Tamiflu.  If you are positive for COVID we will consider Paxlovid.  If you take Paxlovid you will need to hold your alprazolam (Xanax) while on the medication for 3 days after completing course.  You also need to cut your diltiazem in half while on the medication and for 3 days after completing course.  Make sure that you follow-up with your primary care provider soon as possible.  If at any point you have worsening symptoms you need to go to the emergency room immediately including high fever not responding to medication, chest pain, shortness of breath, headache, vision change, dizziness, weakness, worsening cough.     ED Prescriptions     Medication Sig Dispense Auth. Provider   benzonatate (TESSALON) 100 MG capsule Take 1 capsule (100 mg total) by mouth every 8 (eight) hours. 21 capsule Kelise Kuch K, PA-C   fluticasone (FLONASE) 50 MCG/ACT nasal spray Place 1 spray into both nostrils daily. 16 g Lakishia Bourassa K, PA-C      I have reviewed the PDMP during this encounter.   Terrilee Croak, PA-C 03/07/22 1708

## 2022-03-07 NOTE — ED Triage Notes (Signed)
Pt presents with fever, chills, cough, headache, and generalized body aches X 2 days.

## 2022-03-07 NOTE — Discharge Instructions (Signed)
Your EKG was stable which is great news.  I believe that your heart rate is elevated because of your ongoing fever.  Please continue to alternate Tylenol and ibuprofen for fever and pain.  Make sure you rest and drink plenty of fluid.  You should push fluids while you are sick.  I have called in Tessalon for cough and I would like you to use Flonase for congestion.  We will contact you if you are positive for flu or COVID.  If you are positive for flu we will call in Tamiflu.  If you are positive for COVID we will consider Paxlovid.  If you take Paxlovid you will need to hold your alprazolam (Xanax) while on the medication for 3 days after completing course.  You also need to cut your diltiazem in half while on the medication and for 3 days after completing course.  Make sure that you follow-up with your primary care provider soon as possible.  If at any point you have worsening symptoms you need to go to the emergency room immediately including high fever not responding to medication, chest pain, shortness of breath, headache, vision change, dizziness, weakness, worsening cough.

## 2022-03-08 ENCOUNTER — Encounter: Payer: Self-pay | Admitting: Gastroenterology

## 2022-03-10 ENCOUNTER — Encounter: Payer: Self-pay | Admitting: Gastroenterology

## 2022-03-11 ENCOUNTER — Telehealth: Payer: Self-pay

## 2022-03-11 ENCOUNTER — Encounter: Payer: Self-pay | Admitting: Nurse Practitioner

## 2022-03-11 NOTE — Telephone Encounter (Signed)
Transition Care Management Follow-up Telephone Call Date of discharge and from where: 03/07/2022 [facility] How have you been since you were released from the hospital? Pt states she still feels bad.  Any questions or concerns? No  Items Reviewed: Did the pt receive and understand the discharge instructions provided? Yes  Medications obtained and verified? Yes  Other? Yes  Any new allergies since your discharge? No  Dietary orders reviewed? Yes Do you have support at home? Yes   Home Care and Equipment/Supplies: Were home health services ordered? no If so, what is the name of the agency? N/a  Has the agency set up a time to come to the patient's home? no Were any new equipment or medical supplies ordered?  No What is the name of the medical supply agency? n/a Were you able to get the supplies/equipment? no Do you have any questions related to the use of the equipment or supplies? No  Functional Questionnaire: (I = Independent and D = Dependent) ADLs: i  Bathing/Dressing- i  Meal Prep- i  Eating- i  Maintaining continence- i  Transferring/Ambulation- i  Managing Meds- i  Follow up appointments reviewed:  PCP Hospital f/u appt confirmed? Yes  Scheduled to see [name] on Tuesday 03/12/2022  @ [facility]. [facility] f/u appt confirmed? No  Scheduled to see n/a on n/a @ n/a. Are transportation arrangements needed? No  If their condition worsens, is the pt aware to call PCP or go to the Emergency Dept.? Yes Was the patient provided with contact information for the PCP's office or ED? Yes Was to pt encouraged to call back with questions or concerns? Yes

## 2022-03-11 NOTE — Telephone Encounter (Signed)
We discussed the consideration of a CT scan.  Ideally we would do it with contrast that she has an allergy.  If her contrast allergy was not anaphylaxis then we could try to prep her with Benadryl to get a better CT scan image.  If she is willing to move forward with this and her allergy again was not anaphylaxis with iodine contrast then lets move forward with that.  If she wants to begin with just an abdominal ultrasound before going to true cross-sectional imaging that is okay as well. Thanks. GM

## 2022-03-12 ENCOUNTER — Ambulatory Visit: Payer: Medicare Other | Admitting: Nurse Practitioner

## 2022-03-12 ENCOUNTER — Telehealth: Payer: Self-pay

## 2022-03-12 DIAGNOSIS — R140 Abdominal distension (gaseous): Secondary | ICD-10-CM

## 2022-03-12 NOTE — Telephone Encounter (Signed)
Left message on machine to call back  

## 2022-03-12 NOTE — Telephone Encounter (Signed)
We discussed the consideration of a CT scan.  Ideally we would do it with contrast that she has an allergy.  If her contrast allergy was not anaphylaxis then we could try to prep her with Benadryl to get a better CT scan image.  If she is willing to move forward with this and her allergy again was not anaphylaxis with iodine contrast then lets move forward with that.  If she wants to begin with just an abdominal ultrasound before going to true cross-sectional imaging that is okay as well. Thanks. GM

## 2022-03-13 ENCOUNTER — Encounter: Payer: Self-pay | Admitting: Nurse Practitioner

## 2022-03-13 ENCOUNTER — Ambulatory Visit (INDEPENDENT_AMBULATORY_CARE_PROVIDER_SITE_OTHER): Payer: Medicare Other | Admitting: Nurse Practitioner

## 2022-03-13 VITALS — BP 122/62 | HR 62 | Temp 97.3°F | Ht 63.0 in | Wt 146.4 lb

## 2022-03-13 DIAGNOSIS — R051 Acute cough: Secondary | ICD-10-CM

## 2022-03-13 DIAGNOSIS — Z8709 Personal history of other diseases of the respiratory system: Secondary | ICD-10-CM

## 2022-03-13 MED ORDER — HYDROCODONE BIT-HOMATROP MBR 5-1.5 MG/5ML PO SOLN: 5.0000 mL | Freq: Four times a day (QID) | ORAL | 0 refills | Status: DC | PRN

## 2022-03-13 MED ORDER — PREDNISONE 10 MG (21) PO TBPK: ORAL_TABLET | ORAL | 0 refills | Status: DC

## 2022-03-13 NOTE — Progress Notes (Signed)
I,[name],acting as a Education administrator for [employer], FNP.,have documented all relevant documentation on the behalf of [name], FNP,as directed by  [name], FNP while in the presence of [name], [place].  Subjective:     Patient ID: Terri Barajas , female    DOB: 1945-11-18 , 76 y.o.   MRN: [id number]   Chief Complaint  Patient presents with   Follow-up    HPI  Patient presents today for ED follow up. She went to ER for URI symptoms had covid test which was negative. She is now still coughing mostly at night, with white secretions. Tessalon perles are not effective. She is still having chills and watery eyes. Denies shortness of breath. She did have a decreased appetite.      Past Medical History:  Diagnosis Date   Anxiety    Arthritis    Complication of anesthesia    some type of sedation medication made her feel crazy   Dysrhythmia    Family history of adverse reaction to anesthesia    daughter has Malignant Hyperthermia hx -    GERD (gastroesophageal reflux disease)    Glaucoma    H/O: hysterectomy 1978   Heart disease    History of hiatal hernia    Hypertension    MVP (mitral valve prolapse)    Panic attacks    Pre-diabetes    Sciatica      Family History  Problem Relation Age of Onset   Pancreatic cancer Mother    Alcohol abuse Father    Asthma Sister    Hypertension Sister    Arthritis Sister    Diabetes Brother    Arthritis Brother    Hyperlipidemia Brother    Hypertension Brother    Glaucoma Brother    Colon polyps Brother    Arthritis Sister    Arthritis Sister    Prostate cancer Brother    Congenital heart disease Brother    Colon cancer Neg Hx    Esophageal cancer Neg Hx    Inflammatory bowel disease Neg Hx    Liver disease Neg Hx    Rectal cancer Neg Hx    Stomach cancer Neg Hx      Current Outpatient Medications:    HYDROcodone bit-homatropine (HYDROMET) 5-1.5 MG/5ML syrup, Take 5 mLs by mouth every 6 (six) hours as needed  for cough., Disp: 120 mL, Rfl: 0   predniSONE (STERAPRED UNI-PAK 21 TAB) 10 MG (21) TBPK tablet, Take as directed, Disp: 21 tablet, Rfl: 0   acetaminophen (TYLENOL) 500 MG tablet, Take 500-1,000 mg by mouth every 6 (six) hours as needed for moderate pain., Disp: , Rfl:    ALPRAZolam (XANAX) 0.25 MG tablet, Take 1 tablet (0.25 mg total) by mouth daily as needed for anxiety., Disp: 30 tablet, Rfl: 0   ascorbic acid (VITAMIN C) 500 MG tablet, Take 500 mg by mouth daily., Disp: , Rfl:    aspirin 81 MG chewable tablet, Chew 81 mg by mouth daily., Disp: , Rfl:    benzonatate (TESSALON) 100 MG capsule, Take 1 capsule (100 mg total) by mouth every 8 (eight) hours., Disp: 21 capsule, Rfl: 0   Biotin 5 MG CAPS, Take 5 mg by mouth daily., Disp: , Rfl:    calcium carbonate (OSCAL) 1500 (600 Ca) MG TABS tablet, Take 600 mg of elemental calcium by mouth daily., Disp: , Rfl:    cholecalciferol (VITAMIN D3) 25 MCG (1000 UNIT) tablet, Take 3,000 Units by mouth daily., Disp: , Rfl:  diltiazem (CARDIZEM) 30 MG tablet, TAKE 1 TABLET BY MOUTH  TWICE DAILY, Disp: 180 tablet, Rfl: 3   dorzolamide-timolol (COSOPT) 22.3-6.8 MG/ML ophthalmic solution, Apply 1 drop to eye in the morning and at bedtime., Disp: , Rfl:    Evolocumab (REPATHA SURECLICK) 338 MG/ML SOAJ, Inject 140 mg into the skin every 14 (fourteen) days., Disp: 2 mL, Rfl: 11   fluticasone (FLONASE) 50 MCG/ACT nasal spray, Place 1 spray into both nostrils daily., Disp: 16 g, Rfl: 0   hydrochlorothiazide (HYDRODIURIL) 25 MG tablet, Take 1 tablet (25 mg total) by mouth daily., Disp: 90 tablet, Rfl: 2   ibuprofen (ADVIL) 200 MG tablet, Take 200-400 mg by mouth every 6 (six) hours as needed for moderate pain., Disp: , Rfl:    latanoprost (XALATAN) 0.005 % ophthalmic solution, Place 1 drop into both eyes at bedtime. , Disp: , Rfl:    loratadine (CLARITIN REDITABS) 10 MG dissolvable tablet, Take 10 mg by mouth daily as needed for allergies., Disp: , Rfl:    Multiple  Vitamins-Minerals (CENTRUM SILVER 50+WOMEN PO), Take 1 tablet by mouth daily. (Patient not taking: Reported on 03/11/2022), Disp: , Rfl:    Omega-3 Fatty Acids (FISH OIL PO), Take 1 capsule by mouth daily. (Patient not taking: Reported on 03/11/2022), Disp: , Rfl:    omeprazole (PRILOSEC) 20 MG capsule, Take 20 mg by mouth 2 (two) times daily before a meal., Disp: , Rfl:    polyethylene glycol (MIRALAX / GLYCOLAX) packet, Take 17 g by mouth daily as needed for moderate constipation., Disp: , Rfl:    Probiotic Product (ALIGN PO), Take 1 capsule by mouth daily., Disp: , Rfl:    RHOPRESSA 0.02 % SOLN, Place 1 drop into both eyes every evening., Disp: , Rfl:    vitamin E 180 MG (400 UNITS) capsule, Take 400 Units by mouth daily., Disp: , Rfl:    Allergies  Allergen Reactions   Shellfish Allergy Anaphylaxis and Shortness Of Breath   Atorvastatin     Myalgias    Atracurium & Derivatives Other (See Comments)    Unknown   Atropine Other (See Comments)    Heart racing   Iodine Other (See Comments)    Unknown   Statins     "didn't feel right"      Review of Systems  Constitutional: Negative.   Respiratory:  Positive for cough.   Cardiovascular: Negative.   Gastrointestinal: Negative.   Neurological: Negative.   Psychiatric/Behavioral: Negative.       Today's Vitals   03/13/22 1110  BP: 122/62  Pulse: 62  Temp: (!) 97.3 F (36.3 C)  TempSrc: Oral  SpO2: 95%  Weight: 146 lb 6.4 oz (66.4 kg)  Height: '5\' 3"'$  (1.6 m)   Body mass index is 25.93 kg/m.   Objective:  Physical Exam Vitals reviewed.  Constitutional:      General: She is not in acute distress.    Appearance: Normal appearance.  HENT:     Head: Normocephalic.  Cardiovascular:     Rate and Rhythm: Normal rate and regular rhythm.     Pulses: Normal pulses.     Heart sounds: Normal heart sounds. No murmur heard. Pulmonary:     Effort: Pulmonary effort is normal. No respiratory distress.     Breath sounds: Normal  breath sounds. No wheezing.     Comments: Hacking cough noted Abdominal:     General: Abdomen is flat. Bowel sounds are normal. There is no distension.     Palpations:  Abdomen is soft.     Tenderness: There is no abdominal tenderness.  Musculoskeletal:     Cervical back: No tenderness.  Skin:    Capillary Refill: Capillary refill takes less than 2 seconds.  Neurological:     General: No focal deficit present.     Mental Status: She is alert and oriented to person, place, and time.     Cranial Nerves: No cranial nerve deficit.     Motor: No weakness.  Psychiatric:        Mood and Affect: Mood normal.        Behavior: Behavior normal.        Thought Content: Thought content normal.        Judgment: Judgment normal.         Assessment And Plan:     1. Acute cough Comments: Lingering hacking cough, will treat with steroid and send Rx for cough syrup to help her to get some sleep - predniSONE (STERAPRED UNI-PAK 21 TAB) 10 MG (21) TBPK tablet; Take as directed  Dispense: 21 tablet; Refill: 0 - HYDROcodone bit-homatropine (HYDROMET) 5-1.5 MG/5ML syrup; Take 5 mLs by mouth every 6 (six) hours as needed for cough.  Dispense: 120 mL; Refill: 0  2. History of interstitial lung disease Comments: Occurred after her last Covid      Patient was given opportunity to ask questions. Patient verbalized understanding of the plan and was able to repeat key elements of the plan. All questions were answered to their satisfaction.  [name], FNP   I, [name], FNP, have reviewed all documentation for this visit. The documentation on 03/13/22 for the exam, diagnosis, procedures, and orders are all accurate and complete.   IF YOU HAVE BEEN REFERRED TO A SPECIALIST, IT MAY TAKE 1-2 WEEKS TO SCHEDULE/PROCESS THE REFERRAL. IF YOU HAVE NOT HEARD FROM US/SPECIALIST IN TWO WEEKS, PLEASE GIVE [place] A CALL AT [phone].   THE PATIENT IS ENCOURAGED TO PRACTICE SOCIAL DISTANCING DUE TO THE  COVID-19 PANDEMIC.

## 2022-03-13 NOTE — Telephone Encounter (Signed)
Left message on machine to call back  

## 2022-03-14 NOTE — Telephone Encounter (Signed)
The pt has decided to start with [place] first.  The order has been entered and sent to the schedulers to set up

## 2022-03-16 ENCOUNTER — Other Ambulatory Visit: Payer: Self-pay | Admitting: Cardiovascular Disease

## 2022-03-18 DIAGNOSIS — M25662 Stiffness of left knee, not elsewhere classified: Secondary | ICD-10-CM | POA: Diagnosis not present

## 2022-03-18 DIAGNOSIS — M25562 Pain in left knee: Secondary | ICD-10-CM | POA: Diagnosis not present

## 2022-03-19 ENCOUNTER — Ambulatory Visit: Payer: Medicare Other

## 2022-03-21 ENCOUNTER — Ambulatory Visit
Admission: RE | Admit: 2022-03-21 | Discharge: 2022-03-21 | Disposition: A | Discharge status: Home / Self Care | Payer: Medicare Other | Source: Ambulatory Visit | Attending: Nurse Practitioner | Admitting: Nurse Practitioner

## 2022-03-21 ENCOUNTER — Encounter: Payer: Self-pay | Admitting: Gastroenterology

## 2022-03-21 DIAGNOSIS — Z1231 Encounter for screening mammogram for malignant neoplasm of breast: Secondary | ICD-10-CM

## 2022-03-23 ENCOUNTER — Ambulatory Visit (HOSPITAL_COMMUNITY)
Admission: RE | Admit: 2022-03-23 | Discharge: 2022-03-23 | Disposition: A | Discharge status: Home / Self Care | Payer: Medicare Other | Source: Ambulatory Visit | Attending: Gastroenterology | Admitting: Gastroenterology

## 2022-03-23 DIAGNOSIS — R140 Abdominal distension (gaseous): Secondary | ICD-10-CM | POA: Insufficient documentation

## 2022-03-28 ENCOUNTER — Ambulatory Visit: Payer: Medicare Other | Admitting: Nurse Practitioner

## 2022-04-03 DIAGNOSIS — Z4789 Encounter for other orthopedic aftercare: Secondary | ICD-10-CM | POA: Diagnosis not present

## 2022-04-29 DIAGNOSIS — G4733 Obstructive sleep apnea (adult) (pediatric): Secondary | ICD-10-CM | POA: Diagnosis not present

## 2022-05-02 ENCOUNTER — Ambulatory Visit (INDEPENDENT_AMBULATORY_CARE_PROVIDER_SITE_OTHER): Payer: Medicare Other

## 2022-05-02 VITALS — BP 116/69 | HR 74 | Ht 63.0 in | Wt 148.0 lb

## 2022-05-02 DIAGNOSIS — Z0000 Encounter for general adult medical examination without abnormal findings: Secondary | ICD-10-CM

## 2022-05-02 NOTE — Progress Notes (Signed)
I connected with Terri Barajas today by telephone and verified that I am speaking with the correct person using two identifiers. Location patient: home Location provider: work Persons participating in the virtual visit: Terri Barajas, [place] LPN.   I discussed the limitations, risks, security and privacy concerns of performing an evaluation and management service by telephone and the availability of in person appointments. I also discussed with the patient that there may be a patient responsible charge related to this service. The patient expressed understanding and verbally consented to this telephonic visit.    Interactive audio and video telecommunications were attempted between this provider and patient, however failed, due to patient having technical difficulties OR patient did not have access to video capability.  We continued and completed visit with audio only.     Vital signs may be patient reported or missing.  Subjective:   Terri Barajas is a 76 y.o. female who presents for Medicare Annual (Subsequent) preventive examination.  Review of Systems     Cardiac Risk Factors include: advanced age (>63mn, >>1women);dyslipidemia;hypertension     Objective:    Today's Vitals   05/02/22 0813 05/02/22 0815  BP: 116/69   Pulse: 74   Weight: 148 lb (67.1 kg)   Height: '5\' 3"'$  (1.6 m)   PainSc:  2    Body mass index is 26.22 kg/m.     05/02/2022    8:23 AM 03/04/2022    6:46 AM 04/26/2021    8:58 AM 05/18/2020    9:41 AM 09/28/2019    9:07 PM 07/16/2018   10:46 PM 07/06/2018    6:27 AM  Advanced Directives  Does Patient Have a Medical Advance Directive? Yes No Yes Yes No No No  Type of [place]of [place] will  [place]an AntonioLiving will H[place]Living will     Copy of [place] Chart? No - copy requested  No - copy requested      Would patient like information on  creating a medical advance directive?  No - Patient declined    No - Patient declined No - Patient declined    Current Medications (verified) Outpatient Encounter Medications as of 05/02/2022  Medication Sig   acetaminophen (TYLENOL) 500 MG tablet Take 500-1,000 mg by mouth every 6 (six) hours as needed for moderate pain.   ALPRAZolam (XANAX) 0.25 MG tablet Take 1 tablet (0.25 mg total) by mouth daily as needed for anxiety.   ascorbic acid (VITAMIN C) 500 MG tablet Take 500 mg by mouth daily.   aspirin 81 MG chewable tablet Chew 81 mg by mouth daily.   Biotin 5 MG CAPS Take 5 mg by mouth daily.   calcium carbonate (OSCAL) 1500 (600 Ca) MG TABS tablet Take 600 mg of elemental calcium by mouth daily.   cholecalciferol (VITAMIN D3) 25 MCG (1000 UNIT) tablet Take 3,000 Units by mouth daily.   diltiazem (CARDIZEM) 30 MG tablet TAKE 1 TABLET BY MOUTH  TWICE DAILY   dorzolamide-timolol (COSOPT) 22.3-6.8 MG/ML ophthalmic solution Apply 1 drop to eye in the morning and at bedtime.   Evolocumab (REPATHA SURECLICK) 1751MG/ML SOAJ Inject 140 mg into the skin every 14 (fourteen) days.   fluticasone (FLONASE) 50 MCG/ACT nasal spray Place 1 spray into both nostrils daily.   hydrochlorothiazide (HYDRODIURIL) 25 MG tablet Take 1 tablet (25 mg total) by mouth daily.   ibuprofen (ADVIL) 200 MG tablet Take 200-400 mg by mouth every  6 (six) hours as needed for moderate pain.   latanoprost (XALATAN) 0.005 % ophthalmic solution Place 1 drop into both eyes at bedtime.    loratadine (CLARITIN REDITABS) 10 MG dissolvable tablet Take 10 mg by mouth daily as needed for allergies.   Multiple Vitamins-Minerals (CENTRUM SILVER 50+WOMEN PO) Take 1 tablet by mouth daily.   omeprazole (PRILOSEC) 20 MG capsule Take 20 mg by mouth 2 (two) times daily before a meal.   polyethylene glycol (MIRALAX / GLYCOLAX) packet Take 17 g by mouth daily as needed for moderate constipation.   Probiotic Product (ALIGN PO) Take 1 capsule by  mouth daily.   RHOPRESSA 0.02 % SOLN Place 1 drop into both eyes every evening.   vitamin E 180 MG (400 UNITS) capsule Take 400 Units by mouth daily.   benzonatate (TESSALON) 100 MG capsule Take 1 capsule (100 mg total) by mouth every 8 (eight) hours. (Patient not taking: Reported on 05/02/2022)   HYDROcodone bit-homatropine (HYDROMET) 5-1.5 MG/5ML syrup Take 5 mLs by mouth every 6 (six) hours as needed for cough. (Patient not taking: Reported on 05/02/2022)   Omega-3 Fatty Acids (FISH OIL PO) Take 1 capsule by mouth daily. (Patient not taking: Reported on 05/02/2022)   predniSONE (STERAPRED UNI-PAK 21 TAB) 10 MG (21) TBPK tablet Take as directed (Patient not taking: Reported on 05/02/2022)   No facility-administered encounter medications on file as of 05/02/2022.    Allergies (verified) Shellfish allergy, Atorvastatin, Atracurium & derivatives, Atropine, Iodine, and Statins   History: Past Medical History:  Diagnosis Date   Anxiety    Arthritis    Complication of anesthesia    some type of sedation medication made her feel crazy   Dysrhythmia    Family history of adverse reaction to anesthesia    daughter has Malignant Hyperthermia hx -    GERD (gastroesophageal reflux disease)    Glaucoma    H/O: hysterectomy 1978   Heart disease    History of hiatal hernia    Hypertension    MVP (mitral valve prolapse)    Panic attacks    Pre-diabetes    Sciatica    Past Surgical History:  Procedure Laterality Date   APPENDECTOMY  1972   BACK SURGERY  07/14/2020   chipped disc   BIOPSY  07/06/2018   Procedure: BIOPSY;  Surgeon: Irving Copas., MD;  Location: [facility];  Service: Gastroenterology;;   BIOPSY  03/04/2022   Procedure: BIOPSY;  Surgeon: Irving Copas., MD;  Location: [facility];  Service: Gastroenterology;;   CATARACT EXTRACTION  2013   [place] PROPOFOL N/A 03/04/2022   Procedure:  COLONOSCOPY WITH PROPOFOL;  Surgeon: Irving Copas., MD;  Location: [facility];  Service: Gastroenterology;  Laterality: N/A;   ESOPHAGOGASTRODUODENOSCOPY (EGD) WITH PROPOFOL N/A 07/06/2018   Procedure: ESOPHAGOGASTRODUODENOSCOPY (EGD) WITH PROPOFOL;  Surgeon: Rush Landmark Telford Nab., MD;  Location: [facility];  Service: Gastroenterology;  Laterality: N/A;   ESOPHAGOGASTRODUODENOSCOPY (EGD) WITH PROPOFOL N/A 03/04/2022   Procedure: ESOPHAGOGASTRODUODENOSCOPY (EGD) WITH PROPOFOL;  Surgeon: Rush Landmark Telford Nab., MD;  Location: [facility];  Service: Gastroenterology;  Laterality: N/A;   HAND SURGERY Bilateral    HEMORRHOID SURGERY  2018   KNEE CARTILAGE SURGERY Left 01/28/2022   POLYPECTOMY  03/04/2022   Procedure: POLYPECTOMY;  Surgeon: Mansouraty, Telford Nab., MD;  Location: [facility];  Service: Gastroenterology;;   S/P Hysterectomy   1987   SAVORY DILATION N/A 03/04/2022  Procedure: SAVORY DILATION;  Surgeon: Irving Copas., MD;  Location: [facility];  Service: Gastroenterology;  Laterality: N/A;   Family History  Problem Relation Age of Onset   Pancreatic cancer Mother    Alcohol abuse Father    Asthma Sister    Hypertension Sister    Arthritis Sister    Diabetes Brother    Arthritis Brother    Hyperlipidemia Brother    Hypertension Brother    Glaucoma Brother    Colon polyps Brother    Arthritis Sister    Arthritis Sister    Prostate cancer Brother    Congenital heart disease Brother    Colon cancer Neg Hx    Esophageal cancer Neg Hx    Inflammatory bowel disease Neg Hx    Liver disease Neg Hx    Rectal cancer Neg Hx    Stomach cancer Neg Hx    Social History   Socioeconomic History   Marital status: Married    Spouse name: Not on file   Number of children: Not on file   Years of education: 12   Highest education level: Not on file  Occupational History   Occupation: retired  Tobacco Use   Smoking status: Never   Smokeless tobacco:  Never  Vaping Use   Vaping Use: Never used  Substance and Sexual Activity   Alcohol use: Never   Drug use: Never   Sexual activity: Yes    Partners: Male  Other Topics Concern   Not on file  Social History Narrative   Not on file   Social Determinants of Health   Financial Resource Strain: Low Risk  (05/02/2022)   Overall Financial Resource Strain (CARDIA)    Difficulty of Paying Living Expenses: Not hard at all  Food Insecurity: No Food Insecurity (05/02/2022)   Hunger Vital Sign    Worried About Running Out of Food in the Last Year: Never true    Ran Out of Food in the Last Year: Never true  Transportation Needs: No Transportation Needs (05/02/2022)   PRAPARE - Hydrologist (Medical): No    Lack of Transportation (Non-Medical): No  Physical Activity: Insufficiently Active (05/02/2022)   Exercise Vital Sign    Days of Exercise per Week: 3 days    Minutes of Exercise per Session: 30 min  Stress: No Stress Concern Present (05/02/2022)   [place]    Feeling of Stress : Not at all  Social Connections: Not on file    Tobacco Counseling Counseling given: Not Answered   Clinical Intake:  Pre-visit preparation completed: Yes  Pain : 0-10 Pain Score: 2  Pain Type: Chronic pain Pain Location: Knee Pain Orientation: Left Pain Descriptors / Indicators: Aching Pain Onset: More than a month ago Pain Frequency: Constant     Nutritional Status: BMI 25 -29 Overweight Nutritional Risks: None Diabetes: No  How often do you need to have someone help you when you read instructions, pamphlets, or other written materials from your doctor or pharmacy?: 1 - Never  Diabetic? no  Interpreter Needed?: No  Information entered by :: NAllen LPN   Activities of Daily Living    05/02/2022    8:24 AM  In your present state of health, do you have any difficulty performing the  following activities:  Hearing? 0  Vision? 1  Comment has glaucoma  Difficulty concentrating or making decisions? 0  Walking or climbing stairs? 1  Dressing or bathing? 0  Doing errands, shopping? 0  Preparing Food and eating ? N  Using the Toilet? N  In the past six months, have you accidently leaked urine? Y  Comment with sneeze or cough  Do you have problems with loss of bowel control? N  Managing your Medications? N  Managing your Finances? N  Housekeeping or managing your Housekeeping? N    Patient Care Team: [name], FNP as PCP - General ([place]) Skeet Latch, MD as PCP - Cardiology (Cardiology) Rossie Muskrat, MD as Referring Physician (Podiatry) Mayford Knife, [facility] (Pharmacist)  Indicate any recent Medical Services you may have received from other than [facility] providers in the past year (date may be approximate).     Assessment:   This is a routine wellness examination for [place].  Hearing/Vision screen Vision Screening - Comments:: Regular eye exams, Dr. Genevie Ann  Dietary issues and exercise activities discussed: Current Exercise Habits: Home exercise routine, Type of exercise: Other - see comments (stationary bike), Time (Minutes): 30, Frequency (Times/Week): 3, Weekly Exercise (Minutes/Week): 90   Goals Addressed             This Visit's Progress    Patient Stated       05/02/2022, wants to get down to 140 pounds       Depression Screen    05/02/2022    8:24 AM 01/15/2022    3:42 PM 12/19/2021   10:30 AM 07/26/2021   10:03 AM 04/26/2021    8:59 AM 05/18/2020    9:42 AM 11/04/2019    9:38 AM  PHQ 2/9 Scores  PHQ - 2 Score 0 2 0 0 0 2 0  PHQ- 9 Score  5  0       Fall Risk    05/02/2022    8:24 AM 01/15/2022    3:44 PM 12/19/2021   10:30 AM 04/26/2021    8:59 AM 05/18/2020    9:41 AM  [place] in the past year? 0 0 0 0 0  Number falls in past yr: 0 0 0  0  Injury with Fall? 0 0 0    Risk for fall due to : Medication  side effect No Fall Risks History of fall(s);No Fall Risks Medication side effect Impaired mobility;Impaired balance/gait;Medication side effect  Follow up Falls prevention discussed;Education provided;Falls evaluation completed Falls evaluation completed Falls evaluation completed Falls evaluation completed;Education provided;Falls prevention discussed Falls evaluation completed;Education provided;Falls prevention discussed    FALL RISK PREVENTION PERTAINING TO THE HOME:  Any stairs in or around the home? Yes  If so, are there any without handrails? No  Home free of loose throw rugs in walkways, pet beds, electrical cords, etc? Yes  Adequate lighting in your home to reduce risk of falls? Yes   ASSISTIVE DEVICES UTILIZED TO PREVENT FALLS:  Life alert? No  Use of a cane, walker or w/c? No  Grab bars in the bathroom? No  Shower chair or bench in shower? No  Elevated toilet seat or a handicapped toilet? Yes   TIMED UP AND GO:  Was the test performed? No .      Cognitive Function:        05/02/2022    8:26 AM 04/26/2021    9:02 AM 05/18/2020    9:45 AM  6CIT Screen  What Year? 0 points 0 points 0 points  What month? 0 points 0 points 0 points  What time? 0 points 0 points  0 points  Count back from 20 0 points 0 points 0 points  Months in reverse 0 points 4 points 0 points  Repeat phrase 0 points 0 points 4 points  Total Score 0 points 4 points 4 points    Immunizations Immunization History  Administered Date(s) Administered   Fluad Quad(high Dose 65+) 03/11/2019, 03/27/2022   Influenza, High Dose Seasonal PF 04/29/2018, 02/26/2021   Influenza-Unspecified 03/16/2020   PFIZER Comirnaty(Gray Top)Covid-19 Tri-Sucrose Vaccine 12/26/2020   PFIZER(Purple Top)SARS-COV-2 Vaccination 09/02/2019, 09/28/2019, 03/27/2020, 04/14/2021   Pfizer Covid-19 Vaccine Bivalent Booster 9yr & up 04/14/2021   Pneumococcal Conjugate-13 03/11/2019   Pneumococcal Polysaccharide-23 06/09/2017    Tdap 02/10/2021   Unspecified SARS-COV-2 Vaccination 04/13/2022   Zoster Recombinat (Shingrix) 07/12/2021    TDAP status: Up to date  Flu Vaccine status: Up to date  Pneumococcal vaccine status: Up to date  Covid-19 vaccine status: Completed vaccines  Qualifies for Shingles Vaccine? Yes   Zostavax completed No   Shingrix Completed?: needs second dose  Screening Tests Health Maintenance  Topic Date Due   Zoster Vaccines- Shingrix (2 of 2) 09/06/2021   Medicare Annual Wellness (AWV)  04/26/2022   MAMMOGRAM  03/21/2024   TETANUS/TDAP  02/11/2031   COLONOSCOPY (Pts [id number] coverage will need to be confirmed)  03/04/2032   Pneumonia Vaccine 6591Years old  Completed   INFLUENZA VACCINE  Completed   DEXA SCAN  Completed   COVID-19 Vaccine  Completed   Hepatitis C Screening  Completed   HPV VACCINES  Aged Out    Health Maintenance  Health Maintenance Due  Topic Date Due   Zoster Vaccines- Shingrix (2 of 2) 09/06/2021   Medicare Annual Wellness (AWV)  04/26/2022    Colorectal cancer screening: Type of screening: Colonoscopy. Completed 03/04/2022. Repeat every 3/10/21/08 years  Mammogram status: Completed 03/21/2022. Repeat every year  Bone Density status: Completed 02/09/2020.  Lung Cancer Screening: (Low Dose CT Chest recommended if Age 291-80ears, 30 pack-year currently smoking OR have quit w/in 15years.) does not qualify.   Lung Cancer Screening Referral: no  Additional Screening:  Hepatitis C Screening: does qualify; Completed 05/27/2018  Vision Screening: Recommended annual ophthalmology exams for early detection of glaucoma and other disorders of the eye. Is the patient up to date with their annual eye exam?  Yes  Who is the provider or what is the name of the office in which the patient attends annual eye exams? Dr. SkGenevie Annf pt is not established with a provider, would they like to be referred to a provider to establish care? No .   Dental Screening:  Recommended annual dental exams for proper oral hygiene  Community Resource Referral / Chronic Care Management: CRR required this visit?  No   CCM required this visit?  No      Plan:     I have personally reviewed and noted the following in the patient's chart:   Medical and social history Use of alcohol, tobacco or illicit drugs  Current medications and supplements including opioid prescriptions. Patient is not currently taking opioid prescriptions. Functional ability and status Nutritional status Physical activity Advanced directives List of other physicians Hospitalizations, surgeries, and ER visits in previous 12 months Vitals Screenings to include cognitive, depression, and falls Referrals and appointments  In addition, I have reviewed and discussed with patient certain preventive protocols, quality metrics, and best practice recommendations. A written personalized care plan for preventive services as well as general preventive health recommendations were provided to patient.  Kellie Simmering, LPN   [id number]   Nurse Notes: none  Due to this being a virtual visit, the after visit summary with patients personalized plan was offered to patient via mail or my-chart. Patient would like to access on my-chart

## 2022-05-02 NOTE — Patient Instructions (Signed)
Terri Barajas , Thank you for taking time to come for your Medicare Wellness Visit. I appreciate your ongoing commitment to your health goals. Please review the following plan we discussed and let me know if I can assist you in the future.   Screening recommendations/referrals: Colonoscopy: completed 03/04/2022 Mammogram: completed 03/21/2022, due 03/23/2023 Bone Density: completed 02/09/2020 Recommended yearly ophthalmology/optometry visit for glaucoma screening and checkup Recommended yearly dental visit for hygiene and checkup  Vaccinations: Influenza vaccine: completed 03/27/2022 Pneumococcal vaccine: completed 03/11/2019 Tdap vaccine: completed 02/10/2021, due 02/11/2031 Shingles vaccine: needs second dose   Covid-19: 04/13/2022, 04/14/2021, 12/26/2020, 03/27/2020, 09/28/2019, 09/02/2019  Advanced directives: Please bring a copy of your POA (Power of Attorney) and/or Living Will to your next appointment.   Conditions/risks identified: none  Next appointment: Follow up in one year for your annual wellness visit    Preventive Care 65 Years and Older, Female Preventive care refers to lifestyle choices and visits with your health care provider that can promote health and wellness. What does preventive care include? A yearly physical exam. This is also called an annual well check. Dental exams once or twice a year. Routine eye exams. Ask your health care provider how often you should have your eyes checked. Personal lifestyle choices, including: Daily care of your teeth and gums. Regular physical activity. Eating a healthy diet. Avoiding tobacco and drug use. Limiting alcohol use. Practicing safe sex. Taking low-dose aspirin every day. Taking vitamin and mineral supplements as recommended by your health care provider. What happens during an annual well check? The services and screenings done by your health care provider during your annual well check will depend on your age, overall  health, lifestyle risk factors, and family history of disease. Counseling  Your health care provider may ask you questions about your: Alcohol use. Tobacco use. Drug use. Emotional well-being. Home and relationship well-being. Sexual activity. Eating habits. History of falls. Memory and ability to understand (cognition). Work and work Statistician. Reproductive health. Screening  You may have the following tests or measurements: Height, weight, and BMI. Blood pressure. Lipid and cholesterol levels. These may be checked every 5 years, or more frequently if you are over 23 years old. Skin check. Lung cancer screening. You may have this screening every year starting at age 25 if you have a 30-pack-year history of smoking and currently smoke or have quit within the past 15 years. Fecal occult blood test (FOBT) of the stool. You may have this test every year starting at age 49. Flexible sigmoidoscopy or colonoscopy. You may have a sigmoidoscopy every 5 years or a colonoscopy every 10 years starting at age 15. Hepatitis C blood test. Hepatitis B blood test. Sexually transmitted disease (STD) testing. Diabetes screening. This is done by checking your blood sugar (glucose) after you have not eaten for a while (fasting). You may have this done every 1-3 years. Bone density scan. This is done to screen for osteoporosis. You may have this done starting at age 50. Mammogram. This may be done every 1-2 years. Talk to your health care provider about how often you should have regular mammograms. Talk with your health care provider about your test results, treatment options, and if necessary, the need for more tests. Vaccines  Your health care provider may recommend certain vaccines, such as: Influenza vaccine. This is recommended every year. Tetanus, diphtheria, and acellular pertussis (Tdap, Td) vaccine. You may need a Td booster every 10 years. Zoster vaccine. You may need this after age  60. Pneumococcal 13-valent conjugate (PCV13) vaccine. One dose is recommended after age 47. Pneumococcal polysaccharide (PPSV23) vaccine. One dose is recommended after age 53. Talk to your health care provider about which screenings and vaccines you need and how often you need them. This information is not intended to replace advice given to you by your health care provider. Make sure you discuss any questions you have with your health care provider. Document Released: 06/30/2015 Document Revised: 02/21/2016 Document Reviewed: 04/04/2015 Elsevier Interactive Patient Education  2017 [place] Prevention in the Home Falls can cause injuries. They can happen to people of all ages. There are many things you can do to make your home safe and to help prevent falls. What can I do on the outside of my home? Regularly fix the edges of walkways and driveways and fix any cracks. Remove anything that might make you trip as you walk through a door, such as a raised step or threshold. Trim any bushes or trees on the path to your home. Use bright outdoor lighting. Clear any walking paths of anything that might make someone trip, such as rocks or tools. Regularly check to see if handrails are loose or broken. Make sure that both sides of any steps have handrails. Any raised decks and porches should have guardrails on the edges. Have any leaves, snow, or ice cleared regularly. Use sand or salt on walking paths during winter. Clean up any spills in your garage right away. This includes oil or grease spills. What can I do in the bathroom? Use night lights. Install grab bars by the toilet and in the tub and shower. Do not use towel bars as grab bars. Use non-skid mats or decals in the tub or shower. If you need to sit down in the shower, use a plastic, non-slip stool. Keep the floor dry. Clean up any water that spills on the floor as soon as it happens. Remove soap buildup in the tub or shower  regularly. Attach bath mats securely with double-sided non-slip rug tape. Do not have throw rugs and other things on the floor that can make you trip. What can I do in the bedroom? Use night lights. Make sure that you have a light by your bed that is easy to reach. Do not use any sheets or blankets that are too big for your bed. They should not hang down onto the floor. Have a firm chair that has side arms. You can use this for support while you get dressed. Do not have throw rugs and other things on the floor that can make you trip. What can I do in the kitchen? Clean up any spills right away. Avoid walking on wet floors. Keep items that you use a lot in easy-to-reach places. If you need to reach something above you, use a strong step stool that has a grab bar. Keep electrical cords out of the way. Do not use floor polish or wax that makes floors slippery. If you must use wax, use non-skid floor wax. Do not have throw rugs and other things on the floor that can make you trip. What can I do with my stairs? Do not leave any items on the stairs. Make sure that there are handrails on both sides of the stairs and use them. Fix handrails that are broken or loose. Make sure that handrails are as long as the stairways. Check any carpeting to make sure that it is firmly attached to the stairs. Fix any  carpet that is loose or worn. Avoid having throw rugs at the top or bottom of the stairs. If you do have throw rugs, attach them to the floor with carpet tape. Make sure that you have a light switch at the top of the stairs and the bottom of the stairs. If you do not have them, ask someone to add them for you. What else can I do to help prevent falls? Wear shoes that: Do not have high heels. Have rubber bottoms. Are comfortable and fit you well. Are closed at the toe. Do not wear sandals. If you use a stepladder: Make sure that it is fully opened. Do not climb a closed stepladder. Make sure that  both sides of the stepladder are locked into place. Ask someone to hold it for you, if possible. Clearly mark and make sure that you can see: Any grab bars or handrails. First and last steps. Where the edge of each step is. Use tools that help you move around (mobility aids) if they are needed. These include: Canes. Walkers. Scooters. Crutches. Turn on the lights when you go into a dark area. Replace any light bulbs as soon as they burn out. Set up your furniture so you have a clear path. Avoid moving your furniture around. If any of your floors are uneven, fix them. If there are any pets around you, be aware of where they are. Review your medicines with your doctor. Some medicines can make you feel dizzy. This can increase your chance of falling. Ask your doctor what other things that you can do to help prevent falls. This information is not intended to replace advice given to you by your health care provider. Make sure you discuss any questions you have with your health care provider. Document Released: 03/30/2009 Document Revised: 11/09/2015 Document Reviewed: 07/08/2014 Elsevier Interactive Patient Education  2017 [employer].

## 2022-05-14 DIAGNOSIS — Z9889 Other specified postprocedural states: Secondary | ICD-10-CM | POA: Insufficient documentation

## 2022-05-15 DIAGNOSIS — Z9889 Other specified postprocedural states: Secondary | ICD-10-CM | POA: Diagnosis not present

## 2022-05-16 IMAGING — MG DIGITAL SCREENING BILAT W/ CAD
4 series · 4 of 4 positions shown · non-contrast
Comparison: Previous exam(s).

CLINICAL DATA: Screening.

EXAM:
DIGITAL SCREENING BILATERAL MAMMOGRAM WITH CAD

[L MLO]
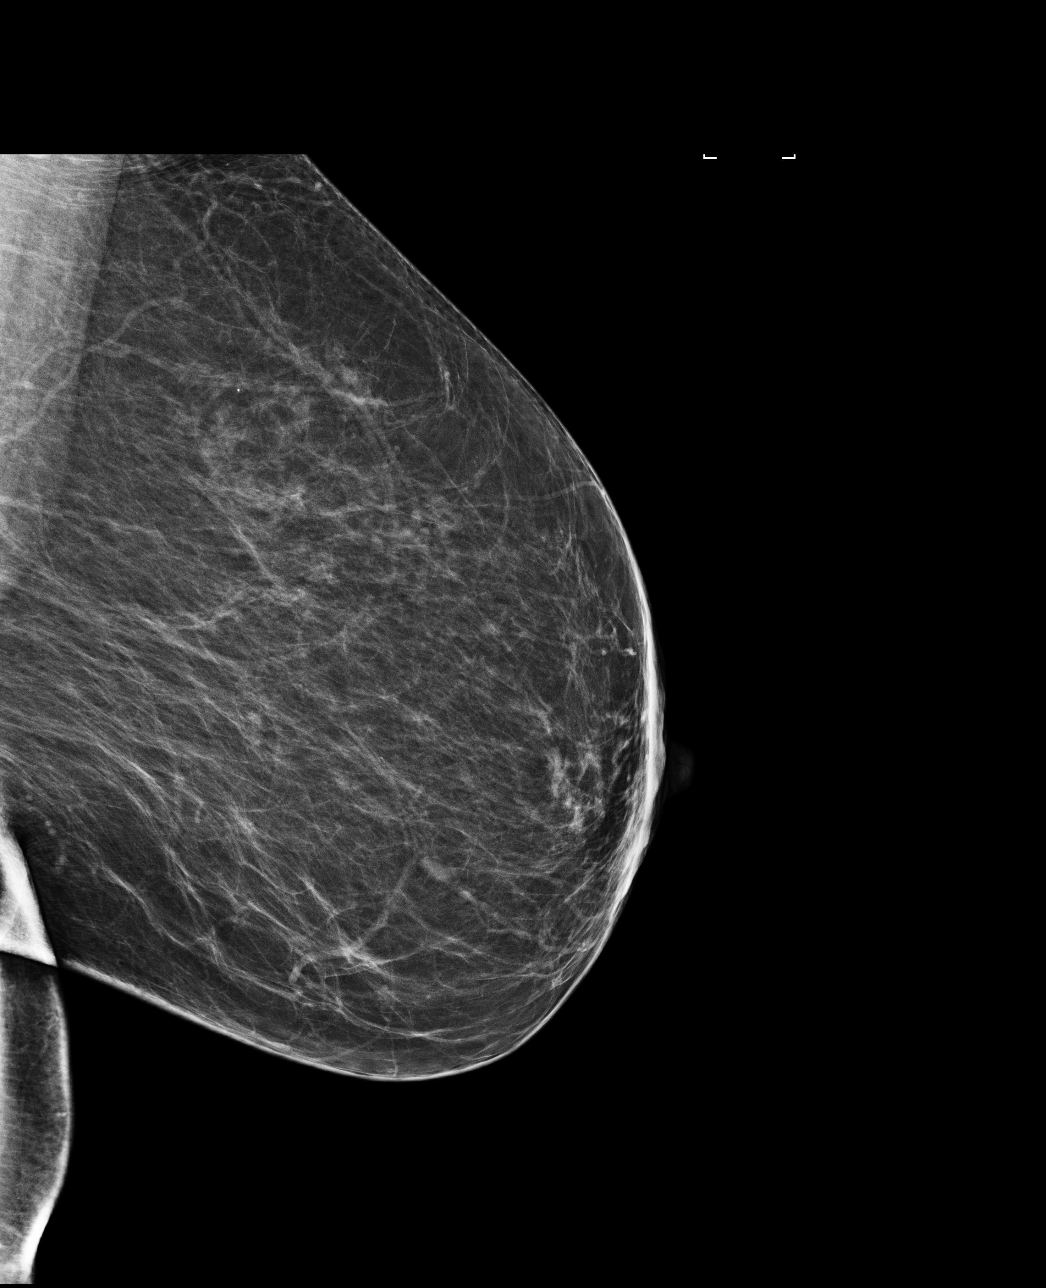

[R MLO]
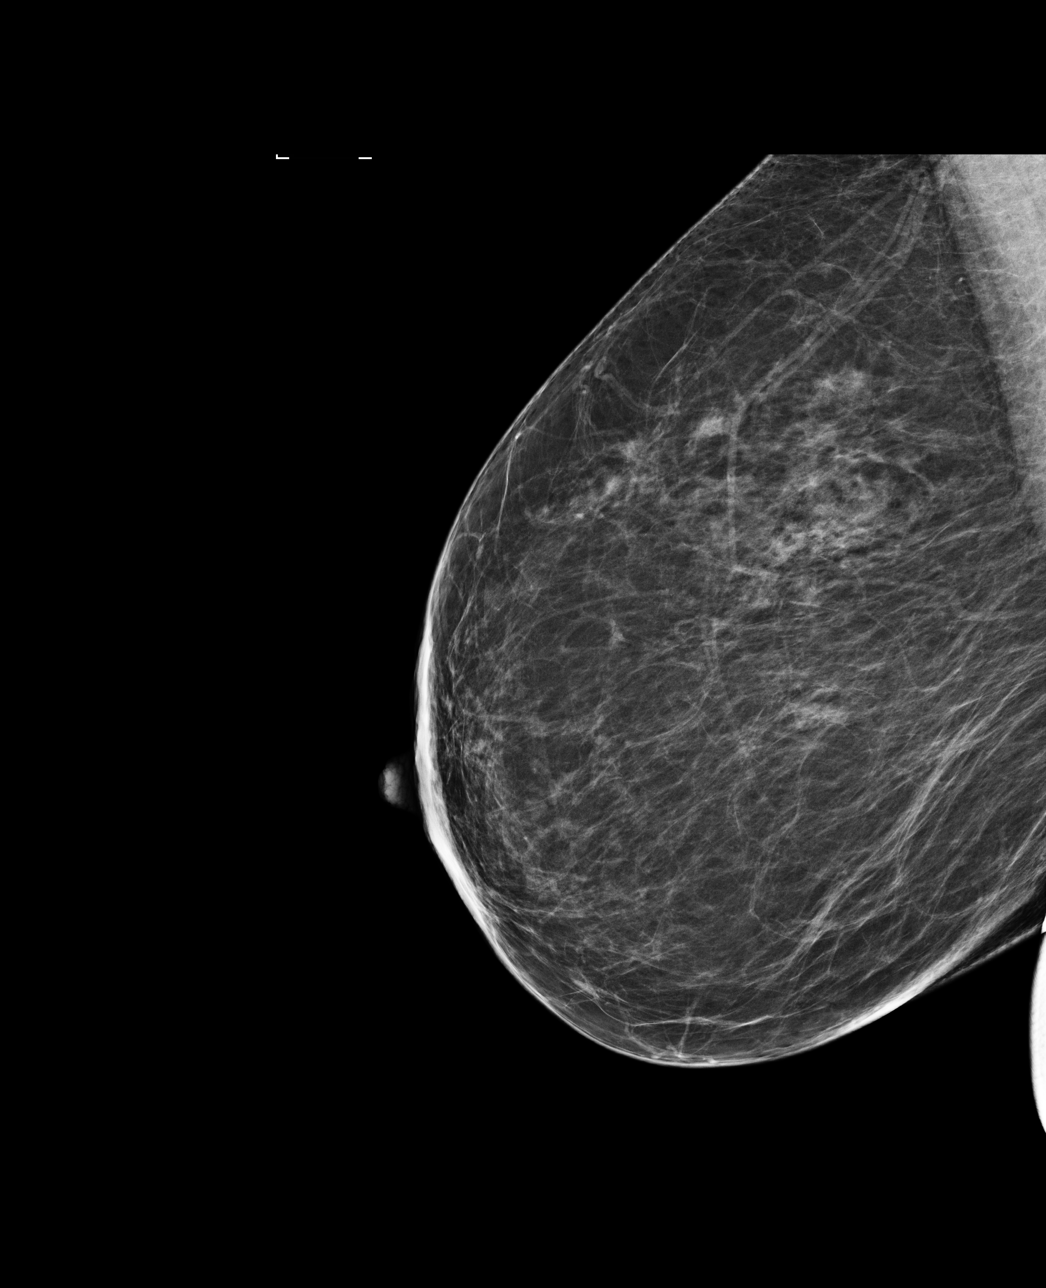

[R CC]
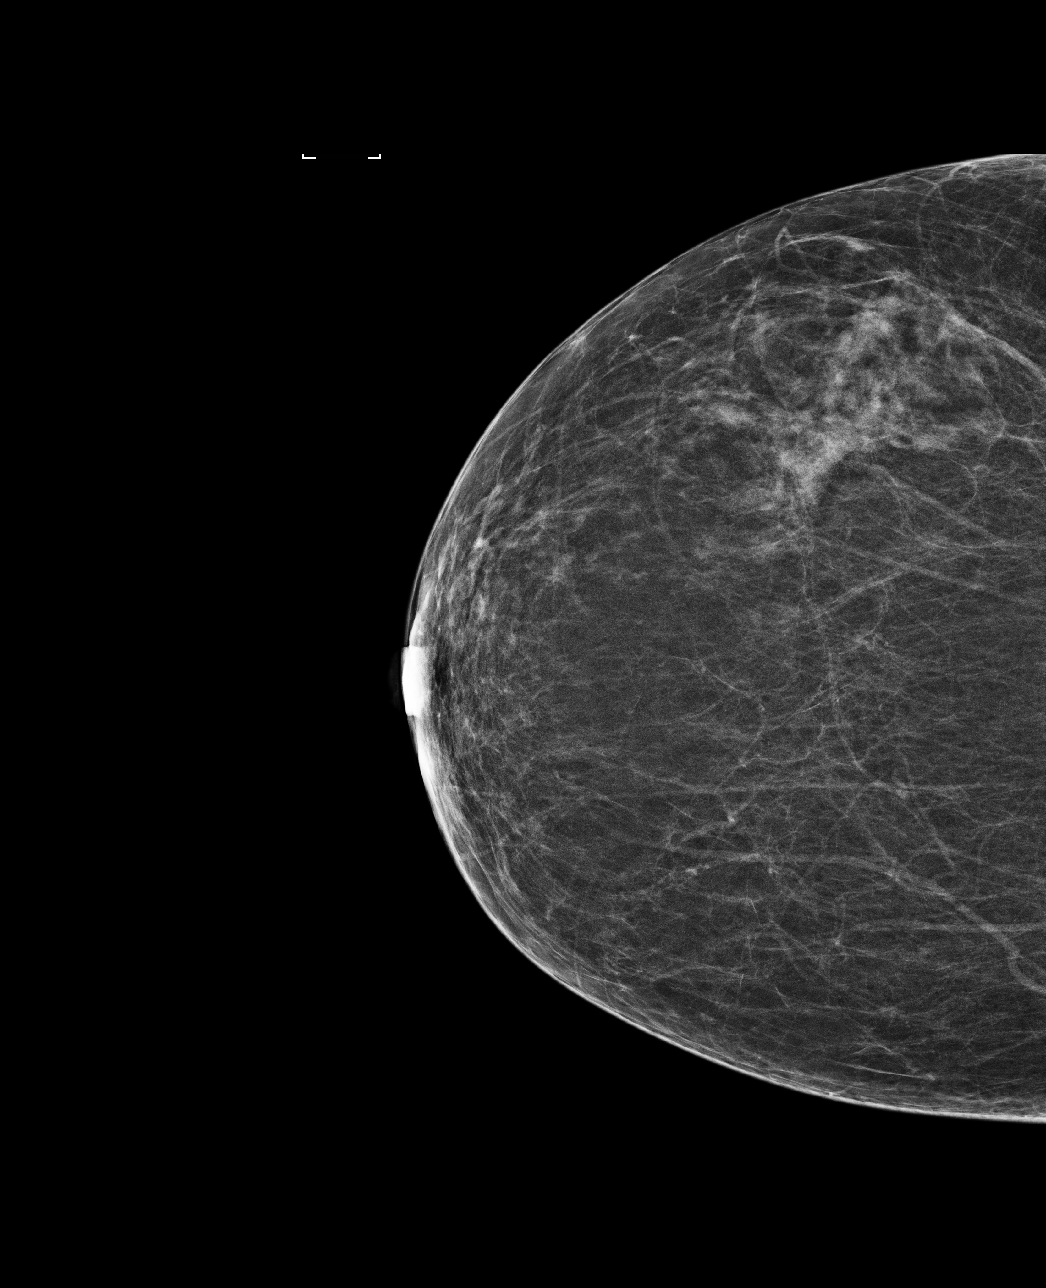

[L CC]
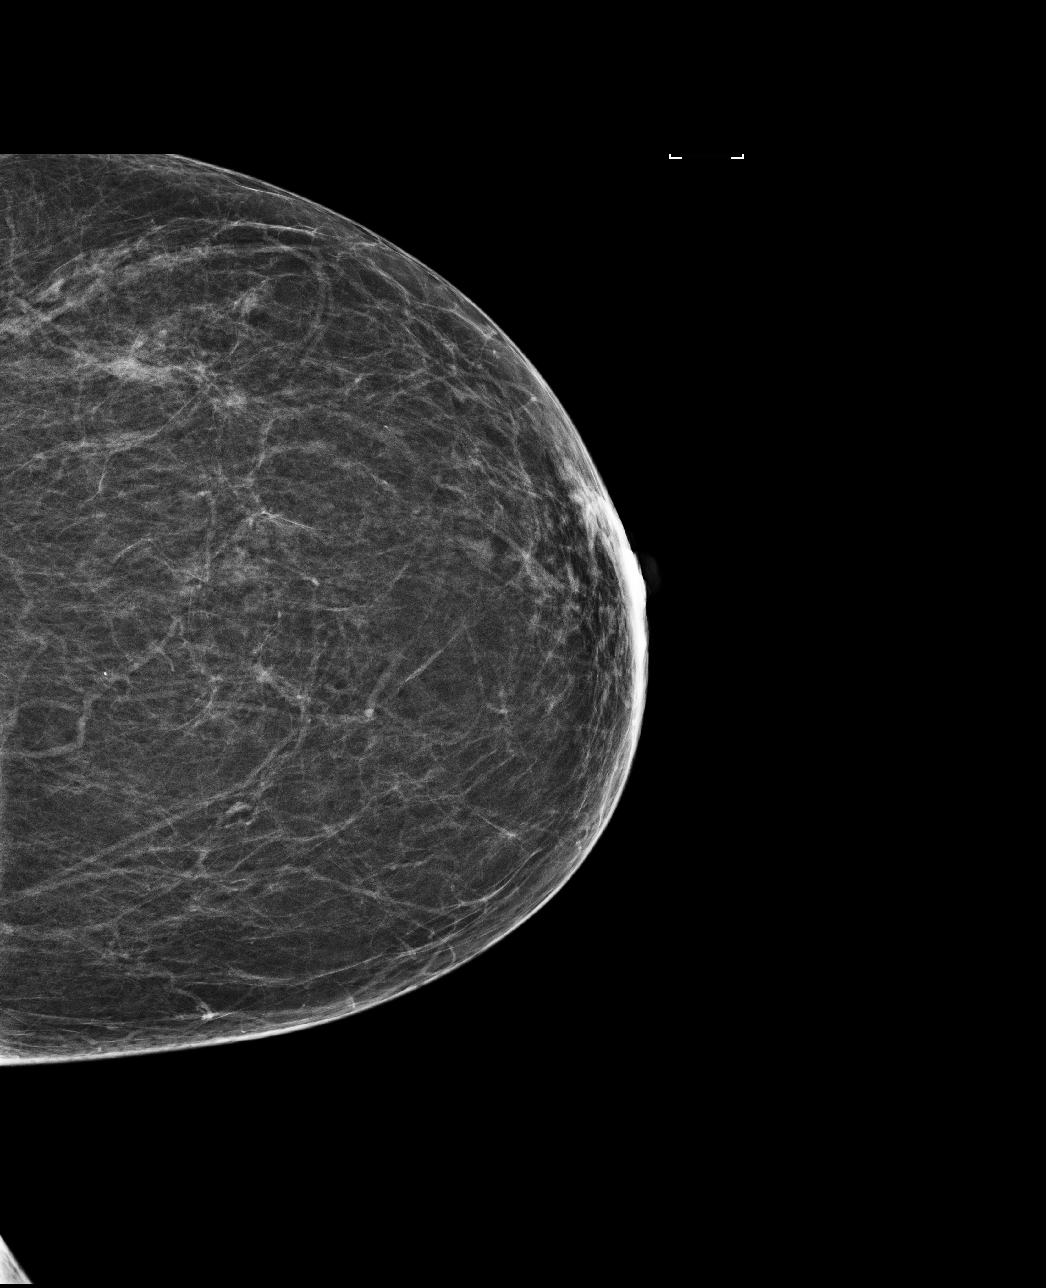

[4 of 4 positions shown; findings below may reference images not displayed]

ACR Breast Density Category b: There are scattered areas of
fibroglandular density.
FINDINGS: There are no findings suspicious for malignancy. Images were
processed with CAD.
IMPRESSION: No mammographic evidence of malignancy. A result letter of this
screening mammogram will be mailed directly to the patient.

RECOMMENDATION:
Screening mammogram in one year. (Code:AS-G-LCT)

BI-RADS CATEGORY  1: Negative.

## 2022-06-20 DIAGNOSIS — Z9889 Other specified postprocedural states: Secondary | ICD-10-CM | POA: Diagnosis not present

## 2022-06-20 DIAGNOSIS — M1712 Unilateral primary osteoarthritis, left knee: Secondary | ICD-10-CM | POA: Diagnosis not present

## 2022-07-04 DIAGNOSIS — M1712 Unilateral primary osteoarthritis, left knee: Secondary | ICD-10-CM | POA: Diagnosis not present

## 2022-07-05 ENCOUNTER — Other Ambulatory Visit (HOSPITAL_COMMUNITY): Payer: Self-pay

## 2022-07-11 DIAGNOSIS — M1712 Unilateral primary osteoarthritis, left knee: Secondary | ICD-10-CM | POA: Diagnosis not present

## 2022-07-18 DIAGNOSIS — M1712 Unilateral primary osteoarthritis, left knee: Secondary | ICD-10-CM | POA: Diagnosis not present

## 2022-07-22 ENCOUNTER — Ambulatory Visit (INDEPENDENT_AMBULATORY_CARE_PROVIDER_SITE_OTHER): Payer: Medicare Other | Admitting: Nurse Practitioner

## 2022-07-22 ENCOUNTER — Encounter: Payer: Self-pay | Admitting: Nurse Practitioner

## 2022-07-22 VITALS — BP 126/80 | HR 71 | Temp 98.6°F | Ht 63.0 in | Wt 150.0 lb

## 2022-07-22 DIAGNOSIS — R7303 Prediabetes: Secondary | ICD-10-CM

## 2022-07-22 DIAGNOSIS — I10 Essential (primary) hypertension: Secondary | ICD-10-CM | POA: Diagnosis not present

## 2022-07-22 DIAGNOSIS — R29898 Other symptoms and signs involving the musculoskeletal system: Secondary | ICD-10-CM | POA: Diagnosis not present

## 2022-07-22 DIAGNOSIS — I700 Atherosclerosis of aorta: Secondary | ICD-10-CM | POA: Diagnosis not present

## 2022-07-22 DIAGNOSIS — J849 Interstitial pulmonary disease, unspecified: Secondary | ICD-10-CM

## 2022-07-22 DIAGNOSIS — E782 Mixed hyperlipidemia: Secondary | ICD-10-CM | POA: Diagnosis not present

## 2022-07-22 DIAGNOSIS — G720 Drug-induced myopathy: Secondary | ICD-10-CM

## 2022-07-22 DIAGNOSIS — Z23 Encounter for immunization: Secondary | ICD-10-CM

## 2022-07-22 DIAGNOSIS — T466X5A Adverse effect of antihyperlipidemic and antiarteriosclerotic drugs, initial encounter: Secondary | ICD-10-CM | POA: Diagnosis not present

## 2022-07-22 NOTE — Progress Notes (Signed)
I,[name],acting as a Education administrator for [employer], FNP.,have documented all relevant documentation on the behalf of Terri Brine, FNP,as directed by  Terri Brine, FNP while in the presence of Terri Barajas, [place].  Subjective:     Patient ID: Terri Barajas , female    DOB: 21-Jun-1945 , 77 y.o.   MRN: [id number]   Chief Complaint  Patient presents with   Hypertension    HPI  The patient is here today for a htn and cholesterol follow-up.  She had gel injections to her left knee (3) to see how that works and is to f/u in 6 weeks - may need to have a knee replacement.  Both legs at times feel tired. Sometimes she has burning to her hands (has burning on the top of her hands).   She is on Repatha. She has an intolerance to statin  Hypertension This is a chronic problem. The current episode started more than 1 year ago. The problem is uncontrolled. Pertinent negatives include no chest pain or palpitations. There is no history of chronic renal disease.  Hyperlipidemia This is a chronic problem. The current episode started more than 1 year ago. The problem is uncontrolled. She has no history of chronic renal disease. Pertinent negatives include no chest pain. Current antihyperlipidemic treatment includes statins and diet change. Risk factors for coronary artery disease include obesity and a sedentary lifestyle.     Past Medical History:  Diagnosis Date   Anxiety    Arthritis    Complication of anesthesia    some type of sedation medication made her feel crazy   Dysrhythmia    Family history of adverse reaction to anesthesia    daughter has Malignant Hyperthermia hx -    GERD (gastroesophageal reflux disease)    Glaucoma    H/O: hysterectomy 1978   Heart disease    History of hiatal hernia    Hypertension    MVP (mitral valve prolapse)    Panic attacks    Pre-diabetes    Sciatica      Family History  Problem Relation Age of Onset   Pancreatic cancer Mother    Alcohol abuse  Father    Asthma Sister    Hypertension Sister    Arthritis Sister    Diabetes Brother    Arthritis Brother    Hyperlipidemia Brother    Hypertension Brother    Glaucoma Brother    Colon polyps Brother    Arthritis Sister    Arthritis Sister    Prostate cancer Brother    Congenital heart disease Brother    Colon cancer Neg Hx    Esophageal cancer Neg Hx    Inflammatory bowel disease Neg Hx    Liver disease Neg Hx    Rectal cancer Neg Hx    Stomach cancer Neg Hx      Current Outpatient Medications:    acetaminophen (TYLENOL) 500 MG tablet, Take 500-1,000 mg by mouth every 6 (six) hours as needed for moderate pain., Disp: , Rfl:    ALPRAZolam (XANAX) 0.25 MG tablet, Take 1 tablet (0.25 mg total) by mouth daily as needed for anxiety., Disp: 30 tablet, Rfl: 0   ascorbic acid (VITAMIN C) 500 MG tablet, Take 500 mg by mouth daily., Disp: , Rfl:    aspirin 81 MG chewable tablet, Chew 81 mg by mouth daily., Disp: , Rfl:    Biotin 5 MG CAPS, Take 5 mg by mouth daily., Disp: , Rfl:    calcium  carbonate (OSCAL) 1500 (600 Ca) MG TABS tablet, Take 600 mg of elemental calcium by mouth daily., Disp: , Rfl:    cholecalciferol (VITAMIN D3) 25 MCG (1000 UNIT) tablet, Take 3,000 Units by mouth daily., Disp: , Rfl:    diltiazem (CARDIZEM) 30 MG tablet, TAKE 1 TABLET BY MOUTH  TWICE DAILY, Disp: 180 tablet, Rfl: 3   dorzolamide-timolol (COSOPT) 22.3-6.8 MG/ML ophthalmic solution, Apply 1 drop to eye in the morning and at bedtime., Disp: , Rfl:    Evolocumab (REPATHA SURECLICK) [id number] MG/ML SOAJ, Inject 140 mg into the skin every 14 (fourteen) days., Disp: 2 mL, Rfl: 11   fluticasone (FLONASE) 50 MCG/ACT nasal spray, Place 1 spray into both nostrils daily., Disp: 16 g, Rfl: 0   hydrochlorothiazide (HYDRODIURIL) 25 MG tablet, Take 1 tablet (25 mg total) by mouth daily., Disp: 90 tablet, Rfl: 2   ibuprofen (ADVIL) 200 MG tablet, Take 200-400 mg by mouth every 6 (six) hours as needed for moderate pain.,  Disp: , Rfl:    latanoprost (XALATAN) 0.005 % ophthalmic solution, Place 1 drop into both eyes at bedtime. , Disp: , Rfl:    loratadine (CLARITIN REDITABS) 10 MG dissolvable tablet, Take 10 mg by mouth daily as needed for allergies., Disp: , Rfl:    Multiple Vitamins-Minerals (CENTRUM SILVER 50+WOMEN PO), Take 1 tablet by mouth daily., Disp: , Rfl:    Omega-3 Fatty Acids (FISH OIL PO), Take 1 capsule by mouth daily., Disp: , Rfl:    omeprazole (PRILOSEC) 20 MG capsule, Take 20 mg by mouth 2 (two) times daily before a meal., Disp: , Rfl:    polyethylene glycol (MIRALAX / GLYCOLAX) packet, Take 17 g by mouth daily as needed for moderate constipation., Disp: , Rfl:    Probiotic Product (ALIGN PO), Take 1 capsule by mouth daily., Disp: , Rfl:    RHOPRESSA 0.02 % SOLN, Place 1 drop into both eyes every evening., Disp: , Rfl:    vitamin E 180 MG (400 UNITS) capsule, Take 400 Units by mouth daily., Disp: , Rfl:    Allergies  Allergen Reactions   Shellfish Allergy Anaphylaxis and Shortness Of Breath   Atorvastatin     Myalgias    Atracurium & Derivatives Other (See Comments)    Unknown   Atropine Other (See Comments)    Heart racing   Iodine Other (See Comments)    Unknown   Statins     "didn't feel right"      Review of Systems  Constitutional: Negative.   Respiratory: Negative.    Cardiovascular: Negative.  Negative for chest pain and palpitations.  Gastrointestinal: Negative.   Neurological: Negative.   Psychiatric/Behavioral: Negative.       Today's Vitals   07/22/22 1008  BP: 126/80  Pulse: 71  Temp: 98.6 F (37 C)  TempSrc: Oral  Weight: 150 lb (68 kg)  Height: 5' 3"$  (1.6 m)   Body mass index is 26.57 kg/m.   Objective:  Physical Exam Vitals reviewed.  Constitutional:      General: She is not in acute distress.    Appearance: Normal appearance.  Cardiovascular:     Rate and Rhythm: Normal rate and regular rhythm.     Pulses: Normal pulses.     Heart sounds:  Normal heart sounds. No murmur heard. Pulmonary:     Effort: Pulmonary effort is normal. No respiratory distress.     Breath sounds: Normal breath sounds. No wheezing.  Abdominal:     General: Abdomen is  flat. Bowel sounds are normal. There is no distension.     Palpations: Abdomen is soft.     Tenderness: There is no abdominal tenderness.  Musculoskeletal:        General: No swelling, tenderness or deformity. Normal range of motion.     Cervical back: No tenderness.  Skin:    General: Skin is warm and dry.     Capillary Refill: Capillary refill takes less than 2 seconds.  Neurological:     General: No focal deficit present.     Mental Status: She is alert and oriented to person, place, and time.     Cranial Nerves: No cranial nerve deficit.     Motor: No weakness.  Psychiatric:        Mood and Affect: Mood normal.        Behavior: Behavior normal.        Thought Content: Thought content normal.        Judgment: Judgment normal.         Assessment And Plan:     1. Essential hypertension - BMP8+eGFR  2. Prediabetes Comments: Stable, continue focusing on healthy diet low in sugar and starches. Increase physical activity to at least 150 minutes/week - BMP8+eGFR  3. Mixed hyperlipidemia Comments: She does not tolerate statin, continue Repatha - BMP8+eGFR - Lipid panel  4. Leg fatigue Comments: Will check iron studies and encouraged to wear support socks during the day - Vitamin B12 - CBC - Iron, TIBC and Ferritin Panel  5. Statin myopathy  6. Aortic atherosclerosis (HCC) Comments: ASCVD 10 year risk score is 27.3%, she is unable to tolerate a statin, continue Repatha - Lipid panel  7. Need for shingles vaccine Comments: Shingrix #2 given in office, TransRx signed - Zoster Recombinant (Shingrix )  8. Interstitial pulmonary disease (HCC) Comments: Stable, no current issues   The 10-year ASCVD risk score (Arnett DK, et al., 2019) is: 27.3%   Values used to  calculate the score:     Age: 84 years     Sex: Female     Is Non-Hispanic African American: Yes     Diabetic: Yes     Tobacco smoker: No     Systolic Blood Pressure: 123XX123 mmHg     Is BP treated: Yes     HDL Cholesterol: 63 mg/dL     Total Cholesterol: 163 mg/dL  Patient was given opportunity to ask questions. Patient verbalized understanding of the plan and was able to repeat key elements of the plan. All questions were answered to their satisfaction.  Terri Brine, FNP   I, Terri Brine, FNP, have reviewed all documentation for this visit. The documentation on 07/22/22 for the exam, diagnosis, procedures, and orders are all accurate and complete.   IF YOU HAVE BEEN REFERRED TO A SPECIALIST, IT MAY TAKE 1-2 WEEKS TO SCHEDULE/PROCESS THE REFERRAL. IF YOU HAVE NOT HEARD FROM US/SPECIALIST IN TWO WEEKS, PLEASE GIVE [place] A CALL AT [phone] X [phone].   THE PATIENT IS ENCOURAGED TO PRACTICE SOCIAL DISTANCING DUE TO THE COVID-19 PANDEMIC.

## 2022-07-23 LAB — BMP8+EGFR
BUN/Creatinine Ratio: 30 — ABNORMAL HIGH (ref 12–28)
BUN: 21 mg/dL (ref 8–27)
CO2: 29 mmol/L (ref 20–29)
Calcium: 9.9 mg/dL (ref 8.7–10.3)
Chloride: 100 mmol/L (ref 96–106)
Creatinine, Ser: 0.71 mg/dL (ref 0.57–1.00)
Glucose: 105 mg/dL — ABNORMAL HIGH (ref 70–99)
Potassium: 4 mmol/L (ref 3.5–5.2)
Sodium: 140 mmol/L (ref 134–144)
eGFR: 89 mL/min/{1.73_m2} (ref >59)

## 2022-07-23 LAB — VITAMIN B12: Vitamin B-12: 882 pg/mL (ref 232–1245)

## 2022-07-23 LAB — LIPID PANEL
Chol/HDL Ratio: 2.6 ratio (ref 0.0–4.4)
Cholesterol, Total: 163 mg/dL (ref 100–199)
HDL: 63 mg/dL (ref >39)
LDL Chol Calc (NIH): 83 mg/dL (ref 0–99)
Triglycerides: 94 mg/dL (ref 0–149)
VLDL Cholesterol Cal: 17 mg/dL (ref 5–40)

## 2022-07-23 LAB — CBC
Hematocrit: 39.9 % (ref 34.0–46.6)
Hemoglobin: 12.9 g/dL (ref 11.1–15.9)
MCH: 29.9 pg (ref 26.6–33.0)
MCHC: 32.3 g/dL (ref 31.5–35.7)
MCV: 92 fL (ref 79–97)
Platelets: 251 10*3/uL (ref 150–450)
RBC: 4.32 x10E6/uL (ref 3.77–5.28)
RDW: 12.3 % (ref 11.7–15.4)
WBC: 5.5 10*3/uL (ref 3.4–10.8)

## 2022-07-23 LAB — IRON,TIBC AND FERRITIN PANEL
Ferritin: 183 ng/mL — ABNORMAL HIGH (ref 15–150)
Iron Saturation: 20 % (ref 15–55)
Iron: 73 ug/dL (ref 27–139)
Total Iron Binding Capacity: 358 ug/dL (ref 250–450)
UIBC: 285 ug/dL (ref 118–369)

## 2022-07-28 DIAGNOSIS — G4733 Obstructive sleep apnea (adult) (pediatric): Secondary | ICD-10-CM | POA: Diagnosis not present

## 2022-07-29 ENCOUNTER — Encounter: Payer: Self-pay | Admitting: Nurse Practitioner

## 2022-07-29 DIAGNOSIS — E1169 Type 2 diabetes mellitus with other specified complication: Secondary | ICD-10-CM | POA: Insufficient documentation

## 2022-07-29 DIAGNOSIS — R7303 Prediabetes: Secondary | ICD-10-CM | POA: Insufficient documentation

## 2022-08-05 NOTE — Progress Notes (Unsigned)
Cardiology Office Note:    Date:  08/06/2022   ID:  Terri Barajas, Terri Barajas 04-15-1946, MRN [id number]  PCP:  [name], [place] Cardiologist:  Skeet Latch, MD     Referring MD: [name], FNP   No chief complaint on file.   History of Present Illness:    Terri Barajas is a 77 y.o. female with a hx of hypertension, aortic atherosclerosis, MVP, OSA, GERD, Barrett's esophagus, statin myopathy, HLD.  She initially establish care with our office in January 2020 for an abnormal EKG.  She has a long history of MVP, and palpitations and was previously seeing a cardiologist in [place] before moving to [place].  Outside records were reviewed, echo from 2019 revealed EF 60 to 65%, trivial MR, mild TR, no mitral valve prolapse noted on this echo.  She reports a stress test in 2019 but does not recall the results of it.  She was scheduled to have a coronary CTA however it could not be completed due to tachycardia, subsequently she had a Lexi scan that revealed an EF of 64% and no ischemia.  She had a calcium score in May 2022 which was 0.  She had a high resolution CT of her chest in the same year which revealed aortic atherosclerosis.  She was last evaluated in our office on 03/08/2021 by Dr. Oval Linsey, at that time she was feeling poor for a few months citing no energy, she herself questioned if she could be struggling with depression.  She met with PharmD on 03/27/2021, her LDL was 144, however she is intolerant to statins.  Repatha was started.   She presents today for follow-up of her hypertension.  She states she overall does not feel great, she has persistent headaches, is intermittently nauseated, also has some left upper chest pain with associated left arm heaviness.  The chest pain and arm heaviness have been going on for a few weeks, she describes the pain in her chest as typically constant throughout the day.  Sometimes the pain is  relieved with OTC analgesics or stretching of her left arm. She occasionally has palpitations as well, these are not overly bothersome for her though.  She had knee surgery in the fall and is still recovering from that, she has not been able to resume walking yet.  She denies dyspnea, pnd, orthopnea, n, v, dizziness, syncope, edema, weight gain, or early satiety.    Past Medical History:  Diagnosis Date   Anxiety    Arthritis    Complication of anesthesia    some type of sedation medication made her feel crazy   Dysrhythmia    Family history of adverse reaction to anesthesia    daughter has Malignant Hyperthermia hx -    GERD (gastroesophageal reflux disease)    Glaucoma    H/O: hysterectomy 1978   Heart disease    History of hiatal hernia    Hypertension    MVP (mitral valve prolapse)    Panic attacks    Pre-diabetes    Sciatica     Past Surgical History:  Procedure Laterality Date   APPENDECTOMY  1972   BACK SURGERY  07/14/2020   chipped disc   BIOPSY  07/06/2018   Procedure: BIOPSY;  Surgeon: Irving Copas., MD;  Location[facility];  Service: Gastroenterology;;   BIOPSY  03/04/2022   Procedure: BIOPSY;  Surgeon: Irving Copas., MD;  Location: [facility] ENDOSCOPY;  Service: Gastroenterology;;  CATARACT EXTRACTION  2013   CESAREAN SECTION  1976 and 1978   COLONOSCOPY     COLONOSCOPY WITH PROPOFOL N/A 03/04/2022   Procedure: COLONOSCOPY WITH PROPOFOL;  Surgeon: Rush Landmark Telford Nab., MD;  Location: [facility] ENDOSCOPY;  Service: Gastroenterology;  Laterality: N/A;   ESOPHAGOGASTRODUODENOSCOPY (EGD) WITH PROPOFOL N/A 07/06/2018   Procedure: ESOPHAGOGASTRODUODENOSCOPY (EGD) WITH PROPOFOL;  Surgeon: Rush Landmark Telford Nab., MD;  Location: [facility];  Service: Gastroenterology;  Laterality: N/A;   ESOPHAGOGASTRODUODENOSCOPY (EGD) WITH PROPOFOL N/A 03/04/2022   Procedure: ESOPHAGOGASTRODUODENOSCOPY (EGD) WITH PROPOFOL;  Surgeon: Rush Landmark Telford Nab., MD;   Location: [facility] ENDOSCOPY;  Service: Gastroenterology;  Laterality: N/A;   HAND SURGERY Bilateral    HEMORRHOID SURGERY  2018   KNEE CARTILAGE SURGERY Left 01/28/2022   POLYPECTOMY  03/04/2022   Procedure: POLYPECTOMY;  Surgeon: Mansouraty, Telford Nab., MD;  Location: [facility];  Service: Gastroenterology;;   S/P Hysterectomy   1987   SAVORY DILATION N/A 03/04/2022   Procedure: Azzie Almas DILATION;  Surgeon: Irving Copas., MD;  Location: [facility]OS[facility];  Service: Gastroenterology;  Laterality: N/A;    Current Medications: Current Meds  Medication Sig   acetaminophen (TYLENOL) 500 MG tablet Take 500-1,000 mg by mouth every 6 (six) hours as needed for moderate pain.   ALPRAZolam (XANAX) 0.25 MG tablet Take 1 tablet (0.25 mg total) by mouth daily as needed for anxiety.   ascorbic acid (VITAMIN C) 500 MG tablet Take 500 mg by mouth daily.   aspirin 81 MG chewable tablet Chew 81 mg by mouth daily.   Biotin 5 MG CAPS Take 5 mg by mouth daily.   calcium carbonate (OSCAL) 1500 (600 Ca) MG TABS tablet Take 600 mg of elemental calcium by mouth daily.   cholecalciferol (VITAMIN D3) 25 MCG (1000 UNIT) tablet Take 3,000 Units by mouth daily.   diltiazem (CARDIZEM) 30 MG tablet TAKE 1 TABLET BY MOUTH  TWICE DAILY   dorzolamide-timolol (COSOPT) 22.3-6.8 MG/ML ophthalmic solution Apply 1 drop to eye in the morning and at bedtime.   Evolocumab (REPATHA SURECLICK) XX123456 MG/ML SOAJ Inject 140 mg into the skin every 14 (fourteen) days.   fluticasone (FLONASE) 50 MCG/ACT nasal spray Place 1 spray into both nostrils daily.   hydrochlorothiazide (HYDRODIURIL) 25 MG tablet Take 1 tablet (25 mg total) by mouth daily.   ibuprofen (ADVIL) 200 MG tablet Take 200-400 mg by mouth every 6 (six) hours as needed for moderate pain.   latanoprost (XALATAN) 0.005 % ophthalmic solution Place 1 drop into both eyes at bedtime.    loratadine (CLARITIN REDITABS) 10 MG dissolvable tablet Take 10 mg by mouth daily as needed for  allergies.   metoprolol tartrate (LOPRESSOR) 100 MG tablet Take one tablet two hours prior to cardiac CTA.   Multiple Vitamins-Minerals (CENTRUM SILVER 50+WOMEN PO) Take 1 tablet by mouth daily.   Omega-3 Fatty Acids (FISH OIL PO) Take 1 capsule by mouth daily.   omeprazole (PRILOSEC) 20 MG capsule Take 20 mg by mouth 2 (two) times daily before a meal.   polyethylene glycol (MIRALAX / GLYCOLAX) packet Take 17 g by mouth daily as needed for moderate constipation.   Probiotic Product (ALIGN PO) Take 1 capsule by mouth daily.   RHOPRESSA 0.02 % SOLN Place 1 drop into both eyes every evening.   vitamin E 180 MG (400 UNITS) capsule Take 400 Units by mouth daily.     Allergies:   Shellfish allergy, Atorvastatin, Atracurium & derivatives, Atropine, Iodine, and Statins   Social History   Socioeconomic  History   Marital status: Married    Spouse name: Not on file   Number of children: Not on file   Years of education: 12   Highest education level: Not on file  Occupational History   Occupation: retired  Tobacco Use   Smoking status: Never   Smokeless tobacco: Never  Vaping Use   Vaping Use: Never used  Substance and Sexual Activity   Alcohol use: Never   Drug use: Never   Sexual activity: Yes    Partners: Male  Other Topics Concern   Not on file  Social History Narrative   Not on file   Social Determinants of Health   Financial Resource Strain: Low Risk  (05/02/2022)   Overall Financial Resource Strain (CARDIA)    Difficulty of Paying Living Expenses: Not hard at all  Food Insecurity: No Food Insecurity (05/02/2022)   Hunger Vital Sign    Worried About Running Out of Food in the Last Year: Never true    [place] in the Last Year: Never true  Transportation Needs: No Transportation Needs (05/02/2022)   PRAPARE - Hydrologist (Medical): No    Lack of Transportation (Non-Medical): No  Physical Activity: Insufficiently Active (05/02/2022)    Exercise Vital Sign    Days of Exercise per Week: 3 days    Minutes of Exercise per Session: 30 min  Stress: No Stress Concern Present (05/02/2022)   [place]    Feeling of Stress : Not at all  Social Connections: Not on file     Family History: [name] family history includes Alcohol abuse in her father; Arthritis in her brother, sister, sister, and sister; Asthma in her sister; Colon polyps in her brother; Congenital heart disease in her brother; Diabetes in her brother; Glaucoma in her brother; Hyperlipidemia in her brother; Hypertension in her brother and sister; Pancreatic cancer in her mother; Prostate cancer in her brother. There is no history of Colon cancer, Esophageal cancer, Inflammatory bowel disease, Liver disease, Rectal cancer, or Stomach cancer.  ROS:   Please see the history of present illness.    All other systems reviewed and are negative.  EKGs/Labs/Other Studies Reviewed:    The following studies were reviewed today:   EKG:  EKG is  ordered today.  The ekg ordered today demonstrates  NSR, HR 93 bpm.   Recent Labs: 07/22/2022: BUN 21; Creatinine, Ser 0.71; Hemoglobin 12.9; Platelets 251; Potassium 4.0; Sodium 140  Recent Lipid Panel    Component Value Date/Time   CHOL 163 07/22/2022 1056   TRIG 94 07/22/2022 1056   HDL 63 07/22/2022 1056   CHOLHDL 2.6 07/22/2022 1056   CHOLHDL 5 05/27/2018 1045   VLDL 38.2 05/27/2018 1045   LDLCALC 83 07/22/2022 1056     Risk Assessment/Calculations:                Physical Exam:    VS:  BP 132/78   Pulse 93   Ht 5' 3"$  (1.6 m)   Wt 150 lb (68 kg)   LMP  (LMP Unknown)   BMI 26.57 kg/m     Wt Readings from Last 3 Encounters:  08/06/22 150 lb (68 kg)  07/22/22 150 lb (68 kg)  05/02/22 148 lb (67.1 kg)     GEN:  Well nourished, well developed in no acute distress HEENT: Normal NECK: No JVD; No carotid bruits LYMPHATICS: No  lymphadenopathy CARDIAC:  RRR, no murmurs, rubs, gallops RESPIRATORY:  Clear to auscultation without rales, wheezing or rhonchi  ABDOMEN: Soft, non-tender, non-distended MUSCULOSKELETAL:  No edema; No deformity  SKIN: Warm and dry NEUROLOGIC:  Alert and oriented x 3 PSYCHIATRIC:  Normal affect   ASSESSMENT:    1. Aortic atherosclerosis ([place])   2. Mixed hyperlipidemia   3. Primary hypertension   4. OSA (obstructive sleep apnea)   5. MVP (mitral valve prolapse)   6. Palpitations   7. Precordial pain    PLAN:    In order of problems listed above:  Aortic atherosclerosis -noted on CT of her chest in 2022, calcium score was 0 in 2022. Previously attempted cardiac CTA, however she was tachycardiac and imaging could not be completed. She did have a lexiscan in 2019 which was low risk.  She has been having some atypical chest pain as outlined in the HPI, sounds to be more musculoskeletal in nature however it does have mixed features.  Will arrange a coronary CTA.  Continue aspirin, Repatha. Hyperlipidemia -she is intolerant to statins, most recent LDL is 83 on 07/22/2022, continue Repatha Hypertension -blood pressure today 132/78, continue current antihypertensive regimen MVP -echo complete reviewed from 2019, EF 60 to 65%, trivial MR, will repeat echo as clinically indicated, no evidence of HF symptoms today Palpitations -she notices them occasionally when she is trying to rest at night, they are not overly bothersome for her, had been on nadolol in the past, then metoprolol.  Continue Cardizem.  OSA -compliant with CPAP  Disposition-coronary CTA, return in 6 to 12 months.          Medication Adjustments/Labs and Tests Ordered: Current medicines are reviewed at length with the patient today.  Concerns regarding medicines are outlined above.  Orders Placed This Encounter  Procedures   CT CORONARY MORPH W/CTA COR W/SCORE W/CA W/CM &/OR WO/CM   Meds ordered this encounter  Medications    metoprolol tartrate (LOPRESSOR) 100 MG tablet    Sig: Take one tablet two hours prior to cardiac CTA.    Dispense:  1 tablet    Refill:  0    Order Specific Question:   Supervising Provider    Answer:   Buford Dresser [id number]    Patient Instructions  Medication Instructions:  Your physician recommends that you continue on your current medications as directed. Please refer to the Current Medication list given to you today.  *If you need a refill on your cardiac medications before your next appointment, please call your pharmacy*   Lab Work: If your CT is scheduled after March 5th, please let us know!   Follow-Up: At [facility], you and your health needs are our priority.  As part of our continuing mission to provide you with exceptional heart care, we have created designated Provider Care Teams.  These Care Teams include your primary Cardiologist (physician) and Advanced Practice Providers (APPs -  Physician Assistants and Nurse Practitioners) who all work together to provide you with the care you need, when you need it.  We recommend signing up for the patient portal called "MyChart".  Sign up information is provided on this After Visit Summary.  MyChart is used to connect with patients for Virtual Visits (Telemedicine).  Patients are able to view lab/test results, encounter notes, upcoming appointments, etc.  Non-urgent messages can be sent to your provider as well.   To learn more about what you can do with MyChart, go to [email or web address].    Your next  appointment:   1 year(s)  Provider:   Skeet Latch, MD    Other Instructions   Your cardiac CT will be scheduled at one of the below locations:   [facility] [address] [place], [place] [address] [phone]  If scheduled at [facility], please arrive at the [facility] and Children's Entrance (Entrance C2) of [facility] 30 minutes prior to test start  time. You can use the FREE valet parking offered at entrance C (encouraged to control the heart rate for the test)  Proceed to the [facility] Radiology Department (first floor) to check-in and test prep.  All radiology patients and guests should use entrance C2 at [facility], accessed from [facility], even though the hospital's physical address listed is [address].    Please follow these instructions carefully (unless otherwise directed)  On the Night Before the Test: Be sure to Drink plenty of water. Do not consume any caffeinated/decaffeinated beverages or chocolate 12 hours prior to your test. Do not take any antihistamines 12 hours prior to your test.  On the Day of the Test: Drink plenty of water until 1 hour prior to the test. Do not eat any food 1 hour prior to test. You may take your regular medications prior to the test.  Take metoprolol (Lopressor) 173m  two hours prior to test. If you take Furosemide/Hydrochlorothiazide/Spironolactone, please HOLD on the morning of the test. FEMALES- please wear underwire-free bra if available, avoid dresses & tight clothing      After the Test: Drink plenty of water. After receiving IV contrast, you may experience a mild flushed feeling. This is normal. On occasion, you may experience a mild rash up to 24 hours after the test. This is not dangerous. If this occurs, you can take Benadryl 25 mg and increase your fluid intake. If you experience trouble breathing, this can be serious. If it is severe call 911 IMMEDIATELY. If it is mild, please call our office. If you take any of these medications: Glipizide/Metformin, Avandament, Glucavance, please do not take 48 hours after completing test unless otherwise instructed.  We will call to schedule your test 2-4 weeks out understanding that some insurance companies will need an authorization prior to the service being performed.   For non-scheduling related  questions, please contact the cardiac imaging nurse navigator should you have any questions/concerns: SMarchia Bond Cardiac Imaging Nurse Navigator MGordy Clement Cardiac Imaging Nurse Navigator [facility] Direct Office Dial: [phone]  For scheduling needs, including cancellations and rescheduling, please call [place] [phone]    Signed, JTrudi Ida NP  08/06/2022 11:21 AM    [place]

## 2022-08-06 ENCOUNTER — Encounter (HOSPITAL_BASED_OUTPATIENT_CLINIC_OR_DEPARTMENT_OTHER): Payer: Self-pay | Admitting: Cardiology

## 2022-08-06 ENCOUNTER — Ambulatory Visit (HOSPITAL_BASED_OUTPATIENT_CLINIC_OR_DEPARTMENT_OTHER): Payer: Medicare Other | Admitting: Cardiology

## 2022-08-06 VITALS — BP 132/78 | HR 93 | Ht 63.0 in | Wt 150.0 lb

## 2022-08-06 DIAGNOSIS — R072 Precordial pain: Secondary | ICD-10-CM | POA: Diagnosis not present

## 2022-08-06 DIAGNOSIS — I700 Atherosclerosis of aorta: Secondary | ICD-10-CM

## 2022-08-06 DIAGNOSIS — G4733 Obstructive sleep apnea (adult) (pediatric): Secondary | ICD-10-CM

## 2022-08-06 DIAGNOSIS — I341 Nonrheumatic mitral (valve) prolapse: Secondary | ICD-10-CM | POA: Diagnosis not present

## 2022-08-06 DIAGNOSIS — I10 Essential (primary) hypertension: Secondary | ICD-10-CM | POA: Diagnosis not present

## 2022-08-06 DIAGNOSIS — E782 Mixed hyperlipidemia: Secondary | ICD-10-CM | POA: Diagnosis not present

## 2022-08-06 DIAGNOSIS — R002 Palpitations: Secondary | ICD-10-CM

## 2022-08-06 MED ORDER — METOPROLOL TARTRATE 100 MG PO TABS: ORAL_TABLET | ORAL | 0 refills | Status: DC

## 2022-08-06 NOTE — Patient Instructions (Addendum)
Medication Instructions:  Your physician recommends that you continue on your current medications as directed. Please refer to the Current Medication list given to you today.  *If you need a refill on your cardiac medications before your next appointment, please call your pharmacy*   Lab Work: If your CT is scheduled after March 5th, please let us know!   Follow-Up: At [facility], you and your health needs are our priority.  As part of our continuing mission to provide you with exceptional heart care, we have created designated Provider Care Teams.  These Care Teams include your primary Cardiologist (physician) and Advanced Practice Providers (APPs -  Physician Assistants and Nurse Practitioners) who all work together to provide you with the care you need, when you need it.  We recommend signing up for the patient portal called "MyChart".  Sign up information is provided on this After Visit Summary.  MyChart is used to connect with patients for Virtual Visits (Telemedicine).  Patients are able to view lab/test results, encounter notes, upcoming appointments, etc.  Non-urgent messages can be sent to your provider as well.   To learn more about what you can do with MyChart, go to [email or web address].    Your next appointment:   1 year(s)  Provider:   Skeet Latch, MD    Other Instructions   Your cardiac CT will be scheduled at one of the below locations:   [facility] [address] [place],  [address] [phone]  If scheduled at [facility], please arrive at the [facility] and Children's Entrance (Entrance C2) of [facility] 30 minutes prior to test start time. You can use the FREE valet parking offered at entrance C (encouraged to control the heart rate for the test)  Proceed to the [facility] Radiology Department (first floor) to check-in and test prep.  All radiology patients and guests should use entrance C2 at [facility], accessed from [facility], even though the hospital's physical address listed is [address].    Please follow these instructions carefully (unless otherwise directed)  On the Night Before the Test: Be sure to Drink plenty of water. Do not consume any caffeinated/decaffeinated beverages or chocolate 12 hours prior to your test. Do not take any antihistamines 12 hours prior to your test.  On the Day of the Test: Drink plenty of water until 1 hour prior to the test. Do not eat any food 1 hour prior to test. You may take your regular medications prior to the test.  Take metoprolol (Lopressor) 173m  two hours prior to test. If you take Furosemide/Hydrochlorothiazide/Spironolactone, please HOLD on the morning of the test. FEMALES- please wear underwire-free bra if available, avoid dresses & tight clothing      After the Test: Drink plenty of water. After receiving IV contrast, you may experience a mild flushed feeling. This is normal. On occasion, you may experience a mild rash up to 24 hours after the test. This is not dangerous. If this occurs, you can take Benadryl 25 mg and increase your fluid intake. If you experience trouble breathing, this can be serious. If it is severe call 911 IMMEDIATELY. If it is mild, please call our office. If you take any of these medications: Glipizide/Metformin, Avandament, Glucavance, please do not take 48 hours after completing test unless otherwise instructed.  We will call to schedule your test 2-4 weeks out understanding that some insurance companies will need an authorization prior  to the service being performed.   For non-scheduling related questions, please contact the cardiac imaging nurse navigator should you have any questions/concerns: Marchia Bond, Cardiac Imaging Nurse Navigator Gordy Clement, Cardiac Imaging Nurse Navigator [place] [facility] Direct Office Dial: [phone]   For  scheduling needs, including cancellations and rescheduling, please call [place], [phone].

## 2022-08-09 ENCOUNTER — Encounter: Payer: Self-pay | Admitting: Pulmonary Disease

## 2022-08-09 ENCOUNTER — Ambulatory Visit: Payer: Medicare Other | Admitting: Pulmonary Disease

## 2022-08-09 VITALS — BP 112/60 | HR 88 | Temp 98.3°F | Ht 63.0 in | Wt 150.0 lb

## 2022-08-09 DIAGNOSIS — J849 Interstitial pulmonary disease, unspecified: Secondary | ICD-10-CM

## 2022-08-09 NOTE — Patient Instructions (Signed)
Will get a CT chest high-resolution in place for evaluation of the lung as he is having increased cough and dyspnea Return to clinic in 1 to 2 months.

## 2022-08-09 NOTE — Progress Notes (Addendum)
Terri Barajas    [id number]    1945/10/25  Primary Care Physician:Moore, Doreene Burke, [place]  Referring Physician: [name], [address],  [place] [address]  Chief complaint: Follow-up for chronic cough, post COVID-19  HPI: 77 y.o.  with history of hypertension, sleep apnea [follows with Dr. Dohmeir] Referred for evaluation of chronic cough She was diagnosed with COVID-19 in January 2021.  Did not require hospitalization and was treated with monoclonal antibody Has developed chronic nonproductive cough since then.  Not associated with dyspnea, wheezing, sputum production. Complains of irritation at the back of the throat and feeling of mucus stuck..  Show significant acid reflux and is treated with Dexilant by Dr. Rush Landmark.  Also has sleep apnea for which she is on CPAP by Dr. Anastasia Pall.  Pets: No pets Occupation: Used to work in Therapist, art at [employer], clinic. Exposures: No known exposures.  No mold, hot tub, Jacuzzi.  No feather pillows or comforters Smoking history: Never smoker Travel history: Previously lived in [place] Relevant family history: No significant family issue of lung disease  Interim history: Here for follow-up after a gap of 2 years.  States that overall she is doing well but has noticed some cough and increased dyspnea over the past year.    Outpatient Encounter Medications as of 08/09/2022  Medication Sig   acetaminophen (TYLENOL) 500 MG tablet Take 500-1,000 mg by mouth every 6 (six) hours as needed for moderate pain.   ALPRAZolam (XANAX) 0.25 MG tablet Take 1 tablet (0.25 mg total) by mouth daily as needed for anxiety.   ascorbic acid (VITAMIN C) 500 MG tablet Take 500 mg by mouth daily.   aspirin 81 MG chewable tablet Chew 81 mg by mouth daily.   Biotin 5 MG CAPS Take 5 mg by mouth daily.   calcium carbonate (OSCAL) 1500 (600 Ca) MG TABS tablet Take 600 mg of elemental calcium by mouth daily.    cholecalciferol (VITAMIN D3) 25 MCG (1000 UNIT) tablet Take 3,000 Units by mouth daily.   diltiazem (CARDIZEM) 30 MG tablet TAKE 1 TABLET BY MOUTH  TWICE DAILY   dorzolamide-timolol (COSOPT) 22.3-6.8 MG/ML ophthalmic solution Apply 1 drop to eye in the morning and at bedtime.   Evolocumab (REPATHA SURECLICK) [id number] MG/ML SOAJ Inject 140 mg into the skin every 14 (fourteen) days.   fluticasone (FLONASE) 50 MCG/ACT nasal spray Place 1 spray into both nostrils daily.   hydrochlorothiazide (HYDRODIURIL) 25 MG tablet Take 1 tablet (25 mg total) by mouth daily.   ibuprofen (ADVIL) 200 MG tablet Take 200-400 mg by mouth every 6 (six) hours as needed for moderate pain.   latanoprost (XALATAN) 0.005 % ophthalmic solution Place 1 drop into both eyes at bedtime.    loratadine (CLARITIN REDITABS) 10 MG dissolvable tablet Take 10 mg by mouth daily as needed for allergies.   metoprolol tartrate (LOPRESSOR) 100 MG tablet Take one tablet two hours prior to cardiac CTA.   Multiple Vitamins-Minerals (CENTRUM SILVER 50+WOMEN PO) Take 1 tablet by mouth daily.   omeprazole (PRILOSEC) 20 MG capsule Take 20 mg by mouth 2 (two) times daily before a meal.   polyethylene glycol (MIRALAX / GLYCOLAX) packet Take 17 g by mouth daily as needed for moderate constipation.   Probiotic Product (ALIGN PO) Take 1 capsule by mouth daily.   RHOPRESSA 0.02 % SOLN Place 1 drop into both eyes every evening.   vitamin E 180 MG (400 UNITS)  capsule Take 400 Units by mouth daily.   [DISCONTINUED] Omega-3 Fatty Acids (FISH OIL PO) Take 1 capsule by mouth daily.   No facility-administered encounter medications on file as of 08/09/2022.    Physical Exam: Blood pressure 112/60, pulse 88, temperature 98.3 F (36.8 C), temperature source Oral, height '5\' 3"'$  (1.6 m), weight 150 lb (68 kg), SpO2 99 %. Gen:      No acute distress HEENT:  EOMI, sclera anicteric Neck:     No masses; no thyromegaly Lungs:    Clear to auscultation bilaterally; normal  respiratory effort CV:         Regular rate and rhythm; no murmurs Abd:      + bowel sounds; soft, non-tender; no palpable masses, no distension Ext:    No edema; adequate peripheral perfusion Skin:      Warm and dry; no rash Neuro: alert and oriented x 3 Psych: normal mood and affect   Data Reviewed: Imaging: CT abdomen pelvis 01/22/2018-visualized lung bases are normal Chest x-ray 07/04/2019-basal interstitial prominence High-res CT chest 03/07/2021-mild subpleural and basilar reticulation stable compared to September 2021 I have reviewed images personally.  PFTs: 04/16/2021 FVC 1.95 [90%], FEV1 1.65 [99%], F/F85, TLC 4.05 [82%], DLCO 15.51 [83%] Normal test  Labs: CBC 08/03/2019-WBC 6.7, eos 2.2%, absolute eosinophil count 214  CBC 02/15/2020-WBC 5.7, eos 3.3%, absolute eosinophil count 188 IgE 02/15/2020-20  Assessment:  Chronic cough, post COVID-19 CT reviewed with mild reticular changes at the base which could be consistent with post COVID-19 ILD This has remained stable over the past year with normal PFTs which is reassuring. Given slightly worsening cough and dyspnea will reevaluate with high-res CT and PFTs  Suspect her ongoing symptoms are from upper airway cough, GERD Continue Dexilant Flonase, antihistamine  OSA On CPAP.  Follows with neurology. She had some questions about her CPAP but I have directed her follow-up with Dr. Brett Fairy  Plan/Recommendations: High-res CT, PFTs   Marshell Garfinkel MD [facility] 08/09/2022, 1:41 PM  CC: [name], FNP

## 2022-08-13 ENCOUNTER — Telehealth (HOSPITAL_COMMUNITY): Payer: Self-pay | Admitting: Emergency Medicine

## 2022-08-13 DIAGNOSIS — Z888 Allergy status to other drugs, medicaments and biological substances status: Secondary | ICD-10-CM

## 2022-08-13 MED ORDER — DIPHENHYDRAMINE HCL 50 MG PO TABS: ORAL_TABLET | ORAL | 0 refills | Status: DC

## 2022-08-13 MED ORDER — PREDNISONE 50 MG PO TABS: ORAL_TABLET | ORAL | 0 refills | Status: DC

## 2022-08-13 NOTE — Telephone Encounter (Signed)
Reaching out to patient to offer assistance regarding upcoming cardiac imaging study; pt verbalizes understanding of appt date/time, parking situation and where to check in, pre-test NPO status and medications ordered, and verified current allergies; name and call back number provided for further questions should they arise Marchia Bond RN Navigator Cardiac Imaging [facility] [phone] office [phone] cell  Arrival 730 WC entrance '100mg'$  metoprolol tartrate + 13 hr prep (pt states she's allergic to shellfish and iodine, never received contrast before so erring on the side of caution) [place] iv issues Aware contrast/nitro

## 2022-08-14 ENCOUNTER — Ambulatory Visit (HOSPITAL_COMMUNITY)
Admission: RE | Admit: 2022-08-14 | Discharge: 2022-08-14 | Disposition: A | Discharge status: Home / Self Care | Payer: Medicare Other | Source: Ambulatory Visit | Attending: Cardiology | Admitting: Cardiology

## 2022-08-14 ENCOUNTER — Encounter (HOSPITAL_COMMUNITY): Payer: Self-pay

## 2022-08-14 DIAGNOSIS — R072 Precordial pain: Secondary | ICD-10-CM

## 2022-08-14 DIAGNOSIS — R002 Palpitations: Secondary | ICD-10-CM | POA: Diagnosis not present

## 2022-08-14 DIAGNOSIS — I700 Atherosclerosis of aorta: Secondary | ICD-10-CM | POA: Insufficient documentation

## 2022-08-14 DIAGNOSIS — I10 Essential (primary) hypertension: Secondary | ICD-10-CM | POA: Diagnosis not present

## 2022-08-14 DIAGNOSIS — G4733 Obstructive sleep apnea (adult) (pediatric): Secondary | ICD-10-CM

## 2022-08-14 DIAGNOSIS — I341 Nonrheumatic mitral (valve) prolapse: Secondary | ICD-10-CM

## 2022-08-14 DIAGNOSIS — E782 Mixed hyperlipidemia: Secondary | ICD-10-CM

## 2022-08-14 MED ORDER — DILTIAZEM HCL 25 MG/5ML IV SOLN: 10.0000 mg | Freq: Once | INTRAVENOUS | Status: AC

## 2022-08-14 MED ORDER — DILTIAZEM HCL 25 MG/5ML IV SOLN
10.0000 mg | Freq: Once | INTRAVENOUS | Status: AC
  Administered 2022-08-14: 10 mg via INTRAVENOUS

## 2022-08-14 MED ORDER — DILTIAZEM HCL 25 MG/5ML IV SOLN
INTRAVENOUS | Status: AC
  Administered 2022-08-14: 10 mg via INTRAVENOUS
  Filled 2022-08-14: qty 5

## 2022-08-14 MED ORDER — METOPROLOL TARTRATE 5 MG/5ML IV SOLN
INTRAVENOUS | Status: AC
  Filled 2022-08-14: qty 5

## 2022-08-14 MED ORDER — METOPROLOL TARTRATE 5 MG/5ML IV SOLN
10.0000 mg | Freq: Once | INTRAVENOUS | Status: AC
  Administered 2022-08-14: 10 mg via INTRAVENOUS

## 2022-08-14 MED ORDER — METOPROLOL TARTRATE 5 MG/5ML IV SOLN
INTRAVENOUS | Status: AC
  Filled 2022-08-14: qty 10

## 2022-08-14 NOTE — Progress Notes (Signed)
Patient scheduled for CTA heart study. Patient HR 80's upon arrival. Patient was given a total of cardizem '20mg'$  IV and metoprolol '20mg'$  IV, but HR did not come down to ideal range of 60's for an optimal study. Contacted Dr. Harriet Masson and was instructed for patient to be cancelled if HR did not drop down to 60's. Patient's CTA heart study was cancelled, and patient was informed by this RN of rationale for cancellation. Patient verbalized understanding. BP 123/66 upon discharge. PIV removed per protocol. Patient wheeled to [facility] entrance for discharge with spouse. This RN also spoke with spouse about rationale for cancellation prior to patient discharge. Spouse also verbalized understanding. CT staff made aware.

## 2022-08-16 ENCOUNTER — Encounter (HOSPITAL_COMMUNITY): Payer: Self-pay

## 2022-08-16 ENCOUNTER — Emergency Department (HOSPITAL_COMMUNITY)
Admission: EM | Admit: 2022-08-16 | Discharge: 2022-08-16 | Disposition: A | Discharge status: Home / Self Care | Payer: Medicare Other | Attending: Student | Admitting: Student

## 2022-08-16 ENCOUNTER — Emergency Department (HOSPITAL_COMMUNITY): Payer: Medicare Other

## 2022-08-16 ENCOUNTER — Other Ambulatory Visit: Payer: Self-pay

## 2022-08-16 DIAGNOSIS — H401132 Primary open-angle glaucoma, bilateral, moderate stage: Secondary | ICD-10-CM | POA: Diagnosis not present

## 2022-08-16 DIAGNOSIS — Z79899 Other long term (current) drug therapy: Secondary | ICD-10-CM | POA: Insufficient documentation

## 2022-08-16 DIAGNOSIS — E876 Hypokalemia: Secondary | ICD-10-CM | POA: Diagnosis not present

## 2022-08-16 DIAGNOSIS — I10 Essential (primary) hypertension: Secondary | ICD-10-CM | POA: Insufficient documentation

## 2022-08-16 DIAGNOSIS — R0789 Other chest pain: Secondary | ICD-10-CM | POA: Insufficient documentation

## 2022-08-16 DIAGNOSIS — H04123 Dry eye syndrome of bilateral lacrimal glands: Secondary | ICD-10-CM | POA: Diagnosis not present

## 2022-08-16 DIAGNOSIS — Z7982 Long term (current) use of aspirin: Secondary | ICD-10-CM | POA: Diagnosis not present

## 2022-08-16 DIAGNOSIS — Z961 Presence of intraocular lens: Secondary | ICD-10-CM | POA: Diagnosis not present

## 2022-08-16 DIAGNOSIS — R079 Chest pain, unspecified: Secondary | ICD-10-CM | POA: Diagnosis not present

## 2022-08-16 LAB — BASIC METABOLIC PANEL
Anion gap: 10 (ref 5–15)
BUN: 15 mg/dL (ref 8–23)
CO2: 29 mmol/L (ref 22–32)
Calcium: 9.6 mg/dL (ref 8.9–10.3)
Chloride: 95 mmol/L — ABNORMAL LOW (ref 98–111)
Creatinine, Ser: 0.8 mg/dL (ref 0.44–1.00)
GFR, Estimated: >60 mL/min (ref >60)
Glucose, Bld: 99 mg/dL (ref 70–99)
Potassium: 3.3 mmol/L — ABNORMAL LOW (ref 3.5–5.1)
Sodium: 134 mmol/L — ABNORMAL LOW (ref 135–145)

## 2022-08-16 LAB — CBC
HCT: 38.6 % (ref 36.0–46.0)
Hemoglobin: 12.5 g/dL (ref 12.0–15.0)
MCH: 29.3 pg (ref 26.0–34.0)
MCHC: 32.4 g/dL (ref 30.0–36.0)
MCV: 90.6 fL (ref 80.0–100.0)
Platelets: 260 10*3/uL (ref 150–400)
RBC: 4.26 MIL/uL (ref 3.87–5.11)
RDW: 12.6 % (ref 11.5–15.5)
WBC: 10 10*3/uL (ref 4.0–10.5)
nRBC: 0 % (ref 0.0–0.2)

## 2022-08-16 LAB — TROPONIN I (HIGH SENSITIVITY)
Troponin I (High Sensitivity): 4 ng/L (ref <18)
Troponin I (High Sensitivity): 4 ng/L (ref <18)

## 2022-08-16 MED ORDER — ALUM & MAG HYDROXIDE-SIMETH 200-200-20 MG/5ML PO SUSP
30.0000 mL | Freq: Once | ORAL | Status: AC
  Administered 2022-08-16: 30 mL via ORAL
  Filled 2022-08-16: qty 30

## 2022-08-16 MED ORDER — LIDOCAINE VISCOUS HCL 2 % MT SOLN
15.0000 mL | Freq: Once | OROMUCOSAL | Status: AC
  Administered 2022-08-16: 15 mL via ORAL
  Filled 2022-08-16: qty 15

## 2022-08-16 NOTE — ED Provider Triage Note (Signed)
Emergency Medicine Provider Triage Evaluation Note  Terri Barajas , a 77 y.o. female  was evaluated in triage.  Patient complaining of chest pain.  Reports that it came on as well like pressure and discomfort and also involved her left arm.  Lasted about 30 minutes and went away.  She has been having intermittent episodes such as this over the past weeks.   Review of Systems  Positive:  Negative:   Physical Exam  BP 134/77 (BP Location: Right Arm)   Pulse 83   Temp 98.4 F (36.9 C) (Oral)   Resp 18   Ht '5\' 3"'$  (1.6 m)   Wt 68 kg   LMP  (LMP Unknown)   SpO2 97%   BMI 26.57 kg/m  Gen:   Awake, no distress   Resp:  Normal effort  MSK:   Moves extremities without difficulty  Other:  RRR, ctab  Medical Decision Making  Medically screening exam initiated at 7:09 PM.  Appropriate orders placed.  Nyla Stepanek Tartaglia was informed that the remainder of the evaluation will be completed by another provider, this initial triage assessment does not replace that evaluation, and the importance of remaining in the ED until their evaluation is complete.     Rhae Hammock, [place] 08/16/22 1909

## 2022-08-16 NOTE — ED Triage Notes (Signed)
Pt arrived POV w/ c/o CP onset intermittent x couple of months, pt reports she came in today d/t it isn't getting better. When having CP it is on the L side radiating into her arm, nauseated and lightheaded, described as tightness and shob. Pt denies any CP at this time. Only cardiac hx is mitral valve prolapse per pt.

## 2022-08-17 NOTE — ED Provider Notes (Signed)
[facility] Provider Note  CSN: [id number] Arrival date & time: 08/16/22 [id number]  Chief Complaint(s) Chest Pain  HPI Terri Barajas is a 77 y.o. female with PMH panic attacks, GERD, mitral valve prolapse, glaucoma, arthritis who presents emergency department for evaluation of chest pain.  Patient states that over the last 1 months she has been having intermittent episodes of chest tightness that radiates to her arm, with associated hot flashes and a feeling like the world is closing in around her.  She states that these symptoms can last between 5 and 30 minutes and do not have any specific trigger.  Denies any specifically postprandial or exertional symptoms.  There is no associated diaphoresis, nausea, vomiting or other associated symptoms.  Currently in the emergency department she states that her symptoms have resolved and she did have a 5-minute episode earlier today.  She denies taking any medications prior to arrival.  Currently she denies chest pain, shortness of breath, Donnell pain, nausea, vomiting or other systemic symptoms.   Past Medical History Past Medical History:  Diagnosis Date   Anxiety    Arthritis    Complication of anesthesia    some type of sedation medication made her feel crazy   Dysrhythmia    Family history of adverse reaction to anesthesia    daughter has Malignant Hyperthermia hx -    GERD (gastroesophageal reflux disease)    Glaucoma    H/O: hysterectomy 1978   Heart disease    History of hiatal hernia    Hypertension    MVP (mitral valve prolapse)    Panic attacks    Pre-diabetes    Sciatica    Patient Active Problem List   Diagnosis Date Noted   Prediabetes 07/29/2022   Leg fatigue 07/22/2022   Statin myopathy 07/22/2022   History of arthroscopy of knee 05/14/2022   Globus sensation 01/31/2022   LPRD (laryngopharyngeal reflux disease) 01/31/2022   Tear of medial meniscus of knee 01/08/2022   Colon  cancer screening 01/04/2022   Abdominal distention 01/04/2022   Interstitial pulmonary disease ([place]) 12/19/2021   Osteoarthritis of left hip 10/22/2021   Abdominal bloating 09/03/2021   Chronic intermittent hypoxia with obstructive sleep apnea 09/03/2021   Myopathies 03/28/2021   Osteoarthritis of left knee 10/10/2020   Pes anserinus bursitis of left knee 10/10/2020   Trochanteric bursitis of left hip 10/10/2020   Body mass index (BMI) 26.0-26.9, adult 07/06/2020   Disc displacement, lumbar 07/06/2020   OSA on CPAP 01/06/2019   Abnormal CT of the abdomen 10/04/2018   Radiculopathy affecting upper extremity, left 07/25/2018   Hyperlipidemia 05/13/2018   Situational anxiety 04/29/2018   MVP (mitral valve prolapse) 04/29/2018   Sleep-disordered breathing 04/29/2018   Family History of Hyperthermia, malignant 04/23/2018   Barrett's esophagus 03/01/2018   Elevated lipase 02/18/2018   Hiatal hernia 02/18/2018   Constipation 02/18/2018   Bloating 02/18/2018   Aortic atherosclerosis ([place]) 01/23/2018   Gastroesophageal reflux disease without esophagitis    Glaucoma    Essential hypertension    Home Medication(s) Prior to Admission medications   Medication Sig Start Date End Date Taking? Authorizing Provider  acetaminophen (TYLENOL) 500 MG tablet Take 500-1,000 mg by mouth every 6 (six) hours as needed for moderate pain.    [provider]  ALPRAZolam Duanne Moron) 0.25 MG tablet Take 1 tablet (0.25 mg total) by mouth daily as needed for anxiety. 01/15/22   [name], FNP  ascorbic acid (VITAMIN C)  500 MG tablet Take 500 mg by mouth daily.    [provider]  aspirin 81 MG chewable tablet Chew 81 mg by mouth daily.    [provider]  Biotin 5 MG CAPS Take 5 mg by mouth daily.    [provider]  calcium carbonate (OSCAL) 1500 (600 Ca) MG TABS tablet Take 600 mg of elemental calcium by mouth daily.    [provider]  cholecalciferol (VITAMIN D3)  25 MCG (1000 UNIT) tablet Take 3,000 Units by mouth daily.    [provider]  diltiazem (CARDIZEM) 30 MG tablet TAKE 1 TABLET BY MOUTH  TWICE DAILY 07/23/21   Skeet Latch, MD  diphenhydrAMINE (BENADRYL) 50 MG tablet Take 1 tablet ('50mg'$ ) 1 hr prior to CT appt 08/13/22   Trudi Ida, NP  dorzolamide-timolol (COSOPT) 22.3-6.8 MG/ML ophthalmic solution Apply 1 drop to eye in the morning and at bedtime.    [provider]  Evolocumab (REPATHA SURECLICK) [id number] MG/ML SOAJ Inject 140 mg into the skin every 14 (fourteen) days. 02/21/22   Skeet Latch, MD  fluticasone [facility]) 50 MCG/ACT nasal spray Place 1 spray into both nostrils daily. 03/07/22   Raspet, Derry Skill, PA-C  hydrochlorothiazide (HYDRODIURIL) 25 MG tablet Take 1 tablet (25 mg total) by mouth daily. 12/19/21   [name], FNP  ibuprofen (ADVIL) 200 MG tablet Take 200-400 mg by mouth every 6 (six) hours as needed for moderate pain.    [provider]  latanoprost (XALATAN) 0.005 % ophthalmic solution Place 1 drop into both eyes at bedtime.     [provider]  loratadine (CLARITIN REDITABS) 10 MG dissolvable tablet Take 10 mg by mouth daily as needed for allergies.    [provider]  metoprolol tartrate (LOPRESSOR) 100 MG tablet Take one tablet two hours prior to cardiac CTA. 08/06/22   Trudi Ida, NP  Multiple Vitamins-Minerals (CENTRUM SILVER 50+WOMEN PO) Take 1 tablet by mouth daily.    [provider]  omeprazole (PRILOSEC) 20 MG capsule Take 20 mg by mouth 2 (two) times daily before a meal.    [provider]  polyethylene glycol (MIRALAX / GLYCOLAX) packet Take 17 g by mouth daily as needed for moderate constipation.    [provider]  predniSONE (DELTASONE) 50 MG tablet Take 1 tablet ('50mg'$ ) 13 hr prior to CT scan, take 1 tablet ('50mg'$ ) 7 hr prior to CT scan, take 1 tablet ('50mg'$ ) 1 hr prior to CT scab 08/13/22   Trudi Ida, NP  Probiotic Product  (ALIGN PO) Take 1 capsule by mouth daily.    [provider]  RHOPRESSA 0.02 % SOLN Place 1 drop into both eyes every evening. 05/25/20   [provider]  vitamin E 180 MG (400 UNITS) capsule Take 400 Units by mouth daily.    [provider]  Past Surgical History Past Surgical History:  Procedure Laterality Date   APPENDECTOMY  1972   BACK SURGERY  07/14/2020   chipped disc   BIOPSY  07/06/2018   Procedure: BIOPSY;  Surgeon: Rush Landmark Telford Nab., MD;  Location: [facility];  Service: Gastroenterology;;   BIOPSY  03/04/2022   Procedure: BIOPSY;  Surgeon: Irving Copas., MD;  Location: [facility];  Service: Gastroenterology;;   CATARACT EXTRACTION  2013   [place] PROPOFOL N/A 03/04/2022   Procedure: COLONOSCOPY WITH PROPOFOL;  Surgeon: Irving Copas., MD;  Location: [facility];  Service: Gastroenterology;  Laterality: N/A;   ESOPHAGOGASTRODUODENOSCOPY (EGD) WITH PROPOFOL N/A 07/06/2018   Procedure: ESOPHAGOGASTRODUODENOSCOPY (EGD) WITH PROPOFOL;  Surgeon: Rush Landmark Telford Nab., MD;  Location: [facility];  Service: Gastroenterology;  Laterality: N/A;   ESOPHAGOGASTRODUODENOSCOPY (EGD) WITH PROPOFOL N/A 03/04/2022   Procedure: ESOPHAGOGASTRODUODENOSCOPY (EGD) WITH PROPOFOL;  Surgeon: Rush Landmark Telford Nab., MD;  Location: [facility];  Service: Gastroenterology;  Laterality: N/A;   HAND SURGERY Bilateral    HEMORRHOID SURGERY  2018   KNEE CARTILAGE SURGERY Left 01/28/2022   POLYPECTOMY  03/04/2022   Procedure: POLYPECTOMY;  Surgeon: Mansouraty, Telford Nab., MD;  Location: [facility];  Service: Gastroenterology;;   S/P Hysterectomy   1987   SAVORY DILATION N/A 03/04/2022   Procedure: Azzie [place] DILATION;  Surgeon: Irving Copas., MD;   Location: [facility];  Service: Gastroenterology;  Laterality: N/A;   Family History Family History  Problem Relation Age of Onset   Pancreatic cancer Mother    Alcohol abuse Father    Asthma Sister    Hypertension Sister    Arthritis Sister    Diabetes Brother    Arthritis Brother    Hyperlipidemia Brother    Hypertension Brother    Glaucoma Brother    Colon polyps Brother    Arthritis Sister    Arthritis Sister    Prostate cancer Brother    Congenital heart disease Brother    Colon cancer Neg Hx    Esophageal cancer Neg Hx    Inflammatory bowel disease Neg Hx    Liver disease Neg Hx    Rectal cancer Neg Hx    Stomach cancer Neg Hx     Social History Social History   Tobacco Use   Smoking status: Never    Passive exposure: Never   Smokeless tobacco: Never  Vaping Use   Vaping Use: Never used  Substance Use Topics   Alcohol use: Never   Drug use: Never   Allergies Shellfish allergy, Atorvastatin, Atracurium & derivatives, Atropine, Iodine, and Statins  Review of Systems Review of Systems  Respiratory:  Positive for chest tightness.   Cardiovascular:  Positive for chest pain.    Physical Exam Vital Signs  I have reviewed the triage vital signs BP (!) 150/78   Pulse 80   Temp 98.4 F (36.9 C) (Oral)   Resp 17   Ht '5\' 3"'$  (1.6 m)   Wt 68 kg   LMP  (LMP Unknown)   SpO2 99%   BMI 26.57 kg/m   Physical Exam Vitals and nursing note reviewed.  Constitutional:      General: She is not in acute distress.    Appearance: She is well-developed.  HENT:     Head: Normocephalic and atraumatic.  Eyes:     Conjunctiva/sclera: Conjunctivae normal.  Cardiovascular:     Rate and Rhythm: Normal rate and regular rhythm.  Heart sounds: No murmur heard. Pulmonary:     Effort: Pulmonary effort is normal. No respiratory distress.     Breath sounds: Normal breath sounds.  Abdominal:     Palpations: Abdomen is soft.     Tenderness: There is no abdominal  tenderness.  Musculoskeletal:        General: No swelling.     Cervical back: Neck supple.  Skin:    General: Skin is warm and dry.     Capillary Refill: Capillary refill takes less than 2 seconds.  Neurological:     Mental Status: She is alert.  Psychiatric:        Mood and Affect: Mood normal.     ED Results and Treatments Labs (all labs ordered are listed, but only abnormal results are displayed) Labs Reviewed  BASIC METABOLIC PANEL - Abnormal; Notable for the following components:      Result Value   Sodium 134 (*)    Potassium 3.3 (*)    Chloride 95 (*)    All other components within normal limits  CBC  TROPONIN I (HIGH SENSITIVITY)  TROPONIN I (HIGH SENSITIVITY)                                                                                                                          Radiology DG Chest 2 View  Result Date: 08/16/2022 CLINICAL DATA:  Chest pain EXAM: CHEST - 2 VIEW COMPARISON:  Chest x-ray 02/15/2020 FINDINGS: The heart size and mediastinal contours are within normal limits. Both lungs are clear. The visualized skeletal structures are unremarkable. IMPRESSION: No active cardiopulmonary disease. Electronically Signed   By: Ronney Asters M.D.   On: 08/16/2022 19:17    Pertinent labs & imaging results that were available during my care of the patient were reviewed by me and considered in my medical decision making (see MDM for details).  Medications Ordered in ED Medications  alum & mag hydroxide-simeth (MAALOX/MYLANTA) 200-200-20 MG/5ML suspension 30 mL (30 mLs Oral Given 08/16/22 2221)    And  lidocaine (XYLOCAINE) 2 % viscous mouth solution 15 mL (15 mLs Oral Given 08/16/22 2221)                                                                                                                                     Procedures Procedures  (including critical care time)  Medical Decision Making / ED  Course   This patient presents to the ED for concern of chest  pain, this involves an extensive number of treatment options, and is a complaint that carries with it a high risk of complications and morbidity.  The differential diagnosis includes ACS, Aortic Dissection, Pneumothorax, Pneumonia, Esophageal Rupture, PE, pericarditis, esophageal spasm, dysrhythmia, GERD, costochondritis.,  Panic attack  MDM: Patient seen emerged part for evaluation of chest pain.  Physical exam is unremarkable.  Laboratory evaluation with some mild hypokalemia to 3.3 but is otherwise unremarkable.  High-sensitivity troponin is negative and delta troponin is negative.  ECG without evidence of dysrhythmia or ischemia.  Chest x-ray unremarkable.  On my reevaluation, patient was having some mild return of symptoms and thus we trialed a GI cocktail and patient did report some mild improvement but states that she really was not having significant chest pain when she drank the medication so the true effect of this medication is unknown.  Patient is low risk by Wells criteria and have overall low suspicion for PE.  Patient's heart score is 4 and thus we had a shared decision-making discussion about hospital admission versus outpatient cardiology follow-up and given the patient is completely asymptomatic today here in the emergency department and the symptoms been going on for over a month, and this event happened earlier today so we would expect to see at least a mild troponin elevation, we agreed that the patient will follow-up outpatient with her cardiologist to have a coronary CT done next week which is already scheduled.  Patient has Xanax at home and will trial a small dose of Xanax when this episode occurs again to monitor for improvement to see if panic disorder is at play here.  Equally as likely would be something like esophageal spasm and she will follow-up outpatient with her gastroenterologist.  She was given strict return precautions which she voiced understanding she was discharged with  outpatient cardiology and gastroenterology follow-up.   Additional history obtained: -Additional history obtained from husband -External records from outside source obtained and reviewed including: Chart review including previous notes, labs, imaging, consultation notes   Lab Tests: -I ordered, reviewed, and interpreted labs.   The pertinent results include:   Labs Reviewed  BASIC METABOLIC PANEL - Abnormal; Notable for the following components:      Result Value   Sodium 134 (*)    Potassium 3.3 (*)    Chloride 95 (*)    All other components within normal limits  CBC  TROPONIN I (HIGH SENSITIVITY)  TROPONIN I (HIGH SENSITIVITY)      EKG   EKG Interpretation  Date/Time:  Friday August 16 2022 18:24:14 EST Ventricular Rate:  86 PR Interval:  156 QRS Duration: 86 QT Interval:  354 QTC Calculation: 423 R Axis:   -30 Text Interpretation: Normal sinus rhythm Left axis deviation Abnormal ECG When compared with ECG of 07-Mar-2022 17:01, PREVIOUS ECG IS PRESENT Confirmed by [place] ([id number]) on 08/16/2022 9:12:48 PM         Imaging Studies ordered: I ordered imaging studies including chest x-ray I independently visualized and interpreted imaging. I agree with the radiologist interpretation   Medicines ordered and prescription drug management: Meds ordered this encounter  Medications   AND Linked Order Group    alum & mag hydroxide-simeth (MAALOX/MYLANTA) 200-200-20 MG/5ML suspension 30 mL    lidocaine (XYLOCAINE) 2 % viscous mouth solution 15 mL    -I have reviewed the patients home medicines and have made adjustments as needed  Critical interventions none    Cardiac Monitoring: The patient was maintained on a cardiac monitor.  I personally viewed and interpreted the cardiac monitored which showed an underlying rhythm of: NSR  Social Determinants of Health:  Factors impacting patients care include: none   Reevaluation: After the interventions noted above,  I reevaluated the patient and found that they have :improved  Co morbidities that complicate the patient evaluation  Past Medical History:  Diagnosis Date   Anxiety    Arthritis    Complication of anesthesia    some type of sedation medication made her feel crazy   Dysrhythmia    Family history of adverse reaction to anesthesia    daughter has Malignant Hyperthermia hx -    GERD (gastroesophageal reflux disease)    Glaucoma    H/O: hysterectomy 1978   Heart disease    History of hiatal hernia    Hypertension    MVP (mitral valve prolapse)    Panic attacks    Pre-diabetes    Sciatica       Dispostion: I considered admission for this patient, but at this time with low risk by Wells criteria and heart score 4, we have shared decision-making and patient will follow-up outpatient with her cardiologist for scheduled coronary CT scan and strict return precautions which she voiced understanding     Final Clinical Impression(s) / ED Diagnoses Final diagnoses:  Atypical chest pain     '@PCDICTATION'$ @    Teressa Lower, MD 08/17/22 1258

## 2022-08-19 ENCOUNTER — Telehealth: Payer: Self-pay

## 2022-08-19 NOTE — Transitions of Care (Post Inpatient/ED Visit) (Signed)
   08/19/2022  Name: GLADYSE DUREE MRN: [id number] DOB: [birth year]  Today's TOC FU Call Status: Today's TOC FU Call Status:: Successful TOC FU Call Competed TOC FU Call Complete Date: 08/19/22  Transition Care Management Follow-up Telephone Call Date of Discharge: 08/16/22 [facility]) Type of Discharge: Emergency Department Reason for ED Visit: Other: ("atypical CP") How have you been since you were released from the hospital?: Better (Patient states that she has had a few of the episodes where she felt 'funny'. She took half a tab of Xanax and some Mylanta as advised by ED provider and sxs were relieved within a few minutes.) Any questions or concerns?: No  Items Reviewed: Did you receive and understand the discharge instructions provided?: Yes Medications obtained and verified?: Yes (Medications Reviewed) Any new allergies since your discharge?: No Dietary orders reviewed?: NA Do you have support at home?: Yes People in Home: spouse Name of Support/Comfort Primary Source: [facility] and Equipment/Supplies: [place] Ordered?: NA Any new equipment or medical supplies ordered?: NA  Functional Questionnaire: Do you need assistance with bathing/showering or dressing?: No Do you need assistance with meal preparation?: No Do you need assistance with eating?: No Do you have difficulty maintaining continence: No Do you need assistance with getting out of bed/getting out of a chair/moving?: No Do you have difficulty managing or taking your medications?: No  Folllow up appointments reviewed: PCP Follow-up appointment confirmed?: [facility] Follow-up appointment confirmed?: No Reason Specialist Follow-Up Not Confirmed: Patient has Specialist Provider Number and will Call for Appointment (Patient states she is having CT-cardiac on 3/11/2. She did not want to make cardiology follow up appt until after she has test done. She  confirms she has contact info and will call and make appt.) Do you need transportation to your follow-up appointment?: No Do you understand care options if your condition(s) worsen?: Yes-patient verbalized understanding  SDOH Interventions Today    Flowsheet Row Most Recent Value  SDOH Interventions   Food Insecurity Interventions Intervention Not Indicated  Transportation Interventions Intervention Not Indicated      TOC Interventions Today    Flowsheet Row Most Recent Value  TOC Interventions   TOC Interventions Discussed/Reviewed TOC Interventions Discussed, Post discharge activity limitations per provider  [cardiac sx mgmt]      Interventions Today    Flowsheet Row Most Recent Value  Education Interventions   Education Provided Provided Education  [sx mgmt]  Provided Verbal Education On Medication, When to see the doctor  Nutrition Interventions   Nutrition Discussed/Reviewed Nutrition Discussed  Pharmacy Interventions   Pharmacy Dicussed/Reviewed Pharmacy Topics Discussed, Medications and their functions  Safety Interventions   Safety Discussed/Reviewed Safety Discussed        Hetty Blend [facility] Care Management Care Management Community Coordinator Direct Phone: [phone] Toll Free: [phone] Fax: [phone]

## 2022-08-20 ENCOUNTER — Other Ambulatory Visit: Payer: Self-pay | Admitting: Nurse Practitioner

## 2022-08-20 DIAGNOSIS — E782 Mixed hyperlipidemia: Secondary | ICD-10-CM

## 2022-08-20 DIAGNOSIS — I10 Essential (primary) hypertension: Secondary | ICD-10-CM

## 2022-08-20 DIAGNOSIS — I700 Atherosclerosis of aorta: Secondary | ICD-10-CM

## 2022-08-21 ENCOUNTER — Encounter: Payer: Self-pay | Admitting: Neurology

## 2022-08-22 ENCOUNTER — Ambulatory Visit: Payer: Self-pay | Admitting: *Deleted

## 2022-08-22 ENCOUNTER — Telehealth (HOSPITAL_COMMUNITY): Payer: Self-pay | Admitting: Emergency Medicine

## 2022-08-22 NOTE — Telephone Encounter (Signed)
Attempted to call patient regarding upcoming cardiac CT appointment. °Left message on voicemail with name and callback number °Talley Kreiser RN Navigator Cardiac Imaging °[facility] °[phone] Office °[phone] Cell ° °

## 2022-08-22 NOTE — Chronic Care Management (AMB) (Signed)
   08/22/2022  Terri Barajas July 08, 1945 [id number]   Enrollment status changed to previously enrolled.  Terri Barajas [facility], BSN RN Case Manager [phone]

## 2022-08-23 ENCOUNTER — Telehealth (HOSPITAL_COMMUNITY): Payer: Self-pay | Admitting: *Deleted

## 2022-08-23 ENCOUNTER — Other Ambulatory Visit (HOSPITAL_COMMUNITY): Payer: Self-pay | Admitting: *Deleted

## 2022-08-23 ENCOUNTER — Telehealth: Payer: Self-pay

## 2022-08-23 DIAGNOSIS — I341 Nonrheumatic mitral (valve) prolapse: Secondary | ICD-10-CM

## 2022-08-23 DIAGNOSIS — Z888 Allergy status to other drugs, medicaments and biological substances status: Secondary | ICD-10-CM

## 2022-08-23 DIAGNOSIS — E782 Mixed hyperlipidemia: Secondary | ICD-10-CM

## 2022-08-23 DIAGNOSIS — R072 Precordial pain: Secondary | ICD-10-CM

## 2022-08-23 DIAGNOSIS — R002 Palpitations: Secondary | ICD-10-CM

## 2022-08-23 DIAGNOSIS — I700 Atherosclerosis of aorta: Secondary | ICD-10-CM

## 2022-08-23 DIAGNOSIS — G4733 Obstructive sleep apnea (adult) (pediatric): Secondary | ICD-10-CM

## 2022-08-23 DIAGNOSIS — I10 Essential (primary) hypertension: Secondary | ICD-10-CM

## 2022-08-23 MED ORDER — PREDNISONE 50 MG PO TABS: ORAL_TABLET | ORAL | 0 refills | Status: DC

## 2022-08-23 MED ORDER — METOPROLOL TARTRATE 100 MG PO TABS: ORAL_TABLET | ORAL | 0 refills | Status: DC

## 2022-08-23 NOTE — Progress Notes (Cosign Needed Addendum)
08-23-2022: Scheduled follow up appointment with Madilyn Hook CPP. 09-04-2022: Rescheduled 09-11-2022 appointment with [name] due to provider being out.  [place] Pharmacist Assistant [phone]

## 2022-08-23 NOTE — Telephone Encounter (Signed)
Reaching out to patient to offer assistance regarding upcoming cardiac imaging study; pt verbalizes understanding of appt date/time, parking situation and where to check in, pre-test NPO status and medications ordered, and verified current allergies; name and call back number provided for further questions should they arise  Terri Clement RN Navigator Cardiac [place] [facility] [phone] office [phone] cell  Reviewed how to take 13 hour prep with patient. She will take meds at 7:30pm, 1:30am, 7:30am. She will take '100mg'$  metoprolol tartrate and '30mg'$  cardizem 2 hours prior to her cardiac CT scan. She is aware to arrive at 8am.

## 2022-08-26 ENCOUNTER — Ambulatory Visit (HOSPITAL_COMMUNITY)
Admission: RE | Admit: 2022-08-26 | Discharge: 2022-08-26 | Disposition: A | Discharge status: Home / Self Care | Payer: Medicare Other | Source: Ambulatory Visit | Attending: Cardiology | Admitting: Cardiology

## 2022-08-26 DIAGNOSIS — E782 Mixed hyperlipidemia: Secondary | ICD-10-CM | POA: Insufficient documentation

## 2022-08-26 DIAGNOSIS — I341 Nonrheumatic mitral (valve) prolapse: Secondary | ICD-10-CM | POA: Insufficient documentation

## 2022-08-26 DIAGNOSIS — G4733 Obstructive sleep apnea (adult) (pediatric): Secondary | ICD-10-CM | POA: Diagnosis present

## 2022-08-26 DIAGNOSIS — I700 Atherosclerosis of aorta: Secondary | ICD-10-CM | POA: Diagnosis not present

## 2022-08-26 DIAGNOSIS — R072 Precordial pain: Secondary | ICD-10-CM | POA: Diagnosis present

## 2022-08-26 DIAGNOSIS — I10 Essential (primary) hypertension: Secondary | ICD-10-CM | POA: Insufficient documentation

## 2022-08-26 DIAGNOSIS — R002 Palpitations: Secondary | ICD-10-CM | POA: Diagnosis not present

## 2022-08-26 MED ORDER — NITROGLYCERIN 0.4 MG SL SUBL: 0.8000 mg | SUBLINGUAL_TABLET | Freq: Once | SUBLINGUAL | Status: AC

## 2022-08-26 MED ORDER — DILTIAZEM HCL 25 MG/5ML IV SOLN: 5.0000 mg | INTRAVENOUS | Status: DC | PRN

## 2022-08-26 MED ORDER — METOPROLOL TARTRATE 5 MG/5ML IV SOLN
INTRAVENOUS | Status: AC
  Administered 2022-08-26: 5 mg via INTRAVENOUS
  Filled 2022-08-26: qty 20

## 2022-08-26 MED ORDER — METOPROLOL TARTRATE 5 MG/5ML IV SOLN
10.0000 mg | INTRAVENOUS | Status: AC | PRN
  Administered 2022-08-26: 5 mg via INTRAVENOUS
  Administered 2022-08-26: 10 mg via INTRAVENOUS

## 2022-08-26 MED ORDER — DILTIAZEM HCL 25 MG/5ML IV SOLN
INTRAVENOUS | Status: AC
  Administered 2022-08-26: 5 mg via INTRAVENOUS
  Filled 2022-08-26: qty 5

## 2022-08-26 MED ORDER — IOHEXOL 350 MG/ML SOLN
100.0000 mL | Freq: Once | INTRAVENOUS | Status: AC | PRN
  Administered 2022-08-26: 100 mL via INTRAVENOUS

## 2022-08-26 MED ORDER — NITROGLYCERIN 0.4 MG SL SUBL
SUBLINGUAL_TABLET | SUBLINGUAL | Status: AC
  Administered 2022-08-26: 0.8 mg via SUBLINGUAL
  Filled 2022-08-26: qty 2

## 2022-08-29 DIAGNOSIS — M1712 Unilateral primary osteoarthritis, left knee: Secondary | ICD-10-CM | POA: Diagnosis not present

## 2022-09-06 ENCOUNTER — Other Ambulatory Visit: Payer: Self-pay | Admitting: Cardiovascular Disease

## 2022-09-06 NOTE — Telephone Encounter (Signed)
Rx request sent to pharmacy.  

## 2022-09-10 ENCOUNTER — Ambulatory Visit
Admission: RE | Admit: 2022-09-10 | Discharge: 2022-09-10 | Disposition: A | Discharge status: Home / Self Care | Payer: Medicare Other | Source: Ambulatory Visit | Attending: Pulmonary Disease | Admitting: Pulmonary Disease

## 2022-09-10 DIAGNOSIS — J849 Interstitial pulmonary disease, unspecified: Secondary | ICD-10-CM

## 2022-09-10 DIAGNOSIS — J479 Bronchiectasis, uncomplicated: Secondary | ICD-10-CM | POA: Diagnosis not present

## 2022-09-10 DIAGNOSIS — I700 Atherosclerosis of aorta: Secondary | ICD-10-CM | POA: Diagnosis not present

## 2022-09-16 ENCOUNTER — Telehealth: Payer: Self-pay

## 2022-09-16 NOTE — Progress Notes (Signed)
Care Management & Coordination Services Pharmacy Team  Reason for Encounter: Appointment Reminder  Contacted patient to confirm telephone appointment with [name], PharmD on 09-18-2022 at 11:00. Spoke with patient on 09/16/2022   Do you have any problems getting your medications? No  What is your top health concern you would like to discuss at your upcoming visit? Patient stated she wasn't in private to discuss anything.  Have you seen any other providers since your last visit with PCP? Yes   Chart review: Recent office visits:  07-22-2022 [name], [place]. Glucose= 105, BUN/Creatinine Ratio= 30. Ferritin= 183. Finished hydrocodone,prednisone and tessalon. Shingrix given.  05-02-2022 Kellie Simmering, LPN. Medicare wellness visit. Recent consult visits:  08-29-2022 Beatriz Stallion, MD ([facility]). Follow up visit for left knee.  08-09-2022 Marshell Garfinkel, MD (Pulmonology). CT chest and pulmonary function test ordered. Stop omega 3 per patient's preference.  08-06-2022 Trudi Ida, NP (Cardiology). EKG completed. CT coronary morph w/ CTA cor w/ score...ordered. metoprolol 100 mg 1 tablet 2 hours prior to cardiac CTA.  07-18-2022 Jonelle Sidle [place], PA ([facility]). Visit for third left knee hyaluronic acid injection   07-11-2022 McClung, [facility], PA ([facility]). Visit for second left knee hyaluronic acid injection.  07-04-2022 Gretel Acre, PA ([facility]). Visit for first left knee hyaluronic acid injection   06-20-2022  McClung, [facility] [place], PA ([facility]). Follow up visit for partial medial lateral meniscectomy on left knee.  05-15-2022 Gretel Acre, PA ([facility]). Follow up visit for partial medial lateral meniscectomy on left knee.  04-03-2022 Victorino December, MD ([facility]).  Follow up visit for partial medial lateral meniscectomy on left knee.  03-18-2022 Victorino December, MD ([facility]).  Follow up visit for  partial medial lateral meniscectomy on left knee.  Hospital visits:  Medication Reconciliation was completed by comparing discharge summary, patient's EMR and Pharmacy list, and upon discussion with patient.  Admitted to the hospital on 08-16-2022 due to Atypical chest pain . Discharge date was 08-16-2022. Discharged from [facility] health at [place]?Medications Started at [facility] Discharge:?? None  Medication Changes at Hospital Discharge: None  Medications Discontinued at Hospital Discharge: None  Medications that remain the same after Hospital Discharge:??  -All other medications will remain the same.     Star Rating Drugs: None  Care Gaps: Annual wellness visit in last year? Yes Covid booster overdue   [place] Clinical Pharmacist Assistant [phone]

## 2022-09-17 ENCOUNTER — Encounter: Payer: Self-pay | Admitting: Neurology

## 2022-09-17 ENCOUNTER — Ambulatory Visit: Payer: Medicare Other | Admitting: Neurology

## 2022-09-17 VITALS — BP 117/71 | HR 85 | Ht 63.0 in | Wt 151.0 lb

## 2022-09-17 DIAGNOSIS — G4733 Obstructive sleep apnea (adult) (pediatric): Secondary | ICD-10-CM | POA: Diagnosis not present

## 2022-09-17 MED ORDER — BENZONATATE 100 MG PO CAPS: 100.0000 mg | ORAL_CAPSULE | Freq: Every evening | ORAL | 0 refills | Status: DC | PRN

## 2022-09-17 MED ORDER — GUAIFENESIN ER 600 MG PO TB12: 600.0000 mg | ORAL_TABLET | Freq: Two times a day (BID) | ORAL | 1 refills | Status: DC | PRN

## 2022-09-17 NOTE — Progress Notes (Signed)
Provider:  Larey Seat, MD  Primary Care Physician:  [name], [place] [id number]     Referring Provider: [name], [place],  [place] [id number]          Chief Complaint according to patient   Patient presents with:     New Patient (Initial Visit)           HISTORY OF PRESENT ILLNESS:  Terri Barajas is a 77 y.o. female patient who is here for revisit 09/17/2022 for  CPAP follow up: .  No chief concern according to patient :  Dry throat and mouth.  Terri Barajas had undergone a home sleep test in 2020 and based on this had started CPAP therapy she is using an auto titration CPAP between 5 and 13 cm water pressure with 3 cm expiratory relief and a residual AHI of 0.5 as documented.  Her compliance is 100% for days and 83% 4 hours with an average of 5 hours 28 minutes.  12.9 cm water is her 95th percentile pressure and she has low air leaks.  Based on this and the age of her machine of not being 5 years yet I am very happy with the compliance and the apnea reduction.  The fatigue severity scale was endorsed at 14 out of 63 points, the Epworth Sleepiness Scale was endorsed at 7 out of 24 points and she and she endorsed 3 out of 15 points on the depression score which is not indicative of depression.      Terri Barajas is a 77 y.o.AA female patient, she is seen here in [facility] . 09-03-2021.  This is my first revisit with Terri Barajas since her initial consultation in January 2020 and she was referred by her nurse practitioner Darleen Crocker.  Her husband had complained about her snoring and she woke up with a very dry mouth and sore throat.  She underwent sleep testing which confirms the presence of apnea and was prescribed CPAP.  She is using an AutoSet between a pressure setting of 5 and 12 cmH2O with 3 cm water expiratory relief setting.  Her average user time is 4 hours 44 minutes her  compliance for days is 100% the compliance for over 4 hours is 78%.  Some nights especially very recently she has just fallen short of the 4 hours use.  She does have a significant enough air leak but her residual AHI is low and I do not see a reason to intervene.   She uses a FFM and she hates the headgear- would like to try a mask that has ear loops.  Her residual AHI is 0.5.  Her pressure setting at the 95th percentile is 10 cm water and her air leak at the 95th percentile is 35 L.  The air leak varies greatly that may be related to sleep habits or if the mask was recently exchanged for a new liner.  The Epworth Sleepiness Scale was today endorsed at 5 out of 24 points the fatigue severity scale 10 out of 63 points.  The geriatric depression score was endorsed at only one-point out of 15. She reports she no longer snores, but that may depend on her interface.            Consult took place on 06-30-2018 upon referral from NP . Moore for a sleep evaluation. The patient considers herself  a mouth breather, her husband stated she snores. She has woken up with a very dry mouth and sore throat in November , while heating the house. She drank water through the night. The dry mouth disappeared in December.   Chief complaint according to patient : " I am very cold natured and have a dry mouth".    Sleep habits are as follows: dinner time for Terri Barajas is around 5-6 PM. She is retired as is her husband. She  Spends evenings watching TV, doing housekeeping. He would go to bed at 10 Pm .  She sleeps easily and wakes up once for nocturia. She may fall asleep on the sofa watching TV.  Sleeps on average 6-8 hours, wakes up spontaneously in AM when her husband leaves for work. Feeling refreshed, no headaches, no nausea.    Sleep medical history: no tonsillectomy, snoring , but no apnea being witnessed.      Review of Systems: Out of a complete 14 system review, the patient complains of only the  following symptoms, and all other reviewed systems are negative.:     How likely are you to doze in the following situations: 0 = not likely, 1 = slight chance, 2 = moderate chance, 3 = high chance   Sitting and Reading? Watching Television? Sitting inactive in a public place (theater or meeting)? As a passenger in a car for an hour without a break? Lying down in the afternoon when circumstances permit? Sitting and talking to someone? Sitting quietly after lunch without alcohol? In a car, while stopped for a few minutes in traffic?   The fatigue severity scale was endorsed at 14 out of 63 points, the Epworth Sleepiness Scale was endorsed at 7 out of 24 points and she and she endorsed 3 out of 15 points on the depression score which is not indicative of depression.   Acid reflux and chronic Coughing.  Thick phlegm.   Social History   Socioeconomic History   Marital status: Married    Spouse name: Not on file   Number of children: Not on file   Years of education: 12   Highest education level: Not on file  Occupational History   Occupation: retired  Tobacco Use   Smoking status: Never    Passive exposure: Never   Smokeless tobacco: Never  Vaping Use   Vaping Use: Never used  Substance and Sexual Activity   Alcohol use: Never   Drug use: Never   Sexual activity: Yes    Partners: Male  Other Topics Concern   Not on file  Social History Narrative   Not on file   Social Determinants of Health   Financial Resource Strain: Low Risk  (05/02/2022)   Overall Financial Resource Strain (CARDIA)    Difficulty of Paying Living Expenses: Not hard at all  Food Insecurity: No Food Insecurity (08/19/2022)   Hunger Vital Sign    Worried About Running Out of Food in the Last Year: Never true    [place] in the Last Year: Never true  Transportation Needs: No Transportation Needs (08/19/2022)   PRAPARE - Hydrologist (Medical): No    Lack of  Transportation (Non-Medical): No  Physical Activity: Insufficiently Active (05/02/2022)   Exercise Vital Sign    Days of Exercise per Week: 3 days    Minutes of Exercise per Session: 30 min  Stress: No Stress Concern Present (05/02/2022)   [employer] - Occupational Stress Questionnaire    Feeling of Stress : Not at all  Social Connections: Not on file    Family History  Problem Relation Age of Onset   Pancreatic cancer Mother    Alcohol abuse Father    Asthma Sister    Hypertension Sister    Arthritis Sister    Diabetes Brother    Arthritis Brother    Hyperlipidemia Brother    Hypertension Brother    Glaucoma Brother    Colon polyps Brother    Arthritis Sister    Arthritis Sister    Prostate cancer Brother    Congenital heart disease Brother    Colon cancer Neg Hx    Esophageal cancer Neg Hx    Inflammatory bowel disease Neg Hx    Liver disease Neg Hx    Rectal cancer Neg Hx    Stomach cancer Neg Hx     Past Medical History:  Diagnosis Date   Anxiety    Arthritis    Complication of anesthesia    some type of sedation medication made her feel crazy   Dysrhythmia    Family history of adverse reaction to anesthesia    daughter has Malignant Hyperthermia hx -    GERD (gastroesophageal reflux disease)    Glaucoma    H/O: hysterectomy 1978   Heart disease    History of hiatal hernia    Hypertension    MVP (mitral valve prolapse)    Panic attacks    Pre-diabetes    Sciatica     Past Surgical History:  Procedure Laterality Date   APPENDECTOMY  1972   BACK SURGERY  07/14/2020   chipped disc   BIOPSY  07/06/2018   Procedure: BIOPSY;  Surgeon: Irving Copas., MD;  Location[facility] ENDOSCOPY;  Service: Gastroenterology;;   BIOPSY  03/04/2022   Procedure: BIOPSY;  Surgeon: Irving Copas., MD;  Location: [facility] ENDOSCOPY;  Service: Gastroenterology;;   CATARACT EXTRACTION  2013   CESAREAN SECTION  1976 and 1978    COLONOSCOPY     COLONOSCOPY WITH PROPOFOL N/A 03/04/2022   Procedure: COLONOSCOPY WITH PROPOFOL;  Surgeon: Irving Copas., MD;  Location: [facility] ENDOSCOPY;  Service: Gastroenterology;  Laterality: N/A;   ESOPHAGOGASTRODUODENOSCOPY (EGD) WITH PROPOFOL N/A 07/06/2018   Procedure: ESOPHAGOGASTRODUODENOSCOPY (EGD) WITH PROPOFOL;  Surgeon: Rush Landmark Telford Nab., MD;  Location: [facility];  Service: Gastroenterology;  Laterality: N/A;   ESOPHAGOGASTRODUODENOSCOPY (EGD) WITH PROPOFOL N/A 03/04/2022   Procedure: ESOPHAGOGASTRODUODENOSCOPY (EGD) WITH PROPOFOL;  Surgeon: Rush Landmark Telford Nab., MD;  Location: [facility] ENDOSCOPY;  Service: Gastroenterology;  Laterality: N/A;   HAND SURGERY Bilateral    HEMORRHOID SURGERY  2018   KNEE CARTILAGE SURGERY Left 01/28/2022   POLYPECTOMY  03/04/2022   Procedure: POLYPECTOMY;  Surgeon: Mansouraty, Telford Nab., MD;  Location: [facility] ENDOSCOPY;  Service: Gastroenterology;;   S/P Hysterectomy   1987   SAVORY DILATION N/A 03/04/2022   Procedure: Azzie Almas DILATION;  Surgeon: Irving Copas., MD;  Location: [facility] ENDOSCOPY;  Service: Gastroenterology;  Laterality: N/A;     Current Outpatient Medications on File Prior to Visit  Medication Sig Dispense Refill   acetaminophen (TYLENOL) 500 MG tablet Take 500-1,000 mg by mouth every 6 (six) hours as needed for moderate pain.     ALPRAZolam (XANAX) 0.25 MG tablet Take 1 tablet (0.25 mg total) by mouth daily as needed for anxiety. 30 tablet 0   ascorbic acid (VITAMIN C) 500 MG tablet Take 500 mg by mouth daily.  aspirin 81 MG chewable tablet Chew 81 mg by mouth daily.     Biotin 5 MG CAPS Take 5 mg by mouth daily.     calcium carbonate (OSCAL) 1500 (600 Ca) MG TABS tablet Take 600 mg of elemental calcium by mouth daily.     cholecalciferol (VITAMIN D3) 25 MCG (1000 UNIT) tablet Take 3,000 Units by mouth daily.     diltiazem (CARDIZEM) 30 MG tablet Take 1 tablet (30 mg total) by mouth 2 (two) times daily. 200 tablet  2   dorzolamide-timolol (COSOPT) 22.3-6.8 MG/ML ophthalmic solution Apply 1 drop to eye in the morning and at bedtime.     Evolocumab (REPATHA SURECLICK) [id number] MG/ML SOAJ Inject 140 mg into the skin every 14 (fourteen) days. 2 mL 11   fluticasone (FLONASE) 50 MCG/ACT nasal spray Place 1 spray into both nostrils daily. 16 g 0   hydrochlorothiazide (HYDRODIURIL) 25 MG tablet Take 1 tablet (25 mg total) by mouth daily. 90 tablet 2   ibuprofen (ADVIL) 200 MG tablet Take 200-400 mg by mouth every 6 (six) hours as needed for moderate pain.     latanoprost (XALATAN) 0.005 % ophthalmic solution Place 1 drop into both eyes at bedtime.      loratadine (CLARITIN REDITABS) 10 MG dissolvable tablet Take 10 mg by mouth daily as needed for allergies.     metoprolol tartrate (LOPRESSOR) 100 MG tablet Take one tablet two hours prior to cardiac CTA. 1 tablet 0   Multiple Vitamins-Minerals (CENTRUM SILVER 50+WOMEN PO) Take 1 tablet by mouth daily.     omeprazole (PRILOSEC) 20 MG capsule Take 20 mg by mouth 2 (two) times daily before a meal.     polyethylene glycol (MIRALAX / GLYCOLAX) packet Take 17 g by mouth daily as needed for moderate constipation.     Probiotic Product (ALIGN PO) Take 1 capsule by mouth daily.     RHOPRESSA 0.02 % SOLN Place 1 drop into both eyes every evening.     vitamin E 180 MG (400 UNITS) capsule Take 400 Units by mouth daily.     aluminum-magnesium hydroxide 200-200 MG/5ML suspension Take 5 mLs by mouth every 6 (six) hours as needed for indigestion. (Patient not taking: Reported on 09/17/2022)     diphenhydrAMINE (BENADRYL) 50 MG tablet Take 1 tablet (50mg ) 1 hr prior to CT appt (Patient not taking: Reported on 09/17/2022) 1 tablet 0   predniSONE (DELTASONE) 50 MG tablet Take 1 tablet (50mg ) 13 hr prior to CT scan, take 1 tablet (50mg ) 7 hr prior to CT scan, take 1 tablet (50mg ) 1 hr prior to CT scab (Patient not taking: Reported on 09/17/2022) 3 tablet 0   No current facility-administered  medications on file prior to visit.    Allergies  Allergen Reactions   Shellfish Allergy Anaphylaxis and Shortness Of Breath   Atorvastatin     Myalgias    Atracurium & Derivatives Other (See Comments)    Unknown   Atropine Other (See Comments)    Heart racing   Iodine Other (See Comments)    Unknown   Statins     "didn't feel right"      DIAGNOSTIC DATA (LABS, IMAGING, TESTING) - I reviewed patient records, labs, notes, testing and imaging myself where available.  Lab Results  Component Value Date   WBC 10.0 08/16/2022   HGB 12.5 08/16/2022   HCT 38.6 08/16/2022   MCV 90.6 08/16/2022   PLT 260 08/16/2022      Component Value Date/Time  NA 134 (L) 08/16/2022 1907   NA 140 07/22/2022 1056   K 3.3 (L) 08/16/2022 1907   CL 95 (L) 08/16/2022 1907   CO2 29 08/16/2022 1907   GLUCOSE 99 08/16/2022 1907   BUN 15 08/16/2022 1907   BUN 21 07/22/2022 1056   CREATININE 0.80 08/16/2022 1907   CALCIUM 9.6 08/16/2022 1907   PROT 6.9 08/18/2020 1238   ALBUMIN 4.2 08/18/2020 1238   AST 13 08/18/2020 1238   ALT 16 08/18/2020 1238   ALKPHOS 83 08/18/2020 1238   BILITOT 0.5 08/18/2020 1238   GFRNONAA >60 08/16/2022 1907   GFRAA 83 03/01/2020 1106   Lab Results  Component Value Date   CHOL 163 07/22/2022   HDL 63 07/22/2022   LDLCALC 83 07/22/2022   TRIG 94 07/22/2022   CHOLHDL 2.6 07/22/2022   Lab Results  Component Value Date   HGBA1C 6.3 (H) 12/19/2021   Lab Results  Component Value Date   [id number] 07/22/2022   Lab Results  Component Value Date   TSH 1.22 08/03/2019    PHYSICAL EXAM:  Today's Vitals   09/17/22 1059  BP: 117/71  Pulse: 85  Weight: 151 lb (68.5 kg)  Height: 5\' 3"  (1.6 m)   Body mass index is 26.75 kg/m.   Wt Readings from Last 3 Encounters:  09/17/22 151 lb (68.5 kg)  08/16/22 150 lb (68 kg)  08/09/22 150 lb (68 kg)     Ht Readings from Last 3 Encounters:  09/17/22 5\' 3"  (1.6 m)  08/16/22 5\' 3"  (1.6 m)  08/09/22 5\' 3"   (1.6 m)     77 y.o.  with history of hypertension, sleep apnea [follows with Dr. Perez Dirico.] Referred for evaluation of chronic cough.  She was diagnosed with COVID-19 in January 2021.  Did not require hospitalization and was treated with monoclonal antibody Has developed chronic nonproductive cough since then.  Not associated with dyspnea, wheezing, sputum production. Complains of irritation at the back of the throat and feeling of mucus stuck..   Show significant acid reflux and is treated with Dexilant by Dr. Rush Landmark.  Also has sleep apnea for which she is on CPAP by Dr. Keturah Barre.       General: The patient is awake, alert and appears not in acute distress. The patient is well groomed and well dressed, eloquent.  Head: Normocephalic, atraumatic. Neck is supple. Mallampati 2-3  neck circumference: 15.5 . Nasal airflow patent   Retrognathia is not seen.  Cardiovascular:  Regular rate and rhythm, without  murmurs or carotid bruit, and without distended neck veins. Respiratory: Lungs are clear to auscultation. Skin:  Without evidence of edema, or rash Trunk: BMI is 27, [name] posture is erect.    Neurologic exam : The patient is awake and alert, oriented to place and time.   Memory subjective described as intact.Attention span & concentration ability appears normal.  Speech is fluent,  without dysarthria, dysphonia or aphasia.  Mood and affect are appropriate.   Cranial nerves: Pupils are equal and briskly reactive to light.  Extraocular movements  in vertical and horizontal planes intact and without nystagmus. Visual fields by finger perimetry are intact. Hearing to finger rub impaired,  Low frequency hearing seems off. No lateralization by Talbert Nan.   Facial sensation intact to fine touch.  Facial motor strength is symmetric and tongue and uvula move midline.  Shoulder shrug was symmetrical.    Motor exam:  Normal tone, muscle bulk and symmetric strength in all  extremities. Sensory:  Fine touch, pinprick and vibration were tested in all extremities.  Coordination: normal penmanship. Finger-to-nose maneuver normal without evidence of ataxia, dysmetria or tremor. Gait and station: Patient walks without assistive device. Strength within normal limits. Stance is stable and normal.   Deep tendon reflexes: in the  upper and lower extremities are symmetric and intact. Babinski maneuver response is  Downgoing.     Labs; elevated glucose and BUN, normal TSH and T4.     ASSESSMENT AND PLAN 77 y.o. year old female  here with:   compliant with CPAP and good control of AHI but very severe through at irritation and coughing, history of COVID and Interstitial lung Disease.  Has tried  Biotin mouth wash, Xylit gum, nasonex.  Reset the humidifier.    1) I like for her to try a cough Supressant. I wrote for  tessalon pearls.   2) try mucinex  po , OTC without decongestant. Lots of fluid to be taken, hot liquids.  .   3) continue CPAP use.  Please check with pulmonologist for bronchitis.   I plan to follow up either personally or through our NP within 12 months.  You are due for a new CPAP or BiPAP machine in 2025     I would like to thank [name], [place] [name], [facility] [place],  [place] [id number] for allowing me to meet with and to take care of this pleasant patient.   CC: I will share my notes with PCP .  After spending a total time of  15  minutes face to face and additional time for physical and neurologic examination, review of laboratory studies,  personal review of imaging studies, reports and results of other testing and review of referral information / records as far as provided in visit,   Electronically signed by: Larey Seat, MD 09/17/2022 11:24 AM  [facility] Board certified by The [employer] of Sleep Medicine and Diplomate of the [employer] of Sleep  Medicine. Board certified In Neurology through the [place], Fellow of the [employer] of Neurology. Medical Director of [facility].

## 2022-09-17 NOTE — Patient Instructions (Signed)
77 y.o. year old female  here with:   compliant with CPAP and good control of AHI but very severe through at irritation and coughing, history of COVID and Interstitial lung Disease.  Has tried  Biotin mouth wash, Xylit gum, nasonex.  Reset the humidifier.    1) I like for her to try a cough Supressant. I wrote for  tessalon pearls.   2) try mucinex  po , OTC without decongestant. Lots of fluid to be taken, hot liquids.  .   3) continue CPAP use.  Please check with pulmonologist for bronchitis.   No orders of the defined types were placed in this encounter.    I plan to follow up either personally or through our NP within 12 months.  You are due for a new CPAP or BiPAP machine in 2025

## 2022-09-18 ENCOUNTER — Ambulatory Visit: Payer: Medicare Other

## 2022-09-18 NOTE — Progress Notes (Signed)
Care Management & Coordination Services Pharmacy Note  09/18/2022 Name:  Terri Barajas MRN:  [id number] DOB:  1946-02-05  Summary: Patient reports that she has been doing much better since starting her Cardizem.   Recommendations/Changes made from today's visit: Recommend she not add any other OTC vitamins or supplements to her current medication regimen due to risk of interactions and harm to her since they are not FDA regulated.   Follow up plan: Follow up with patient on her eating habits and increasing fresh vegetables.  She is going to continue to check her BP daily.    Subjective: Terri Barajas is an 77 y.o. year old female who is a primary patient of [name], [place].  The care coordination team was consulted for assistance with disease management and care coordination needs.    Engaged with patient by telephone for follow up visit.  Recent office visits:  07-22-2022 [name], [place]. Glucose= 105, BUN/Creatinine Ratio= 30. Ferritin= 183. Finished hydrocodone,prednisone and tessalon. Shingrix given.   05-02-2022 Kellie Simmering, LPN. Medicare wellness visit. Recent consult visits:  08-29-2022 Beatriz Stallion, MD ([facility]). Follow up visit for left knee.   08-09-2022 Marshell Garfinkel, MD (Pulmonology). CT chest and pulmonary function test ordered. Stop omega 3 per patient's preference.   08-06-2022 Trudi Ida, NP (Cardiology). EKG completed. CT coronary morph w/ CTA cor w/ score...ordered. metoprolol 100 mg 1 tablet 2 hours prior to cardiac CTA.   07-18-2022 Jonelle Sidle Agra, PA ([facility]). Visit for third left knee hyaluronic acid injection    07-11-2022 McClung, [facility] Ransom, PA ([facility]). Visit for second left knee hyaluronic acid injection.   07-04-2022 Gretel Acre, PA ([facility]). Visit for first left knee hyaluronic acid injection    06-20-2022  McClung, [facility], PA ([facility]). Follow up visit for  partial medial lateral meniscectomy on left knee.   05-15-2022 Gretel Acre, PA ([facility]). Follow up visit for partial medial lateral meniscectomy on left knee.   04-03-2022 Victorino December, MD ([facility]).  Follow up visit for partial medial lateral meniscectomy on left knee.   03-18-2022 Victorino December, MD ([facility]).  Follow up visit for partial medial lateral meniscectomy on left knee.   Hospital visits:  Medication Reconciliation was completed by comparing discharge summary, patient's EMR and Pharmacy list, and upon discussion with patient.   Admitted to the hospital on 08-16-2022 due to Atypical chest pain . Discharge date was 08-16-2022. Discharged from [facility] health at [place]?Medications Started at [facility] Discharge:?? None   Medication Changes at Hospital Discharge: None   Medications Discontinued at Hospital Discharge: None   Medications that remain the same after Hospital Discharge:??  -All other medications will remain the same.       Objective:  Lab Results  Component Value Date   CREATININE 0.80 08/16/2022   BUN 15 08/16/2022   GFR 90.57 08/03/2019   EGFR 89 07/22/2022   GFRNONAA >60 08/16/2022   GFRAA 83 03/01/2020   NA 134 (L) 08/16/2022   K 3.3 (L) 08/16/2022   CALCIUM 9.6 08/16/2022   CO2 29 08/16/2022   GLUCOSE 99 08/16/2022    Lab Results  Component Value Date/Time   HGBA1C 6.3 (H) 12/19/2021 11:09 AM   HGBA1C 6.1 (H) 02/07/2021 12:17 PM   GFR 90.57 08/03/2019 01:40 PM   GFR 66.53 05/27/2018 10:45 AM   MICROALBUR 30 08/18/2020 11:21 AM    Last diabetic Eye exam:  Lab Results  Component Value  Date/Time   HMDIABEYEEXA No Retinopathy 09/24/2021 12:00 AM    Last diabetic Foot exam: No results found for: "HMDIABFOOTEX"   Lab Results  Component Value Date   CHOL 163 07/22/2022   HDL 63 07/22/2022   LDLCALC 83 07/22/2022   TRIG 94 07/22/2022   CHOLHDL 2.6 07/22/2022       Latest Ref Rng & Units  08/18/2020   12:38 PM 08/03/2019    1:40 PM 07/10/2018    7:10 PM  Hepatic Function  Total Protein 6.0 - 8.5 g/dL 6.9  7.1  7.3   Albumin 3.7 - 4.7 g/dL 4.2  4.0  4.0   AST 0 - 40 IU/L 13  17  21    ALT 0 - 32 IU/L 16  13  20    Alk Phosphatase 44 - 121 IU/L 83  79  61   Total Bilirubin 0.0 - 1.2 mg/dL 0.5  0.5  0.6     Lab Results  Component Value Date/Time   TSH 1.22 08/03/2019 01:40 PM   TSH 1.21 05/27/2018 10:45 AM   FREET4 0.74 05/27/2018 10:45 AM       Latest Ref Rng & Units 08/16/2022    7:07 PM 07/22/2022   10:56 AM 08/18/2020   12:38 PM  CBC  WBC 4.0 - 10.5 K/uL 10.0  5.5  6.7   Hemoglobin 12.0 - 15.0 g/dL 12.5  12.9  12.6   Hematocrit 36.0 - 46.0 % 38.6  39.9  38.4   Platelets 150 - 400 K/uL 260  251  271     Lab Results  Component Value Date/Time   VD25OH 73.0 12/19/2021 11:09 AM   VD25OH 92.9 08/18/2020 12:38 PM   VITAMINB12 882 07/22/2022 10:56 AM    Clinical ASCVD: No  The 10-year ASCVD risk score (Arnett DK, et al., 2019) is: 24.6%   Values used to calculate the score:     Age: 55 years     Sex: Female     Is Non-Hispanic African American: Yes     Diabetic: Yes     Tobacco smoker: No     Systolic Blood Pressure: 123XX123 mmHg     Is BP treated: Yes     HDL Cholesterol: 63 mg/dL     Total Cholesterol: 163 mg/dL        07/22/2022   10:08 AM 05/02/2022    8:24 AM 01/15/2022    3:42 PM  Depression screen PHQ 2/9  Decreased Interest 0 0 1  Down, Depressed, Hopeless 0 0 1  PHQ - 2 Score 0 0 2  Altered sleeping 0  1  Tired, decreased energy 0  1  Change in appetite 0  1  Feeling bad or failure about yourself  0  0  Trouble concentrating 0  0  Moving slowly or fidgety/restless 0  0  Suicidal thoughts 0  0  PHQ-9 Score 0  5     Social History   Tobacco Use  Smoking Status Never   Passive exposure: Never  Smokeless Tobacco Never   BP Readings from Last 3 Encounters:  09/17/22 117/71  08/26/22 (!) 104/59  08/16/22 (!) 150/78   Pulse Readings from  Last 3 Encounters:  09/17/22 85  08/26/22 83  08/16/22 80   Wt Readings from Last 3 Encounters:  09/17/22 151 lb (68.5 kg)  08/16/22 150 lb (68 kg)  08/09/22 150 lb (68 kg)   BMI Readings from Last 3 Encounters:  09/17/22 26.75 kg/m  08/16/22  26.57 kg/m  08/09/22 26.57 kg/m    Allergies  Allergen Reactions   Shellfish Allergy Anaphylaxis and Shortness Of Breath   Atorvastatin     Myalgias    Atracurium & Derivatives Other (See Comments)    Unknown   Atropine Other (See Comments)    Heart racing   Iodine Other (See Comments)    Unknown   Statins     "didn't feel right"     Medications Reviewed Today     Reviewed by Beckie Salts, [place] (Registered Medical Assistant) on 09/17/22 at 1101  Med List Status: <None>   Medication Order Taking? Sig Documenting Provider Last Dose Status Informant  acetaminophen (TYLENOL) 500 MG tablet [id number] Yes Take 500-1,000 mg by mouth every 6 (six) hours as needed for moderate pain. [provider] Taking Active Self  ALPRAZolam (XANAX) 0.25 MG tablet [id number] Yes Take 1 tablet (0.25 mg total) by mouth daily as needed for anxiety. [name], FNP Taking Active Self           Med Note Genella Rife Mar 04, 2022  6:44 AM) 1/2 tab  aluminum-magnesium hydroxide 200-200 MG/5ML suspension [id number] No Take 5 mLs by mouth every 6 (six) hours as needed for indigestion.  Patient not taking: Reported on 09/17/2022   [provider] Not Taking Active Self  ascorbic acid (VITAMIN C) 500 MG tablet [id number] Yes Take 500 mg by mouth daily. [provider] Taking Active Self  aspirin 81 MG chewable tablet [id number] Yes Chew 81 mg by mouth daily. [provider] Taking Active Self  Biotin 5 MG CAPS [id number] Yes Take 5 mg by mouth daily. [provider] Taking Active Self  calcium carbonate (OSCAL) 1500 (600 Ca) MG TABS tablet [id number] Yes Take 600 mg of elemental calcium by mouth daily. [provider] Taking Active Self           Med Note Jilda Roche A   Wed Feb 27, 2022  4:04 PM) On hold due to difficulty swallowing, since august   cholecalciferol (VITAMIN D3) 25 MCG (1000 UNIT) tablet [id number] Yes Take 3,000 Units by mouth daily. [provider] Taking Active Self  diltiazem (CARDIZEM) 30 MG tablet [id number] Yes Take 1 tablet (30 mg total) by mouth 2 (two) times daily. Skeet Latch, MD Taking Active   diphenhydrAMINE (BENADRYL) 50 MG tablet [id number] No Take 1 tablet (50mg ) 1 hr prior to CT appt  Patient not taking: Reported on 09/17/2022   Trudi Ida, NP Not Taking Active   dorzolamide-timolol (COSOPT) 22.3-6.8 MG/ML ophthalmic solution [id number] Yes Apply 1 drop to eye in the morning and at bedtime. [provider] Taking Active Self  Evolocumab (REPATHA SURECLICK) [id number] MG/ML Darden Palmer [id number] Yes Inject 140 mg into the skin every 14 (fourteen) days. Skeet Latch, MD Taking Active Self  fluticasone [facility]) 50 MCG/ACT nasal spray [id number] Yes Place 1 spray into both nostrils daily. Raspet, Derry Skill, PA-C Taking Active   hydrochlorothiazide (HYDRODIURIL) 25 MG tablet [id number] Yes Take 1 tablet (25 mg total) by mouth daily. [name], FNP Taking Active Self  ibuprofen (ADVIL) 200 MG tablet [id number] Yes Take 200-400 mg by mouth every 6 (six) hours as needed for moderate pain. [provider] Taking Active Self  latanoprost (XALATAN) 0.005 % ophthalmic solution [id number] Yes Place 1 drop into both eyes at bedtime.  [provider] Taking Active Self  loratadine (CLARITIN REDITABS) 10 MG dissolvable tablet [id number] Yes Take 10 mg  by mouth daily as needed for allergies. [provider] Taking Active Self  metoprolol tartrate (LOPRESSOR) 100 MG tablet [id number] Yes Take one tablet two hours prior to cardiac CTA. Trudi Ida, NP Taking Active   Multiple Vitamins-Minerals (CENTRUM SILVER 50+WOMEN PO) [id number] Yes  Take 1 tablet by mouth daily. [provider] Taking Active Self           Med Note Jilda Roche A   Wed Feb 27, 2022  4:04 PM) On hold due to difficulty swallowing, since august   omeprazole (PRILOSEC) 20 MG capsule [id number] Yes Take 20 mg by mouth 2 (two) times daily before a meal. [provider] Taking Active Self  polyethylene glycol (MIRALAX / GLYCOLAX) packet [id number] Yes Take 17 g by mouth daily as needed for moderate constipation. [provider] Taking Active Self  predniSONE (DELTASONE) 50 MG tablet [id number] No Take 1 tablet (50mg ) 13 hr prior to CT scan, take 1 tablet (50mg ) 7 hr prior to CT scan, take 1 tablet (50mg ) 1 hr prior to CT scab  Patient not taking: Reported on 09/17/2022   Trudi Ida, NP Not Taking Active   Probiotic Product (ALIGN PO) [id number] Yes Take 1 capsule by mouth daily. [provider] Taking Active Self  RHOPRESSA 0.02 % SOLN [id number] Yes Place 1 drop into both eyes every evening. [provider] Taking Active Self  vitamin E 180 MG (400 UNITS) capsule [id number] Yes Take 400 Units by mouth daily. [provider] Taking Active Self            SDOH:  (Social Determinants of Health) assessments and interventions performed: No SDOH Interventions    Flowsheet Row Telephone from 08/19/2022 in [facility] from 05/02/2022 in [facility] Chronic Care Management from 05/26/2020 in [facility]  SDOH Interventions     Food Insecurity Interventions Intervention Not Indicated Intervention Not Indicated Intervention Not Indicated  Housing Interventions -- -- Intervention Not Indicated  Transportation Interventions Intervention Not Indicated Intervention Not Indicated Intervention Not Indicated  Financial Strain Interventions -- Intervention Not Indicated --  Physical Activity  Interventions -- Intervention Not Indicated --  Stress Interventions -- Intervention Not Indicated --       Medication Assistance: None required.  Patient affirms current coverage meets needs.  Medication Access: Within the past 30 days, how often has patient missed a dose of medication? No Is a pillbox or other method used to improve adherence? No  Factors that may affect medication adherence? lack of understanding of disease management Are meds synced by current pharmacy? No  Are meds delivered by current pharmacy? No  Does patient experience delays in picking up medications due to transportation concerns? No   [facility] Reviewed: Is patient disadvantaged to use [facility]?: Yes  Current Rx insurance plan: [employer] Medicare Name and location of Current pharmacy:  [id number] [address], [place] [address] Phone: [phone] Fax: [phone]  [facility] - [place], [place] [facility] [address] [place] [address] [place] [address] Phone: [phone] Fax: [place], [place] [address] Phone: [phone] Fax: [phone]  [facility] services reviewed with patient today?: No   Compliance/Adherence/Medication fill history: Care Gaps: Covid booster overdue  Star-Rating Drugs: Not applicable    Assessment/Plan   Hypertension (BP goal <130/80) -Controlled -Current treatment: Hydrochlorothiazide 25 mg tablet once per day Appropriate, Effective, Safe, Accessible Metoprolol tartrate 100 mg tablet once per day Appropriate, Effective, Safe, Accessible Diltiazem 30 mg tablet twice per day Appropriate, Effective, Safe, Accessible -Current home readings: 129/71 pulse: 72  -Current dietary habits: yesterday she had a Banana, chicken drummettes, cole  slaw, hot dog and [employer] baked beans  -Current exercise habits: will discuss during next office visit  -Denies hypotensive/hypertensive symptoms -Educated on BP goals and benefits of medications for prevention of heart attack, stroke and kidney damage; -We discussed increasing the amount of vegetables she is eating, she is going to increase the amount of peas she is eating as well  -Counseled to monitor BP at home at least , document, and provide log at future appointments -Collaborated with patient and she is going to eat a handful of vegetables per day  Cherylin Mylar, CPP, PharmD Clinical Pharmacist Practitioner [facility] [phone]

## 2022-10-02 ENCOUNTER — Ambulatory Visit (INDEPENDENT_AMBULATORY_CARE_PROVIDER_SITE_OTHER): Payer: Medicare Other | Admitting: Pulmonary Disease

## 2022-10-02 ENCOUNTER — Ambulatory Visit: Payer: Medicare Other | Admitting: Primary Care

## 2022-10-02 ENCOUNTER — Other Ambulatory Visit: Payer: Self-pay | Admitting: *Deleted

## 2022-10-02 DIAGNOSIS — J849 Interstitial pulmonary disease, unspecified: Secondary | ICD-10-CM

## 2022-10-02 LAB — PULMONARY FUNCTION TEST
DL/VA % pred: 128 %
DL/VA: 5.34 ml/min/mmHg/L
DLCO cor % pred: 95 %
DLCO cor: 17.69 ml/min/mmHg
DLCO unc % pred: 92 %
DLCO unc: 17.19 ml/min/mmHg
FEF 25-75 Post: 2.15 L/sec
FEF 25-75 Pre: 2.17 L/sec
FEF2575-%Change-Post: -1 %
FEF2575-%Pred-Post: 129 %
FEF2575-%Pred-Pre: 131 %
FEV1-%Change-Post: 0 %
FEV1-%Pred-Post: 77 %
FEV1-%Pred-Pre: 76 %
FEV1-Post: 1.58 L
FEV1-Pre: 1.57 L
FEV1FVC-%Change-Post: -4 %
FEV1FVC-%Pred-Pre: 115 %
FEV6-%Change-Post: 6 %
FEV6-%Pred-Post: 73 %
FEV6-%Pred-Pre: 69 %
FEV6-Post: 1.92 L
FEV6-Pre: 1.8 L
FEV6FVC-%Change-Post: 0 %
FEV6FVC-%Pred-Post: 105 %
FEV6FVC-%Pred-Pre: 104 %
FVC-%Change-Post: 5 %
FVC-%Pred-Post: 70 %
FVC-%Pred-Pre: 66 %
FVC-Post: 1.92 L
FVC-Pre: 1.81 L
Post FEV1/FVC ratio: 82 %
Post FEV6/FVC ratio: 100 %
Pre FEV1/FVC ratio: 87 %
Pre FEV6/FVC Ratio: 100 %
RV % pred: 78 %
RV: 1.75 L
TLC % pred: 76 %
TLC: 3.73 L

## 2022-10-02 NOTE — Patient Instructions (Signed)
Full PFT performed today. °

## 2022-10-02 NOTE — Progress Notes (Signed)
Full PFT performed today. °

## 2022-10-10 ENCOUNTER — Encounter: Payer: Self-pay | Admitting: Nurse Practitioner

## 2022-10-14 NOTE — Progress Notes (Unsigned)
@Patient  ID: Terri Barajas, female    DOB: 1945-12-15, 77 y.o.   MRN: [id number]  No chief complaint on file.   Referring provider: Arnette Felts, FNP  HPI: 77 year old female.  Medical history significant for interstitial pulmonary disease, hiatal hernia, chronic hypoxic respiratory failure, LPR, OSA on CPAP, hypertension, aortic arthrosclerosis, mitral valve prolapse, per lipidemia, prediabetes.    10/15/2022 Patient presents today Chronic cough post covid 19 CT with mild reticular changes at bases consistent with post-COVID ILD, this is remained stable with normal pulmonary function testing Suspected ongoing symptoms from upper airway cough and GERD Continue Dexilant, antihistamine and flonase OSA managed by Dr. Vickey Huger with neurology   Allergies  Allergen Reactions   Shellfish Allergy Anaphylaxis and Shortness Of Breath   Atorvastatin     Myalgias    Atracurium & Derivatives Other (See Comments)    Unknown   Atropine Other (See Comments)    Heart racing   Iodine Other (See Comments)    Unknown   Statins     "didn't feel right"     Immunization History  Administered Date(s) Administered   Fluad Quad(high Dose 65+) 03/11/2019, 03/27/2022   Influenza, High Dose Seasonal PF 04/29/2018, 02/26/2021   Influenza-Unspecified 03/16/2020   PFIZER Comirnaty(Gray Top)Covid-19 Tri-Sucrose Vaccine 12/26/2020   PFIZER(Purple Top)SARS-COV-2 Vaccination 09/02/2019, 09/28/2019, 03/27/2020, 04/14/2021   Pfizer Covid-19 Vaccine Bivalent Booster 46yrs & up 04/14/2021   Pneumococcal Conjugate-13 03/11/2019   Pneumococcal Polysaccharide-23 06/09/2017   Tdap 02/10/2021   Unspecified SARS-COV-2 Vaccination 04/13/2022   Zoster Recombinat (Shingrix) 07/12/2021, 07/22/2022    Past Medical History:  Diagnosis Date   Anxiety    Arthritis    Complication of anesthesia    some type of sedation medication made her feel crazy   Dysrhythmia    Family history of adverse reaction to  anesthesia    daughter has Malignant Hyperthermia hx -    GERD (gastroesophageal reflux disease)    Glaucoma    H/O: hysterectomy 1978   Heart disease    History of hiatal hernia    Hypertension    MVP (mitral valve prolapse)    Panic attacks    Pre-diabetes    Sciatica     Tobacco History: Social History   Tobacco Use  Smoking Status Never   Passive exposure: Never  Smokeless Tobacco Never   Counseling given: Not Answered   Outpatient Medications Prior to Visit  Medication Sig Dispense Refill   acetaminophen (TYLENOL) 500 MG tablet Take 500-1,000 mg by mouth every 6 (six) hours as needed for moderate pain.     ALPRAZolam (XANAX) 0.25 MG tablet Take 1 tablet (0.25 mg total) by mouth daily as needed for anxiety. 30 tablet 0   aluminum-magnesium hydroxide 200-200 MG/5ML suspension Take 5 mLs by mouth every 6 (six) hours as needed for indigestion. (Patient not taking: Reported on 09/17/2022)     ascorbic acid (VITAMIN C) 500 MG tablet Take 500 mg by mouth daily.     aspirin 81 MG chewable tablet Chew 81 mg by mouth daily.     benzonatate (TESSALON PERLES) 100 MG capsule Take 1 capsule (100 mg total) by mouth at bedtime as needed for cough. 30 capsule 0   Biotin 5 MG CAPS Take 5 mg by mouth daily.     calcium carbonate (OSCAL) 1500 (600 Ca) MG TABS tablet Take 600 mg of elemental calcium by mouth daily.     cholecalciferol (VITAMIN D3) 25 MCG (1000 UNIT) tablet Take 3,000 Units  by mouth daily.     Cyanocobalamin (VITAMIN B 12) 100 MCG LOZG Take 1 tablet by mouth daily.     diltiazem (CARDIZEM) 30 MG tablet Take 1 tablet (30 mg total) by mouth 2 (two) times daily. 200 tablet 2   dorzolamide-timolol (COSOPT) 22.3-6.8 MG/ML ophthalmic solution Apply 1 drop to eye in the morning and at bedtime.     Evolocumab (REPATHA SURECLICK) 140 MG/ML SOAJ Inject 140 mg into the skin every 14 (fourteen) days. 2 mL 11   fluticasone (FLONASE) 50 MCG/ACT nasal spray Place 1 spray into both nostrils  daily. 16 g 0   guaiFENesin (MUCINEX) 600 MG 12 hr tablet Take 1 tablet (600 mg total) by mouth 2 (two) times daily as needed. 60 tablet 1   hydrochlorothiazide (HYDRODIURIL) 25 MG tablet Take 1 tablet (25 mg total) by mouth daily. 90 tablet 2   ibuprofen (ADVIL) 200 MG tablet Take 200-400 mg by mouth every 6 (six) hours as needed for moderate pain.     latanoprost (XALATAN) 0.005 % ophthalmic solution Place 1 drop into both eyes at bedtime.      loratadine (CLARITIN REDITABS) 10 MG dissolvable tablet Take 10 mg by mouth daily as needed for allergies.     metoprolol tartrate (LOPRESSOR) 100 MG tablet Take one tablet two hours prior to cardiac CTA. 1 tablet 0   Multiple Vitamins-Minerals (CENTRUM SILVER 50+WOMEN PO) Take 1 tablet by mouth daily.     omeprazole (PRILOSEC) 20 MG capsule Take 20 mg by mouth 2 (two) times daily before a meal.     polyethylene glycol (MIRALAX / GLYCOLAX) packet Take 17 g by mouth daily as needed for moderate constipation.     Probiotic Product (ALIGN PO) Take 1 capsule by mouth daily.     RHOPRESSA 0.02 % SOLN Place 1 drop into both eyes every evening.     vitamin E 180 MG (400 UNITS) capsule Take 400 Units by mouth daily.     No facility-administered medications prior to visit.      Review of Systems  Review of Systems   Physical Exam  LMP  (LMP Unknown)  Physical Exam   Lab Results:  CBC    Component Value Date/Time   WBC 10.0 08/16/2022 1907   RBC 4.26 08/16/2022 1907   HGB 12.5 08/16/2022 1907   HGB 12.9 07/22/2022 1056   HCT 38.6 08/16/2022 1907   HCT 39.9 07/22/2022 1056   PLT 260 08/16/2022 1907   PLT 251 07/22/2022 1056   MCV 90.6 08/16/2022 1907   MCV 92 07/22/2022 1056   MCH 29.3 08/16/2022 1907   MCHC 32.4 08/16/2022 1907   RDW 12.6 08/16/2022 1907   RDW 12.3 07/22/2022 1056   LYMPHSABS 1.7 02/15/2020 1045   MONOABS 0.5 02/15/2020 1045   EOSABS 0.2 02/15/2020 1045   BASOSABS 0.0 02/15/2020 1045    BMET    Component Value  Date/Time   NA 134 (L) 08/16/2022 1907   NA 140 07/22/2022 1056   K 3.3 (L) 08/16/2022 1907   CL 95 (L) 08/16/2022 1907   CO2 29 08/16/2022 1907   GLUCOSE 99 08/16/2022 1907   BUN 15 08/16/2022 1907   BUN 21 07/22/2022 1056   CREATININE 0.80 08/16/2022 1907   CALCIUM 9.6 08/16/2022 1907   GFRNONAA >60 08/16/2022 1907   GFRAA 83 03/01/2020 1106    BNP No results found for: "BNP"  ProBNP No results found for: "PROBNP"  Imaging: No results found.   Assessment &  Plan:   No problem-specific Assessment & Plan notes found for this encounter.     Glenford Bayley, NP 10/14/2022

## 2022-10-15 ENCOUNTER — Encounter: Payer: Self-pay | Admitting: Primary Care

## 2022-10-15 ENCOUNTER — Ambulatory Visit: Payer: Medicare Other | Admitting: Primary Care

## 2022-10-15 VITALS — BP 122/70 | HR 85 | Temp 98.2°F | Ht 63.0 in | Wt 152.8 lb

## 2022-10-15 DIAGNOSIS — J984 Other disorders of lung: Secondary | ICD-10-CM | POA: Diagnosis not present

## 2022-10-15 DIAGNOSIS — M1712 Unilateral primary osteoarthritis, left knee: Secondary | ICD-10-CM | POA: Diagnosis not present

## 2022-10-15 DIAGNOSIS — J849 Interstitial pulmonary disease, unspecified: Secondary | ICD-10-CM

## 2022-10-15 DIAGNOSIS — K219 Gastro-esophageal reflux disease without esophagitis: Secondary | ICD-10-CM

## 2022-10-15 DIAGNOSIS — G4733 Obstructive sleep apnea (adult) (pediatric): Secondary | ICD-10-CM

## 2022-10-15 DIAGNOSIS — M1990 Unspecified osteoarthritis, unspecified site: Secondary | ICD-10-CM | POA: Insufficient documentation

## 2022-10-15 DIAGNOSIS — M170 Bilateral primary osteoarthritis of knee: Secondary | ICD-10-CM | POA: Diagnosis not present

## 2022-10-15 MED ORDER — BENZONATATE 100 MG PO CAPS: 100.0000 mg | ORAL_CAPSULE | Freq: Three times a day (TID) | ORAL | 1 refills | Status: DC | PRN

## 2022-10-15 NOTE — Assessment & Plan Note (Signed)
Refer to rheumatology  

## 2022-10-15 NOTE — Assessment & Plan Note (Signed)
-   Continue CPAP nightly. Advised patient reach out to Dr. Vickey Huger about issues with CPAP

## 2022-10-15 NOTE — Patient Instructions (Addendum)
Pulmonary function testing today showed mild restriction in lung function due to post-covid ILD  Use incentive spirometer 2-3 times a day to encourage deep breathing exercise   Orders: Incentive spirometer   Refer: Rheumatology re: osteoarthritis   Follow-up: 6 months with Dr. Isaiah Serge or sooner if needed

## 2022-10-15 NOTE — Assessment & Plan Note (Addendum)
-   Chronic cough. Dx covid-19 in January 2021. CT with mild reticular changes at bases consistent with post-COVID ILD, this has remained stable. Pulmonary function testing today showed mild restriction in lung function with normal diffusion capacity. Does not look like she has had serology work up except for negative ANA. Continue tessalon Perles TID prn cough. Advised patient use incentive spirometer 2-3 times a day to encourage deep breathing exercises. We will repeat HRCT in 1 year to assess for interval change (due March 2025). FU in 6 months with Dr. Isaiah Serge or sooner if needed.

## 2022-10-15 NOTE — Assessment & Plan Note (Signed)
Continue reflux medication 

## 2022-10-28 DIAGNOSIS — G4733 Obstructive sleep apnea (adult) (pediatric): Secondary | ICD-10-CM | POA: Diagnosis not present

## 2022-10-30 DIAGNOSIS — M170 Bilateral primary osteoarthritis of knee: Secondary | ICD-10-CM | POA: Diagnosis not present

## 2022-11-12 NOTE — Progress Notes (Unsigned)
Office Visit Note  Patient: Terri Barajas             Date of Birth: 1946-05-31           MRN: [id number]             PCP: Terri Felts, FNP Referring: Terri Bayley, NP Visit Date: 11/13/2022 Occupation: @GUAROCC @  Subjective:  Pain in both knees   History of Present Illness: Terri Barajas is a 77 y.o. female who presents today for a new patient consultation as requested by her pulmonology team.  Patient was diagnosed with post-COVID ILD after the initial infection in January 2021.  She continues to have a chronic cough and uses [employer] as needed.  She follows up at [facility] pulmonary every 6 months and is planning to have repeat high-resolution chest CTs on a yearly basis. Patient has been under the care of [facility] for management of chronic pain in the left knee joint.  According to the patient about 2 years ago she was found to have a meniscal tear in the left knee.  She underwent arthroscopic surgery but has had persistent pain and swelling in the left knee since then.  She has tried both cortisone injections as well as Visco gel injections in the left knee with no improvement.  She remains under the care of Dr. Linna Caprice and Dr. Aundria Rud at [facility].  She states that starting 1 to 2 months ago she has had increased pain and swelling in the right knee.  She states that she typically goes to the gym on a regular basis and walks on the track as well as does weight training.  She states that her bilateral knee pain has started to limit her mobility and the ability to go to the gym which has been frustrating.  The patient has tried several topical agents, Tylenol, ibuprofen, ice, heat, and Epsom salt baths with only temporary relief.  Patient states that she has a sister who was previously diagnosed with rheumatoid arthritis and another sister who was diagnosed with gout.  She has noticed some changes in her feet but rarely has discomfort.  She denies any other  joint pain or joint swelling.    Activities of Daily Living:  Patient reports morning stiffness for all day.  Patient Reports nocturnal pain.  Difficulty dressing/grooming: Denies Difficulty climbing stairs: Denies Difficulty getting out of chair: Denies Difficulty using hands for taps, buttons, cutlery, and/or writing: Denies  Review of Systems  Constitutional:  Positive for fatigue.  HENT:  Positive for mouth dryness. Negative for mouth sores.   Eyes:  Positive for dryness.  Respiratory:  Positive for cough and shortness of breath. Negative for wheezing.   Cardiovascular:  Negative for chest pain and palpitations.  Gastrointestinal:  Negative for blood in stool, constipation and diarrhea.  Endocrine: Negative for increased urination.  Genitourinary:  Negative for involuntary urination.  Musculoskeletal:  Positive for joint pain, gait problem, joint pain, joint swelling, myalgias, muscle weakness, morning stiffness, muscle tenderness and myalgias.  Skin:  Negative for color change, rash, hair loss and sensitivity to sunlight.  Allergic/Immunologic: Negative for susceptible to infections.  Neurological:  Negative for dizziness and headaches.  Hematological:  Negative for swollen glands.  Psychiatric/Behavioral:  Negative for depressed mood and sleep disturbance. The patient is not nervous/anxious.     PMFS History:  Patient Active Problem List   Diagnosis Date Noted   Prediabetes 07/29/2022   Leg fatigue 07/22/2022  Statin myopathy 07/22/2022   History of arthroscopy of knee 05/14/2022   Globus sensation 01/31/2022   LPRD (laryngopharyngeal reflux disease) 01/31/2022   Tear of medial meniscus of knee 01/08/2022   Colon cancer screening 01/04/2022   Abdominal distention 01/04/2022   Interstitial pulmonary disease (HCC) 12/19/2021   Osteoarthritis of left hip 10/22/2021   Abdominal bloating 09/03/2021   Chronic intermittent hypoxia with obstructive sleep apnea 09/03/2021    Myopathies 03/28/2021   Osteoarthritis of left knee 10/10/2020   Pes anserinus bursitis of left knee 10/10/2020   Trochanteric bursitis of left hip 10/10/2020   Body mass index (BMI) 26.0-26.9, adult 07/06/2020   Disc displacement, lumbar 07/06/2020   OSA on CPAP 01/06/2019   Abnormal CT of the abdomen 10/04/2018   Radiculopathy affecting upper extremity, left 07/25/2018   Hyperlipidemia 05/13/2018   Situational anxiety 04/29/2018   MVP (mitral valve prolapse) 04/29/2018   Sleep-disordered breathing 04/29/2018   Family History of Hyperthermia, malignant 04/23/2018   Barrett's esophagus 03/01/2018   Elevated lipase 02/18/2018   Hiatal hernia 02/18/2018   Constipation 02/18/2018   Bloating 02/18/2018   Aortic atherosclerosis (HCC) 01/23/2018   Gastroesophageal reflux disease without esophagitis    Glaucoma    Essential hypertension     Past Medical History:  Diagnosis Date   Anxiety    Arthritis    Complication of anesthesia    some type of sedation medication made her feel crazy   Dysrhythmia    Family history of adverse reaction to anesthesia    daughter has Malignant Hyperthermia hx -    GERD (gastroesophageal reflux disease)    Glaucoma    H/O: hysterectomy 1978   Heart disease    History of hiatal hernia    Hypertension    MVP (mitral valve prolapse)    Panic attacks    Pre-diabetes    Sciatica     Family History  Problem Relation Age of Onset   Pancreatic cancer Mother    Alcohol abuse Father    Asthma Sister    Hypertension Sister    Arthritis Sister    Arthritis Sister    Rheum arthritis Sister    Diabetes Brother    Arthritis Brother    Hyperlipidemia Brother    Hypertension Brother    Glaucoma Brother    Colon polyps Brother    Prostate cancer Brother    Congenital heart disease Brother    Throat cancer Brother    Alcohol abuse Brother    Healthy Daughter    Healthy Son    Colon cancer Neg Hx    Esophageal cancer Neg Hx    Inflammatory bowel  disease Neg Hx    Liver disease Neg Hx    Rectal cancer Neg Hx    Stomach cancer Neg Hx    Past Surgical History:  Procedure Laterality Date   APPENDECTOMY  1972   BACK SURGERY  07/14/2020   chipped disc   BIOPSY  07/06/2018   Procedure: BIOPSY;  Surgeon: Lemar Lofty., MD;  Location: [facility] ENDOSCOPY;  Service: Gastroenterology;;   BIOPSY  03/04/2022   Procedure: BIOPSY;  Surgeon: Lemar Lofty., MD;  Location: [facility] ENDOSCOPY;  Service: Gastroenterology;;   CATARACT EXTRACTION Bilateral 2013   CESAREAN SECTION  1976 and 1978   COLONOSCOPY     COLONOSCOPY WITH PROPOFOL N/A 03/04/2022   Procedure: COLONOSCOPY WITH PROPOFOL;  Surgeon: Lemar Lofty., MD;  Location: [facility];  Service: Gastroenterology;  Laterality: N/A;   ESOPHAGOGASTRODUODENOSCOPY (  EGD) WITH PROPOFOL N/A 07/06/2018   Procedure: ESOPHAGOGASTRODUODENOSCOPY (EGD) WITH PROPOFOL;  Surgeon: Meridee Score Netty Starring., MD;  Location: [facility] ENDOSCOPY;  Service: Gastroenterology;  Laterality: N/A;   ESOPHAGOGASTRODUODENOSCOPY (EGD) WITH PROPOFOL N/A 03/04/2022   Procedure: ESOPHAGOGASTRODUODENOSCOPY (EGD) WITH PROPOFOL;  Surgeon: Meridee Score Netty Starring., MD;  Location: [facility] ENDOSCOPY;  Service: Gastroenterology;  Laterality: N/A;   HAND SURGERY Bilateral    HEMORRHOID SURGERY  2018   KNEE CARTILAGE SURGERY Left 01/28/2022   PLANTAR FASCIA RELEASE Right    POLYPECTOMY  03/04/2022   Procedure: POLYPECTOMY;  Surgeon: Meridee Score Netty Starring., MD;  Location: [facility] END[facility]COPY;  Service: Gastroenterology;;   S/P Hysterectomy   1987   SAVORY DILATION N/A 03/04/2022   Procedure: Gaspar Bidding DILATION;  Surgeon: Lemar Lofty., MD;  Location: [facility] ENDOSCOPY;  Service: Gastroenterology;  Laterality: N/A;   Social History   Social History Narrative   Not on file   Immunization History  Administered Date(s) Administered   Fluad Quad(high Dose 65+) 03/11/2019, 03/27/2022   Influenza, High Dose Seasonal PF 04/29/2018,  02/26/2021   Influenza-Unspecified 03/16/2020   PFIZER Comirnaty(Gray Top)Covid-19 Tri-Sucrose Vaccine 12/26/2020   PFIZER(Purple Top)SARS-COV-2 Vaccination 09/02/2019, 09/28/2019, 03/27/2020, 04/14/2021   Pfizer Covid-19 Vaccine Bivalent Booster 56yrs & up 04/14/2021   Pneumococcal Conjugate-13 03/11/2019   Pneumococcal Polysaccharide-23 06/09/2017   Tdap 02/10/2021   Unspecified SARS-COV-2 Vaccination 04/13/2022   Zoster Recombinat (Shingrix) 07/12/2021, 07/22/2022     Objective: Vital Signs: BP 111/70 (BP Location: Right Arm, Patient Position: Sitting, Cuff Size: Normal)   Pulse 87   Resp 15   Ht 5' 3.5" (1.613 m)   Wt 152 lb 12.8 oz (69.3 kg)   LMP  (LMP Unknown)   BMI 26.64 kg/m    Physical Exam Vitals and nursing note reviewed.  Constitutional:      Appearance: She is well-developed.  HENT:     Head: Normocephalic and atraumatic.  Eyes:     Conjunctiva/sclera: Conjunctivae normal.  Cardiovascular:     Rate and Rhythm: Normal rate and regular rhythm.     Heart sounds: Normal heart sounds.  Pulmonary:     Effort: Pulmonary effort is normal.     Breath sounds: Normal breath sounds.  Abdominal:     General: Bowel sounds are normal.     Palpations: Abdomen is soft.  Musculoskeletal:     Cervical back: Normal range of motion.  Lymphadenopathy:     Cervical: No cervical adenopathy.  Skin:    General: Skin is warm and dry.     Capillary Refill: Capillary refill takes less than 2 seconds.  Neurological:     Mental Status: She is alert and oriented to person, place, and time.  Psychiatric:        Behavior: Behavior normal.      Musculoskeletal Exam: C-spine has limited range of motion without rotation.  Thoracic and lumbar spine have good range of motion.  Shoulder joints, elbow joints, wrist joints, MCPs, PIPs, DIPs have good range of motion with no synovitis.  PIP and DIP thickening consistent with osteoarthritis of both hands.  Hip joints have good range of motion  with no groin pain.  Moderate effusion noted in the left knee.  Right knee has good range of motion with no warmth or obvious effusion.  Ankle joints have good range of motion with no tenderness or joint swelling.  Mild bunion formation noted bilaterally.  PIP and DIP thickening consistent with osteoarthritis of both feet.  CDAI Exam: CDAI Score: -- Patient Global: --;  Provider Global: -- Swollen: --; Tender: -- Joint Exam 11/13/2022   No joint exam has been documented for this visit   There is currently no information documented on the homunculus. Go to the Rheumatology activity and complete the homunculus joint exam.  Investigation: No additional findings.  Imaging: XR Foot 2 Views Left  Result Date: 11/13/2022 First MTP, PIP and DIP narrowing was noted.  No intertarsal narrowing was noted.  Dorsal spurring was noted.  No tibiotalar or subtalar joint space narrowing was noted.  Posterior calcaneal spur was noted. Impression: These findings are suggestive of osteoarthritis of the foot.  XR Foot 2 Views Right  Result Date: 11/13/2022 First MTP, PIP and DIP narrowing was noted.  No intertarsal, tibiotalar or subtalar joint space narrowing was noted.  A small inferior calcaneal spur was noted. Impression: These findings are suggestive of osteoarthritis of the foot.  XR KNEE 3 VIEW LEFT  Result Date: 11/13/2022 Moderate medial compartment narrowing was noted.  Mild patellofemoral narrowing was noted.  No chondrocalcinosis was noted. Impression: These findings are consistent with moderate osteoarthritis and mild chondromalacia patella.  XR KNEE 3 VIEW RIGHT  Result Date: 11/13/2022 Moderate medial compartment narrowing was noted.  Moderate patellofemoral narrowing was noted.  No chondrocalcinosis was noted. Impression: These findings are consistent with moderate osteoarthritis and moderate chondromalacia patella.   Recent Labs: Lab Results  Component Value Date   WBC 10.0 08/16/2022    HGB 12.5 08/16/2022   PLT 260 08/16/2022   NA 134 (L) 08/16/2022   K 3.3 (L) 08/16/2022   CL 95 (L) 08/16/2022   CO2 29 08/16/2022   GLUCOSE 99 08/16/2022   BUN 15 08/16/2022   CREATININE 0.80 08/16/2022   BILITOT 0.5 08/18/2020   ALKPHOS 83 08/18/2020   AST 13 08/18/2020   ALT 16 08/18/2020   PROT 6.9 08/18/2020   ALBUMIN 4.2 08/18/2020   CALCIUM 9.6 08/16/2022   GFRAA 83 03/01/2020    Speciality Comments: No specialty comments available.  Procedures:  No procedures performed Allergies: Shellfish allergy, Atorvastatin, Atracurium & derivatives, Atropine, Iodine, and Statins   Assessment / Plan:     Visit Diagnoses: Chronic pain of both knees - 12/21/21: ANA negative, dsDNA negative, C3 WNL: Patient presents today with chronic pain in both knee joints, left greater than right.  Her left knee joint pain started about 2 years ago at which time she was under the care of [facility].  She had a x-ray of the left knee along with an MRI at which time she was found to have a meniscal tear.  She underwent arthroscopic surgery for the left knee on 01/28/2022 for meniscal repair.  She has had persistent pain and swelling in the left knee since then.  She has had an inadequate response to both Visco gel injections and cortisone injections in the past.  She has tried both ibuprofen, Tylenol, topical agents, heat, ice, and Epsom salt soaks with minimal relief.  Her right knee joint pain started 1 to 2 months ago with no injury prior to the onset of symptoms. No history of aspiration/synovial analysis performed.  On examination today she has a moderate effusion in the left knee and painful range of motion with both knee joints.   Patient has a known family history of rheumatoid arthritis (sister) and gout (sister). X-rays of both knees were updated today along with the following lab work for further evaluation.  Offered to attempt a left knee joint aspiration and cortisone injection today  but she  would like to hold off until after she has had x-rays and lab results completed.  Plan to attempt an aspiration and cortisone injection at her follow-up visit.  She will follow-up in the office in about 2 weeks for new patient follow-up visit to discuss all results and the next steps.  - Plan: XR KNEE 3 VIEW RIGHT, XR KNEE 3 VIEW LEFT, Rheumatoid factor, Cyclic citrul peptide antibody, IgG, Angiotensin converting enzyme, Uric acid, Sedimentation rate, C-reactive protein  Pes anserinus bursitis of left knee: Not currently active.   Primary osteoarthritis of left knee: Previous X-ray and MRI of left knee ordered at [facility].  Patient has been under the care of Dr. Linna Caprice and Dr. Aundria Rud at [facility].  She underwent arthroscopic surgery on 01/28/2022-meniscal repair.  She has tried both cortisone injections and Visco gel injections in the past with no response.   Moderate effusion noted.  Plan to attempt an aspiration at scheduled follow up.  The following lab work and x-rays updated.   Trochanteric bursitis of left hip: No tenderness palpation currently.  Primary osteoarthritis of left hip: Good range of motion of the left hip joint on examination today.  No groin pain currently.  Interstitial pulmonary disease (HCC) - CT with mild reticular changes at bases consistent with post-COVID ILD, stable. Under care of Dr. Isaiah Serge.  Reviewed Barajas recent high-resolution chest CT from 09/10/2022: Peripheral and basilar predominant subpleural ground-glass, minimal reticulation and traction bronchiolectasis, slightly more organized than 03/07/21.  ANA and double-stranded DNA negative and C3 WNL on 12/21/21. Family history of RA-sister. Plan to obtain the following lab work today to rule out an underlying connective tissue disease.   - Plan: ANA, Anti-scleroderma antibody, RNP Antibody, Sjogrens syndrome-A extractable nuclear antibody, Anti-Smith antibody, Sjogrens syndrome-B extractable nuclear antibody,  Anti-DNA antibody, double-stranded, C3 and C4, COMPLETE METABOLIC PANEL WITH GFR, CBC with Differential/Platelet, CK  Statin myopathy:CK will be checked today. Plan: CK  Other medical conditions are listed as follows:  Aortic atherosclerosis (HCC)  Essential hypertension: Blood pressure was 111/70 today in the office.  MVP (mitral valve prolapse)  Hiatal hernia  LPRD (laryngopharyngeal reflux disease)  OSA on CPAP  Prediabetes   History of hyperlipidemia    Orders: Orders Placed This Encounter  Procedures   XR KNEE 3 VIEW RIGHT   XR KNEE 3 VIEW LEFT   XR Foot 2 Views Left   XR Foot 2 Views Right   Rheumatoid factor   Cyclic citrul peptide antibody, IgG   Angiotensin converting enzyme   Uric acid   Sedimentation rate   C-reactive protein   ANA   Anti-scleroderma antibody   RNP Antibody   Sjogrens syndrome-A extractable nuclear antibody   Anti-Smith antibody   Sjogrens syndrome-B extractable nuclear antibody   Anti-DNA antibody, double-stranded   C3 and C4   COMPLETE METABOLIC PANEL WITH GFR   CBC with Differential/Platelet   CK   No orders of the defined types were placed in this encounter.    Follow-Up Instructions: Return for [facility].   Gearldine Bienenstock, PA-C  Note - This record has been created using Dragon software.  Chart creation errors have been sought, but may not always  have been located. Such creation errors do not reflect on  the standard of medical care.

## 2022-11-13 ENCOUNTER — Encounter: Payer: Self-pay | Admitting: Physician Assistant

## 2022-11-13 ENCOUNTER — Ambulatory Visit: Payer: Medicare Other

## 2022-11-13 ENCOUNTER — Ambulatory Visit: Payer: Medicare Other | Attending: Physician Assistant | Admitting: Physician Assistant

## 2022-11-13 ENCOUNTER — Ambulatory Visit (INDEPENDENT_AMBULATORY_CARE_PROVIDER_SITE_OTHER): Payer: Medicare Other

## 2022-11-13 VITALS — BP 111/70 | HR 87 | Resp 15 | Ht 63.5 in | Wt 152.8 lb

## 2022-11-13 DIAGNOSIS — K219 Gastro-esophageal reflux disease without esophagitis: Secondary | ICD-10-CM | POA: Diagnosis not present

## 2022-11-13 DIAGNOSIS — Z8639 Personal history of other endocrine, nutritional and metabolic disease: Secondary | ICD-10-CM

## 2022-11-13 DIAGNOSIS — M79672 Pain in left foot: Secondary | ICD-10-CM | POA: Diagnosis not present

## 2022-11-13 DIAGNOSIS — M7062 Trochanteric bursitis, left hip: Secondary | ICD-10-CM | POA: Diagnosis not present

## 2022-11-13 DIAGNOSIS — I341 Nonrheumatic mitral (valve) prolapse: Secondary | ICD-10-CM

## 2022-11-13 DIAGNOSIS — J849 Interstitial pulmonary disease, unspecified: Secondary | ICD-10-CM | POA: Diagnosis not present

## 2022-11-13 DIAGNOSIS — Z8261 Family history of arthritis: Secondary | ICD-10-CM

## 2022-11-13 DIAGNOSIS — G8929 Other chronic pain: Secondary | ICD-10-CM | POA: Diagnosis not present

## 2022-11-13 DIAGNOSIS — K449 Diaphragmatic hernia without obstruction or gangrene: Secondary | ICD-10-CM

## 2022-11-13 DIAGNOSIS — I10 Essential (primary) hypertension: Secondary | ICD-10-CM

## 2022-11-13 DIAGNOSIS — M25561 Pain in right knee: Secondary | ICD-10-CM

## 2022-11-13 DIAGNOSIS — M1612 Unilateral primary osteoarthritis, left hip: Secondary | ICD-10-CM | POA: Diagnosis not present

## 2022-11-13 DIAGNOSIS — R7303 Prediabetes: Secondary | ICD-10-CM | POA: Diagnosis not present

## 2022-11-13 DIAGNOSIS — I700 Atherosclerosis of aorta: Secondary | ICD-10-CM

## 2022-11-13 DIAGNOSIS — M1712 Unilateral primary osteoarthritis, left knee: Secondary | ICD-10-CM

## 2022-11-13 DIAGNOSIS — M7052 Other bursitis of knee, left knee: Secondary | ICD-10-CM | POA: Diagnosis not present

## 2022-11-13 DIAGNOSIS — M79671 Pain in right foot: Secondary | ICD-10-CM

## 2022-11-13 DIAGNOSIS — G4733 Obstructive sleep apnea (adult) (pediatric): Secondary | ICD-10-CM | POA: Diagnosis not present

## 2022-11-13 DIAGNOSIS — M25562 Pain in left knee: Secondary | ICD-10-CM | POA: Diagnosis not present

## 2022-11-13 DIAGNOSIS — G720 Drug-induced myopathy: Secondary | ICD-10-CM

## 2022-11-13 DIAGNOSIS — T466X5A Adverse effect of antihyperlipidemic and antiarteriosclerotic drugs, initial encounter: Secondary | ICD-10-CM

## 2022-11-13 LAB — CBC WITH DIFFERENTIAL/PLATELET
Neutro Abs: 4817 cells/uL (ref 1500–7800)
Platelets: 262 10*3/uL (ref 140–400)
RBC: 4.32 10*6/uL (ref 3.80–5.10)
Total Lymphocyte: 23.5 %

## 2022-11-14 ENCOUNTER — Telehealth: Payer: Self-pay

## 2022-11-14 LAB — C3 AND C4
C3 Complement: 148 mg/dL (ref 83–193)
C4 Complement: 38 mg/dL (ref 15–57)

## 2022-11-14 LAB — CBC WITH DIFFERENTIAL/PLATELET
Eosinophils Relative: 3.4 %
Hemoglobin: 12.8 g/dL (ref 11.7–15.5)
MCHC: 32.2 g/dL (ref 32.0–36.0)
MCV: 91.9 fL (ref 80.0–100.0)
MPV: 11.8 fL (ref 7.5–12.5)
Monocytes Relative: 7.5 %
WBC: 7.4 10*3/uL (ref 3.8–10.8)

## 2022-11-14 LAB — COMPLETE METABOLIC PANEL WITH GFR
Alkaline phosphatase (APISO): 87 U/L (ref 37–153)
BUN: 21 mg/dL (ref 7–25)
CO2: 32 mmol/L (ref 20–32)
Calcium: 10 mg/dL (ref 8.6–10.4)
Creat: 0.8 mg/dL (ref 0.60–1.00)
Glucose, Bld: 71 mg/dL (ref 65–99)
Total Protein: 7.2 g/dL (ref 6.1–8.1)

## 2022-11-14 NOTE — Progress Notes (Signed)
Care Management & Coordination Services Pharmacy Team  Reason for Encounter: Appointment Reminder  Contacted patient to confirm telephone appointment with Cherylin Mylar, PharmD on 11-19-2022 at 9:00. Spoke with patient on 11/14/2022   Do you have any problems getting your medications? No  What is your top health concern you would like to discuss at your upcoming visit? Patient stated issues with arthritis.  Have you seen any other providers since your last visit with PCP? Yes   Chart review: Recent office visits:  None  Recent consult visits:  11-13-2022 Rachel Bo (Rheumatology). Follow up visit for chronic pain of both knees and feet.  10-30-2022 Kenna Gilbert, MD ([facility]). Follow up visit for left knee pain.  10-15-2022 Glenford Bayley, NP (Pulmonary). Follow up visit for chronic cough. Increase benzonatate 100 mg at bedtime PRN to 100 mg TID PRN.  10-02-2022 Chilton Greathouse, MD (Pulmonary). Visit for pulmonary function test.  Hospital visits:  Medication Reconciliation was completed by comparing discharge summary, patient's EMR and Pharmacy list, and upon discussion with patient.   Admitted to the hospital on 08-16-2022 due to Atypical chest pain . Discharge date was 08-16-2022. Discharged from [facility] at [facility].     New?Medications Started at [facility] Discharge:?? None   Medication Changes at Hospital Discharge: None   Medications Discontinued at Hospital Discharge: None   Medications that remain the same after Hospital Discharge:??  -All other medications will remain the same.    Star Rating Drugs:  None  Care Gaps: Annual wellness visit in last year? Yes Covid booster overdue  Huey Romans [facility] Clinical Pharmacist Assistant [phone]

## 2022-11-15 LAB — COMPLETE METABOLIC PANEL WITH GFR
AG Ratio: 1.5 (calc) (ref 1.0–2.5)
ALT: 16 U/L (ref 6–29)
AST: 16 U/L (ref 10–35)
Albumin: 4.3 g/dL (ref 3.6–5.1)
Chloride: 100 mmol/L (ref 98–110)
Globulin: 2.9 g/dL (calc) (ref 1.9–3.7)
Potassium: 3.8 mmol/L (ref 3.5–5.3)
Sodium: 140 mmol/L (ref 135–146)
Total Bilirubin: 0.4 mg/dL (ref 0.2–1.2)
eGFR: 77 mL/min/{1.73_m2} (ref >=60)

## 2022-11-15 LAB — CBC WITH DIFFERENTIAL/PLATELET
Absolute Monocytes: 555 cells/uL (ref 200–950)
Basophils Absolute: 37 cells/uL (ref 0–200)
Basophils Relative: 0.5 %
Eosinophils Absolute: 252 cells/uL (ref 15–500)
HCT: 39.7 % (ref 35.0–45.0)
Lymphs Abs: 1739 cells/uL (ref 850–3900)
MCH: 29.6 pg (ref 27.0–33.0)
Neutrophils Relative %: 65.1 %
RDW: 11.8 % (ref 11.0–15.0)

## 2022-11-15 LAB — CK: Total CK: 97 U/L (ref 29–143)

## 2022-11-15 LAB — ANGIOTENSIN CONVERTING ENZYME: Angiotensin-Converting Enzyme: 33 U/L (ref 9–67)

## 2022-11-15 LAB — ANA: Anti Nuclear Antibody (ANA): NEGATIVE

## 2022-11-15 LAB — SJOGRENS SYNDROME-A EXTRACTABLE NUCLEAR ANTIBODY: SSA (Ro) (ENA) Antibody, IgG: <1 AI

## 2022-11-15 LAB — SEDIMENTATION RATE: Sed Rate: 17 mm/h (ref 0–30)

## 2022-11-15 LAB — C-REACTIVE PROTEIN: CRP: <3 mg/L (ref <8.0)

## 2022-11-15 LAB — SJOGRENS SYNDROME-B EXTRACTABLE NUCLEAR ANTIBODY: SSB (La) (ENA) Antibody, IgG: <1 AI

## 2022-11-15 LAB — ANTI-SCLERODERMA ANTIBODY: Scleroderma (Scl-70) (ENA) Antibody, IgG: <1 AI

## 2022-11-15 LAB — URIC ACID: Uric Acid, Serum: 5.6 mg/dL (ref 2.5–7.0)

## 2022-11-15 LAB — ANTI-SMITH ANTIBODY: ENA SM Ab Ser-aCnc: <1 AI

## 2022-11-15 LAB — ANTI-DNA ANTIBODY, DOUBLE-STRANDED: ds DNA Ab: <1 IU/mL

## 2022-11-15 LAB — CYCLIC CITRUL PEPTIDE ANTIBODY, IGG: Cyclic Citrullin Peptide Ab: <16 UNITS

## 2022-11-15 LAB — RNP ANTIBODY: Ribonucleic Protein(ENA) Antibody, IgG: <1 AI

## 2022-11-15 LAB — RHEUMATOID FACTOR: Rheumatoid fact SerPl-aCnc: <10 IU/mL (ref <14)

## 2022-11-18 NOTE — Progress Notes (Signed)
Labs will be discussed at [facility]

## 2022-11-19 ENCOUNTER — Ambulatory Visit: Payer: Medicare Other

## 2022-11-19 NOTE — Progress Notes (Signed)
Care Management & Coordination Services Pharmacy Note  11/19/2022 Name:  Terri Barajas MRN:  [id number] DOB:  October 07, 1945  Summary: Terri Barajas is going to focus on eating healthy and continue current GERD regimen of Omeprazole 20 mg capsule twice per day.   Recommendations/Changes made from today's visit: Recommend Terri Barajas avoid spicy foods and carbonated beverages.   Follow up plan: Goal to continue to limit carbonated beverages and spicy foods for the next 4 months, by only have a carbonated beverage no more than twice per month .  Recommend patient continue current medication regimen  Maintin follow up with specialist and PCP.    Subjective: Terri Barajas is an 77 y.o. year old female who is a primary patient of Arnette Felts, FNP.  The care coordination team was consulted for assistance with disease management and care coordination needs.    Engaged with patient by telephone for follow up visit.  Recent office visits:  None   Recent consult visits:  11-13-2022 Rachel Bo (Rheumatology). Follow up visit for chronic pain of both knees and feet.   10-30-2022 Kenna Gilbert, MD ([facility]). Follow up visit for left knee pain.   10-15-2022 Glenford Bayley, NP (Pulmonary). Follow up visit for chronic cough. Increase benzonatate 100 mg at bedtime PRN to 100 mg TID PRN.   10-02-2022 Chilton Greathouse, MD (Pulmonary). Visit for pulmonary function test.   Hospital visits:  Medication Reconciliation was completed by comparing discharge summary, patient's EMR and Pharmacy list, and upon discussion with patient.   Admitted to the hospital on 08-16-2022 due to Atypical chest pain . Discharge date was 08-16-2022. [facility] from [facility] health at [facility].     New?Medications Started at [facility] Discharge:?? None   Medication Changes at Hospital Discharge: None   Medications Discontinued at Hospital Discharge: None   Objective:  Lab  Results  Component Value Date   CREATININE 0.80 11/13/2022   BUN 21 11/13/2022   GFR 90.57 08/03/2019   EGFR 77 11/13/2022   GFRNONAA >60 08/16/2022   GFRAA 83 03/01/2020   NA 140 11/13/2022   K 3.8 11/13/2022   CALCIUM 10.0 11/13/2022   CO2 32 11/13/2022   GLUCOSE 71 11/13/2022    Lab Results  Component Value Date/Time   HGBA1C 6.3 (H) 12/19/2021 11:09 AM   HGBA1C 6.1 (H) 02/07/2021 12:17 PM   GFR 90.57 08/03/2019 01:40 PM   GFR 66.53 05/27/2018 10:45 AM   MICROALBUR 30 08/18/2020 11:21 AM    Last diabetic Eye exam:  Lab Results  Component Value Date/Time   HMDIABEYEEXA No Retinopathy 09/24/2021 12:00 AM    Last diabetic Foot exam: No results found for: "HMDIABFOOTEX"   Lab Results  Component Value Date   CHOL 163 07/22/2022   HDL 63 07/22/2022   LDLCALC 83 07/22/2022   TRIG 94 07/22/2022   CHOLHDL 2.6 07/22/2022       Latest Ref Rng & Units 11/13/2022   11:51 AM 08/18/2020   12:38 PM 08/03/2019    1:40 PM  Hepatic Function  Total Protein 6.1 - 8.1 g/dL 7.2  6.9  7.1   Albumin 3.7 - 4.7 g/dL  4.2  4.0   AST 10 - 35 U/L 16  13  17    ALT 6 - 29 U/L 16  16  13    Alk Phosphatase 44 - 121 IU/L  83  79   Total Bilirubin 0.2 - 1.2 mg/dL 0.4  0.5  0.5  Lab Results  Component Value Date/Time   TSH 1.22 08/03/2019 01:40 PM   TSH 1.21 05/27/2018 10:45 AM   FREET4 0.74 05/27/2018 10:45 AM       Latest Ref Rng & Units 11/13/2022   11:51 AM 08/16/2022    7:07 PM 07/22/2022   10:56 AM  CBC  WBC 3.8 - 10.8 Thousand/uL 7.4  10.0  5.5   Hemoglobin 11.7 - 15.5 g/dL 16.1  09.6  04.5   Hematocrit 35.0 - 45.0 % 39.7  38.6  39.9   Platelets 140 - 400 Thousand/uL 262  260  251     Lab Results  Component Value Date/Time   VD25OH 73.0 12/19/2021 11:09 AM   VD25OH 92.9 08/18/2020 12:38 PM   VITAMINB12 882 07/22/2022 10:56 AM    Clinical ASCVD: Yes  The 10-year ASCVD risk score (Arnett DK, et al., 2019) is: 24.4%   Values used to calculate the score:     Age: 36  years     Sex: Female     Is Non-Hispanic African American: Yes     Diabetic: Yes     Tobacco smoker: No     Systolic Blood Pressure: 111 mmHg     Is BP treated: Yes     HDL Cholesterol: 63 mg/dL     Total Cholesterol: 163 mg/dL       4/0/9811   91:47 AM 05/02/2022    8:24 AM 01/15/2022    3:42 PM  Depression screen PHQ 2/9  Decreased Interest 0 0 1  Down, Depressed, Hopeless 0 0 1  PHQ - 2 Score 0 0 2  Altered sleeping 0  1  Tired, decreased energy 0  1  Change in appetite 0  1  Feeling bad or failure about yourself  0  0  Trouble concentrating 0  0  Moving slowly or fidgety/restless 0  0  Suicidal thoughts 0  0  PHQ-9 Score 0  5     Social History   Tobacco Use  Smoking Status Never   Passive exposure: Never  Smokeless Tobacco Never   BP Readings from Last 3 Encounters:  11/13/22 111/70  10/15/22 122/70  09/17/22 117/71   Pulse Readings from Last 3 Encounters:  11/13/22 87  10/15/22 85  09/17/22 85   Wt Readings from Last 3 Encounters:  11/13/22 152 lb 12.8 oz (69.3 kg)  10/15/22 152 lb 12.8 oz (69.3 kg)  09/17/22 151 lb (68.5 kg)   BMI Readings from Last 3 Encounters:  11/13/22 26.64 kg/m  10/15/22 27.07 kg/m  09/17/22 26.75 kg/m    Allergies  Allergen Reactions   Shellfish Allergy Anaphylaxis and Shortness Of Breath   Atorvastatin     Myalgias    Atracurium & Derivatives Other (See Comments)    Unknown   Atropine Other (See Comments)    Heart racing   Iodine Other (See Comments)    Unknown   Statins     "didn't feel right"     Medications Reviewed Today     Reviewed by Rachel Bo (Physician Assistant Certified) on 11/13/22 at 1121  Med List Status: <None>   Medication Order Taking? Sig Documenting Provider Last Dose Status Informant  acetaminophen (TYLENOL) 500 MG tablet [id number] Yes Take 500-1,000 mg by mouth every 6 (six) hours as needed for moderate pain. [provider] Taking Active Self  ALPRAZolam (XANAX)  0.25 MG tablet [id number] Yes Take 1 tablet (0.25 mg total) by mouth daily as needed  for anxiety. Arnette Felts, FNP Taking Active Self           Med Note Benny Lennert Mar 04, 2022  6:44 AM) 1/2 tab  aluminum-magnesium hydroxide 200-200 MG/5ML suspension [id number] Yes Take 5 mLs by mouth every 6 (six) hours as needed for indigestion. [provider] Taking Active Self  ascorbic acid (VITAMIN C) 500 MG tablet [id number] Yes Take 500 mg by mouth daily. [provider] Taking Active Self  aspirin 81 MG chewable tablet [id number] Yes Chew 81 mg by mouth daily. [provider] Taking Active Self  benzonatate (TESSALON PERLES) 100 MG capsule [id number] Yes Take 1 capsule (100 mg total) by mouth 3 (three) times daily as needed for cough. Glenford Bayley, NP Taking Active   Biotin 5 MG CAPS [id number] Yes Take 5 mg by mouth daily. [provider] Taking Active Self  calcium carbonate (OSCAL) 1500 (600 Ca) MG TABS tablet [id number] Yes Take 600 mg of elemental calcium by mouth daily. [provider] Taking Active Self           Med Note Joella Prince A   Wed Feb 27, 2022  4:04 PM) On hold due to difficulty swallowing, since august   cholecalciferol (VITAMIN D3) 25 MCG (1000 UNIT) tablet [id number] Yes Take 3,000 Units by mouth daily. [provider] Taking Active Self  Cyanocobalamin (VITAMIN B 12) 100 MCG LOZG [id number] Yes Take 1 tablet by mouth daily. [provider] Taking Active   diltiazem (CARDIZEM) 30 MG tablet [id number] Yes Take 1 tablet (30 mg total) by mouth 2 (two) times daily. Chilton Si, MD Taking Active   dorzolamide-timolol (COSOPT) 22.3-6.8 MG/ML ophthalmic solution [id number] Yes Apply 1 drop to eye in the morning and at bedtime. [provider] Taking Active Self  Evolocumab (REPATHA SURECLICK) 140 MG/ML Ivory Broad [id number] Yes Inject 140 mg into the skin every 14 (fourteen) days. Chilton Si, MD Taking  Active Self  fluticasone [facility]) 50 MCG/ACT nasal spray [id number] Yes Place 1 spray into both nostrils daily. Raspet, Noberto Retort, PA-C Taking Active   guaiFENesin (MUCINEX) 600 MG 12 hr tablet [id number] Yes Take 1 tablet (600 mg total) by mouth 2 (two) times daily as needed. Dohmeier, Porfirio Mylar, MD Taking Active   hydrochlorothiazide (HYDRODIURIL) 25 MG tablet [id number] Yes Take 1 tablet (25 mg total) by mouth daily. Arnette Felts, FNP Taking Active Self  ibuprofen (ADVIL) 200 MG tablet [id number] Yes Take 200-400 mg by mouth every 6 (six) hours as needed for moderate pain. [provider] Taking Active Self  latanoprost (XALATAN) 0.005 % ophthalmic solution [id number] Yes Place 1 drop into both eyes at bedtime.  [provider] Taking Active Self  loratadine (CLARITIN REDITABS) 10 MG dissolvable tablet [id number] Yes Take 10 mg by mouth daily as needed for allergies. [provider] Taking Active Self  metoprolol tartrate (LOPRESSOR) 100 MG tablet [id number] No Take one tablet two hours prior to cardiac CTA.  Patient not taking: Reported on 11/13/2022   Flossie Dibble, NP Not Taking Active   Multiple Vitamins-Minerals (CENTRUM SILVER 50+WOMEN PO) [id number] Yes Take 1 tablet by mouth daily. [provider] Taking Active Self           Med Note Luiz Blare, MARISSA C   Wed Nov 13, 2022 10:53 AM)    omeprazole (PRILOSEC) 20 MG capsule [id number] Yes Take 20 mg by mouth 2 (two) times daily before a meal. [provider] Taking Active Self  polyethylene  glycol (MIRALAX / GLYCOLAX) packet [id number] Yes Take 17 g by mouth daily as needed for moderate constipation. [provider] Taking Active Self  Probiotic Product (ALIGN PO) [id number] Yes Take 1 capsule by mouth daily. [provider] Taking Active Self  RHOPRESSA 0.02 % SOLN [id number] Yes Place 1 drop into both eyes every evening. [provider] Taking Active Self  vitamin E 180 MG (400  UNITS) capsule [id number] Yes Take 400 Units by mouth daily. [provider] Taking Active Self            SDOH:  (Social Determinants of Health) assessments and interventions performed: No SDOH Interventions    Flowsheet Row Telephone from 08/19/2022 in [facility] Community Care Coordination Clinical Support from 05/02/2022 in [facility] Chronic Care Management from 05/26/2020 in [facility]  SDOH Interventions     Food Insecurity Interventions Intervention Not Indicated Intervention Not Indicated Intervention Not Indicated  Housing Interventions -- -- Intervention Not Indicated  Transportation Interventions Intervention Not Indicated Intervention Not Indicated Intervention Not Indicated  Financial Strain Interventions -- Intervention Not Indicated --  Physical Activity Interventions -- Intervention Not Indicated --  Stress Interventions -- Intervention Not Indicated --       Medication Assistance:  Repatha obtained through [employer] medication assistance program.  Enrollment ends 05/2023  Medication Access: Name and location of current pharmacy:  [id number] [place], [place] - [address] [place] [address] Phone: [phone] Fax: [phone]  [facility]) - [place], [place] - [address] [facility] [address] [place] [address] [place] [address] Phone: [phone] Fax: [phone]  [facility] - [place], [place] - [address] [place] [address] Phone: [phone] Fax: [phone]  Within the past 30 days, how often has patient missed a dose of medication? No Is a pillbox or other method used to improve adherence? Yes  Factors that may affect medication adherence? lack of understanding of disease management Are meds synced by current pharmacy? No  Are  meds delivered by current pharmacy? No  Does patient experience delays in picking up medications due to transportation concerns? No   Compliance/Adherence/Medication fill history: Care Gaps: Annual wellness visit in last year? Yes Covid booster overdue  Star-Rating Drugs: None    Assessment/Plan   GERD (Goal: reduce symptoms of GERD) -Controlled -Current treatment  Omeprazole 20 mg capsule - taking 1 capsule two times daily before a meal Appropriate, Effective, Safe, Accessible -Avoiding spicy foods and carbonated beverages  -Making sure bed is inclined at night -Avoid vigorous exercise for a couple of hours after eating. An after-dinner stroll is fine, but a more strenuous workout, especially if it involves bending over, can send acid into your esophagus. -Recommended to continue current medication and follow lifestyle changes   Cherylin Mylar, CPP, PharmD Clinical Pharmacist Practitioner [facility] [phone]

## 2022-11-20 DIAGNOSIS — M25562 Pain in left knee: Secondary | ICD-10-CM | POA: Diagnosis not present

## 2022-11-22 NOTE — Progress Notes (Unsigned)
Office Visit Note  Patient: Terri Barajas             Date of Birth: 29-Jan-1946           MRN: [id number]             PCP: Arnette Felts, FNP Referring: Arnette Felts, FNP Visit Date: 11/26/2022 Occupation: @GUAROCC @  Subjective:  Discuss results   History of Present Illness: Terri Barajas is a 77 y.o. female with history of osteoarthritis.  Patient presents today to discuss lab results and x-ray results from her initial office visit.  She continues to have persistent pain in both knee joints especially the left knee.  She has been trying to keep up with her exercise regimen per usual.  She denies any new concerns since her initial office visit.  Activities of Daily Living:  Patient reports morning stiffness for 5-10 minutes.   Patient Reports nocturnal pain.  Difficulty dressing/grooming: Denies Difficulty climbing stairs: Reports Difficulty getting out of chair: Denies Difficulty using hands for taps, buttons, cutlery, and/or writing: Denies  Review of Systems  Constitutional:  Positive for fatigue.  HENT:  Positive for mouth dryness. Negative for mouth sores.   Eyes:  Positive for photophobia and dryness.  Respiratory:  Negative for shortness of breath.   Cardiovascular:  Negative for chest pain and palpitations.  Gastrointestinal:  Negative for blood in stool, constipation and diarrhea.  Endocrine: Negative for increased urination.  Genitourinary:  Negative for involuntary urination.  Musculoskeletal:  Positive for joint pain, gait problem, joint pain, joint swelling, myalgias, muscle weakness, morning stiffness, muscle tenderness and myalgias.  Skin:  Negative for color change, rash, hair loss and sensitivity to sunlight.  Allergic/Immunologic: Positive for susceptible to infections.  Neurological:  Negative for dizziness and headaches.  Hematological:  Negative for swollen glands.  Psychiatric/Behavioral:  Negative for depressed mood and sleep disturbance. The  patient is not nervous/anxious.     PMFS History:  Patient Active Problem List   Diagnosis Date Noted   Prediabetes 07/29/2022   Leg fatigue 07/22/2022   Statin myopathy 07/22/2022   History of arthroscopy of knee 05/14/2022   Globus sensation 01/31/2022   LPRD (laryngopharyngeal reflux disease) 01/31/2022   Tear of medial meniscus of knee 01/08/2022   Colon cancer screening 01/04/2022   Abdominal distention 01/04/2022   Interstitial pulmonary disease (HCC) 12/19/2021   Osteoarthritis of left hip 10/22/2021   Abdominal bloating 09/03/2021   Chronic intermittent hypoxia with obstructive sleep apnea 09/03/2021   Myopathies 03/28/2021   Osteoarthritis of left knee 10/10/2020   Pes anserinus bursitis of left knee 10/10/2020   Trochanteric bursitis of left hip 10/10/2020   Body mass index (BMI) 26.0-26.9, adult 07/06/2020   Disc displacement, lumbar 07/06/2020   OSA on CPAP 01/06/2019   Abnormal CT of the abdomen 10/04/2018   Radiculopathy affecting upper extremity, left 07/25/2018   Hyperlipidemia 05/13/2018   Situational anxiety 04/29/2018   MVP (mitral valve prolapse) 04/29/2018   Sleep-disordered breathing 04/29/2018   Family History of Hyperthermia, malignant 04/23/2018   Barrett's esophagus 03/01/2018   Elevated lipase 02/18/2018   Hiatal hernia 02/18/2018   Constipation 02/18/2018   Bloating 02/18/2018   Aortic atherosclerosis (HCC) 01/23/2018   Gastroesophageal reflux disease without esophagitis    Glaucoma    Essential hypertension     Past Medical History:  Diagnosis Date   Anxiety    Arthritis    Complication of anesthesia    some type of sedation medication made  her feel crazy   Dysrhythmia    Family history of adverse reaction to anesthesia    daughter has Malignant Hyperthermia hx -    GERD (gastroesophageal reflux disease)    Glaucoma    H/O: hysterectomy 1978   Heart disease    History of hiatal hernia    Hypertension    MVP (mitral valve prolapse)     Panic attacks    Pre-diabetes    Sciatica     Family History  Problem Relation Age of Onset   Pancreatic cancer Mother    Alcohol abuse Father    Asthma Sister    Hypertension Sister    Arthritis Sister    Arthritis Sister    Rheum arthritis Sister    Diabetes Brother    Arthritis Brother    Hyperlipidemia Brother    Hypertension Brother    Glaucoma Brother    Colon polyps Brother    Prostate cancer Brother    Congenital heart disease Brother    Throat cancer Brother    Alcohol abuse Brother    Healthy Daughter    Healthy Son    Colon cancer Neg Hx    Esophageal cancer Neg Hx    Inflammatory bowel disease Neg Hx    Liver disease Neg Hx    Rectal cancer Neg Hx    Stomach cancer Neg Hx    Past Surgical History:  Procedure Laterality Date   APPENDECTOMY  1972   BACK SURGERY  07/14/2020   chipped disc   BIOPSY  07/06/2018   Procedure: BIOPSY;  Surgeon: Lemar Lofty., MD;  Location: [facility] ENDOSCOPY;  Service: Gastroenterology;;   BIOPSY  03/04/2022   Procedure: BIOPSY;  Surgeon: Lemar Lofty., MD;  Location: [facility] ENDOSCOPY;  Service: Gastroenterology;;   CATARACT EXTRACTION Bilateral 2013   CESAREAN SECTION  1976 and 1978   COLONOSCOPY     COLONOSCOPY WITH PROPOFOL N/A 03/04/2022   Procedure: COLONOSCOPY WITH PROPOFOL;  Surgeon: Lemar Lofty., MD;  Location: [facility] ENDOSCOPY;  Service: Gastroenterology;  Laterality: N/A;   ESOPHAGOGASTRODUODENOSCOPY (EGD) WITH PROPOFOL N/A 07/06/2018   Procedure: ESOPHAGOGASTRODUODENOSCOPY (EGD) WITH PROPOFOL;  Surgeon: Meridee Score Netty Starring., MD;  Location[facility];  Service: Gastroenterology;  Laterality: N/A;   ESOPHAGOGASTRODUODENOSCOPY (EGD) WITH PROPOFOL N/A 03/04/2022   Procedure: ESOPHAGOGASTRODUODENOSCOPY (EGD) WITH PROPOFOL;  Surgeon: Meridee Score Netty Starring., MD;  Location: [facility] ENDOSCOPY;  Service: Gastroenterology;  Laterality: N/A;   HAND SURGERY Bilateral    HEMORRHOID SURGERY  2018   KNEE  CARTILAGE SURGERY Left 01/28/2022   PLANTAR FASCIA RELEASE Right    POLYPECTOMY  03/04/2022   Procedure: POLYPECTOMY;  Surgeon: Meridee Score Netty Starring., MD;  Location: [facility] ENDOSCOPY;  Service: Gastroenterology;;   S/P Hysterectomy   1987   SAVORY DILATION N/A 03/04/2022   Procedure: Gaspar Bidding DILATION;  Surgeon: Lemar Lofty., MD;  Location: [facility] ENDOSCOPY;  Service: Gastroenterology;  Laterality: N/A;   Social History   Social History Narrative   Not on file   Immunization History  Administered Date(s) Administered   Fluad Quad(high Dose 65+) 03/11/2019, 03/27/2022   Influenza, High Dose Seasonal PF 04/29/2018, 02/26/2021   Influenza-Unspecified 03/16/2020   PFIZER Comirnaty(Gray Top)Covid-19 Tri-Sucrose Vaccine 12/26/2020   PFIZER(Purple Top)SARS-COV-2 Vaccination 09/02/2019, 09/28/2019, 03/27/2020, 04/14/2021   Pfizer Covid-19 Vaccine Bivalent Booster 47yrs & up 04/14/2021   Pneumococcal Conjugate-13 03/11/2019   Pneumococcal Polysaccharide-23 06/09/2017   Tdap 02/10/2021   Unspecified SARS-COV-2 Vaccination 04/13/2022   Zoster Recombinat (Shingrix) 07/12/2021, 07/22/2022  Objective: Vital Signs: BP 138/80 (BP Location: Left Arm, Patient Position: Sitting, Cuff Size: Normal)   Pulse 76   Resp 13   Ht 5\' 2"  (1.575 m)   Wt 153 lb (69.4 kg)   LMP  (LMP Unknown)   BMI 27.98 kg/m    Physical Exam Vitals and nursing note reviewed.  Constitutional:      Appearance: She is well-developed.  HENT:     Head: Normocephalic and atraumatic.  Eyes:     Conjunctiva/sclera: Conjunctivae normal.  Cardiovascular:     Rate and Rhythm: Normal rate and regular rhythm.     Heart sounds: Normal heart sounds.  Pulmonary:     Effort: Pulmonary effort is normal.     Breath sounds: Normal breath sounds.  Abdominal:     General: Bowel sounds are normal.     Palpations: Abdomen is soft.  Musculoskeletal:     Cervical back: Normal range of motion.  Lymphadenopathy:     Cervical:  No cervical adenopathy.  Skin:    General: Skin is warm and dry.     Capillary Refill: Capillary refill takes less than 2 seconds.  Neurological:     Mental Status: She is alert and oriented to person, place, and time.  Psychiatric:        Behavior: Behavior normal.      Musculoskeletal Exam: C-spine, thoracic spine, lumbar spine good range of motion.  No midline spinal tenderness.  Shoulder joints, elbow joints, wrist joints, MCPs, PIPs, DIPs have good range of motion with no synovitis.  Complete fist formation bilaterally.  PIP and DIP thickening consistent with osteoarthritis of both hands.  Hip joints have good range of motion with no groin pain.  Painful range of motion of the left knee with a small to moderate effusion.  Right knee has good range of motion with a very small effusion.  Ankle joints have good range of motion with no tenderness or joint swelling.  CDAI Exam: CDAI Score: -- Patient Global: --; Provider Global: -- Swollen: --; Tender: -- Joint Exam 11/26/2022   No joint exam has been documented for this visit   There is currently no information documented on the homunculus. Go to the Rheumatology activity and complete the homunculus joint exam.  Investigation: No additional findings.  Imaging: XR Foot 2 Views Left  Result Date: 11/13/2022 First MTP, PIP and DIP narrowing was noted.  No intertarsal narrowing was noted.  Dorsal spurring was noted.  No tibiotalar or subtalar joint space narrowing was noted.  Posterior calcaneal spur was noted. Impression: These findings are suggestive of osteoarthritis of the foot.  XR Foot 2 Views Right  Result Date: 11/13/2022 First MTP, PIP and DIP narrowing was noted.  No intertarsal, tibiotalar or subtalar joint space narrowing was noted.  A small inferior calcaneal spur was noted. Impression: These findings are suggestive of osteoarthritis of the foot.  XR KNEE 3 VIEW LEFT  Result Date: 11/13/2022 Moderate medial compartment  narrowing was noted.  Mild patellofemoral narrowing was noted.  No chondrocalcinosis was noted. Impression: These findings are consistent with moderate osteoarthritis and mild chondromalacia patella.  XR KNEE 3 VIEW RIGHT  Result Date: 11/13/2022 Moderate medial compartment narrowing was noted.  Moderate patellofemoral narrowing was noted.  No chondrocalcinosis was noted. Impression: These findings are consistent with moderate osteoarthritis and moderate chondromalacia patella.   Recent Labs: Lab Results  Component Value Date   WBC 7.4 11/13/2022   HGB 12.8 11/13/2022   PLT 262 11/13/2022  NA 140 11/13/2022   K 3.8 11/13/2022   CL 100 11/13/2022   CO2 32 11/13/2022   GLUCOSE 71 11/13/2022   BUN 21 11/13/2022   CREATININE 0.80 11/13/2022   BILITOT 0.4 11/13/2022   ALKPHOS 83 08/18/2020   AST 16 11/13/2022   ALT 16 11/13/2022   PROT 7.2 11/13/2022   ALBUMIN 4.2 08/18/2020   CALCIUM 10.0 11/13/2022   GFRAA 83 03/01/2020    Speciality Comments: No specialty comments available.  Procedures:  Large Joint Inj: L knee on 11/26/2022 8:59 AM Indications: pain Details: 27 G 1.5 in needle, medial approach  Arthrogram: No  Medications: 3 mL lidocaine 1 %; 40 mg triamcinolone acetonide 40 MG/ML Aspirate: 14 mL clear; sent for lab analysis Outcome: tolerated well, no immediate complications Procedure, treatment alternatives, risks and benefits explained, specific risks discussed. Consent was given by the patient. Immediately prior to procedure a time out was called to verify the correct patient, procedure, equipment, support staff and site/side marked as required. Patient was prepped and draped in the usual sterile fashion.     Allergies: Shellfish allergy, Atorvastatin, Atracurium & derivatives, Atropine, Iodine, and Statins    Assessment / Plan:     Visit Diagnoses: Primary osteoarthritis of both knees - XR of both knees 11/13/22: Right knee was consistent with moderate  osteoarthritis and moderate chondromalacia patella and the left knee was consistent with moderate osteoarthritis and mild chondromalacia patella.  No chondrocalcinosis was noted. Previous patient at [facility]. Underwent arthroscopic surgery for the left knee on 01/28/2022 for meniscal repair. Inadequate response to Visco gel injections and cortisone injections in the past. Patient has tried Tylenol, ibuprofen, topical agents, ice, heat, and Epsom salt soaks. Lab work from 11/13/22 was reviewed today in the office: CK WNL, Complements WNL, RF<10, anti-CCP<16, ACE 33, uric acid 5.6, ESR 17, CRP WNL, ANA negative, Scl-70-, RNP-, Ro-, Smith-, La-, dsDNA negative. All questions were addressed.  X-ray findings and images were reviewed with the patient today.  Different treatment options were discussed.  Attempted aspiration of the left knee joint effusion today-14 ml of fluid was drawn off-sent for analysis.  A cortisone injection was performed after informed consent was provided.  Procedure note was completed above.  Aftercare was discussed.  She was advised to notify us if her symptoms persist or worsen.  She was given a list of natural anti-inflammatories to start taking.  She will follow up in 3 months or sooner if needed.   Effusion, left knee: X-rays of the left knee are consistent with moderate osteoarthritis and mild chondromalacia patella.  No chondrocalcinosis was noted. Patient presents today with a persistent left knee joint effusion.  After informed consent and aspiration was attempted and a cortisone injection was performed. 14 ml fluid was aspirated.  Sent for analysis.  She tolerated the procedure well.   Procedure note completed above.   Trochanteric bursitis of left hip: Not currently symptomatic.  Primary osteoarthritis of left hip: Good range of motion with no groin pain currently.  Primary osteoarthritis of both feet - XR of both feet 11/13/22 consistent with OA.  X-ray images  and findings were discussed with the patient today in detail.  All questions were addressed.  Patient was given a list of natural anti-inflammatories.    Interstitial pulmonary disease (HCC): CT with mild reticular changes at bases consistent with post-COVID ILD, stable. Under care of Dr. Isaiah Serge.  Reviewed most recent high-resolution chest CT from 09/10/2022: Peripheral and basilar predominant subpleural ground-glass,  minimal reticulation and traction bronchiolectasis, slightly more organized than 03/07/21.  ANA and double-stranded DNA negative and C3 WNL on 12/21/21. Family history of RA-sister.  Lab work from 11/13/22 was reviewed today in the office: CK WNL, Complements WNL, RF<10, anti-CCP<16, ACE 33, uric acid 5.6, ESR 17, CRP WNL, ANA negative, Scl-70-, RNP-, Ro-, Smith-, La-, dsDNA negative.   Other medical conditions are listed as follows:  Aortic atherosclerosis (HCC)  Essential hypertension: Blood pressure was 138/80 today in the office.  Patient was advised to monitor blood pressure closely following the cortisone injection today.  MVP (mitral valve prolapse)  Hiatal hernia  LPRD (laryngopharyngeal reflux disease)  OSA on CPAP  Statin myopathy  Prediabetes  History of hyperlipidemia  Orders: Orders Placed This Encounter  Procedures   Large Joint Inj   Synovial Fluid Analysis, Complete   No orders of the defined types were placed in this encounter.    Follow-Up Instructions: Return for Osteoarthritis.   Gearldine Bienenstock, PA-C  Note - This record has been created using Dragon software.  Chart creation errors have been sought, but may not always  have been located. Such creation errors do not reflect on  the standard of medical care.

## 2022-11-26 ENCOUNTER — Ambulatory Visit: Payer: Medicare Other | Attending: Physician Assistant | Admitting: Physician Assistant

## 2022-11-26 ENCOUNTER — Encounter: Payer: Self-pay | Admitting: Physician Assistant

## 2022-11-26 VITALS — BP 138/80 | HR 76 | Resp 13 | Ht 62.0 in | Wt 153.0 lb

## 2022-11-26 DIAGNOSIS — G4733 Obstructive sleep apnea (adult) (pediatric): Secondary | ICD-10-CM | POA: Diagnosis not present

## 2022-11-26 DIAGNOSIS — M7062 Trochanteric bursitis, left hip: Secondary | ICD-10-CM

## 2022-11-26 DIAGNOSIS — M1612 Unilateral primary osteoarthritis, left hip: Secondary | ICD-10-CM

## 2022-11-26 DIAGNOSIS — M19072 Primary osteoarthritis, left ankle and foot: Secondary | ICD-10-CM

## 2022-11-26 DIAGNOSIS — M25462 Effusion, left knee: Secondary | ICD-10-CM | POA: Diagnosis not present

## 2022-11-26 DIAGNOSIS — I341 Nonrheumatic mitral (valve) prolapse: Secondary | ICD-10-CM

## 2022-11-26 DIAGNOSIS — Z8639 Personal history of other endocrine, nutritional and metabolic disease: Secondary | ICD-10-CM

## 2022-11-26 DIAGNOSIS — K219 Gastro-esophageal reflux disease without esophagitis: Secondary | ICD-10-CM | POA: Diagnosis not present

## 2022-11-26 DIAGNOSIS — I10 Essential (primary) hypertension: Secondary | ICD-10-CM

## 2022-11-26 DIAGNOSIS — I700 Atherosclerosis of aorta: Secondary | ICD-10-CM | POA: Diagnosis not present

## 2022-11-26 DIAGNOSIS — G720 Drug-induced myopathy: Secondary | ICD-10-CM

## 2022-11-26 DIAGNOSIS — J849 Interstitial pulmonary disease, unspecified: Secondary | ICD-10-CM | POA: Diagnosis not present

## 2022-11-26 DIAGNOSIS — K449 Diaphragmatic hernia without obstruction or gangrene: Secondary | ICD-10-CM | POA: Diagnosis not present

## 2022-11-26 DIAGNOSIS — R7303 Prediabetes: Secondary | ICD-10-CM

## 2022-11-26 DIAGNOSIS — M170 Bilateral primary osteoarthritis of knee: Secondary | ICD-10-CM

## 2022-11-26 DIAGNOSIS — M19071 Primary osteoarthritis, right ankle and foot: Secondary | ICD-10-CM | POA: Diagnosis not present

## 2022-11-26 DIAGNOSIS — G8929 Other chronic pain: Secondary | ICD-10-CM

## 2022-11-26 DIAGNOSIS — T466X5A Adverse effect of antihyperlipidemic and antiarteriosclerotic drugs, initial encounter: Secondary | ICD-10-CM

## 2022-11-26 LAB — SYNOVIAL FLUID ANALYSIS, COMPLETE
Basophils, %: 0 %
Eosinophils-Synovial: 0 % (ref 0–2)
Lymphocytes-Synovial Fld: 20 % (ref 0–74)
Monocyte/Macrophage: 70 % — ABNORMAL HIGH (ref 0–69)
Neutrophil, Synovial: 10 % (ref 0–24)
Synoviocytes, %: 0 % (ref 0–15)
WBC, Synovial: 76 cells/uL (ref <150)

## 2022-11-26 MED ORDER — LIDOCAINE HCL 1 % IJ SOLN
3.0000 mL | INTRAMUSCULAR | Status: AC | PRN
Start: 2022-11-26 — End: 2022-11-26
  Administered 2022-11-26: 3 mL

## 2022-11-26 MED ORDER — TRIAMCINOLONE ACETONIDE 40 MG/ML IJ SUSP
40.0000 mg | INTRAMUSCULAR | Status: AC | PRN
Start: 2022-11-26 — End: 2022-11-26
  Administered 2022-11-26: 40 mg via INTRA_ARTICULAR

## 2022-11-26 NOTE — Progress Notes (Signed)
I called the patient to discuss the synovial analysis results.  No crystals were seen.  Patient had a cortisone injection performed today.  She plans on starting OA supplements.

## 2022-11-27 ENCOUNTER — Other Ambulatory Visit: Payer: Self-pay | Admitting: Nurse Practitioner

## 2022-11-27 DIAGNOSIS — I10 Essential (primary) hypertension: Secondary | ICD-10-CM

## 2022-11-28 ENCOUNTER — Other Ambulatory Visit: Payer: Self-pay | Admitting: Pharmacist

## 2022-11-28 DIAGNOSIS — E782 Mixed hyperlipidemia: Secondary | ICD-10-CM

## 2022-11-28 DIAGNOSIS — I700 Atherosclerosis of aorta: Secondary | ICD-10-CM

## 2022-11-28 MED ORDER — REPATHA SURECLICK 140 MG/ML ~~LOC~~ SOAJ
140.0000 mg | SUBCUTANEOUS | 1 refills | Status: DC
Start: 2022-11-28 — End: 2023-08-01

## 2022-12-09 ENCOUNTER — Encounter: Payer: Medicare Other | Admitting: Physician Assistant

## 2023-01-01 ENCOUNTER — Other Ambulatory Visit: Payer: Self-pay | Admitting: Primary Care

## 2023-01-14 DIAGNOSIS — E119 Type 2 diabetes mellitus without complications: Secondary | ICD-10-CM | POA: Diagnosis not present

## 2023-01-14 DIAGNOSIS — H524 Presbyopia: Secondary | ICD-10-CM | POA: Diagnosis not present

## 2023-01-30 ENCOUNTER — Telehealth: Payer: Self-pay | Admitting: Gastroenterology

## 2023-01-30 DIAGNOSIS — G4733 Obstructive sleep apnea (adult) (pediatric): Secondary | ICD-10-CM | POA: Diagnosis not present

## 2023-01-30 NOTE — Telephone Encounter (Signed)
The pt has a history of hemorrhoids and states that they are painful and itching.  There are not appts available within the next few weeks with app and nothing until Nov with [facility].  She is going to call PCP for eval in the meantime but appt has been made to see Dr Meridee Score 11/15.  She will also use Prep H, sitz baths and tucks pads. She will also keep her bowels soft.

## 2023-01-30 NOTE — Telephone Encounter (Signed)
PT is calling about hemorrhoids. They are very swollen and painful. Requesting call back

## 2023-01-31 ENCOUNTER — Encounter: Payer: Self-pay | Admitting: Nurse Practitioner

## 2023-02-03 ENCOUNTER — Ambulatory Visit (INDEPENDENT_AMBULATORY_CARE_PROVIDER_SITE_OTHER): Payer: Medicare Other | Admitting: Family Medicine

## 2023-02-03 ENCOUNTER — Encounter: Payer: Self-pay | Admitting: Family Medicine

## 2023-02-03 VITALS — BP 130/78 | HR 95 | Temp 98.8°F | Ht 62.0 in | Wt 153.6 lb

## 2023-02-03 DIAGNOSIS — K5909 Other constipation: Secondary | ICD-10-CM | POA: Diagnosis not present

## 2023-02-03 DIAGNOSIS — K649 Unspecified hemorrhoids: Secondary | ICD-10-CM | POA: Diagnosis not present

## 2023-02-03 MED ORDER — HYDROCORTISONE ACETATE 25 MG RE SUPP: 25.0000 mg | Freq: Two times a day (BID) | RECTAL | 0 refills | Status: AC | PRN

## 2023-02-03 NOTE — Assessment & Plan Note (Signed)
Continue sitz baths and use suppository PRN for BMs

## 2023-02-03 NOTE — Patient Instructions (Signed)
Hemorrhoids Hemorrhoids are swollen veins in and around the rectum or the opening of the butt (anus). There are two types of hemorrhoids: Internal. These occur in the veins just inside the rectum. They may poke through to the outside and become irritated and painful. External. These occur in the veins outside the anus. They can be felt as a painful swelling or hard lump near the anus. Most hemorrhoids do not cause severe problems. Often, they can be treated at home with diet and lifestyle changes. If home treatments do not help, you may need a procedure to shrink or remove the hemorrhoids. What are the causes? Hemorrhoids are caused by pressure near the anus. This pressure may be caused by: Constipation or diarrhea. Straining to poop. Pregnancy. Obesity. Sitting or riding a bike for a long time. Heavy lifting or other things that cause you to strain. Anal sex. What are the signs or symptoms? Symptoms of this condition include: Pain. Anal itching or irritation. Bleeding from the rectum. Leakage of poop (stool). Swelling of the anus. One or more lumps around the anus. How is this diagnosed? Hemorrhoids can often be diagnosed through a visual exam. Other exams or tests may also be done, such as: A digital rectal exam. This is when your health care provider feels inside your rectum with a gloved finger. Anoscope. This is an exam of the anus using a small tube. A blood test, if you have lost a lot of blood. A sigmoidoscopy or colonoscopy. These are tests to look inside the colon using a tube with a camera on the end. How is this treated? In most cases, hemorrhoids can be treated at home with diet and lifestyle changes. If these changes do not help, you may need to have a procedure done. These procedures can make the hemorrhoids smaller or fully remove them. Common procedures include: Rubber band ligation. Rubber bands are placed at the base of the hemorrhoids to cut off their blood  supply. Sclerotherapy. Medicine is put into the hemorrhoids to shrink them. Infrared coagulation. A type of light energy is used to get rid of the hemorrhoids. Hemorrhoidectomy surgery. The hemorrhoids are removed during surgery. Then, the veins that supply them are tied off. Stapled hemorrhoidopexy surgery. The base of the hemorrhoid is stapled to the wall of the rectum. Follow these instructions at home: Medicines Take over-the-counter and prescription medicines only as told by your provider. Use medicated creams or medicines that are put in the rectum (suppositories) as told by your provider. Eating and drinking  Eat foods that are high in fiber, such as beans, whole grains, and fresh fruits and vegetables. Ask your provider about taking products that have fiber added to them (fiber supplements). Reduce the amount of fat in your diet. You can do this by eating low-fat dairy products, eating less red meat, and avoiding processed foods. Drink enough fluid to keep your pee (urine) pale yellow. Managing pain and swelling  Take warm sitz baths for 20 minutes, 3-4 times a day. This can help ease pain and discomfort. You may do this in a bathtub or you can use a portable sitz bath that fits over the toilet. If told, put ice on the affected area. It may help to use ice packs between sitz baths. Put ice in a plastic bag. Place a towel between your skin and the bag. Leave the ice on for 20 minutes, 2-3 times a day. If your skin turns bright red, remove the ice right away to prevent   skin damage. The risk of damage is higher if you cannot feel pain, heat, or cold. General instructions Exercise. Ask your provider how much and what kind of exercise is best for you. In general, you should do moderate exercise for at least 30 minutes on most days of the week (150 minutes each week). You may want to try walking, biking, or yoga. Go to the bathroom when you have the urge to poop. Do not wait. Avoid  straining to poop. Keep the anus dry and clean. Use wet toilet paper or moist towelettes after you poop. Do not sit on the toilet for a long time. This can increase blood pooling and pain. Where to find more information National Institute of Diabetes and Digestive and Kidney Diseases: niddk.nih.gov Contact a health care provider if: You have more pain and swelling that do not get better with treatment. You have trouble pooping or you are not able to poop. You have pain or inflammation outside the area of the hemorrhoids. Get help right away if: You are bleeding from your rectum and you cannot get it to stop. This information is not intended to replace advice given to you by your health care provider. Make sure you discuss any questions you have with your health care provider. Document Revised: 02/13/2022 Document Reviewed: 02/13/2022 Elsevier Patient Education  2024 [employer]  

## 2023-02-03 NOTE — Progress Notes (Signed)
I,Terri Barajas,acting as a Neurosurgeon for [employer], NP.,have documented all relevant documentation on the behalf of Terri Briddell, NP,as directed by  Terri Barajas Terri Salisbury, NP while in the presence of Dalores Weger, NP.  Subjective:  Patient ID: Terri Barajas , female    DOB: 1946-03-07 , 77 y.o.   MRN: [id number]  Chief Complaint  Patient presents with   Hemorrhoids    HPI  Patient presents today for hemorrhoidal pain. Patient states that she has tried Tucks pads, creams and baths. Patient states she was constipated for a little bit because she has not been taking her Miralax regularly and it pushed out her hemorrhoids about 1 week ago.     Past Medical History:  Diagnosis Date   Anxiety    Arthritis    Complication of anesthesia    some type of sedation medication made her feel crazy   Dysrhythmia    Family history of adverse reaction to anesthesia    daughter has Malignant Hyperthermia hx -    GERD (gastroesophageal reflux disease)    Glaucoma    H/O: hysterectomy 1978   Heart disease    History of hiatal hernia    Hypertension    MVP (mitral valve prolapse)    Panic attacks    Pre-diabetes    Sciatica      Family History  Problem Relation Age of Onset   Pancreatic cancer Mother    Alcohol abuse Father    Asthma Sister    Hypertension Sister    Arthritis Sister    Arthritis Sister    Rheum arthritis Sister    Diabetes Brother    Arthritis Brother    Hyperlipidemia Brother    Hypertension Brother    Glaucoma Brother    Colon polyps Brother    Prostate cancer Brother    Congenital heart disease Brother    Throat cancer Brother    Alcohol abuse Brother    Healthy Daughter    Healthy Son    Colon cancer Neg Hx    Esophageal cancer Neg Hx    Inflammatory bowel disease Neg Hx    Liver disease Neg Hx    Rectal cancer Neg Hx    Stomach cancer Neg Hx      Current Outpatient Medications:    hydrocortisone (ANUSOL-HC) 25 MG suppository, Place 1 suppository (25 mg  total) rectally 2 (two) times daily as needed for hemorrhoids or anal itching., Disp: 24 suppository, Rfl: 0   acetaminophen (TYLENOL) 500 MG tablet, Take 500-1,000 mg by mouth every 6 (six) hours as needed for moderate pain., Disp: , Rfl:    ALPRAZolam (XANAX) 0.25 MG tablet, Take 1 tablet (0.25 mg total) by mouth daily as needed for anxiety., Disp: 30 tablet, Rfl: 0   aluminum-magnesium hydroxide 200-200 MG/5ML suspension, Take 5 mLs by mouth every 6 (six) hours as needed for indigestion., Disp: , Rfl:    ascorbic acid (VITAMIN C) 500 MG tablet, Take 500 mg by mouth daily., Disp: , Rfl:    aspirin 81 MG chewable tablet, Chew 81 mg by mouth daily., Disp: , Rfl:    benzonatate (TESSALON) 100 MG capsule, TAKE 1 CAPSULE BY MOUTH THREE TIMES A DAY AS NEEDED FOR COUGH, Disp: 60 capsule, Rfl: 1   Biotin 5 MG CAPS, Take 5 mg by mouth daily., Disp: , Rfl:    calcium carbonate (OSCAL) 1500 (600 Ca) MG TABS tablet, Take 600 mg of elemental calcium by mouth daily., Disp: , Rfl:  cholecalciferol (VITAMIN D3) 25 MCG (1000 UNIT) tablet, Take 3,000 Units by mouth daily., Disp: , Rfl:    Cyanocobalamin (VITAMIN B 12) 100 MCG LOZG, Take 1 tablet by mouth daily., Disp: , Rfl:    diltiazem (CARDIZEM) 30 MG tablet, Take 1 tablet (30 mg total) by mouth 2 (two) times daily., Disp: 200 tablet, Rfl: 2   dorzolamide-timolol (COSOPT) 22.3-6.8 MG/ML ophthalmic solution, Apply 1 drop to eye in the morning and at bedtime., Disp: , Rfl:    Evolocumab (REPATHA SURECLICK) 140 MG/ML SOAJ, Inject 140 mg into the skin every 14 (fourteen) days., Disp: 6 mL, Rfl: 1   fluticasone (FLONASE) 50 MCG/ACT nasal spray, Place 1 spray into both nostrils daily., Disp: 16 g, Rfl: 0   guaiFENesin (MUCINEX) 600 MG 12 hr tablet, Take 1 tablet (600 mg total) by mouth 2 (two) times daily as needed., Disp: 60 tablet, Rfl: 1   hydrochlorothiazide (HYDRODIURIL) 25 MG tablet, TAKE 1 TABLET BY MOUTH DAILY, Disp: 100 tablet, Rfl: 0   ibuprofen (ADVIL)  200 MG tablet, Take 200-400 mg by mouth every 6 (six) hours as needed for moderate pain., Disp: , Rfl:    latanoprost (XALATAN) 0.005 % ophthalmic solution, Place 1 drop into both eyes at bedtime. , Disp: , Rfl:    loratadine (CLARITIN REDITABS) 10 MG dissolvable tablet, Take 10 mg by mouth daily as needed for allergies., Disp: , Rfl:    metoprolol tartrate (LOPRESSOR) 100 MG tablet, Take one tablet two hours prior to cardiac CTA. (Patient not taking: Reported on 11/13/2022), Disp: 1 tablet, Rfl: 0   Multiple Vitamins-Minerals (CENTRUM SILVER 50+WOMEN PO), Take 1 tablet by mouth daily., Disp: , Rfl:    omeprazole (PRILOSEC) 20 MG capsule, Take 20 mg by mouth 2 (two) times daily before a meal., Disp: , Rfl:    polyethylene glycol (MIRALAX / GLYCOLAX) packet, Take 17 g by mouth daily as needed for moderate constipation., Disp: , Rfl:    Probiotic Product (ALIGN PO), Take 1 capsule by mouth daily., Disp: , Rfl:    RHOPRESSA 0.02 % SOLN, Place 1 drop into both eyes every evening., Disp: , Rfl:    vitamin E 180 MG (400 UNITS) capsule, Take 400 Units by mouth daily., Disp: , Rfl:    Allergies  Allergen Reactions   Shellfish Allergy Anaphylaxis and Shortness Of Breath   Atorvastatin     Myalgias    Atracurium & Derivatives Other (See Comments)    Unknown   Atropine Other (See Comments)    Heart racing   Iodine Other (See Comments)    Unknown   Statins     "didn't feel right"      Review of Systems  Constitutional: Negative.   HENT: Negative.    Eyes: Negative.   Gastrointestinal:  Positive for constipation and rectal pain. Negative for anal bleeding and blood in stool.  Genitourinary: Negative.      Today's Vitals   02/03/23 1409  BP: 130/78  Pulse: 95  Temp: 98.8 F (37.1 C)  TempSrc: Oral  Weight: 153 lb 9.6 oz (69.7 kg)  Height: 5\' 2"  (1.575 m)   Body mass index is 28.09 kg/m.  Wt Readings from Last 3 Encounters:  02/03/23 153 lb 9.6 oz (69.7 kg)  11/26/22 153 lb (69.4 kg)   11/13/22 152 lb 12.8 oz (69.3 kg)     Objective:  Physical Exam Exam conducted with a chaperone present.  Constitutional:      Appearance: Normal appearance.  HENT:  Head: Normocephalic.  Cardiovascular:     Rate and Rhythm: Normal rate and regular rhythm.     Pulses: Normal pulses.     Heart sounds: Normal heart sounds.  Pulmonary:     Effort: Pulmonary effort is normal.     Breath sounds: Normal breath sounds.  Abdominal:     General: Bowel sounds are normal.  Genitourinary:    Rectum: Tenderness and internal hemorrhoid present.  Neurological:     Mental Status: She is alert.         Assessment And Plan:  Hemorrhoids, unspecified hemorrhoid type Assessment & Plan: Continue sitz baths and use suppository PRN for BMs  Orders: -     Hydrocortisone Acetate; Place 1 suppository (25 mg total) rectally 2 (two) times daily as needed for hemorrhoids or anal itching.  Dispense: 24 suppository; Refill: 0  Other constipation Assessment & Plan: Advised to increase fiber and take Miralax daily to avoid constipation.     Return if symptoms worsen or fail to improve.  Patient was given opportunity to ask questions. Patient verbalized understanding of the plan and was able to repeat key elements of the plan. All questions were answered to their satisfaction.    I, Linzi Ohlinger, NP, have reviewed all documentation for this visit. The documentation on 02/07/23 for the exam, diagnosis, procedures, and orders are all accurate and complete.   IF YOU HAVE BEEN REFERRED TO A SPECIALIST, IT MAY TAKE 1-2 WEEKS TO SCHEDULE/PROCESS THE REFERRAL. IF YOU HAVE NOT HEARD FROM US/SPECIALIST IN TWO WEEKS, PLEASE GIVE [place] A CALL AT [phone].

## 2023-02-03 NOTE — Assessment & Plan Note (Signed)
Advised to increase fiber and take Miralax daily to avoid constipation.

## 2023-02-05 ENCOUNTER — Other Ambulatory Visit: Payer: Self-pay | Admitting: Nurse Practitioner

## 2023-02-05 DIAGNOSIS — Z1231 Encounter for screening mammogram for malignant neoplasm of breast: Secondary | ICD-10-CM

## 2023-02-09 ENCOUNTER — Other Ambulatory Visit: Payer: Self-pay | Admitting: Nurse Practitioner

## 2023-02-09 DIAGNOSIS — I10 Essential (primary) hypertension: Secondary | ICD-10-CM

## 2023-02-11 IMAGING — CT CT CARDIAC CORONARY ARTERY CALCIUM SCORE
3 series · 14 of 20 positions shown, 15 images · non-contrast
Comparison: CT of the chest without contrast on 03/06/2020
COMPARISON: CT of the chest without contrast on 03/06/2020

Addendum:
EXAM:
OVER-READ INTERPRETATION  CT CHEST

The following report is an over-read performed by radiologist Dr.
Azadig Rakwe [REDACTED] on 11/06/2020. This
over-read does not include interpretation of cardiac or coronary
anatomy or pathology. The coronary calcium score interpretation by
the cardiologist is attached.
CLINICAL DATA: Cardiovascular Disease Risk stratification
Coronary Calcium Score
TECHNIQUE: A gated, non-contrast computed tomography scan of the heart was
performed using 3mm slice thickness. Axial images were analyzed on a
dedicated workstation. Calcium scoring of the coronary arteries was
performed using the Agatston method.

[Series 2: casc 3.0 bv41 2 bestdiast 68 % · axial · 0.37mm/px · z∈[-184,-110]mm · 4 of 43 slices shown, 5 images]
[im 9/43  vessel]
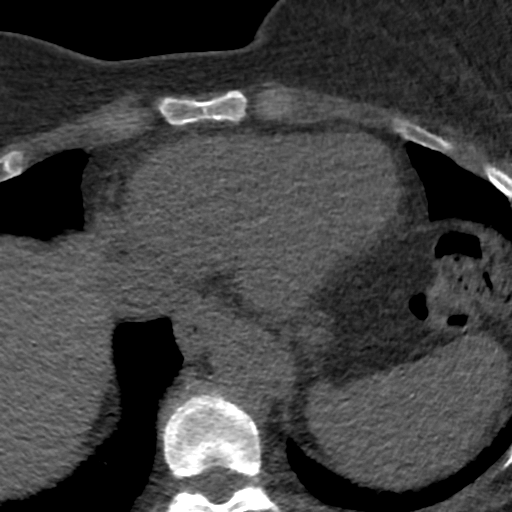
[im 9/43  lung]
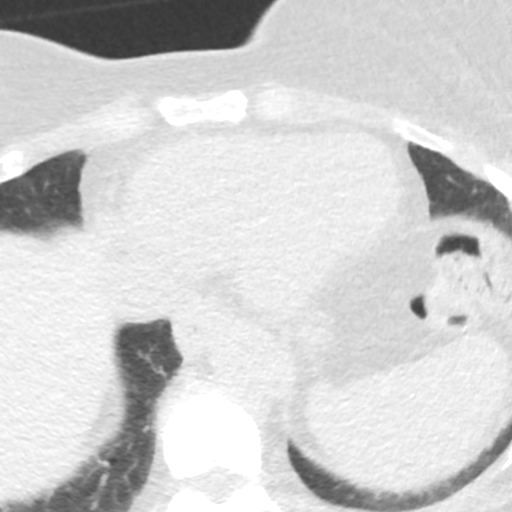
[im 17/43  vessel]
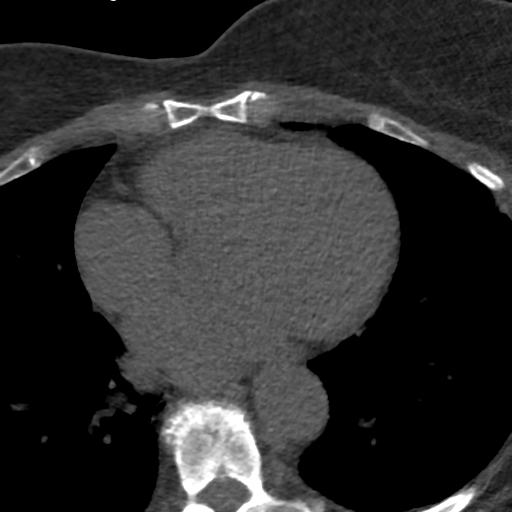
[im 26/43  vessel]
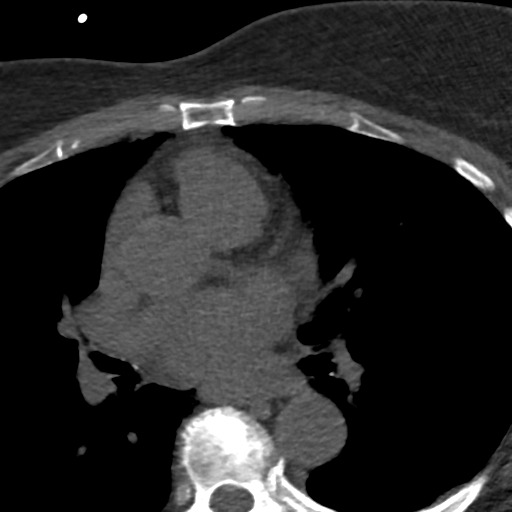
[im 34/43  vessel]
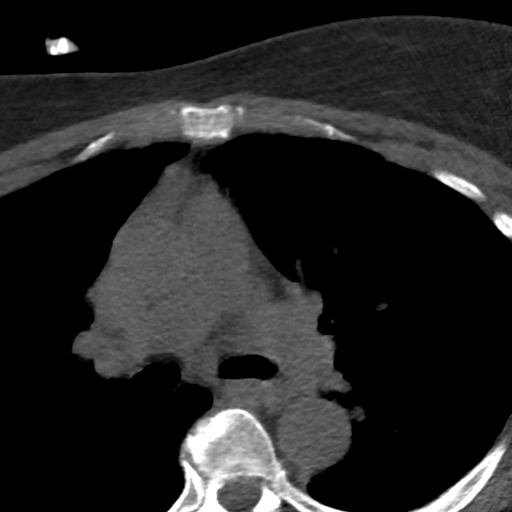

[Series 3: lung 68 % · axial · 0.66mm/px · z∈[-188,-104]mm · 5 of 43 slices shown]
[im 8/43  lung]
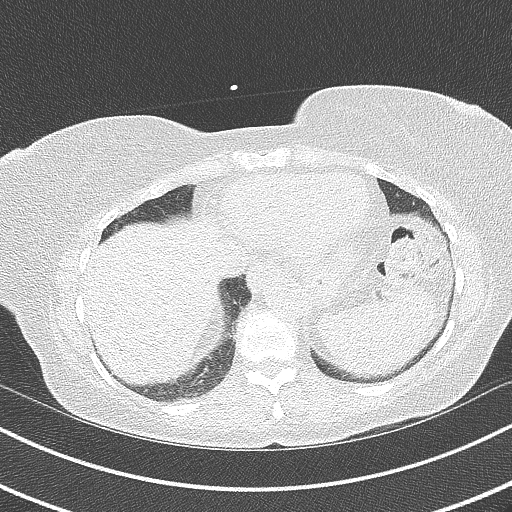
[im 15/43  lung]
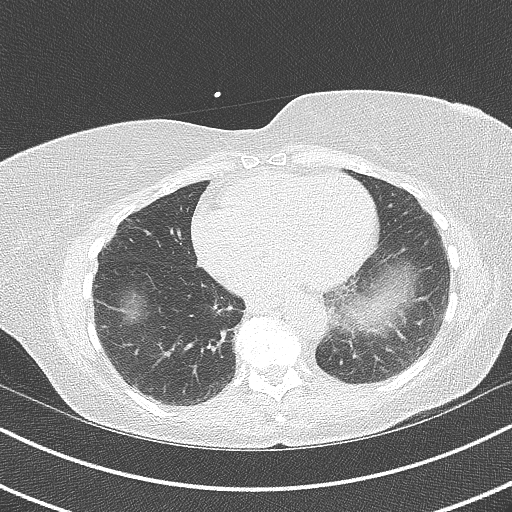
[im 22/43  lung]
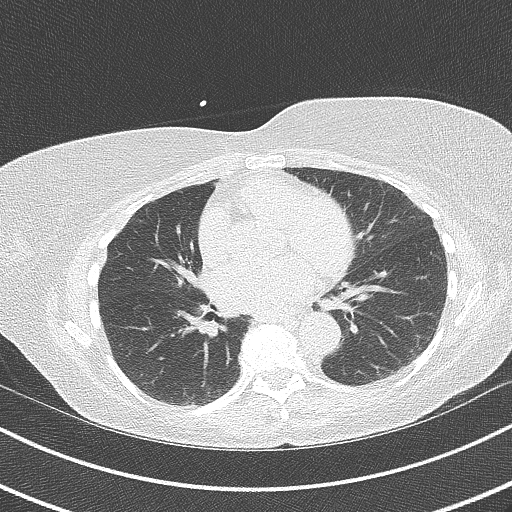
[im 29/43  lung]
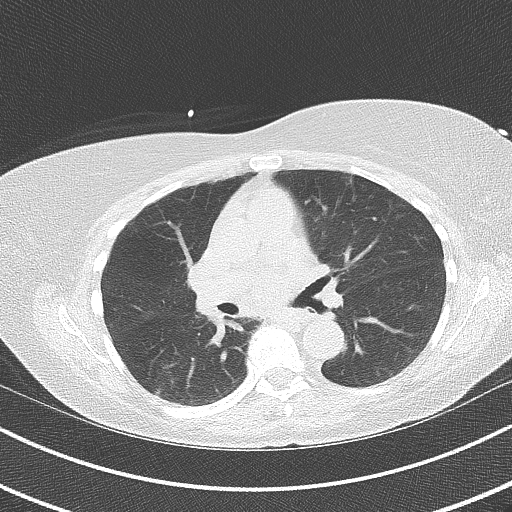
[im 36/43  lung]
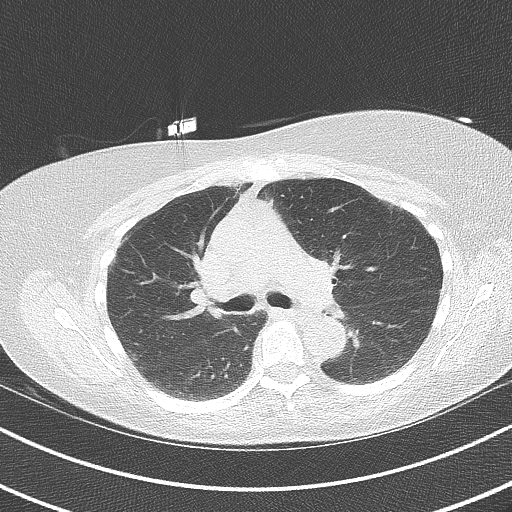

[Series 4: lung st 68 % · axial · 0.66mm/px · z∈[-188,-104]mm · 5 of 43 slices shown]
[im 8/43  lung]
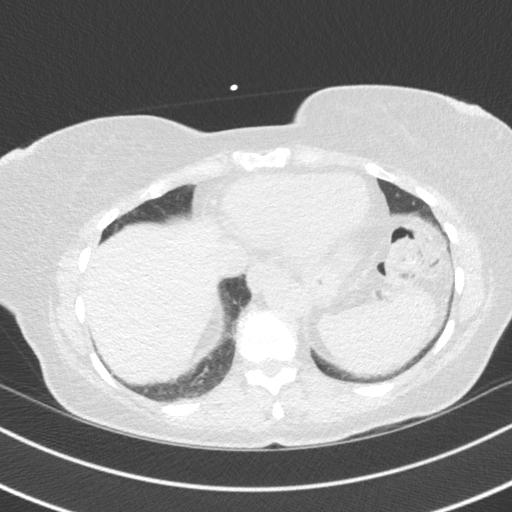
[im 15/43  lung]
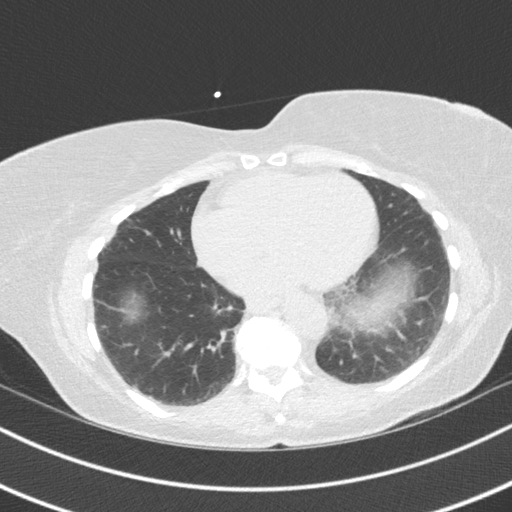
[im 22/43  lung]
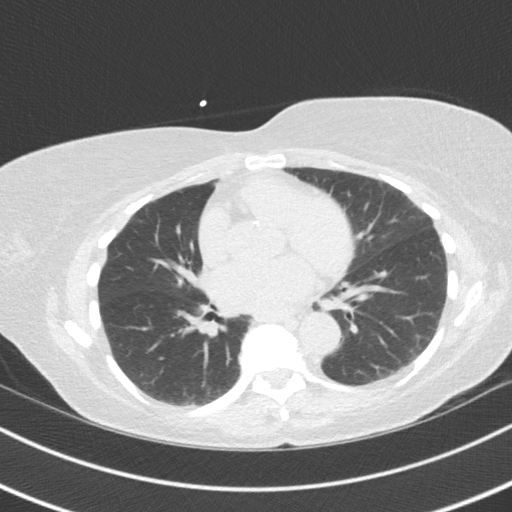
[im 29/43  lung]
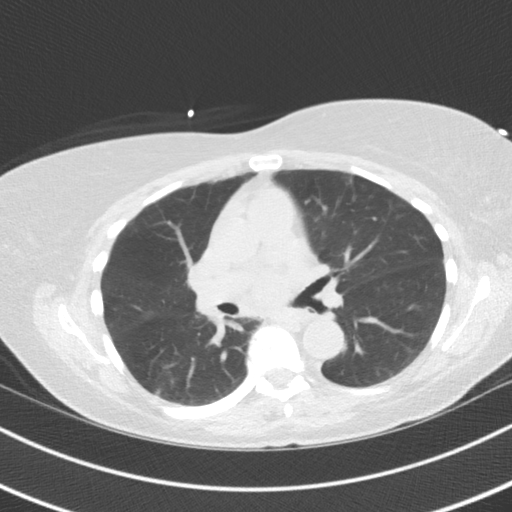
[im 36/43  lung]
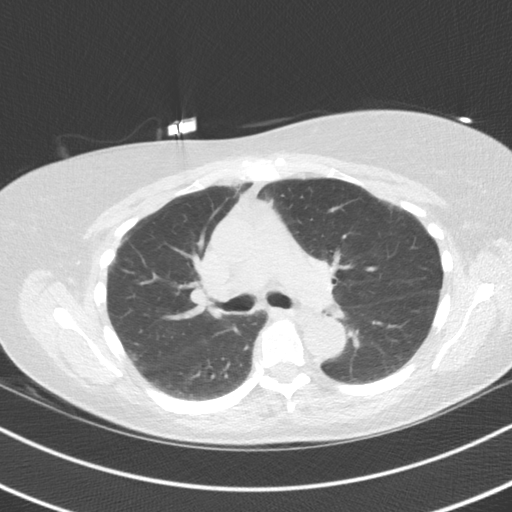

[14 of 20 positions shown; findings below may reference images not displayed]

FINDINGS: Vascular: No significant noncardiac vascular findings.

Mediastinum/Nodes: Visualized mediastinum and hilar regions
demonstrate no lymphadenopathy or masses.

Lungs/Pleura: There are some persistent mild patchy areas residual
ground-glass nodularity in the subpleural aspects of both lungs as
well as more focal areas of nodularity in the peripheral left lower
lobe. These are similar to the prior CT and demonstrate no
progression. Areas of nodularity are again potentially related to
prior J4QUA-QD infection. No edema, pleural fluid or pneumothorax
identified.

Upper Abdomen: No acute abnormality.

Musculoskeletal: No chest wall mass or suspicious bone lesions
identified.
IMPRESSION: Stable probable postinflammatory lung disease related to prior
J4QUA-QD infection with residual mild patchy ground grass nodularity
in the subpleural aspects of both lungs and more focal peripheral
subpleural nodularity in the left lower lobe.
FINDINGS: Coronary arteries: Normal origins.

Coronary Calcium Score:

Left main: 0

Left anterior descending artery: 0

Left circumflex artery: 0

Right coronary artery: 0

Total: 0

Percentile: 0

Pericardium: Normal.

Ascending Aorta: Normal caliber.  Scattered calcifications.

Non-cardiac: See separate report from [REDACTED].
IMPRESSION: Coronary calcium score of 0. This was 0 percentile for age-, race-,
and sex-matched controls.



If CAC=0, it is reasonable to withhold statin therapy and reassess
in 5 to 10 years, as long as higher risk conditions are absent
(diabetes mellitus, family history of premature CHD in first degree
relatives (males <55 years; females <65 years), cigarette smoking,
or LDL >=190 mg/dL).

If CAC is 1 to 99, it is reasonable to initiate statin therapy for
patients >=55 years of age.

If CAC is >=100 or >=75th percentile, it is reasonable to initiate
statin therapy at any age.

Cardiology referral should be considered for patients with CAC
scores >=400 or >=75th percentile.

*0088 AHA/ACC/AACVPR/AAPA/ABC/DANNY JAVIER/REBEGEL/LIEN PEGGI/[place]/MIM/NYA/ASHELY
Guideline on the Management of Blood Cholesterol: A Report of the
American College of Cardiology/American Heart Association Task Force
on Clinical Practice Guidelines. J Am Coll Cardiol.
3658;73(24):9840-9298.

*** End of Addendum ***
EXAM:
OVER-READ INTERPRETATION  CT CHEST

The following report is an over-read performed by radiologist Dr.
Azadig Rakwe [REDACTED] on 11/06/2020. This
over-read does not include interpretation of cardiac or coronary
anatomy or pathology. The coronary calcium score interpretation by
the cardiologist is attached.
FINDINGS: Vascular: No significant noncardiac vascular findings.

Mediastinum/Nodes: Visualized mediastinum and hilar regions
demonstrate no lymphadenopathy or masses.

Lungs/Pleura: There are some persistent mild patchy areas residual
ground-glass nodularity in the subpleural aspects of both lungs as
well as more focal areas of nodularity in the peripheral left lower
lobe. These are similar to the prior CT and demonstrate no
progression. Areas of nodularity are again potentially related to
prior J4QUA-QD infection. No edema, pleural fluid or pneumothorax
identified.

Upper Abdomen: No acute abnormality.

Musculoskeletal: No chest wall mass or suspicious bone lesions
identified.
IMPRESSION: Stable probable postinflammatory lung disease related to prior
J4QUA-QD infection with residual mild patchy ground grass nodularity
in the subpleural aspects of both lungs and more focal peripheral
subpleural nodularity in the left lower lobe.

## 2023-02-13 NOTE — Progress Notes (Unsigned)
Office Visit Note  Patient: Terri Barajas             Date of Birth: 12-02-1945           MRN: [id number]             PCP: Arnette Felts, FNP Referring: Arnette Felts, FNP Visit Date: 02/27/2023 Occupation: @GUAROCC @  Subjective:  Pain in both knees  History of Present Illness: Terri Barajas is a 77 y.o. female with history of osteoarthritis.  Patient presents today with ongoing pain involving both knees, left greater than right.  Patient had a left knee aspiration and cortisone injection performed on 11/26/2022 which provided minimal to no relief.  She has been experiencing constant pain on a daily basis which has been interfering with her activity level.  She has been unable to exercise like she would like to.  She is also been experiencing nocturnal pain.  She has noticed instability at times including buckling and catching.  She has also noticed ongoing swelling in the left knee.  She currently rates the pain in her knee a 5 out of 10.  She has tried ice, heat, Tylenol, and ibuprofen.  She has been unable to perform lower extremity muscle strengthening exercises like she would like to do due to the severity of pain.  Patient previously had left knee arthroscopic surgery for meniscal repair in August 2023. She denies any other joint pain or joint swelling at this time.    Activities of Daily Living:  Patient reports morning stiffness for 1 minute.   Patient Reports nocturnal pain.  Difficulty dressing/grooming: Reports Difficulty climbing stairs: Reports Difficulty getting out of chair: Denies Difficulty using hands for taps, buttons, cutlery, and/or writing: Denies  Review of Systems  Constitutional:  Negative for fatigue.  HENT:  Negative for mouth sores and mouth dryness.   Eyes:  Positive for photophobia. Negative for pain, redness and dryness.  Respiratory:  Negative for shortness of breath.   Cardiovascular:  Negative for chest pain and palpitations.   Gastrointestinal:  Negative for blood in stool, constipation and diarrhea.  Endocrine: Negative for increased urination.  Genitourinary:  Negative for involuntary urination.  Musculoskeletal:  Positive for joint pain, gait problem, joint pain, joint swelling, myalgias, muscle weakness, morning stiffness, muscle tenderness and myalgias.  Skin:  Negative for color change, rash, hair loss and sensitivity to sunlight.  Allergic/Immunologic: Negative for susceptible to infections.  Neurological:  Negative for dizziness and headaches.  Hematological:  Negative for swollen glands.  Psychiatric/Behavioral:  Negative for depressed mood and sleep disturbance. The patient is not nervous/anxious.     PMFS History:  Patient Active Problem List   Diagnosis Date Noted   Hemorrhoids 02/03/2023   Prediabetes 07/29/2022   Leg fatigue 07/22/2022   Statin myopathy 07/22/2022   History of arthroscopy of knee 05/14/2022   Globus sensation 01/31/2022   LPRD (laryngopharyngeal reflux disease) 01/31/2022   Tear of medial meniscus of knee 01/08/2022   Colon cancer screening 01/04/2022   Abdominal distention 01/04/2022   Interstitial pulmonary disease (HCC) 12/19/2021   Osteoarthritis of left hip 10/22/2021   Abdominal bloating 09/03/2021   Chronic intermittent hypoxia with obstructive sleep apnea 09/03/2021   Myopathies 03/28/2021   Osteoarthritis of left knee 10/10/2020   Pes anserinus bursitis of left knee 10/10/2020   Trochanteric bursitis of left hip 10/10/2020   Body mass index (BMI) 26.0-26.9, adult 07/06/2020   Disc displacement, lumbar 07/06/2020   OSA on CPAP 01/06/2019  Abnormal CT of the abdomen 10/04/2018   Radiculopathy affecting upper extremity, left 07/25/2018   Hyperlipidemia 05/13/2018   Situational anxiety 04/29/2018   MVP (mitral valve prolapse) 04/29/2018   Sleep-disordered breathing 04/29/2018   Family History of Hyperthermia, malignant 04/23/2018   Barrett's esophagus  03/01/2018   Elevated lipase 02/18/2018   Hiatal hernia 02/18/2018   Constipation 02/18/2018   Bloating 02/18/2018   Aortic atherosclerosis (HCC) 01/23/2018   Gastroesophageal reflux disease without esophagitis    Glaucoma    Essential hypertension     Past Medical History:  Diagnosis Date   Anxiety    Arthritis    Complication of anesthesia    some type of sedation medication made her feel crazy   Dysrhythmia    Family history of adverse reaction to anesthesia    daughter has Malignant Hyperthermia hx -    GERD (gastroesophageal reflux disease)    Glaucoma    H/O: hysterectomy 1978   Heart disease    History of hiatal hernia    Hypertension    MVP (mitral valve prolapse)    Panic attacks    Pre-diabetes    Sciatica     Family History  Problem Relation Age of Onset   Pancreatic cancer Mother    Alcohol abuse Father    Asthma Sister    Hypertension Sister    Arthritis Sister    Arthritis Sister    Rheum arthritis Sister    Diabetes Brother    Arthritis Brother    Hyperlipidemia Brother    Hypertension Brother    Glaucoma Brother    Colon polyps Brother    Prostate cancer Brother    Congenital heart disease Brother    Throat cancer Brother    Alcohol abuse Brother    Healthy Daughter    Healthy Son    Colon cancer Neg Hx    Esophageal cancer Neg Hx    Inflammatory bowel disease Neg Hx    Liver disease Neg Hx    Rectal cancer Neg Hx    Stomach cancer Neg Hx    Past Surgical History:  Procedure Laterality Date   APPENDECTOMY  1972   BACK SURGERY  07/14/2020   chipped disc   BIOPSY  07/06/2018   Procedure: BIOPSY;  Surgeon: Lemar Lofty., MD;  Location: [facility]COPY;  Service: Gastroenterology;;   BIOPSY  03/04/2022   Procedure: BIOPSY;  Surgeon: Lemar Lofty., MD;  Location: [facility] ENDOSCOPY;  Service: Gastroenterology;;   CATARACT EXTRACTION Bilateral 2013   CESAREAN SECTION  1976 and 1978   COLONOSCOPY     COLONOSCOPY WITH PROPOFOL  N/A 03/04/2022   Procedure: COLONOSCOPY WITH PROPOFOL;  Surgeon: Lemar Lofty., MD;  Location: [facility] ENDOSCOPY;  Service: Gastroenterology;  Laterality: N/A;   ESOPHAGOGASTRODUODENOSCOPY (EGD) WITH PROPOFOL N/A 07/06/2018   Procedure: ESOPHAGOGASTRODUODENOSCOPY (EGD) WITH PROPOFOL;  Surgeon: Meridee Score Netty Starring., MD;  Location: [facility] ENDOSCOPY;  Service: Gastroenterology;  Laterality: N/A;   ESOPHAGOGASTRODUODENOSCOPY (EGD) WITH PROPOFOL N/A 03/04/2022   Procedure: ESOPHAGOGASTRODUODENOSCOPY (EGD) WITH PROPOFOL;  Surgeon: Meridee Score Netty Starring., MD;  Location: [facility] ENDOSCOPY;  Service: Gastroenterology;  Laterality: N/A;   HAND SURGERY Bilateral    HEMORRHOID SURGERY  2018   KNEE CARTILAGE SURGERY Left 01/28/2022   PLANTAR FASCIA RELEASE Right    POLYPECTOMY  03/04/2022   Procedure: POLYPECTOMY;  Surgeon: Meridee Score Netty Starring., MD;  Location: [facility] ENDOSCOPY;  Service: Gastroenterology;;   S/P Hysterectomy   1987   SAVORY DILATION N/A 03/04/2022   Procedure:  SAVORY DILATION;  Surgeon: Mansouraty, Netty Starring., MD;  Location: [facility] ENDOSCOPY;  Service: Gastroenterology;  Laterality: N/A;   Social History   Social History Narrative   Not on file   Immunization History  Administered Date(s) Administered   Fluad Quad(high Dose 65+) 03/11/2019, 03/27/2022   Influenza, High Dose Seasonal PF 04/29/2018, 02/26/2021   Influenza-Unspecified 03/16/2020   PFIZER Comirnaty(Gray Top)Covid-19 Tri-Sucrose Vaccine 12/26/2020   PFIZER(Purple Top)SARS-COV-2 Vaccination 09/02/2019, 09/28/2019, 03/27/2020, 04/14/2021   Pfizer Covid-19 Vaccine Bivalent Booster 22yrs & up 04/14/2021   Pneumococcal Conjugate-13 03/11/2019   Pneumococcal Polysaccharide-23 06/09/2017   Tdap 02/10/2021   Unspecified SARS-COV-2 Vaccination 04/13/2022   Zoster Recombinant(Shingrix) 07/12/2021, 07/22/2022     Objective: Vital Signs: BP 127/72 (BP Location: Left Arm, Patient Position: Sitting, Cuff Size: Normal)   Pulse 76    Resp 13   Ht 5\' 3"  (1.6 m)   Wt 152 lb 3.2 oz (69 kg)   LMP  (LMP Unknown)   BMI 26.96 kg/m    Physical Exam Vitals and nursing note reviewed.  Constitutional:      Appearance: She is well-developed.  HENT:     Head: Normocephalic and atraumatic.  Eyes:     Conjunctiva/sclera: Conjunctivae normal.  Cardiovascular:     Rate and Rhythm: Normal rate and regular rhythm.     Heart sounds: Normal heart sounds.  Pulmonary:     Effort: Pulmonary effort is normal.     Breath sounds: Normal breath sounds.  Abdominal:     General: Bowel sounds are normal.     Palpations: Abdomen is soft.  Musculoskeletal:     Cervical back: Normal range of motion.  Lymphadenopathy:     Cervical: No cervical adenopathy.  Skin:    General: Skin is warm and dry.     Capillary Refill: Capillary refill takes less than 2 seconds.  Neurological:     Mental Status: She is alert and oriented to person, place, and time.  Psychiatric:        Behavior: Behavior normal.      Musculoskeletal Exam: C-spine, thoracic spine, lumbar spine have good range of motion.  Shoulder joints, elbow joints, wrist joints, MCPs, PIPs, DIPs have good range of motion with no synovitis.  Complete fist formation noted bilaterally.  Hip joints have good range of motion with no groin pain.  Tenderness over the left trochanteric bursa.  Warmth and a small to moderate effusion noted in the left knee.  Painful range of motion of the left knee joint noted.  Right knee joint has good range of motion with no warmth or effusion.  Ankle joints have good range of motion with no tenderness or synovitis.  Some thickening of the right ankle noted.  CDAI Exam: CDAI Score: -- Patient Global: --; Provider Global: -- Swollen: --; Tender: -- Joint Exam 02/27/2023   No joint exam has been documented for this visit   There is currently no information documented on the homunculus. Go to the Rheumatology activity and complete the homunculus joint  exam.  Investigation: No additional findings.  Imaging: No results found.  Recent Labs: Lab Results  Component Value Date   WBC 7.4 11/13/2022   HGB 12.8 11/13/2022   PLT 262 11/13/2022   NA 140 11/13/2022   K 3.8 11/13/2022   CL 100 11/13/2022   CO2 32 11/13/2022   GLUCOSE 71 11/13/2022   BUN 21 11/13/2022   CREATININE 0.80 11/13/2022   BILITOT 0.4 11/13/2022   ALKPHOS 83 08/18/2020  AST 16 11/13/2022   ALT 16 11/13/2022   PROT 7.2 11/13/2022   ALBUMIN 4.2 08/18/2020   CALCIUM 10.0 11/13/2022   GFRAA 83 03/01/2020    Speciality Comments: No specialty comments available.  Procedures:  No procedures performed Allergies: Shellfish allergy, Atorvastatin, Atracurium & derivatives, Atropine, Iodine, and Statins    Assessment / Plan:     Visit Diagnoses: Primary osteoarthritis of both knees - Moderate OA, mild chondromalacia patella left, moderate chondromalacia patella right: Patient continues to have chronic pain involving both knees, left greater than right.  She has been experiencing instability, nocturnal pain, and recurrent effusions in the left knee.  Plan to proceed with an MRI of the left knee for further evaluation as discussed below.  Effusion, left knee: Family history of rheumatoid arthritis-Sister, RF-, Anti-CCP negative, Hx of left knee arthroscopic surgery on 01/28/22 for meniscal repair.  Previously had inadequate response to viscosupplementation and cortisone injections.  Patient had a left knee joint aspiration and cortisone injection performed on 11/26/2022-negative for crystals, 70 monocytes-unremarkable analysis. Patient continues to have recurrent left knee joint effusions.  Small to moderate effusion was noted on examination today.  She is painful range of motion of the left knee joint.  She is been experiencing nocturnal pain as well as difficulty performing ADLs.  She is been unable to perform her normal exercise regimen due to the discomfort.  She has  been having difficulty performing lower extremity muscle strengthening exercises due to severity of pain.  She has been experiencing increased instability including buckling and catching in the left knee.  She has tried ice, heat, Tylenol, and ibuprofen with no improvement.  She also had an inadequate response to the cortisone injection on 11/26/2022. Discussed the concern for an internal derangement versus synovitis due to underlying possible inflammatory arthritis.   Patient has family history of rheumatoid arthritis and post-Covid ILD changes on CT, so possible inflammatory arthritis such as seronegative rheumatoid arthritis is in the differential. Plan to proceed with an MRI of the left knee for further evaluation.  Trochanteric bursitis of left hip: She has ongoing tenderness over the left trochanteric bursa.   Primary osteoarthritis of left hip: Slightly limited ROM.  No groin pain currently.    Primary osteoarthritis of both feet: XR of both feet 11/13/22 consistent with OA.   Interstitial pulmonary disease (HCC): CT with mild reticular changes at bases consistent with post-COVID ILD, stable. Under care of Dr. Isaiah Serge.  Reviewed most recent high-resolution chest CT from 09/10/2022: Peripheral and basilar predominant subpleural ground-glass, minimal reticulation and traction bronchiolectasis, slightly more organized than 03/07/21.  ANA and double-stranded DNA negative and C3 WNL on 12/21/21. Family history of RA-sister.  Lab work from 11/13/22 was reviewed today in the office: CK WNL, Complements WNL, RF<10, anti-CCP<16, ACE 33, uric acid 5.6, ESR 17, CRP WNL, ANA negative, Scl-70-, RNP-, Ro-, Smith-, La-, dsDNA negative.  No new or worsening pulmonary symptoms.  Other medical conditions are listed as follows:  Aortic atherosclerosis (HCC)  Essential hypertension: Blood pressure was 127/72 today in the office.  MVP (mitral valve prolapse)  Hiatal hernia  LPRD (laryngopharyngeal reflux  disease)  OSA on CPAP  Statin myopathy  Prediabetes  History of hyperlipidemia  Orders: No orders of the defined types were placed in this encounter.  No orders of the defined types were placed in this encounter.     Follow-Up Instructions: Return in about 6 weeks (around 04/10/2023) for Osteoarthritis.   Gearldine Bienenstock, PA-C  Note - This record has been created using [employer].  Chart creation errors have been sought, but may not always  have been located. Such creation errors do not reflect on  the standard of medical care.

## 2023-02-16 ENCOUNTER — Encounter: Payer: Self-pay | Admitting: Nurse Practitioner

## 2023-02-19 ENCOUNTER — Ambulatory Visit: Payer: Medicare Other | Admitting: Family Medicine

## 2023-02-20 ENCOUNTER — Ambulatory Visit (INDEPENDENT_AMBULATORY_CARE_PROVIDER_SITE_OTHER): Payer: Medicare Other | Admitting: Family Medicine

## 2023-02-20 ENCOUNTER — Encounter: Payer: Self-pay | Admitting: Family Medicine

## 2023-02-20 VITALS — BP 120/70 | HR 75 | Temp 98.1°F | Ht 62.0 in | Wt 150.0 lb

## 2023-02-20 DIAGNOSIS — R1032 Left lower quadrant pain: Secondary | ICD-10-CM | POA: Diagnosis not present

## 2023-02-20 LAB — POCT URINALYSIS DIPSTICK
Bilirubin, UA: NEGATIVE
Blood, UA: NEGATIVE
Glucose, UA: NEGATIVE
Ketones, UA: NEGATIVE
Leukocytes, UA: NEGATIVE
Nitrite, UA: NEGATIVE
Protein, UA: NEGATIVE
Spec Grav, UA: 1.02 (ref 1.010–1.025)
Urobilinogen, UA: 1 U/dL
pH, UA: 7.5 (ref 5.0–8.0)

## 2023-02-20 NOTE — Progress Notes (Signed)
Terri Barajas, CMA,acting as a Neurosurgeon for [employer], NP.,have documented all relevant documentation on the behalf of Terri Hose, NP,as directed by  Terri Hose, NP while in the presence of Terri Hose, NP.  Subjective:  Patient ID: Terri Barajas , female    DOB: 1945/11/25 , 77 y.o.   MRN: [id number]  Chief Complaint  Patient presents with   Abdominal Pain    HPI  Patient presents today for stomach pain that started about a week ago. She rates it 10/10 at its worst. It is on the LLQ. Denies any Nausea,Vomiting or diarrhea. Patient states she is having pelvic pain and experiencing dyspareunia also , will order a CT abdomen STAT.  Abdominal Pain This is a new problem. The current episode started 1 to 4 weeks ago. The problem occurs intermittently. The problem has been gradually improving. The pain is located in the LLQ. The pain is at a severity of 5/10. The pain is moderate. The quality of the pain is aching. The abdominal pain radiates to the left flank. Pertinent negatives include no belching, frequency, nausea or vomiting. Nothing aggravates the pain. The pain is relieved by Nothing.     Past Medical History:  Diagnosis Date   Anxiety    Arthritis    Complication of anesthesia    some type of sedation medication made her feel crazy   Dysrhythmia    Family history of adverse reaction to anesthesia    daughter has Malignant Hyperthermia hx -    GERD (gastroesophageal reflux disease)    Glaucoma    H/O: hysterectomy 1978   Heart disease    History of hiatal hernia    Hypertension    MVP (mitral valve prolapse)    Panic attacks    Pre-diabetes    Sciatica      Family History  Problem Relation Age of Onset   Pancreatic cancer Mother    Alcohol abuse Father    Asthma Sister    Hypertension Sister    Arthritis Sister    Arthritis Sister    Rheum arthritis Sister    Diabetes Brother    Arthritis Brother    Hyperlipidemia Brother    Hypertension Brother     Glaucoma Brother    Colon polyps Brother    Prostate cancer Brother    Congenital heart disease Brother    Throat cancer Brother    Alcohol abuse Brother    Healthy Daughter    Healthy Son    Colon cancer Neg Hx    Esophageal cancer Neg Hx    Inflammatory bowel disease Neg Hx    Liver disease Neg Hx    Rectal cancer Neg Hx    Stomach cancer Neg Hx      Current Outpatient Medications:    acetaminophen (TYLENOL) 500 MG tablet, Take 500-1,000 mg by mouth every 6 (six) hours as needed for moderate pain., Disp: , Rfl:    ALPRAZolam (XANAX) 0.25 MG tablet, Take 1 tablet (0.25 mg total) by mouth daily as needed for anxiety., Disp: 30 tablet, Rfl: 0   aluminum-magnesium hydroxide 200-200 MG/5ML suspension, Take 5 mLs by mouth every 6 (six) hours as needed for indigestion., Disp: , Rfl:    ascorbic acid (VITAMIN C) 500 MG tablet, Take 500 mg by mouth daily., Disp: , Rfl:    aspirin 81 MG chewable tablet, Chew 81 mg by mouth daily., Disp: , Rfl:    benzonatate (TESSALON) 100 MG capsule, TAKE 1 CAPSULE BY  MOUTH THREE TIMES A DAY AS NEEDED FOR COUGH, Disp: 60 capsule, Rfl: 1   Biotin 5 MG CAPS, Take 5 mg by mouth daily., Disp: , Rfl:    calcium carbonate (OSCAL) 1500 (600 Ca) MG TABS tablet, Take 600 mg of elemental calcium by mouth daily., Disp: , Rfl:    cholecalciferol (VITAMIN D3) 25 MCG (1000 UNIT) tablet, Take 3,000 Units by mouth daily., Disp: , Rfl:    Cyanocobalamin (VITAMIN B 12) 100 MCG LOZG, Take 1 tablet by mouth daily., Disp: , Rfl:    diltiazem (CARDIZEM) 30 MG tablet, Take 1 tablet (30 mg total) by mouth 2 (two) times daily., Disp: 200 tablet, Rfl: 2   dorzolamide-timolol (COSOPT) 22.3-6.8 MG/ML ophthalmic solution, Apply 1 drop to eye in the morning and at bedtime., Disp: , Rfl:    Evolocumab (REPATHA SURECLICK) 140 MG/ML SOAJ, Inject 140 mg into the skin every 14 (fourteen) days., Disp: 6 mL, Rfl: 1   fluticasone (FLONASE) 50 MCG/ACT nasal spray, Place 1 spray into both nostrils  daily., Disp: 16 g, Rfl: 0   guaiFENesin (MUCINEX) 600 MG 12 hr tablet, Take 1 tablet (600 mg total) by mouth 2 (two) times daily as needed., Disp: 60 tablet, Rfl: 1   hydrochlorothiazide (HYDRODIURIL) 25 MG tablet, TAKE 1 TABLET BY MOUTH DAILY, Disp: 100 tablet, Rfl: 2   hydrocortisone (ANUSOL-HC) 25 MG suppository, Place 1 suppository (25 mg total) rectally 2 (two) times daily as needed for hemorrhoids or anal itching., Disp: 24 suppository, Rfl: 0   ibuprofen (ADVIL) 200 MG tablet, Take 200-400 mg by mouth every 6 (six) hours as needed for moderate pain., Disp: , Rfl:    latanoprost (XALATAN) 0.005 % ophthalmic solution, Place 1 drop into both eyes at bedtime. , Disp: , Rfl:    loratadine (CLARITIN REDITABS) 10 MG dissolvable tablet, Take 10 mg by mouth daily as needed for allergies., Disp: , Rfl:    metoprolol tartrate (LOPRESSOR) 100 MG tablet, Take one tablet two hours prior to cardiac CTA., Disp: 1 tablet, Rfl: 0   Multiple Vitamins-Minerals (CENTRUM SILVER 50+WOMEN PO), Take 1 tablet by mouth daily., Disp: , Rfl:    omeprazole (PRILOSEC) 20 MG capsule, Take 20 mg by mouth 2 (two) times daily before a meal., Disp: , Rfl:    polyethylene glycol (MIRALAX / GLYCOLAX) packet, Take 17 g by mouth daily as needed for moderate constipation., Disp: , Rfl:    Probiotic Product (ALIGN PO), Take 1 capsule by mouth daily., Disp: , Rfl:    RHOPRESSA 0.02 % SOLN, Place 1 drop into both eyes every evening., Disp: , Rfl:    vitamin E 180 MG (400 UNITS) capsule, Take 400 Units by mouth daily., Disp: , Rfl:    Allergies  Allergen Reactions   Shellfish Allergy Anaphylaxis and Shortness Of Breath   Atorvastatin     Myalgias    Atracurium & Derivatives Other (See Comments)    Unknown   Atropine Other (See Comments)    Heart racing   Iodine Other (See Comments)    Unknown   Statins     "didn't feel right"      Review of Systems  Gastrointestinal:  Positive for abdominal pain and rectal pain. Negative  for nausea and vomiting.  Genitourinary:  Positive for dyspareunia and pelvic pain. Negative for frequency.     Today's Vitals   02/20/23 1038  BP: 120/70  Pulse: 75  Temp: 98.1 F (36.7 C)  Weight: 150 lb (68 kg)  Height: 5\' 2"  (1.575 m)  PainSc: 5   PainLoc: Abdomen   Body mass index is 27.44 kg/m.  Wt Readings from Last 3 Encounters:  02/20/23 150 lb (68 kg)  02/03/23 153 lb 9.6 oz (69.7 kg)  11/26/22 153 lb (69.4 kg)    The 10-year ASCVD risk score (Arnett DK, et al., 2019) is: 27%   Values used to calculate the score:     Age: 51 years     Sex: Female     Is Non-Hispanic African American: Yes     Diabetic: Yes     Tobacco smoker: No     Systolic Blood Pressure: 120 mmHg     Is BP treated: Yes     HDL Cholesterol: 63 mg/dL     Total Cholesterol: 163 mg/dL  Objective:  Physical Exam Constitutional:      Appearance: Normal appearance.  Cardiovascular:     Rate and Rhythm: Normal rate and regular rhythm.     Pulses: Normal pulses.     Heart sounds: Normal heart sounds.  Pulmonary:     Effort: Pulmonary effort is normal.     Breath sounds: Normal breath sounds.  Abdominal:     General: Bowel sounds are normal.     Tenderness: There is abdominal tenderness in the left lower quadrant. There is no right CVA tenderness or left CVA tenderness.  Neurological:     General: No focal deficit present.     Mental Status: She is alert.  Psychiatric:        Mood and Affect: Mood normal.        Behavior: Behavior normal.         Assessment And Plan:  Left lower quadrant abdominal pain -     POCT urinalysis dipstick -     CT ABDOMEN PELVIS W WO CONTRAST; Future    Return if symptoms worsen or fail to improve.  Patient was given opportunity to ask questions. Patient verbalized understanding of the plan and was able to repeat key elements of the plan. All questions were answered to their satisfaction.    I, Terri Hose, NP, have reviewed all documentation for this  visit. The documentation on 02/20/23 for the exam, diagnosis, procedures, and orders are all accurate and complete.   IF YOU HAVE BEEN REFERRED TO A SPECIALIST, IT MAY TAKE 1-2 WEEKS TO SCHEDULE/PROCESS THE REFERRAL. IF YOU HAVE NOT HEARD FROM US/SPECIALIST IN TWO WEEKS, PLEASE GIVE [place] A CALL AT [phone].

## 2023-02-21 ENCOUNTER — Encounter (HOSPITAL_BASED_OUTPATIENT_CLINIC_OR_DEPARTMENT_OTHER): Payer: Self-pay | Admitting: Cardiovascular Disease

## 2023-02-27 ENCOUNTER — Encounter: Payer: Self-pay | Admitting: Physician Assistant

## 2023-02-27 ENCOUNTER — Ambulatory Visit: Payer: Medicare Other | Attending: Physician Assistant | Admitting: Physician Assistant

## 2023-02-27 VITALS — BP 127/72 | HR 76 | Resp 13 | Ht 63.0 in | Wt 152.2 lb

## 2023-02-27 DIAGNOSIS — G4733 Obstructive sleep apnea (adult) (pediatric): Secondary | ICD-10-CM | POA: Diagnosis not present

## 2023-02-27 DIAGNOSIS — I700 Atherosclerosis of aorta: Secondary | ICD-10-CM

## 2023-02-27 DIAGNOSIS — R7303 Prediabetes: Secondary | ICD-10-CM

## 2023-02-27 DIAGNOSIS — M1712 Unilateral primary osteoarthritis, left knee: Secondary | ICD-10-CM

## 2023-02-27 DIAGNOSIS — M1612 Unilateral primary osteoarthritis, left hip: Secondary | ICD-10-CM | POA: Diagnosis not present

## 2023-02-27 DIAGNOSIS — I10 Essential (primary) hypertension: Secondary | ICD-10-CM

## 2023-02-27 DIAGNOSIS — Z8639 Personal history of other endocrine, nutritional and metabolic disease: Secondary | ICD-10-CM

## 2023-02-27 DIAGNOSIS — M170 Bilateral primary osteoarthritis of knee: Secondary | ICD-10-CM | POA: Diagnosis not present

## 2023-02-27 DIAGNOSIS — K219 Gastro-esophageal reflux disease without esophagitis: Secondary | ICD-10-CM | POA: Diagnosis not present

## 2023-02-27 DIAGNOSIS — J849 Interstitial pulmonary disease, unspecified: Secondary | ICD-10-CM

## 2023-02-27 DIAGNOSIS — T466X5A Adverse effect of antihyperlipidemic and antiarteriosclerotic drugs, initial encounter: Secondary | ICD-10-CM

## 2023-02-27 DIAGNOSIS — M19071 Primary osteoarthritis, right ankle and foot: Secondary | ICD-10-CM | POA: Diagnosis not present

## 2023-02-27 DIAGNOSIS — M25462 Effusion, left knee: Secondary | ICD-10-CM | POA: Diagnosis not present

## 2023-02-27 DIAGNOSIS — I341 Nonrheumatic mitral (valve) prolapse: Secondary | ICD-10-CM | POA: Diagnosis not present

## 2023-02-27 DIAGNOSIS — K449 Diaphragmatic hernia without obstruction or gangrene: Secondary | ICD-10-CM

## 2023-02-27 DIAGNOSIS — G720 Drug-induced myopathy: Secondary | ICD-10-CM

## 2023-02-27 DIAGNOSIS — M7062 Trochanteric bursitis, left hip: Secondary | ICD-10-CM | POA: Diagnosis not present

## 2023-02-27 DIAGNOSIS — M19072 Primary osteoarthritis, left ankle and foot: Secondary | ICD-10-CM

## 2023-02-27 NOTE — Addendum Note (Signed)
Addended by: Ellen Henri on: 02/27/2023 04:42 PM   Modules accepted: Orders

## 2023-02-28 ENCOUNTER — Ambulatory Visit
Admission: RE | Admit: 2023-02-28 | Discharge: 2023-02-28 | Disposition: A | Discharge status: Home / Self Care | Payer: Medicare Other | Source: Ambulatory Visit | Attending: Family Medicine | Admitting: Family Medicine

## 2023-02-28 DIAGNOSIS — R1032 Left lower quadrant pain: Secondary | ICD-10-CM

## 2023-03-06 ENCOUNTER — Ambulatory Visit (HOSPITAL_COMMUNITY)
Admission: RE | Admit: 2023-03-06 | Discharge: 2023-03-06 | Disposition: A | Discharge status: Home / Self Care | Payer: Medicare Other | Source: Ambulatory Visit | Attending: Physician Assistant | Admitting: Physician Assistant

## 2023-03-06 DIAGNOSIS — S83242A Other tear of medial meniscus, current injury, left knee, initial encounter: Secondary | ICD-10-CM | POA: Diagnosis not present

## 2023-03-06 DIAGNOSIS — M1712 Unilateral primary osteoarthritis, left knee: Secondary | ICD-10-CM | POA: Diagnosis not present

## 2023-03-06 DIAGNOSIS — R609 Edema, unspecified: Secondary | ICD-10-CM | POA: Diagnosis not present

## 2023-03-06 DIAGNOSIS — M25462 Effusion, left knee: Secondary | ICD-10-CM | POA: Diagnosis not present

## 2023-03-06 DIAGNOSIS — M948X6 Other specified disorders of cartilage, lower leg: Secondary | ICD-10-CM | POA: Diagnosis not present

## 2023-03-10 ENCOUNTER — Telehealth: Payer: Self-pay

## 2023-03-10 MED ORDER — PREDNISONE 50 MG PO TABS: ORAL_TABLET | ORAL | 0 refills | Status: DC

## 2023-03-10 NOTE — Telephone Encounter (Signed)
Phone call to patient to review instructions for 13 hr prep for CT w/ contrast on 03/14/23  at 12:40PM. Prescription called into [facility]. Pt aware and verbalized understanding of instructions. Prescription: 03/13/23 @ 11:40PM- 50mg  Prednisone 03/14/23 @5 :40AM- 50mg  Prednisone 03/14/23 @ 11:40PM- 50mg  Prednisone and 50mg  Benadryl

## 2023-03-14 ENCOUNTER — Inpatient Hospital Stay: Admission: RE | Admit: 2023-03-14 | Payer: Medicare Other | Source: Ambulatory Visit

## 2023-03-17 ENCOUNTER — Inpatient Hospital Stay: Admission: RE | Admit: 2023-03-17 | Payer: Medicare Other | Source: Ambulatory Visit

## 2023-03-20 NOTE — Progress Notes (Signed)
I called the patient to discuss MRI results.   Oblique tear of posterior horn of meniscus noted--discussed that it is unclear if this is a new tear-pt had previous arthroscopic repair---please forward results to surgeon at [facility].   Advised patient to call to schedule an appointment with her orthopedist to discuss results and the next steps in treatment

## 2023-03-24 ENCOUNTER — Ambulatory Visit
Admission: RE | Admit: 2023-03-24 | Discharge: 2023-03-24 | Disposition: A | Discharge status: Home / Self Care | Payer: Medicare Other | Source: Ambulatory Visit | Attending: Nurse Practitioner | Admitting: Nurse Practitioner

## 2023-03-24 DIAGNOSIS — Z1231 Encounter for screening mammogram for malignant neoplasm of breast: Secondary | ICD-10-CM | POA: Diagnosis not present

## 2023-03-31 ENCOUNTER — Ambulatory Visit
Admission: RE | Admit: 2023-03-31 | Discharge: 2023-03-31 | Disposition: A | Discharge status: Home / Self Care | Payer: Medicare Other | Source: Ambulatory Visit | Attending: Family Medicine | Admitting: Family Medicine

## 2023-03-31 DIAGNOSIS — Z9089 Acquired absence of other organs: Secondary | ICD-10-CM | POA: Diagnosis not present

## 2023-03-31 DIAGNOSIS — R1032 Left lower quadrant pain: Secondary | ICD-10-CM | POA: Diagnosis not present

## 2023-03-31 MED ORDER — IOPAMIDOL (ISOVUE-300) INJECTION 61%
500.0000 mL | Freq: Once | INTRAVENOUS | Status: AC | PRN
  Administered 2023-03-31: 100 mL via INTRAVENOUS

## 2023-04-02 ENCOUNTER — Other Ambulatory Visit: Payer: Self-pay | Admitting: Family Medicine

## 2023-04-02 DIAGNOSIS — K5909 Other constipation: Secondary | ICD-10-CM

## 2023-04-02 MED ORDER — POLYETHYLENE GLYCOL 3350 17 G PO PACK: 17.0000 g | PACK | Freq: Every day | ORAL | 1 refills | Status: AC | PRN

## 2023-04-10 ENCOUNTER — Ambulatory Visit: Payer: Medicare Other | Admitting: Physician Assistant

## 2023-05-02 ENCOUNTER — Encounter: Payer: Self-pay | Admitting: Gastroenterology

## 2023-05-02 ENCOUNTER — Ambulatory Visit: Payer: Medicare Other | Admitting: Gastroenterology

## 2023-05-02 VITALS — BP 120/70 | HR 84 | Ht 62.5 in | Wt 152.0 lb

## 2023-05-02 DIAGNOSIS — R140 Abdominal distension (gaseous): Secondary | ICD-10-CM | POA: Diagnosis not present

## 2023-05-02 DIAGNOSIS — K649 Unspecified hemorrhoids: Secondary | ICD-10-CM

## 2023-05-02 DIAGNOSIS — K5900 Constipation, unspecified: Secondary | ICD-10-CM | POA: Diagnosis not present

## 2023-05-02 NOTE — Patient Instructions (Signed)
Toileting tips to help with your constipation - Drink at least 64-80 ounces of water/liquid per day. - Establish a time to try to move your bowels every day.  For many people, this is after a cup of coffee or after a meal such as breakfast. - Sit all of the way back on the toilet keeping your back fairly straight and while sitting up, try to rest the tops of your forearms on your upper thighs.   - Raising your feet with a step stool/squatty potty can be helpful to improve the angle that allows your stool to pass through the rectum. - Relax the rectum feeling it bulge toward the toilet water.  If you feel your rectum raising toward your body, you are contracting rather than relaxing. - Breathe in and slowly exhale. "Belly breath" by expanding your belly towards your belly button. Keep belly expanded as you gently direct pressure down and back to the anus.  A low pitched GRRR sound can assist with increasing intra-abdominal pressure.  - Repeat 3-4 times. If unsuccessful, contract the pelvic floor to restore normal tone and get off the toilet.  Avoid excessive straining. - To reduce excessive wiping by teaching your anus to normally contract, place hands on outer aspect of knees and resist knee movement outward.  Hold 5-10 second then place hands just inside of knees and resist inward movement of knees.  Hold 5 seconds.  Repeat a few times each way.  Start Anusol Suppositories nightly for 1 week, then use every other night until completion of prescription.   Try to increase Miralax to twice daily, if no improvement after 1 week then trial samples of Linzess.   We have given you samples of the following medication to take: Linzess 145 mcg - Take 1 capsules once daily.   Send my chart message in 1-2 weeks with updates on symptoms. Consider Hemorrhoid Banding if no improvement. See handout.   _______________________________________________________  If your blood pressure at your visit was 140/90 or  greater, please contact your primary care physician to follow up on this.  _______________________________________________________  If you are age 76 or older, your body mass index should be between 23-30. Your Body mass index is 27.36 kg/m. If this is out of the aforementioned range listed, please consider follow up with your Primary Care Provider.  If you are age 67 or younger, your body mass index should be between 19-25. Your Body mass index is 27.36 kg/m. If this is out of the aformentioned range listed, please consider follow up with your Primary Care Provider.   ________________________________________________________  The [facility] providers would like to encourage you to use [facility] to communicate with providers for non-urgent requests or questions.  Due to long hold times on the telephone, sending your provider a message by [facility] may be a faster and more efficient way to get a response.  Please allow 48 business hours for a response.  Please remember that this is for non-urgent requests.  _______________________________________________________  Thank you for choosing me and [facility].  Dr. Meridee Score

## 2023-05-02 NOTE — Progress Notes (Signed)
GASTROENTEROLOGY OUTPATIENT CLINIC VISIT   Primary Care Provider Arnette Felts, FNP [address] [place] [address] [phone]  Patient Profile: Terri Barajas is a 77 y.o. female with a pmh significant for HTN, Glaucoma, MVP, GERD, previous diagnosis Barrett's esophagus (negative on recent 2020 and 2023 EGD), SIBO (previously diagnosed via breath test), constipation, hemorrhoids.  The patient presents to the [facility] for an evaluation and management of problem(s) noted below:  Problem List 1. Hemorrhoids, unspecified hemorrhoid type   2. Constipation, unspecified constipation type   3. Bloating    Discussed the use of AI scribe software for clinical note transcription with the patient, who gave verbal consent to proceed.  History of Present Illness Please see prior notes for full details of HPI.  Interval History The patient presents for follow-up.  Currently, she is dealing with anorectal discomfort and irritation from her hemorrhoids.  She reports a sensation of something "popping out" during bowel movements, which she describes as highly irritating. Despite attempts to manage the discomfort with Sitz baths and over-the-counter hemorrhoid treatments, the symptoms persist.  She recounts having had hemorrhoids banded in the past when she lived elsewhere (she is unclear if this made any difference in symptoms like she is having however).   She has not used any of the prescribed suppository therapy (by her PCP) for her current symptoms, despite having Anusol steroid suppositories.  [name] last colonoscopy showed evidence of both internal/external hemorrhoids.  She does not have a severe pain when the bowel passes, however.  She continues to experience abdominal bloating and abdominal distention.  She underwent a CT scan recently (full report below) but there was evidence of a significant stool burden in her colon.  She takes MiraLAX once  daily for constipation, but the bloating and discomfort persist.  She has not been on other laxative therapies either.   GI Review of Systems Positive as above Negative for dysphagia, odynophagia, nausea, vomiting, melena  Review of Systems General: Denies fevers/chills/unintentional weight loss Cardiovascular: Denies current chest pain Pulmonary: Denies shortness of breath Gastroenterological: See HPI Genitourinary: Denies darkened urine Hematological: Denies easy bruising Skin: Denies jaundice Psychological: Mood is stable   Medications Current Outpatient Medications  Medication Sig Dispense Refill   acetaminophen (TYLENOL) 500 MG tablet Take 500-1,000 mg by mouth every 6 (six) hours as needed for moderate pain.     ALPRAZolam (XANAX) 0.25 MG tablet Take 1 tablet (0.25 mg total) by mouth daily as needed for anxiety. 30 tablet 0   ascorbic acid (VITAMIN C) 500 MG tablet Take 500 mg by mouth daily.     aspirin 81 MG chewable tablet Chew 81 mg by mouth daily.     benzonatate (TESSALON) 100 MG capsule TAKE 1 CAPSULE BY MOUTH THREE TIMES A DAY AS NEEDED FOR COUGH 60 capsule 1   Biotin 5 MG CAPS Take 5 mg by mouth daily.     calcium carbonate (OSCAL) 1500 (600 Ca) MG TABS tablet Take 600 mg of elemental calcium by mouth daily.     cholecalciferol (VITAMIN D3) 25 MCG (1000 UNIT) tablet Take 3,000 Units by mouth daily.     Cyanocobalamin (VITAMIN B 12) 100 MCG LOZG Take 1 tablet by mouth daily.     diltiazem (CARDIZEM) 30 MG tablet Take 1 tablet (30 mg total) by mouth 2 (two) times daily. 200 tablet 2   dorzolamide-timolol (COSOPT) 22.3-6.8 MG/ML ophthalmic solution Apply 1 drop to eye in the morning and at  bedtime.     Evolocumab (REPATHA SURECLICK) 140 MG/ML SOAJ Inject 140 mg into the skin every 14 (fourteen) days. 6 mL 1   fluticasone (FLONASE) 50 MCG/ACT nasal spray Place 1 spray into both nostrils daily. 16 g 0   guaiFENesin (MUCINEX) 600 MG 12 hr tablet Take 1 tablet (600 mg total)  by mouth 2 (two) times daily as needed. 60 tablet 1   hydrochlorothiazide (HYDRODIURIL) 25 MG tablet TAKE 1 TABLET BY MOUTH DAILY 100 tablet 2   hydrocortisone (ANUSOL-HC) 25 MG suppository Place 1 suppository (25 mg total) rectally 2 (two) times daily as needed for hemorrhoids or anal itching. 24 suppository 0   ibuprofen (ADVIL) 200 MG tablet Take 200-400 mg by mouth every 6 (six) hours as needed for moderate pain.     latanoprost (XALATAN) 0.005 % ophthalmic solution Place 1 drop into both eyes at bedtime.      loratadine (CLARITIN REDITABS) 10 MG dissolvable tablet Take 10 mg by mouth daily as needed for allergies.     Multiple Vitamins-Minerals (CENTRUM SILVER 50+WOMEN PO) Take 1 tablet by mouth daily.     omeprazole (PRILOSEC) 20 MG capsule Take 20 mg by mouth 2 (two) times daily before a meal.     polyethylene glycol (MIRALAX / GLYCOLAX) 17 g packet Take 17 g by mouth daily as needed for moderate constipation. 14 each 1   Probiotic Product (ALIGN PO) Take 1 capsule by mouth daily.     RHOPRESSA 0.02 % SOLN Place 1 drop into both eyes every evening.     vitamin E 180 MG (400 UNITS) capsule Take 400 Units by mouth daily.     No current facility-administered medications for this visit.   Allergies Allergies  Allergen Reactions   Shellfish Allergy Anaphylaxis and Shortness Of Breath   Atorvastatin     Myalgias    Atracurium & Derivatives Other (See Comments)    Unknown   Atropine Other (See Comments)    Heart racing   Iodinated Contrast Media    Iodine Other (See Comments)    Unknown   Statins     "didn't feel right"    Histories Past Medical History:  Diagnosis Date   Anxiety    Arthritis    Complication of anesthesia    some type of sedation medication made her feel crazy   Dysrhythmia    Family history of adverse reaction to anesthesia    daughter has Malignant Hyperthermia hx -    GERD (gastroesophageal reflux disease)    Glaucoma    H/O: hysterectomy 1978   Heart  disease    History of hiatal hernia    Hypertension    MVP (mitral valve prolapse)    Panic attacks    Pre-diabetes    Sciatica    Past Surgical History:  Procedure Laterality Date   APPENDECTOMY  1972   BACK SURGERY  07/14/2020   chipped disc   BIOPSY  07/06/2018   Procedure: BIOPSY;  Surgeon: Lemar Lofty., MD;  Location: [facility] ENDOSCOPY;  Service: Gastroenterology;;   BIOPSY  03/04/2022   Procedure: BIOPSY;  Surgeon: Lemar Lofty., MD;  Location: [facility] ENDOSCOPY;  Service: Gastroenterology;;   CATARACT EXTRACTION Bilateral 2013   CESAREAN SECTION  1976 and 1978   COLONOSCOPY     COLONOSCOPY WITH PROPOFOL N/A 03/04/2022   Procedure: COLONOSCOPY WITH PROPOFOL;  Surgeon: Lemar Lofty., MD;  Location: [facility] ENDOSCOPY;  Service: Gastroenterology;  Laterality: N/A;   ESOPHAGOGASTRODUODENOSCOPY (EGD) WITH PROPOFOL  N/A 07/06/2018   Procedure: ESOPHAGOGASTRODUODENOSCOPY (EGD) WITH PROPOFOL;  Surgeon: Meridee Score Netty Starring., MD;  Location: [facility] ENDOSCOPY;  Service: Gastroenterology;  Laterality: N/A;   ESOPHAGOGASTRODUODENOSCOPY (EGD) WITH PROPOFOL N/A 03/04/2022   Procedure: ESOPHAGOGASTRODUODENOSCOPY (EGD) WITH PROPOFOL;  Surgeon: Meridee Score Netty Starring., MD;  Location: [facility] ENDOSCOPY;  Service: Gastroenterology;  Laterality: N/A;   HAND SURGERY Bilateral    HEMORRHOID SURGERY  2018   KNEE CARTILAGE SURGERY Left 01/28/2022   PLANTAR FASCIA RELEASE Right    POLYPECTOMY  03/04/2022   Procedure: POLYPECTOMY;  Surgeon: Meridee Score Netty Starring., MD;  Location: [facility] ENDOSCOPY;  Service: Gastroenterology;;   S/P Hysterectomy   1987   SAVORY DILATION N/A 03/04/2022   Procedure: Gaspar Bidding DILATION;  Surgeon: Lemar Lofty., MD;  Location: [facility] ENDOSCOPY;  Service: Gastroenterology;  Laterality: N/A;   Social History   Socioeconomic History   Marital status: Married    Spouse name: Not on file   Number of children: Not on file   Years of education: 12   Highest  education level: Not on file  Occupational History   Occupation: retired  Tobacco Use   Smoking status: Never    Passive exposure: Never   Smokeless tobacco: Never  Vaping Use   Vaping status: Never Used  Substance and Sexual Activity   Alcohol use: Never   Drug use: Never   Sexual activity: Yes    Partners: Male  Other Topics Concern   Not on file  Social History Narrative   Not on file   Social Determinants of Health   Financial Resource Strain: Low Risk  (05/02/2022)   Overall Financial Resource Strain (CARDIA)    Difficulty of Paying Living Expenses: Not hard at all  Food Insecurity: No Food Insecurity (08/19/2022)   Hunger Vital Sign    Worried About Running Out of Food in the Last Year: Never true    Ran Out of Food in the Last Year: Never true  Transportation Needs: No Transportation Needs (08/19/2022)   PRAPARE - Administrator, Civil Service (Medical): No    Lack of Transportation (Non-Medical): No  Physical Activity: Insufficiently Active (05/02/2022)   Exercise Vital Sign    Days of Exercise per Week: 3 days    Minutes of Exercise per Session: 30 min  Stress: No Stress Concern Present (05/02/2022)   [employer] of Occupational Health - Occupational Stress Questionnaire    Feeling of Stress : Not at all  Social Connections: Not on file  Intimate Partner Violence: Not on file   Family History  Problem Relation Age of Onset   Pancreatic cancer Mother    Alcohol abuse Father    Asthma Sister    Hypertension Sister    Arthritis Sister    Arthritis Sister    Rheum arthritis Sister    Diabetes Brother    Arthritis Brother    Hyperlipidemia Brother    Hypertension Brother    Glaucoma Brother    Colon polyps Brother    Prostate cancer Brother    Congenital heart disease Brother    Throat cancer Brother    Alcohol abuse Brother    Healthy Daughter    Healthy Son    Colon cancer Neg Hx    Esophageal cancer Neg Hx    Inflammatory bowel  disease Neg Hx    Liver disease Neg Hx    Rectal cancer Neg Hx    Stomach cancer Neg Hx    I have reviewed her medical, social,  and family history in detail and updated the electronic medical record as necessary.    PHYSICAL EXAMINATION  BP 120/70 (BP Location: Left Arm, Patient Position: Sitting, Cuff Size: Normal)   Pulse 84   Ht 5' 2.5" (1.588 m) Comment: height measured without shoes  Wt 152 lb (68.9 kg)   LMP  (LMP Unknown)   BMI 27.36 kg/m  GEN: NAD, appears stated age, doesn't appear chronically ill PSYCH: Cooperative, without pressured speech EYE: Conjunctivae pink, sclerae anicteric ENT: MMM CV: Nontachycardic RESP: No audible wheezing GI: NABS, soft, protuberant lower abdomen, nontender, without rebound or guarding MSK/EXT: No lower extremity edema SKIN: No jaundice NEURO:  Alert & Oriented x 3, no focal deficits   REVIEW OF DATA  I reviewed the following data at the time of this encounter:  GI Procedures and Studies  2023 Colonoscopy - Hemorrhoids found on digital rectal exam. - There was significant looping and tortuosity of the colon. - Four, 2 to 5 mm polyps in the rectum, at the recto-sigmoid colon and in the sigmoid colon, removed with a cold snare. Resected and retrieved. - Normal mucosa in the entire examined colon otherwise. - Non-bleeding non-thrombosed internal hemorrhoids.  2023 EGD - No gross lesions in esophagus. Z-line irregular, 36 cm from the incisors. - 1 cm hiatal hernia. - Savory dilation performed in the esophagus. - Multiple gastric polyps - fundic gland. - Enlarged gastric fold versus polyp - biopsied. - Erythematous mucosa in the antrum. No other gross lesions in the stomach. Biopsied. - No gross lesions in the duodenal bulb, in the first portion of the duodenum and in the second portion of the duodenum.  Pathology FINAL MICROSCOPIC DIAGNOSIS:  A. PREPYLORIC FOLD, BIOPSY:  Reactive gastropathy with foveolar hyperplasia compatible with   hyperplastic polyp  Negative for H. pylori, intestinal metaplasia, dysplasia and carcinoma  B. STOMACH, RANDOM, BIOPSY:  Reactive gastropathy with foveolar hyperplasia  Negative for H. pylori, intestinal metaplasia, dysplasia and carcinoma  C. ESOPHAGUS, DISTAL, BIOPSY:  Reactive squamous mucosa  Chronic gastritis  Focal benign pancreatic metaplasia  Negative for intestinal metaplasia, dysplasia and carcinoma  D.  SIGMOID AND RECTOSIGMOID COLON AND RECTUM, POLYPECTOMY:  Hyperplastic polyp  Negative for dysplasia and carcinoma   Laboratory Studies  Reviewed in EPIC  Imaging Studies  10/24 CTAP IMPRESSION: No acute findings within the abdomen or pelvis.  Large stool burden noted; recommend clinical correlation for possible constipation.   ASSESSMENT  Ms. Ohora is a 77 y.o. female with a pmh significant for HTN, Glaucoma, MVP, GERD, previous diagnosis Barrett's esophagus (negative on recent 2020 and 2023 EGD), SIBO (previously diagnosed via breath test), constipation, hemorrhoids.  The patient is seen today for a return visit for evaluation and management of:  1. Hemorrhoids, unspecified hemorrhoid type   2. Constipation, unspecified constipation type   3. Bloating    The patient is hemodynamically stable.  Clinically, she continues to have some GI issues in regards to her bloating that remain an issue to tackle.  Her biggest issue currently is her hemorrhoids.  Discussed the potential role of Anusol suppositories which she already has at home in an effort of trying to see if that will help with the prolapse and itching and irritation.  If it does then we may have more on our plate to discuss internal hemorrhoidal banding attempt for her (she has had this years ago elsewhere).  Versus is this her external hemorrhoids that are causing her more issues.  She will continue sitz bath's  and may use Preparation H as well.  Toileting techniques discussed with her as well.  If we are able  to optimize her constipation as well maybe this will help in the long-term to minimize any straining (though she states she is not really straining at this point).  Her imaging performed recently had shown evidence of a large stool burden within the colon.  Our plan is to increase MiraLAX to twice daily to see how she does over the course of the next few weeks, if she does not have improvement then should be able to have Linzess samples that she may use see if that is helpful for her in the long-term at which point we can work on giving her a prescription if needed.  Her bloating symptoms still are likely multifactorial.  She has a history of SIBO based on previous breath testing but did not have significant improvement with treatment for that.  May be if we move her bowels more frequently her bloating symptoms and distention will improve as well, so we will have to see about that.  Can also consider repeat SIBO breath testing versus repeat SIBO treatment.  All patient questions were answered to the best of my ability, and the patient agrees to the aforementioned plan of action with follow-up as indicated.   PLAN  Continue as needed simethicone Consider repeat SIBO treatment Continue PPI at current dosing Increase MiraLAX to twice daily - If not helpful can use samples of Linzess that were given in effort of trying to optimize bowel habits Toileting techniques discussed Anusol suppositories to be used - Nightly for a week and then every other night until prescription is done - If improvement in symptoms, then this may build story for [place] to consider internal hemorrhoidal banding   No orders of the defined types were placed in this encounter.   New Prescriptions   No medications on file   Modified Medications   No medications on file    Planned Follow Up: Return in about 6 months (around 10/30/2023).   Total Time in Face-to-Face and in Coordination of Care for patient including  independent/personal interpretation/review of prior testing, medical history, examination, medication adjustment, communicating results with the patient directly, and documentation with the EHR is 25 minutes.  Corliss Parish, MD [facility] Office # [phone]

## 2023-05-05 ENCOUNTER — Encounter: Payer: Self-pay | Admitting: Gastroenterology

## 2023-05-09 ENCOUNTER — Encounter: Payer: Self-pay | Admitting: Gastroenterology

## 2023-05-20 ENCOUNTER — Encounter: Payer: Self-pay | Admitting: Gastroenterology

## 2023-05-21 NOTE — Telephone Encounter (Signed)
Per last office note if hemorrhoids no better then may consider banding. Do you want her to be set up?

## 2023-05-21 NOTE — Telephone Encounter (Signed)
Patty, I have discussed the case with Dr. Barron Alvine. Although she is not having rectal bleeding, the hemorrhoidal protrusion occurring is reasonable for attempt at hemorrhoidal banding. Please schedule this patient as a hemorrhoid banding case for him in clinic. Thanks. GM

## 2023-05-29 ENCOUNTER — Ambulatory Visit: Payer: Medicare Other | Admitting: Nurse Practitioner

## 2023-05-29 ENCOUNTER — Ambulatory Visit: Payer: Medicare Other

## 2023-05-29 ENCOUNTER — Encounter: Payer: Self-pay | Admitting: Nurse Practitioner

## 2023-05-29 VITALS — BP 118/64 | HR 73 | Temp 97.7°F | Ht 63.0 in | Wt 151.0 lb

## 2023-05-29 VITALS — BP 118/64 | HR 73 | Temp 97.7°F | Ht 63.0 in | Wt 151.6 lb

## 2023-05-29 DIAGNOSIS — M25562 Pain in left knee: Secondary | ICD-10-CM

## 2023-05-29 DIAGNOSIS — E782 Mixed hyperlipidemia: Secondary | ICD-10-CM

## 2023-05-29 DIAGNOSIS — R7303 Prediabetes: Secondary | ICD-10-CM

## 2023-05-29 DIAGNOSIS — T466X5A Adverse effect of antihyperlipidemic and antiarteriosclerotic drugs, initial encounter: Secondary | ICD-10-CM | POA: Diagnosis not present

## 2023-05-29 DIAGNOSIS — I10 Essential (primary) hypertension: Secondary | ICD-10-CM

## 2023-05-29 DIAGNOSIS — G4733 Obstructive sleep apnea (adult) (pediatric): Secondary | ICD-10-CM

## 2023-05-29 DIAGNOSIS — I700 Atherosclerosis of aorta: Secondary | ICD-10-CM

## 2023-05-29 DIAGNOSIS — J849 Interstitial pulmonary disease, unspecified: Secondary | ICD-10-CM

## 2023-05-29 DIAGNOSIS — G720 Drug-induced myopathy: Secondary | ICD-10-CM | POA: Diagnosis not present

## 2023-05-29 DIAGNOSIS — G8929 Other chronic pain: Secondary | ICD-10-CM

## 2023-05-29 DIAGNOSIS — Z0000 Encounter for general adult medical examination without abnormal findings: Secondary | ICD-10-CM | POA: Diagnosis not present

## 2023-05-29 MED ORDER — KETOROLAC TROMETHAMINE 30 MG/ML IJ SOLN
30.0000 mg | Freq: Once | INTRAMUSCULAR | Status: AC
Start: 2023-05-29 — End: 2023-05-29
  Administered 2023-05-29: 30 mg via INTRAMUSCULAR

## 2023-05-29 NOTE — Patient Instructions (Signed)
You can take ossicoccillinium in addition to vitamin c and zinc before getting your RSV vaccine

## 2023-05-29 NOTE — Progress Notes (Signed)
Subjective:   Terri Barajas is a 77 y.o. female who presents for Medicare Annual (Subsequent) preventive examination.  Visit Complete: In person    Cardiac Risk Factors include: advanced age (>37men, >62 women);dyslipidemia;hypertension     Objective:    Today's Vitals   05/29/23 0838 05/29/23 0839  BP: 118/64   Pulse: 73   Temp: 97.7 F (36.5 C)   TempSrc: Oral   SpO2: 99%   Weight: 151 lb 9.6 oz (68.8 kg)   Height: 5\' 3"  (1.6 m)   PainSc:  6    Body mass index is 26.85 kg/m.     05/29/2023    8:46 AM 08/16/2022    6:40 PM 05/02/2022    8:23 AM 03/04/2022    6:46 AM 04/26/2021    8:58 AM 05/18/2020    9:41 AM 09/28/2019    9:07 PM  Advanced Directives  Does Patient Have a Medical Advance Directive? Yes Yes Yes No Yes Yes No  Type of Estate agent of [place];Living will Healthcare Power of [employer] of [place];Living will  Healthcare Power of [place];Living will Healthcare Power of [place];Living will   Copy of Healthcare Power of Attorney in Chart? No - copy requested  No - copy requested  No - copy requested    Would patient like information on creating a medical advance directive?    No - Patient declined       Current Medications (verified) Outpatient Encounter Medications as of 05/29/2023  Medication Sig   acetaminophen (TYLENOL) 500 MG tablet Take 500-1,000 mg by mouth every 6 (six) hours as needed for moderate pain.   ALPRAZolam (XANAX) 0.25 MG tablet Take 1 tablet (0.25 mg total) by mouth daily as needed for anxiety.   ascorbic acid (VITAMIN C) 500 MG tablet Take 500 mg by mouth daily.   aspirin 81 MG chewable tablet Chew 81 mg by mouth daily.   benzonatate (TESSALON) 100 MG capsule TAKE 1 CAPSULE BY MOUTH THREE TIMES A DAY AS NEEDED FOR COUGH   Biotin 5 MG CAPS Take 5 mg by mouth daily.   calcium carbonate (OSCAL) 1500 (600 Ca) MG TABS tablet Take 600 mg of elemental calcium by mouth daily.   cholecalciferol  (VITAMIN D3) 25 MCG (1000 UNIT) tablet Take 3,000 Units by mouth daily.   diltiazem (CARDIZEM) 30 MG tablet Take 1 tablet (30 mg total) by mouth 2 (two) times daily.   dorzolamide-timolol (COSOPT) 22.3-6.8 MG/ML ophthalmic solution Apply 1 drop to eye in the morning and at bedtime.   Evolocumab (REPATHA SURECLICK) 140 MG/ML SOAJ Inject 140 mg into the skin every 14 (fourteen) days.   fluticasone (FLONASE) 50 MCG/ACT nasal spray Place 1 spray into both nostrils daily.   guaiFENesin (MUCINEX) 600 MG 12 hr tablet Take 1 tablet (600 mg total) by mouth 2 (two) times daily as needed.   hydrochlorothiazide (HYDRODIURIL) 25 MG tablet TAKE 1 TABLET BY MOUTH DAILY   hydrocortisone (ANUSOL-HC) 25 MG suppository Place 1 suppository (25 mg total) rectally 2 (two) times daily as needed for hemorrhoids or anal itching.   ibuprofen (ADVIL) 200 MG tablet Take 200-400 mg by mouth every 6 (six) hours as needed for moderate pain.   latanoprost (XALATAN) 0.005 % ophthalmic solution Place 1 drop into both eyes at bedtime.    loratadine (CLARITIN REDITABS) 10 MG dissolvable tablet Take 10 mg by mouth daily as needed for allergies.   Multiple Vitamins-Minerals (CENTRUM SILVER 50+WOMEN PO) Take 1 tablet by  mouth daily.   omeprazole (PRILOSEC) 20 MG capsule Take 20 mg by mouth 2 (two) times daily before a meal.   polyethylene glycol (MIRALAX / GLYCOLAX) 17 g packet Take 17 g by mouth daily as needed for moderate constipation.   Probiotic Product (ALIGN PO) Take 1 capsule by mouth daily.   RHOPRESSA 0.02 % SOLN Place 1 drop into both eyes every evening.   vitamin E 180 MG (400 UNITS) capsule Take 400 Units by mouth daily.   Cyanocobalamin (VITAMIN B 12) 100 MCG LOZG Take 1 tablet by mouth daily. (Patient not taking: Reported on 05/29/2023)   No facility-administered encounter medications on file as of 05/29/2023.    Allergies (verified) Shellfish allergy, Atorvastatin, Atracurium & derivatives, Atropine, Iodinated  contrast media, Iodine, and Statins   History: Past Medical History:  Diagnosis Date   Anxiety    Arthritis    Complication of anesthesia    some type of sedation medication made her feel crazy   Dysrhythmia    Family history of adverse reaction to anesthesia    daughter has Malignant Hyperthermia hx -    GERD (gastroesophageal reflux disease)    Glaucoma    H/O: hysterectomy 1978   Heart disease    History of hiatal hernia    Hypertension    MVP (mitral valve prolapse)    Panic attacks    Pre-diabetes    Sciatica    Past Surgical History:  Procedure Laterality Date   APPENDECTOMY  1972   BACK SURGERY  07/14/2020   chipped disc   BIOPSY  07/06/2018   Procedure: BIOPSY;  Surgeon: Lemar Lofty., MD;  Location: [facility]COPY;  Service: Gastroenterology;;   BIOPSY  03/04/2022   Procedure: BIOPSY;  Surgeon: Lemar Lofty., MD;  Location: [facility] ENDOSCOPY;  Service: Gastroenterology;;   CATARACT EXTRACTION Bilateral 2013   CESAREAN SECTION  1976 and 1978   COLONOSCOPY     COLONOSCOPY WITH PROPOFOL N/A 03/04/2022   Procedure: COLONOSCOPY WITH PROPOFOL;  Surgeon: Lemar Lofty., MD;  Location: [facility] ENDOSCOPY;  Service: Gastroenterology;  Laterality: N/A;   ESOPHAGOGASTRODUODENOSCOPY (EGD) WITH PROPOFOL N/A 07/06/2018   Procedure: ESOPHAGOGASTRODUODENOSCOPY (EGD) WITH PROPOFOL;  Surgeon: Meridee Score Netty Starring., MD;  Location: [facility] ENDOSCOPY;  Service: Gastroenterology;  Laterality: N/A;   ESOPHAGOGASTRODUODENOSCOPY (EGD) WITH PROPOFOL N/A 03/04/2022   Procedure: ESOPHAGOGASTRODUODENOSCOPY (EGD) WITH PROPOFOL;  Surgeon: Meridee Score Netty Starring., MD;  Location: [facility] ENDOSCOPY;  Service: Gastroenterology;  Laterality: N/A;   HAND SURGERY Bilateral    HEMORRHOID SURGERY  2018   KNEE CARTILAGE SURGERY Left 01/28/2022   PLANTAR FASCIA RELEASE Right    POLYPECTOMY  03/04/2022   Procedure: POLYPECTOMY;  Surgeon: Meridee Score Netty Starring., MD;  Location: [facility] ENDOSCOPY;   Service: Gastroenterology;;   S/P Hysterectomy   1987   SAVORY DILATION N/A 03/04/2022   Procedure: Gaspar Bidding DILATION;  Surgeon: Lemar Lofty., MD;  Location: [facility] ENDOSCOPY;  Service: Gastroenterology;  Laterality: N/A;   Family History  Problem Relation Age of Onset   Pancreatic cancer Mother    Alcohol abuse Father    Asthma Sister    Hypertension Sister    Arthritis Sister    Arthritis Sister    Rheum arthritis Sister    Diabetes Brother    Arthritis Brother    Hyperlipidemia Brother    Hypertension Brother    Glaucoma Brother    Colon polyps Brother    Prostate cancer Brother    Congenital heart disease Brother    Throat cancer  Brother    Alcohol abuse Brother    Healthy Daughter    Healthy Son    Colon cancer Neg Hx    Esophageal cancer Neg Hx    Inflammatory bowel disease Neg Hx    Liver disease Neg Hx    Rectal cancer Neg Hx    Stomach cancer Neg Hx    Social History   Socioeconomic History   Marital status: Married    Spouse name: Not on file   Number of children: Not on file   Years of education: 12   Highest education level: Not on file  Occupational History   Occupation: retired  Tobacco Use   Smoking status: Never    Passive exposure: Never   Smokeless tobacco: Never  Vaping Use   Vaping status: Never Used  Substance and Sexual Activity   Alcohol use: Never   Drug use: Never   Sexual activity: Yes    Partners: Male  Other Topics Concern   Not on file  Social History Narrative   Not on file   Social Drivers of Health   Financial Resource Strain: Low Risk  (05/29/2023)   Overall Financial Resource Strain (CARDIA)    Difficulty of Paying Living Expenses: Not hard at all  Food Insecurity: No Food Insecurity (05/29/2023)   Hunger Vital Sign    Worried About Running Out of Food in the Last Year: Never true    Ran Out of Food in the Last Year: Never true  Transportation Needs: No Transportation Needs (05/29/2023)   PRAPARE -  Administrator, Civil Service (Medical): No    Lack of Transportation (Non-Medical): No  Physical Activity: Inactive (05/29/2023)   Exercise Vital Sign    Days of Exercise per Week: 0 days    Minutes of Exercise per Session: 0 min  Stress: No Stress Concern Present (05/29/2023)   [employer] of Occupational Health - Occupational Stress Questionnaire    Feeling of Stress : Not at all  Social Connections: Moderately Integrated (05/29/2023)   Social Connection and Isolation Panel [NHANES]    Frequency of Communication with Friends and Family: More than three times a week    Frequency of Social Gatherings with Friends and Family: Not on file    Attends Religious Services: More than 4 times per year    Active Member of [employer] or Organizations: No    Attends Engineer, structural: Never    Marital Status: Married    Tobacco Counseling Counseling given: Not Answered   Clinical Intake:  Pre-visit preparation completed: Yes  Pain : 0-10 Pain Score: 6  Pain Type: Chronic pain Pain Location: Knee Pain Orientation: Left Pain Descriptors / Indicators: Aching Pain Onset: More than a month ago Pain Frequency: Constant     Nutritional Status: BMI 25 -29 Overweight Nutritional Risks: None Diabetes: No  How often do you need to have someone help you when you read instructions, pamphlets, or other written materials from your doctor or pharmacy?: 1 - Never  Interpreter Needed?: No  Information entered by :: NAllen LPN   Activities of Daily Living    05/29/2023    8:40 AM  In your present state of health, do you have any difficulty performing the following activities:  Hearing? 0  Vision? 0  Difficulty concentrating or making decisions? 0  Walking or climbing stairs? 1  Comment due to knee  Dressing or bathing? 0  Doing errands, shopping? 0  Preparing Food and eating ?  N  Using the Toilet? N  In the past six months, have you accidently leaked urine?  N  Do you have problems with loss of bowel control? N  Managing your Medications? N  Managing your Finances? N  Housekeeping or managing your Housekeeping? N    Patient Care Team: Arnette Felts, FNP as PCP - General (General Practice) Chilton Si, MD as PCP - Cardiology (Cardiology) Emi Holes, MD as Referring Physician (Podiatry) Harlan Stains, [facility] (Inactive) (Pharmacist)  Indicate any recent Medical Services you may have received from other than [facility] providers in the past year (date may be approximate).     Assessment:   This is a routine wellness examination for [place].  Hearing/Vision screen Hearing Screening - Comments:: Denies hearing issues Vision Screening - Comments:: Regular eye exams, Dr. Wynelle Link   Goals Addressed             This Visit's Progress    Patient Stated       05/29/2023, wants leg and hemorrhoids corrected and lose weight       Depression Screen    05/29/2023    8:47 AM 07/22/2022   10:08 AM 05/02/2022    8:24 AM 01/15/2022    3:42 PM 12/19/2021   10:30 AM 07/26/2021   10:03 AM 04/26/2021    8:59 AM  PHQ 2/9 Scores  PHQ - 2 Score 0 0 0 2 0 0 0  PHQ- 9 Score 1 0  5  0     Fall Risk    05/29/2023    8:46 AM 07/22/2022   10:08 AM 05/02/2022    8:24 AM 01/15/2022    3:44 PM 12/19/2021   10:30 AM  Fall Risk   Falls in the past year? 0 0 0 0 0  Number falls in past yr: 0 0 0 0 0  Injury with Fall? 0 0 0 0 0  Risk for fall due to : Medication side effect No Fall Risks Medication side effect No Fall Risks History of fall(s);No Fall Risks  Follow up Falls prevention discussed;Falls evaluation completed Falls evaluation completed Falls prevention discussed;Education provided;Falls evaluation completed Falls evaluation completed Falls evaluation completed    MEDICARE RISK AT HOME: Medicare Risk at Home Any stairs in or around the home?: Yes If so, are there any without handrails?: No Home free of loose throw rugs in walkways, pet beds,  electrical cords, etc?: Yes Adequate lighting in your home to reduce risk of falls?: Yes Life alert?: No Use of a cane, walker or w/c?: No Grab bars in the bathroom?: Yes Shower chair or bench in shower?: No Elevated toilet seat or a handicapped toilet?: No  TIMED UP AND GO:  Was the test performed?  Yes  Length of time to ambulate 10 feet: 5 sec Gait steady and fast without use of assistive device    Cognitive Function:        05/29/2023    8:48 AM 05/02/2022    8:26 AM 04/26/2021    9:02 AM 05/18/2020    9:45 AM  6CIT Screen  What Year? 0 points 0 points 0 points 0 points  What month? 0 points 0 points 0 points 0 points  What time? 0 points 0 points 0 points 0 points  Count back from 20 0 points 0 points 0 points 0 points  Months in reverse 0 points 0 points 4 points 0 points  Repeat phrase 6 points 0 points 0 points 4 points  Total Score 6 points 0 points 4 points 4 points    Immunizations Immunization History  Administered Date(s) Administered   Fluad Quad(high Dose 65+) 03/11/2019, 03/27/2022   Influenza, High Dose Seasonal PF 04/29/2018, 02/26/2021   Influenza-Unspecified 03/16/2020, 02/01/2023   PFIZER Comirnaty(Gray Top)Covid-19 Tri-Sucrose Vaccine 12/26/2020   PFIZER(Purple Top)SARS-COV-2 Vaccination 09/02/2019, 09/28/2019, 03/27/2020, 04/14/2021   Pfizer Covid-19 Vaccine Bivalent Booster 12yrs & up 04/14/2021   Pneumococcal Conjugate-13 03/11/2019   Pneumococcal Polysaccharide-23 06/09/2017   Tdap 02/10/2021   Unspecified SARS-COV-2 Vaccination 04/13/2022, 03/14/2023   Zoster Recombinant(Shingrix) 07/12/2021, 07/22/2022    TDAP status: Up to date  Flu Vaccine status: Up to date  Pneumococcal vaccine status: Up to date  Covid-19 vaccine status: Completed vaccines  Qualifies for Shingles Vaccine? Yes   Zostavax completed Yes   Shingrix Completed?: Yes  Screening Tests Health Maintenance  Topic Date Due   Medicare Annual Wellness (AWV)  05/28/2024    DTaP/Tdap/Td (2 - Td or Tdap) 02/11/2031   Colonoscopy  03/04/2032   Pneumonia Vaccine 50+ Years old  Completed   INFLUENZA VACCINE  Completed   DEXA SCAN  Completed   COVID-19 Vaccine  Completed   Hepatitis C Screening  Completed   Zoster Vaccines- Shingrix  Completed   HPV VACCINES  Aged Out    Health Maintenance  There are no preventive care reminders to display for this patient.   Colorectal cancer screening: Type of screening: Colonoscopy. Completed 03/04/2022. Repeat every 5 years  Mammogram status: Completed 03/24/2023. Repeat every year  Bone Density status: Completed 02/09/2020.   Lung Cancer Screening: (Low Dose CT Chest recommended if Age 82-80 years, 20 pack-year currently smoking OR have quit w/in 15years.) does not qualify.   Lung Cancer Screening Referral: no  Additional Screening:  Hepatitis C Screening: does qualify; Completed 05/27/2018  Vision Screening: Recommended annual ophthalmology exams for early detection of glaucoma and other disorders of the eye. Is the patient up to date with their annual eye exam?  Yes  Who is the provider or what is the name of the office in which the patient attends annual eye exams? Dr. Wynelle Link If pt is not established with a provider, would they like to be referred to a provider to establish care? No .   Dental Screening: Recommended annual dental exams for proper oral hygiene  Diabetic Foot Exam: n/a  Community Resource Referral / Chronic Care Management: CRR required this visit?  No   CCM required this visit?  No     Plan:     I have personally reviewed and noted the following in the patient's chart:   Medical and social history Use of alcohol, tobacco or illicit drugs  Current medications and supplements including opioid prescriptions. Patient is not currently taking opioid prescriptions. Functional ability and status Nutritional status Physical activity Advanced directives List of other  physicians Hospitalizations, surgeries, and ER visits in previous 12 months Vitals Screenings to include cognitive, depression, and falls Referrals and appointments  In addition, I have reviewed and discussed with patient certain preventive protocols, quality metrics, and best practice recommendations. A written personalized care plan for preventive services as well as general preventive health recommendations were provided to patient.     Barb Merino, LPN   16/03/9603   After Visit Summary: (In Person-Printed) AVS printed and given to the patient  Nurse Notes: none

## 2023-05-29 NOTE — Patient Instructions (Signed)
Ms. Hsueh , Thank you for taking time to come for your Medicare Wellness Visit. I appreciate your ongoing commitment to your health goals. Please review the following plan we discussed and let me know if I can assist you in the future.   Referrals/Orders/Follow-Ups/Clinician Recommendations: none  This is a list of the screening recommended for you and due dates:  Health Maintenance  Topic Date Due   Medicare Annual Wellness Visit  05/28/2024   DTaP/Tdap/Td vaccine (2 - Td or Tdap) 02/11/2031   Colon Cancer Screening  03/04/2032   Pneumonia Vaccine  Completed   Flu Shot  Completed   DEXA scan (bone density measurement)  Completed   COVID-19 Vaccine  Completed   Hepatitis C Screening  Completed   Zoster (Shingles) Vaccine  Completed   HPV Vaccine  Aged Out    Advanced directives: (Copy Requested) Please bring a copy of your health care power of attorney and living will to the office to be added to your chart at your convenience.  Next Medicare Annual Wellness Visit scheduled for next year: No, office will schedule  Insert Preventive Care attachment Insert FALL PREVENTION attachment if needed

## 2023-05-29 NOTE — Progress Notes (Signed)
Madelaine Bhat, CMA,acting as a Neurosurgeon for Arnette Felts, FNP.,have documented all relevant documentation on the behalf of Arnette Felts, FNP,as directed by  Arnette Felts, FNP while in the presence of Arnette Felts, FNP.  Subjective:  Patient ID: Terri Barajas , female    DOB: 09/11/1945 , 77 y.o.   MRN: [id number]  Chief Complaint  Patient presents with   Hypertension    HPI  Patient presents today for a bp and  predm follow up, Patient reports compliance with medication. Patient denies any chest pain, SOB, or headaches. Patient has no concerns today. She has hemorroids that are still present has appt with GI next month to have them tied off  She was to have PT in may but did not due to going to the Rheumatologist and had given an injection which did not help. She had to have another test done so she did not do PT again. And has appt next month.    Hypertension This is a chronic problem. The current episode started more than 1 year ago. The problem is uncontrolled. Pertinent negatives include no chest pain or palpitations. There is no history of chronic renal disease.  Hyperlipidemia This is a chronic problem. The current episode started more than 1 year ago. The problem is uncontrolled. She has no history of chronic renal disease. Pertinent negatives include no chest pain. Current antihyperlipidemic treatment includes statins and diet change. Risk factors for coronary artery disease include obesity and a sedentary lifestyle.  Knee Pain  The incident occurred more than 1 week ago. The pain is present in the left knee (never stops hurting - has appt with Orthopedic next month). The quality of the pain is described as aching and shooting. Pertinent negatives include no numbness or tingling. The symptoms are aggravated by movement. She has tried NSAIDs for the symptoms. The treatment provided no relief.    Past Medical History:  Diagnosis Date   Anxiety    Arthritis    Complication of  anesthesia    some type of sedation medication made her feel crazy   Dysrhythmia    Family history of adverse reaction to anesthesia    daughter has Malignant Hyperthermia hx -    GERD (gastroesophageal reflux disease)    Glaucoma    H/O: hysterectomy 1978   Heart disease    History of hiatal hernia    Hypertension    MVP (mitral valve prolapse)    Panic attacks    Pre-diabetes    Sciatica      Family History  Problem Relation Age of Onset   Pancreatic cancer Mother    Alcohol abuse Father    Asthma Sister    Hypertension Sister    Arthritis Sister    Arthritis Sister    Rheum arthritis Sister    Diabetes Brother    Arthritis Brother    Hyperlipidemia Brother    Hypertension Brother    Glaucoma Brother    Colon polyps Brother    Prostate cancer Brother    Congenital heart disease Brother    Throat cancer Brother    Alcohol abuse Brother    Healthy Daughter    Healthy Son    Colon cancer Neg Hx    Esophageal cancer Neg Hx    Inflammatory bowel disease Neg Hx    Liver disease Neg Hx    Rectal cancer Neg Hx    Stomach cancer Neg Hx      Current Outpatient Medications:  acetaminophen (TYLENOL) 500 MG tablet, Take 500-1,000 mg by mouth every 6 (six) hours as needed for moderate pain., Disp: , Rfl:    ALPRAZolam (XANAX) 0.25 MG tablet, Take 1 tablet (0.25 mg total) by mouth daily as needed for anxiety., Disp: 30 tablet, Rfl: 0   ascorbic acid (VITAMIN C) 500 MG tablet, Take 500 mg by mouth daily., Disp: , Rfl:    aspirin 81 MG chewable tablet, Chew 81 mg by mouth daily., Disp: , Rfl:    benzonatate (TESSALON) 100 MG capsule, TAKE 1 CAPSULE BY MOUTH THREE TIMES A DAY AS NEEDED FOR COUGH, Disp: 60 capsule, Rfl: 1   Biotin 5 MG CAPS, Take 5 mg by mouth daily., Disp: , Rfl:    calcium carbonate (OSCAL) 1500 (600 Ca) MG TABS tablet, Take 600 mg of elemental calcium by mouth daily., Disp: , Rfl:    cholecalciferol (VITAMIN D3) 25 MCG (1000 UNIT) tablet, Take 3,000 Units  by mouth daily., Disp: , Rfl:    dorzolamide-timolol (COSOPT) 22.3-6.8 MG/ML ophthalmic solution, Apply 1 drop to eye in the morning and at bedtime., Disp: , Rfl:    Evolocumab (REPATHA SURECLICK) 140 MG/ML SOAJ, Inject 140 mg into the skin every 14 (fourteen) days., Disp: 6 mL, Rfl: 1   fluticasone (FLONASE) 50 MCG/ACT nasal spray, Place 1 spray into both nostrils daily., Disp: 16 g, Rfl: 0   guaiFENesin (MUCINEX) 600 MG 12 hr tablet, Take 1 tablet (600 mg total) by mouth 2 (two) times daily as needed., Disp: 60 tablet, Rfl: 1   hydrochlorothiazide (HYDRODIURIL) 25 MG tablet, TAKE 1 TABLET BY MOUTH DAILY, Disp: 100 tablet, Rfl: 2   hydrocortisone (ANUSOL-HC) 25 MG suppository, Place 1 suppository (25 mg total) rectally 2 (two) times daily as needed for hemorrhoids or anal itching., Disp: 24 suppository, Rfl: 0   ibuprofen (ADVIL) 200 MG tablet, Take 200-400 mg by mouth every 6 (six) hours as needed for moderate pain., Disp: , Rfl:    latanoprost (XALATAN) 0.005 % ophthalmic solution, Place 1 drop into both eyes at bedtime. , Disp: , Rfl:    loratadine (CLARITIN REDITABS) 10 MG dissolvable tablet, Take 10 mg by mouth daily as needed for allergies., Disp: , Rfl:    Multiple Vitamins-Minerals (CENTRUM SILVER 50+WOMEN PO), Take 1 tablet by mouth daily., Disp: , Rfl:    omeprazole (PRILOSEC) 20 MG capsule, Take 20 mg by mouth 2 (two) times daily before a meal., Disp: , Rfl:    polyethylene glycol (MIRALAX / GLYCOLAX) 17 g packet, Take 17 g by mouth daily as needed for moderate constipation., Disp: 14 each, Rfl: 1   Probiotic Product (ALIGN PO), Take 1 capsule by mouth daily., Disp: , Rfl:    RHOPRESSA 0.02 % SOLN, Place 1 drop into both eyes every evening., Disp: , Rfl:    vitamin E 180 MG (400 UNITS) capsule, Take 400 Units by mouth daily., Disp: , Rfl:    Cyanocobalamin (VITAMIN B 12) 100 MCG LOZG, Take 1 tablet by mouth daily. (Patient not taking: Reported on 05/29/2023), Disp: , Rfl:    diltiazem  (CARDIZEM) 30 MG tablet, TAKE 1 TABLET BY MOUTH TWICE  DAILY, Disp: 60 tablet, Rfl: 2   Allergies  Allergen Reactions   Shellfish Allergy Anaphylaxis and Shortness Of Breath   Atorvastatin     Myalgias    Atracurium & Derivatives Other (See Comments)    Unknown   Atropine Other (See Comments)    Heart racing   Iodinated Contrast Media  Iodine Other (See Comments)    Unknown   Statins     "didn't feel right"      Review of Systems  Constitutional: Negative.   Respiratory: Negative.    Cardiovascular:  Negative for chest pain, palpitations and leg swelling.  Neurological:  Negative for tingling and numbness.  Psychiatric/Behavioral: Negative.       Today's Vitals   05/29/23 0906  BP: 118/64  Pulse: 73  Temp: 97.7 F (36.5 C)  TempSrc: Oral  Weight: 151 lb (68.5 kg)  Height: 5\' 3"  (1.6 m)  PainSc: 6    Body mass index is 26.75 kg/m.  Wt Readings from Last 3 Encounters:  05/29/23 151 lb (68.5 kg)  05/29/23 151 lb 9.6 oz (68.8 kg)  05/02/23 152 lb (68.9 kg)     Objective:  Physical Exam Vitals reviewed.  Constitutional:      General: She is not in acute distress.    Appearance: Normal appearance.  Cardiovascular:     Rate and Rhythm: Normal rate and regular rhythm.     Pulses: Normal pulses.     Heart sounds: Normal heart sounds. No murmur heard. Pulmonary:     Effort: Pulmonary effort is normal. No respiratory distress.     Breath sounds: Normal breath sounds. No wheezing.  Neurological:     Mental Status: She is alert.         Assessment And Plan:  Essential hypertension Assessment & Plan: Blood pressure is well controlled, continue current medications.   Orders: -     BMP8+eGFR  Prediabetes Assessment & Plan: HgbA1c is stable. Continue focusing on healthy diet.   Orders: -     BMP8+eGFR -     Hemoglobin A1c  Mixed hyperlipidemia Assessment & Plan: Cholesterol levels are stable. She is unable to tolerate statins.   Orders: -      BMP8+eGFR -     Lipid panel  Aortic atherosclerosis (HCC) Assessment & Plan: Continue statin, tolerating well   Orders: -     BMP8+eGFR  OSA on CPAP Assessment & Plan: Continue CPAP doing well.    Interstitial pulmonary disease (HCC)  Statin myopathy  Chronic pain of left knee Assessment & Plan: Tenderness to left knee, will treat with Toradaol  Orders: -     Ketorolac Tromethamine    Return for 6 month bp check.  Patient was given opportunity to ask questions. Patient verbalized understanding of the plan and was able to repeat key elements of the plan. All questions were answered to their satisfaction.    Jeanell Sparrow, FNP, have reviewed all documentation for this visit. The documentation on 05/29/23 for the exam, diagnosis, procedures, and orders are all accurate and complete.   IF YOU HAVE BEEN REFERRED TO A SPECIALIST, IT MAY TAKE 1-2 WEEKS TO SCHEDULE/PROCESS THE REFERRAL. IF YOU HAVE NOT HEARD FROM US/SPECIALIST IN TWO WEEKS, PLEASE GIVE [place] A CALL AT [phone].

## 2023-05-30 ENCOUNTER — Encounter: Payer: Self-pay | Admitting: Nurse Practitioner

## 2023-05-30 LAB — BMP8+EGFR
BUN/Creatinine Ratio: 25 (ref 12–28)
BUN: 20 mg/dL (ref 8–27)
CO2: 27 mmol/L (ref 20–29)
Calcium: 10 mg/dL (ref 8.7–10.3)
Chloride: 98 mmol/L (ref 96–106)
Creatinine, Ser: 0.81 mg/dL (ref 0.57–1.00)
Glucose: 111 mg/dL — ABNORMAL HIGH (ref 70–99)
Potassium: 3.8 mmol/L (ref 3.5–5.2)
Sodium: 141 mmol/L (ref 134–144)
eGFR: 76 mL/min/{1.73_m2} (ref >59)

## 2023-05-30 LAB — LIPID PANEL
Chol/HDL Ratio: 2.9 {ratio} (ref 0.0–4.4)
Cholesterol, Total: 177 mg/dL (ref 100–199)
HDL: 61 mg/dL (ref >39)
LDL Chol Calc (NIH): 91 mg/dL (ref 0–99)
Triglycerides: 143 mg/dL (ref 0–149)
VLDL Cholesterol Cal: 25 mg/dL (ref 5–40)

## 2023-05-30 LAB — HEMOGLOBIN A1C
Est. average glucose Bld gHb Est-mCnc: 137 mg/dL
Hgb A1c MFr Bld: 6.4 % — ABNORMAL HIGH (ref 4.8–5.6)

## 2023-06-10 ENCOUNTER — Other Ambulatory Visit: Payer: Self-pay | Admitting: Cardiovascular Disease

## 2023-06-15 DIAGNOSIS — G8929 Other chronic pain: Secondary | ICD-10-CM | POA: Insufficient documentation

## 2023-06-15 NOTE — Assessment & Plan Note (Signed)
Blood pressure is well controlled, continue current medications.

## 2023-06-15 NOTE — Assessment & Plan Note (Signed)
Tenderness to left knee, will treat with Toradaol

## 2023-06-15 NOTE — Assessment & Plan Note (Signed)
Continue statin, tolerating well 

## 2023-06-15 NOTE — Assessment & Plan Note (Signed)
Continue CPAP doing well.

## 2023-06-15 NOTE — Assessment & Plan Note (Signed)
HgbA1c is stable. Continue focusing on healthy diet.

## 2023-06-15 NOTE — Assessment & Plan Note (Addendum)
Cholesterol levels are stable. She is unable to tolerate statins.

## 2023-06-25 DIAGNOSIS — M170 Bilateral primary osteoarthritis of knee: Secondary | ICD-10-CM | POA: Diagnosis not present

## 2023-07-02 ENCOUNTER — Ambulatory Visit: Payer: Medicare Other | Admitting: Primary Care

## 2023-07-02 ENCOUNTER — Encounter: Payer: Self-pay | Admitting: Primary Care

## 2023-07-02 VITALS — BP 111/58 | HR 81 | Temp 98.7°F | Ht 63.0 in | Wt 155.4 lb

## 2023-07-02 DIAGNOSIS — G4733 Obstructive sleep apnea (adult) (pediatric): Secondary | ICD-10-CM

## 2023-07-02 MED ORDER — BENZONATATE 100 MG PO CAPS: 100.0000 mg | ORAL_CAPSULE | Freq: Two times a day (BID) | ORAL | 1 refills | Status: DC | PRN

## 2023-07-02 NOTE — Progress Notes (Signed)
 @Patient  ID: Terri Barajas, female    DOB: [birth year], 78 y.o.   MRN: [id number]  Chief Complaint  Patient presents with   Follow-up    Referring provider: Susanna Epley, FNP  HPI: 78 year old female.  Medical history significant for interstitial pulmonary disease, hiatal hernia, chronic hypoxic respiratory failure, LPR, OSA on CPAP, hypertension, aortic arthrosclerosis, mitral valve prolapse, hyperlipidemia, prediabetes.   Previous [facility] pulmonary encounter: 10/15/2022 Patient presents today for follow-up chronic cough She is doing well today. She has chronic nonproductive cough. Dx with covid-19 in January 2021, she did not require hospitalization and was treated with monoclonal antibody  PFTs today showed mild restrictive lung disease with normal diffusion capacity  CT chest with mild reticular changes at bases consistent with post-COVID ILD, this has remained stable  Using CPAP nightly, managed by Dr. Albertina Hugger with neurology. Having issues with humidity.  Continues to take Dexilant , antihistamine, mucinex  and flonase  as prescribed  Needs refill tessalon  perles for cough   07/02/2023 Discussed the use of AI scribe software for clinical note transcription with the patient, who gave verbal consent to proceed.  History of Present Illness   The patient, with a history of COVID-19 infection in January 2021, presents for a follow-up visit. She was treated with monoclonal antibodies and did not require hospitalization. Pulmonary function tests (PFTs) conducted in the spring revealed mild restrictive lung disease, with lung function at 77% of the predicted value. A CT scan from the same period showed mild scarring in the lungs, likely contributing to the restrictive lung disease.  The patient reports a change in her breathing, describing it as heavier, particularly during activities such as walking. She also notes occasional difficulty taking deep breaths. Alongside these changes, the  patient has a persistent cough, sometimes productive of a small amount of mucus. Despite using Flonase , Mucinex , and benzonatate , the cough persists.  The patient also has a history of sleep apnea, managed with CPAP therapy. She reports consistent use of the CPAP machine, but experiences dry mouth, particularly at night. She has tried various remedies, including Biotene products, but the dry mouth persists.  The patient also reports chronic knee pain due to a previous torn meniscus and subsequent surgery, gel shots, and cortisone shots. This pain limits her physical activity, though she tries to maintain cardiovascular health through non-weight-bearing exercises such as cycling.  The patient is also on Dexilant  for reflux. Despite these various treatments, the patient continues to experience symptoms, particularly the persistent cough and changes in breathing.        Airview download 04/03/23-07/01/23 Usage 89/90 days (99%); 75 days (83%) Average usage 5 hours 17 mins Pressure 5-13cm h20  Airleaks 9.1L/min (95%) AHI 0.6   PFTs: 04/16/2021 >> FVC 1.95 [90%], FEV1 1.65 [99%], F/F85, TLC 4.05 [82%], DLCO 15.51 [83%] Normal test  10/02/22 >> FVC 1.92 (70%), FEV1 1.58 (77%), ratio 82, DLCO 17. 69 (95%)  Mild restriction    Allergies  Allergen Reactions   Shellfish Allergy Anaphylaxis and Shortness Of Breath   Atorvastatin      Myalgias    Atracurium & Derivatives Other (See Comments)    Unknown   Atropine Other (See Comments)    Heart racing   Iodinated Contrast Media    Iodine Other (See Comments)    Unknown   Statins     "didn't feel right"     Immunization History  Administered Date(s) Administered   Fluad Quad(high Dose 65+) 03/11/2019, 03/27/2022   Influenza, High Dose Seasonal PF  04/29/2018, 02/26/2021   Influenza-Unspecified 03/16/2020, 02/01/2023   PFIZER Comirnaty(Gray Top)Covid-19 Tri-Sucrose Vaccine 12/26/2020   PFIZER(Purple Top)SARS-COV-2 Vaccination 09/02/2019,  09/28/2019, 03/27/2020, 04/14/2021   Pfizer Covid-19 Vaccine Bivalent Booster 65yrs & up 04/14/2021   Pneumococcal Conjugate-13 03/11/2019   Pneumococcal Polysaccharide-23 06/09/2017   Tdap 02/10/2021   Unspecified SARS-COV-2 Vaccination 04/13/2022, 03/14/2023   Zoster Recombinant(Shingrix ) 07/12/2021, 07/22/2022    Past Medical History:  Diagnosis Date   Anxiety    Arthritis    Complication of anesthesia    some type of sedation medication made her feel crazy   Dysrhythmia    Family history of adverse reaction to anesthesia    daughter has Malignant Hyperthermia hx -    GERD (gastroesophageal reflux disease)    Glaucoma    H/O: hysterectomy 1978   Heart disease    History of hiatal hernia    Hypertension    MVP (mitral valve prolapse)    Panic attacks    Pre-diabetes    Sciatica     Tobacco History: Social History   Tobacco Use  Smoking Status Never   Passive exposure: Never  Smokeless Tobacco Never   Counseling given: Not Answered  Review of Systems  Review of Systems  Constitutional: Negative.   Respiratory:  Positive for cough and shortness of breath.    Physical Exam  BP (!) 111/58 (BP Location: Left Arm, Patient Position: Sitting, Cuff Size: Large)   Pulse 81   Temp 98.7 F (37.1 C) (Oral)   Ht 5\' 3"  (1.6 m)   Wt 155 lb 6.4 oz (70.5 kg)   LMP  (LMP Unknown)   SpO2 98%   BMI 27.53 kg/m  Physical Exam Constitutional:      Appearance: Normal appearance.  HENT:     Head: Normocephalic and atraumatic.  Cardiovascular:     Rate and Rhythm: Normal rate and regular rhythm.  Pulmonary:     Effort: Pulmonary effort is normal.     Breath sounds: Normal breath sounds. No wheezing, rhonchi or rales.  Musculoskeletal:        General: Normal range of motion.  Skin:    General: Skin is warm and dry.  Neurological:     General: No focal deficit present.     Mental Status: She is alert and oriented to person, place, and time. Mental status is at baseline.       Lab Results:  CBC    Component Value Date/Time   WBC 7.4 11/13/2022 1151   RBC 4.32 11/13/2022 1151   HGB 12.8 11/13/2022 1151   HGB 12.9 07/22/2022 1056   HCT 39.7 11/13/2022 1151   HCT 39.9 07/22/2022 1056   PLT 262 11/13/2022 1151   PLT 251 07/22/2022 1056   MCV 91.9 11/13/2022 1151   MCV 92 07/22/2022 1056   MCH 29.6 11/13/2022 1151   MCHC 32.2 11/13/2022 1151   RDW 11.8 11/13/2022 1151   RDW 12.3 07/22/2022 1056   LYMPHSABS 1,739 11/13/2022 1151   MONOABS 0.5 02/15/2020 1045   EOSABS 252 11/13/2022 1151   BASOSABS 37 11/13/2022 1151    BMET    Component Value Date/Time   NA 141 05/29/2023 1138   K 3.8 05/29/2023 1138   CL 98 05/29/2023 1138   CO2 27 05/29/2023 1138   GLUCOSE 111 (H) 05/29/2023 1138   GLUCOSE 71 11/13/2022 1151   BUN 20 05/29/2023 1138   CREATININE 0.81 05/29/2023 1138   CREATININE 0.80 11/13/2022 1151   CALCIUM  10.0 05/29/2023 1138  GFRNONAA >60 08/16/2022 1907   GFRAA 83 03/01/2020 1106    BNP No results found for: "BNP"  ProBNP No results found for: "PROBNP"  Imaging: No results found.   Assessment & Plan:   1. 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 Patient reports increased difficulty breathing and persistent cough. -Due for repeat CT scan in March 2025 to monitor for progression of pulmonary fibrosis. -Consider trial of albuterol  inhaler for symptomatic relief, pending patient's review of potential side effects.  Chronic Cough Persistent despite use of benzonatate , Mucinex , and Flonase . -Refill benzonatate  prescription. -Continue current cough management regimen.  Sleep Apnea Well-managed with CPAP, but patient reports persistent fatigue and dry mouth. -Continue CPAP use nightly 4-6 hours or longer  -DME order sent for replacement CPAP machine at current pressure  settings -Recommend follow-up with Dr. Albertina Hugger to address persistent fatigue. -Suggest potential interventions for dry mouth, including Biotene mouthwash and adjustments to CPAP humidification or trying chin strap. -FU 31-90 days for CPAP compliance check   Orthopedic Limitations Knee pain limiting physical activity. -Encourage non-weight bearing exercise such as cycling for cardiovascular health.      Antonio Baumgarten, NP 07/02/2023

## 2023-07-02 NOTE — Patient Instructions (Addendum)
 -POST-COVID-19 PULMONARY FIBROSIS: This condition involves scarring in the lungs following a COVID-19 infection, leading to reduced lung function. You have been oredered for repeat CT chest scan in March 2025 to monitor for any progression and consider a trial of an albuterol  inhaler for relief, depending on your comfort with its potential side effects.  -CHRONIC COUGH: Your persistent cough has not improved with current medications. We will refill your benzonatate  prescription and continue with your current cough management regimen.  -SLEEP APNEA: This is a condition where breathing repeatedly stops and starts during sleep. Your sleep apnea is well-managed with CPAP, but you are experiencing dry mouth and fatigue. Continue using your CPAP nightly, follow up with Dr. Domeyer for the fatigue, and consider using Biotene mouthwash and adjusting your CPAP humidification to help with the dry mouth.  -ORTHOPEDIC LIMITATIONS: Your knee pain from a previous torn meniscus and surgery limits your physical activity. Continue with non-weight bearing exercises like cycling to maintain cardiovascular health.  ORDERS: Replacement CPAP machine (ordered)   INSTRUCTIONS: Please schedule a repeat CT scan for March 2025 to monitor your pulmonary fibrosis. Follow up with Dr. Albertina Hugger to address your persistent fatigue related to sleep apnea.  Follow-up 6 months with Dr. Waylan Haggard   Albuterol  Metered Dose Inhaler (MDI) What is this medication? ALBUTEROL  (al Peggi Bowels) treats lung diseases, such as asthma, where the airways in the lungs narrow, causing breathing problems or wheezing (bronchospasm). It is also used to treat asthma or prevent breathing problems during exercise. It works by opening the airways of the lungs, making it easier to breathe. It is often called a rescue or quick-relief medication. This medicine may be used for other purposes; ask your health care provider or pharmacist if you have  questions. COMMON BRAND NAME(S): Proair  HFA, Proventil , Proventil  HFA, Respirol, Ventolin , Ventolin  HFA What should I tell my care team before I take this medication? They need to know if you have any of these conditions: Diabetes Heart disease High blood pressure Irregular heartbeat or rhythm Pheochromocytoma Seizures Thyroid  disease An unusual or allergic reaction to albuterol , other medications, foods, dyes, or preservatives Pregnant or trying to get pregnant Breastfeeding How should I use this medication? This medication is inhaled through the mouth. Take it as directed on the prescription label. Do not use it more often than directed. This medication comes with INSTRUCTIONS FOR USE. Ask your pharmacist for directions on how to use this medication. Read the information carefully. Talk to your pharmacist or care team if you have questions. Talk to your care team about the use of this medication in children. While it may be given to children for selected conditions, precautions do apply. Overdosage: If you think you have taken too much of this medicine contact a poison control center or emergency room at once. NOTE: This medicine is only for you. Do not share this medicine with others. What if I miss a dose? If you take this medication on a regular basis, take it as soon as you can. If it is almost time for your next dose, take only that dose. Do not take double or extra doses. What may interact with this medication? Certain medications for blood pressure, heart disease, irregular heartbeat Certain medications for depression, anxiety, or other mental health conditions Diuretics MAOIs, such as Marplan, Nardil, and Parnate This list may not describe all possible interactions. Give your health care provider a list of all the medicines, herbs, non-prescription drugs, or dietary supplements you use. Also  tell them if you smoke, drink alcohol, or use illegal drugs. Some items may interact with  your medicine. What should I watch for while using this medication? Visit your care team for regular checks on your progress. Tell your care team if your symptoms do not start to get better or if they get worse. If your symptoms get worse or if you are using this medication more than normal, call your care team right away. You and your care team should develop an Asthma Action Plan that is just for you. Be sure to know what to do if you are in the yellow (asthma is getting worse) or red (medical alert) zones. Your mouth may get dry. Chewing sugarless gum or sucking hard candy and drinking plenty of water may help. Contact your care team if the problem does not go away or is severe. What side effects may I notice from receiving this medication? Side effects that you should report to your care team as soon as possible: Allergic reactions--skin rash, itching, hives, swelling of the face, lips, tongue, or throat Heart rhythm changes--fast or irregular heartbeat, dizziness, feeling faint or lightheaded, chest pain, trouble breathing Increase in blood pressure Muscle pain or cramps Wheezing or trouble breathing that is worse after use Side effects that usually do not require medical attention (report to your care team if they continue or are bothersome): Change in taste Dry mouth Headache Sore throat Tremors or shaking Trouble sleeping This list may not describe all possible side effects. Call your doctor for medical advice about side effects. You may report side effects to FDA at 1-800-FDA-1088. Where should I keep my medication? Keep out of the reach of children and pets. Store at room temperature between 20 and 25 degrees C (68 and 77 degrees F). Keep inhaler away from extreme heat and cold. Get rid of it when the dose counter reads 0 or after the expiration date, whichever is first. To get rid of medications that are no longer needed or have expired: Take the medication to a medication  take-back program. Check with your pharmacy or law enforcement to find a location. If you cannot return the medication, ask your care team how to get rid of this medication safely. NOTE: This sheet is a summary. It may not cover all possible information. If you have questions about this medicine, talk to your doctor, pharmacist, or health care provider.  2024 Elsevier/Gold Standard (2021-12-24 00:00:00)

## 2023-07-07 DIAGNOSIS — M1712 Unilateral primary osteoarthritis, left knee: Secondary | ICD-10-CM | POA: Diagnosis not present

## 2023-07-08 DIAGNOSIS — M25562 Pain in left knee: Secondary | ICD-10-CM | POA: Diagnosis not present

## 2023-07-17 ENCOUNTER — Encounter: Payer: Self-pay | Admitting: Gastroenterology

## 2023-07-17 ENCOUNTER — Ambulatory Visit: Payer: Medicare Other | Admitting: Gastroenterology

## 2023-07-17 VITALS — BP 140/68 | HR 86 | Ht 63.0 in | Wt 155.0 lb

## 2023-07-17 DIAGNOSIS — K5909 Other constipation: Secondary | ICD-10-CM

## 2023-07-17 DIAGNOSIS — K641 Second degree hemorrhoids: Secondary | ICD-10-CM | POA: Diagnosis not present

## 2023-07-17 NOTE — Patient Instructions (Signed)
_______________________________________________________  If your blood pressure at your visit was 140/90 or greater, please contact your primary care physician to follow up on this.  _______________________________________________________  If you are age 76 or older, your body mass index should be between 23-30. Your Body mass index is 27.46 kg/m. If this is out of the aforementioned range listed, please consider follow up with your Primary Care Provider.  If you are age 46 or younger, your body mass index should be between 19-25. Your Body mass index is 27.46 kg/m. If this is out of the aformentioned range listed, please consider follow up with your Primary Care Provider.   ________________________________________________________  The [place] [facility] providers would like to encourage you to use [facility] to communicate with providers for non-urgent requests or questions.  Due to long hold times on the telephone, sending your provider a message by [facility] may be a faster and more efficient way to get a response.  Please allow 48 business hours for a response.  Please remember that this is for non-urgent requests.  _______________________________________________________  [facility] PROCEDURE    FOLLOW-UP CARE   The procedure you have had should have been relatively painless since the banding of the area involved does not have nerve endings and there is no pain sensation.  The rubber band cuts off the blood supply to the hemorrhoid and the band may fall off as soon as 48 hours after the banding (the band may occasionally be seen in the toilet bowl following a bowel movement). You may notice a temporary feeling of fullness in the rectum which should respond adequately to plain Tylenol or Motrin.  Following the banding, avoid strenuous exercise that evening and resume full activity the next day.  A sitz bath (soaking in a warm tub) or bidet is soothing, and can be useful for cleansing the area  after bowel movements.     To avoid constipation, take two tablespoons of natural wheat bran, natural oat bran, flax, Benefiber or any over the counter fiber supplement and increase your water intake to 7-8 glasses daily.    Unless you have been prescribed anorectal medication, do not put anything inside your rectum for two weeks: No suppositories, enemas, fingers, etc.  Occasionally, you may have more bleeding than usual after the banding procedure.  This is often from the untreated hemorrhoids rather than the treated one.  Don't be concerned if there is a tablespoon or so of blood.  If there is more blood than this, lie flat with your bottom higher than your head and apply an ice pack to the area. If the bleeding does not stop within a half an hour or if you feel faint, call our office at [phone] or go to the emergency room.  Problems are not common; however, if there is a substantial amount of bleeding, severe pain, chills, fever or difficulty passing urine (very rare) or other problems, you should call us at [phone] or report to the nearest emergency room.  Do not stay seated continuously for more than 2-3 hours for a day or two after the procedure.  Tighten your buttock muscles 10-15 times every two hours and take 10-15 deep breaths every 1-2 hours.  Do not spend more than a few minutes on the toilet if you cannot empty your bowel; instead re-visit the toilet at a later time.    It was a pleasure to see you today!  Vito Cirigliano, D.O.

## 2023-07-17 NOTE — Progress Notes (Signed)
Chief Complaint:    Symptomatic Internal Hemorrhoids; Hemorrhoid Band Ligation   HPI:     Terri Barajas is a 78 y.o. female with a pmh significant for HTN, Glaucoma, MVP, GERD, previous diagnosis Barrett's esophagus (negative on recent 2020 and 2023 EGD), SIBO (previously diagnosed via breath test), constipation, hemorrhoids, referred to me by Dr. Meridee Score to evaluate for evaluation and treatment of symptomatic hemorrhoids (pruritus ani, irritation) with consideration for hemorrhoid banding. The patient presents with symptomatic grade 2  hemorrhoids, unresponsive to maximal medical therapy, requesting rubber band ligation of symptomatic hemorrhoidal disease.  She recalls having hemorrhoid banding done at some point in the past prior to moving to [place].  Most recent colonoscopy in 2023 consistent with internal/external hemorrhoids, and she would like to proceed with banding today.  Constipation generally controlled after increasing MiraLAX to twice daily at her last appointment with Dr. Meridee Score on 05/02/2023.    No change in medical or surgical history, medications, allergies, social history since last appointment with me.   Review of systems:     No chest pain, no SOB, no fevers, no urinary sx   Past Medical History:  Diagnosis Date   Anxiety    Arthritis    Complication of anesthesia    some type of sedation medication made her feel crazy   Dysrhythmia    Family history of adverse reaction to anesthesia    daughter has Malignant Hyperthermia hx -    GERD (gastroesophageal reflux disease)    Glaucoma    H/O: hysterectomy 1978   Heart disease    History of hiatal hernia    Hypertension    MVP (mitral valve prolapse)    Panic attacks    Pre-diabetes    Sciatica     Patient's surgical history, family medical history, social history, medications and allergies were all reviewed in Epic    Current Outpatient Medications  Medication Sig Dispense Refill    acetaminophen (TYLENOL) 500 MG tablet Take 500-1,000 mg by mouth every 6 (six) hours as needed for moderate pain.     ALPRAZolam (XANAX) 0.25 MG tablet Take 1 tablet (0.25 mg total) by mouth daily as needed for anxiety. 30 tablet 0   ascorbic acid (VITAMIN C) 500 MG tablet Take 500 mg by mouth daily.     aspirin 81 MG chewable tablet Chew 81 mg by mouth daily.     benzonatate (TESSALON) 100 MG capsule Take 1 capsule (100 mg total) by mouth 2 (two) times daily as needed for cough. 60 capsule 1   Biotin 5 MG CAPS Take 5 mg by mouth daily.     calcium carbonate (OSCAL) 1500 (600 Ca) MG TABS tablet Take 600 mg of elemental calcium by mouth daily.     cholecalciferol (VITAMIN D3) 25 MCG (1000 UNIT) tablet Take 3,000 Units by mouth daily.     Cyanocobalamin (VITAMIN B 12) 100 MCG LOZG Take 1 tablet by mouth daily.     diltiazem (CARDIZEM) 30 MG tablet TAKE 1 TABLET BY MOUTH TWICE  DAILY 60 tablet 2   dorzolamide-timolol (COSOPT) 22.3-6.8 MG/ML ophthalmic solution Apply 1 drop to eye in the morning and at bedtime.     Evolocumab (REPATHA SURECLICK) 140 MG/ML SOAJ Inject 140 mg into the skin every 14 (fourteen) days. 6 mL 1   fluticasone (FLONASE) 50 MCG/ACT nasal spray Place 1 spray into both nostrils daily. 16 g 0   guaiFENesin (MUCINEX) 600 MG 12 hr tablet Take 1 tablet (600 mg  total) by mouth 2 (two) times daily as needed. 60 tablet 1   hydrochlorothiazide (HYDRODIURIL) 25 MG tablet TAKE 1 TABLET BY MOUTH DAILY 100 tablet 2   hydrocortisone (ANUSOL-HC) 25 MG suppository Place 1 suppository (25 mg total) rectally 2 (two) times daily as needed for hemorrhoids or anal itching. 24 suppository 0   ibuprofen (ADVIL) 200 MG tablet Take 200-400 mg by mouth every 6 (six) hours as needed for moderate pain.     latanoprost (XALATAN) 0.005 % ophthalmic solution Place 1 drop into both eyes at bedtime.      loratadine (CLARITIN REDITABS) 10 MG dissolvable tablet Take 10 mg by mouth daily as needed for allergies.      Multiple Vitamins-Minerals (CENTRUM SILVER 50+WOMEN PO) Take 1 tablet by mouth daily.     omeprazole (PRILOSEC) 20 MG capsule Take 20 mg by mouth 2 (two) times daily before a meal.     polyethylene glycol (MIRALAX / GLYCOLAX) 17 g packet Take 17 g by mouth daily as needed for moderate constipation. 14 each 1   Probiotic Product (ALIGN PO) Take 1 capsule by mouth daily.     RHOPRESSA 0.02 % SOLN Place 1 drop into both eyes every evening.     vitamin E 180 MG (400 UNITS) capsule Take 400 Units by mouth daily.     No current facility-administered medications for this visit.    Physical Exam:     BP (!) 140/68   Pulse 86   Ht 5\' 3"  (1.6 m)   Wt 155 lb (70.3 kg)   LMP  (LMP Unknown)   BMI 27.46 kg/m   GENERAL:  Pleasant female in NAD PSYCH: : Cooperative, normal affect NEURO: Alert and oriented x 3 Rectal exam: Sensation intact and preserved anal wink.  Grade 2 hemorrhoids noted in all positions on exam.  No external anal fissures noted. Normal sphincter tone. No palpable mass. No blood on the exam glove. (Chaperone: Loysie, CMA).   IMPRESSION and PLAN:    #1.  Symptomatic internal hemorrhoids: PROCEDURE NOTE: The patient presents with symptomatic grade 2  hemorrhoids, unresponsive to maximal medical therapy, requesting rubber band ligation of symptomatic hemorrhoidal disease.  All risks, benefits and alternative forms of therapy were described and informed consent was obtained.  In the Left Lateral Decubitus position, anoscopic examination revealed grade 2 hemorrhoids in the all position(s).  The anorectum was pre-medicated with RectiCare. The decision was made to band the LL internal hemorrhoid, and the [facility] O'Regan System was used to perform band ligation without complication.  Digital anorectal examination was then performed to assure proper positioning of the band, and to adjust the banded tissue as required.  The patient was discharged home without pain or other issues.   Dietary and behavioral recommendations were given and along with follow-up instructions.    The patient will return in 4+ weeks for follow-up and possible additional banding as required. No complications were encountered and the patient tolerated the procedure well.      #2.  Chronic constipation - Continue MiraLAX as prescribed with goal of soft stools without straining to have BM - Continue adequate hydration - Continue fiber supplement - Continue follow-up with Dr. Rachel Moulds ,DO, [facility] 07/17/2023, 11:29 AM

## 2023-07-22 ENCOUNTER — Encounter: Payer: Self-pay | Admitting: Gastroenterology

## 2023-07-23 ENCOUNTER — Telehealth: Payer: Self-pay | Admitting: Gastroenterology

## 2023-07-23 NOTE — Telephone Encounter (Signed)
 Patient called and stated that she had a hemorrhoid banding done recently and is wanting to know want she can't put on her rectum for her pain. Patient is requesting a call back today if possible. Please advise.

## 2023-07-23 NOTE — Telephone Encounter (Signed)
 See phone call dated 07/23/23 for additional information.

## 2023-07-23 NOTE — Telephone Encounter (Signed)
Dottie pt had banding with Dr Barron Alvine. See her complaints below.

## 2023-07-23 NOTE — Telephone Encounter (Signed)
 This  pt had a banding with Dr Karene Oto.  Will send to his nurse

## 2023-07-23 NOTE — Telephone Encounter (Signed)
 Contacted patient by phone. She complains of rectal discomfort/dryness following bowel movements both inside the rectum and directly outside the rectum. She states that she also has rectal discomfort when sitting for long periods. Says this pain is new/different from her previous rectal pain complaints. She denies any bleeding following hemorrhoidal banding on 07/17/23. Denies any constipation although she complains of continued abdominal fullness and bloating. Patient indicates that she is taking benefiber 1 packet daily, miralax  1 capful daily and sitz baths several times daily. She is drinking 4-5 8oz bottles of water daily. We discussed increasing water intake to at least 64 oz daily; high fiber foods.  Discussed that she may take tyenol as directed for rectal discomfort as needed. Is she able to use any rectal creams/suppositories having just had hemorrhoidal banding?  Next banding is scheduled for 08/21/23.

## 2023-07-24 NOTE — Telephone Encounter (Signed)
 Patient is advised she may purchase calmol 4 or recticare OTC to use for rectal irritation following hemorrhoidal banding. She is also advised that she may use Tylenol  as needed. She verbalizes understanding.

## 2023-07-25 DIAGNOSIS — M25562 Pain in left knee: Secondary | ICD-10-CM | POA: Diagnosis not present

## 2023-08-01 ENCOUNTER — Other Ambulatory Visit: Payer: Self-pay | Admitting: Cardiovascular Disease

## 2023-08-01 DIAGNOSIS — E782 Mixed hyperlipidemia: Secondary | ICD-10-CM

## 2023-08-01 DIAGNOSIS — I700 Atherosclerosis of aorta: Secondary | ICD-10-CM

## 2023-08-08 DIAGNOSIS — M1711 Unilateral primary osteoarthritis, right knee: Secondary | ICD-10-CM | POA: Insufficient documentation

## 2023-08-18 DIAGNOSIS — H04123 Dry eye syndrome of bilateral lacrimal glands: Secondary | ICD-10-CM | POA: Diagnosis not present

## 2023-08-18 DIAGNOSIS — I10 Essential (primary) hypertension: Secondary | ICD-10-CM | POA: Diagnosis not present

## 2023-08-18 DIAGNOSIS — H401132 Primary open-angle glaucoma, bilateral, moderate stage: Secondary | ICD-10-CM | POA: Diagnosis not present

## 2023-08-19 ENCOUNTER — Telehealth: Payer: Self-pay | Admitting: *Deleted

## 2023-08-19 DIAGNOSIS — M25561 Pain in right knee: Secondary | ICD-10-CM | POA: Diagnosis not present

## 2023-08-19 DIAGNOSIS — M25661 Stiffness of right knee, not elsewhere classified: Secondary | ICD-10-CM | POA: Diagnosis not present

## 2023-08-19 NOTE — Telephone Encounter (Signed)
   Pre-operative Risk Assessment    Patient Name: Terri Barajas  DOB: [birth year] MRN: [id number]   Date of last office visit: 08/06/22 Wallis Bamberg, NP Date of next office visit: 10/10/23 DR. Homeworth-1 YR F/U   Request for Surgical Clearance    Procedure:   LEFT KNEE SCOPE  Date of Surgery:  Clearance TBD                                Surgeon:  DR. Teryl Lucy Surgeon's Group or Practice Name:  [facility] Phone number:  [phone] [address] Fax number:  [phone]   Type of Clearance Requested:   - Medical  - Pharmacy:  Hold Aspirin     Type of Anesthesia:  General    Additional requests/questions:    Elpidio Anis   08/19/2023, 9:49 AM

## 2023-08-19 NOTE — Telephone Encounter (Signed)
   Name: Terri Barajas  DOB: [birth year]  MRN: [id number]  Primary Cardiologist: Chilton Si, MD  Chart reviewed as part of pre-operative protocol coverage. The patient has an upcoming visit scheduled with Dr. Duke Salvia on 10/10/2023 at which time clearance can be addressed in case there are any issues that would impact surgical recommendations.   LEFT KNEE SCOPE is not scheduled until TBD as below. I added preop FYI to appointment note so that provider is aware to address at time of outpatient visit.  Per office protocol the cardiology provider should forward their finalized clearance decision and recommendations regarding antiplatelet therapy to the requesting party below.    I will route this message as FYI to requesting party and remove this message from the preop box as separate preop APP input not needed at this time.   Please call with any questions.  Joylene Grapes, NP  08/19/2023, 10:12 AM

## 2023-08-20 ENCOUNTER — Telehealth: Payer: Self-pay | Admitting: Primary Care

## 2023-08-20 ENCOUNTER — Ambulatory Visit
Admission: RE | Admit: 2023-08-20 | Discharge: 2023-08-20 | Disposition: A | Discharge status: Home / Self Care | Payer: Medicare Other | Source: Ambulatory Visit | Attending: Pulmonary Disease

## 2023-08-20 ENCOUNTER — Telehealth: Payer: Self-pay

## 2023-08-20 DIAGNOSIS — J8410 Pulmonary fibrosis, unspecified: Secondary | ICD-10-CM | POA: Diagnosis not present

## 2023-08-20 DIAGNOSIS — R0602 Shortness of breath: Secondary | ICD-10-CM | POA: Diagnosis not present

## 2023-08-20 DIAGNOSIS — J849 Interstitial pulmonary disease, unspecified: Secondary | ICD-10-CM

## 2023-08-20 NOTE — Telephone Encounter (Signed)
 Fax received from Dr. Teryl Lucy with [facility] to perform a left knee scope under general anesthesia on patient.  Patient needs surgery clearance. Surgery is pending. Patient was seen on 07/02/23. Office protocol is a risk assessment can be sent to surgeon if patient has been seen in 60 days or less.   Sending to [facility] for risk assessment or recommendations if patient needs to be seen in office prior to surgical procedure.

## 2023-08-20 NOTE — Telephone Encounter (Signed)
 Patient will be considered intermediate risk for prolonged mechanical ventilation and/or postop pulmonary occasions due to history of restrictive lung disease and OSA.  As long as patient has not had recent respiratory infection since last OV in January requiring antibiotics or prednisone she is optimized from surgery from pulmonary standpoint.  Recommend surgery be done in inpatient setting.

## 2023-08-20 NOTE — Telephone Encounter (Signed)
 Called patient to schedule pre-op evaluation- patient reports she was told they were not going to do her surgery.

## 2023-08-20 NOTE — Telephone Encounter (Signed)
 Copy of this note was faxed to [facility].

## 2023-08-21 ENCOUNTER — Encounter: Payer: Self-pay | Admitting: Gastroenterology

## 2023-08-21 ENCOUNTER — Ambulatory Visit: Payer: Medicare Other | Admitting: Gastroenterology

## 2023-08-21 VITALS — BP 120/80 | HR 92 | Ht 62.5 in | Wt 154.0 lb

## 2023-08-21 DIAGNOSIS — K5900 Constipation, unspecified: Secondary | ICD-10-CM

## 2023-08-21 DIAGNOSIS — K641 Second degree hemorrhoids: Secondary | ICD-10-CM

## 2023-08-21 NOTE — Patient Instructions (Signed)

## 2023-08-21 NOTE — Progress Notes (Signed)
 Chief Complaint:    Symptomatic Internal Hemorrhoids; Hemorrhoid Band Ligation   HPI:     Terri Barajas is a 78 y.o. female with a pmh significant for HTN, Glaucoma, MVP, GERD, previous diagnosis Barrett's esophagus (negative on recent 2020 and 2023 EGD), SIBO (previously diagnosed via breath test), constipation, hemorrhoids, referred to me by Dr. Meridee Score to evaluate for evaluation and treatment of symptomatic hemorrhoids (pruritus ani, irritation) with consideration for hemorrhoid banding. The patient presents with symptomatic grade 2  hemorrhoids, unresponsive to maximal medical therapy, requesting rubber band ligation of symptomatic hemorrhoidal disease.   She recalls having hemorrhoid banding done at some point in the past prior to moving to [place].  Most recent colonoscopy in 2023 consistent with internal/external hemorrhoids, and she would like to proceed with banding today.   Constipation generally controlled after increasing MiraLAX to twice daily at her last appointment with Dr. Meridee Score on 05/02/2023.    -07/17/2023: Banding of LL hemorrhoid  Did well with the first hemorrhoid banding.  Still with intermittent hemorrhoidal symptoms.  No change in medical or surgical history, medications, allergies, social history since last appointment with me.   Review of systems:     No chest pain, no SOB, no fevers, no urinary sx   Past Medical History:  Diagnosis Date   Anxiety    Arthritis    Complication of anesthesia    some type of sedation medication made her feel crazy   Dysrhythmia    Family history of adverse reaction to anesthesia    daughter has Malignant Hyperthermia hx -    GERD (gastroesophageal reflux disease)    Glaucoma    H/O: hysterectomy 1978   Heart disease    History of hiatal hernia    Hypertension    MVP (mitral valve prolapse)    Panic attacks    Pre-diabetes    Sciatica     Patient's surgical history, family medical history, social  history, medications and allergies were all reviewed in Epic    Current Outpatient Medications  Medication Sig Dispense Refill   acetaminophen (TYLENOL) 500 MG tablet Take 500-1,000 mg by mouth every 6 (six) hours as needed for moderate pain.     ALPRAZolam (XANAX) 0.25 MG tablet Take 1 tablet (0.25 mg total) by mouth daily as needed for anxiety. 30 tablet 0   ascorbic acid (VITAMIN C) 500 MG tablet Take 500 mg by mouth daily.     aspirin 81 MG chewable tablet Chew 81 mg by mouth daily.     benzonatate (TESSALON) 100 MG capsule Take 1 capsule (100 mg total) by mouth 2 (two) times daily as needed for cough. 60 capsule 1   Biotin 5 MG CAPS Take 5 mg by mouth daily.     calcium carbonate (OSCAL) 1500 (600 Ca) MG TABS tablet Take 600 mg of elemental calcium by mouth daily.     cholecalciferol (VITAMIN D3) 25 MCG (1000 UNIT) tablet Take 3,000 Units by mouth daily.     Cyanocobalamin (VITAMIN B 12) 100 MCG LOZG Take 1 tablet by mouth daily.     diltiazem (CARDIZEM) 30 MG tablet TAKE 1 TABLET BY MOUTH TWICE  DAILY 60 tablet 2   dorzolamide-timolol (COSOPT) 22.3-6.8 MG/ML ophthalmic solution Apply 1 drop to eye in the morning and at bedtime.     Evolocumab (REPATHA SURECLICK) 140 MG/ML SOAJ INJECT 140 MG INTO THE SKIN EVERY 14 (FOURTEEN) DAYS. 6 mL 0   fluticasone (FLONASE) 50 MCG/ACT nasal spray Place 1  spray into both nostrils daily. 16 g 0   guaiFENesin (MUCINEX) 600 MG 12 hr tablet Take 1 tablet (600 mg total) by mouth 2 (two) times daily as needed. 60 tablet 1   hydrochlorothiazide (HYDRODIURIL) 25 MG tablet TAKE 1 TABLET BY MOUTH DAILY 100 tablet 2   hydrocortisone (ANUSOL-HC) 25 MG suppository Place 1 suppository (25 mg total) rectally 2 (two) times daily as needed for hemorrhoids or anal itching. 24 suppository 0   ibuprofen (ADVIL) 200 MG tablet Take 200-400 mg by mouth every 6 (six) hours as needed for moderate pain.     latanoprost (XALATAN) 0.005 % ophthalmic solution Place 1 drop into both  eyes at bedtime.      loratadine (CLARITIN REDITABS) 10 MG dissolvable tablet Take 10 mg by mouth daily as needed for allergies.     Multiple Vitamins-Minerals (CENTRUM SILVER 50+WOMEN PO) Take 1 tablet by mouth daily.     omeprazole (PRILOSEC) 20 MG capsule Take 20 mg by mouth 2 (two) times daily before a meal.     polyethylene glycol (MIRALAX / GLYCOLAX) 17 g packet Take 17 g by mouth daily as needed for moderate constipation. 14 each 1   Probiotic Product (ALIGN PO) Take 1 capsule by mouth daily.     RHOPRESSA 0.02 % SOLN Place 1 drop into both eyes every evening.     vitamin E 180 MG (400 UNITS) capsule Take 400 Units by mouth daily.     No current facility-administered medications for this visit.    Physical Exam:     BP 120/80 (BP Location: Left Arm, Patient Position: Sitting, Cuff Size: Normal)   Pulse 92   Ht 5' 2.5" (1.588 m)   Wt 154 lb (69.9 kg)   LMP  (LMP Unknown)   BMI 27.72 kg/m   GENERAL:  Pleasant female in NAD PSYCH: : Cooperative, normal affect NEURO: Alert and oriented x 3, no focal neurologic deficits Rectal exam: Sensation intact and preserved anal wink.  Grade 2 hemorrhoids noted RP and RA positions on exam.  No external anal fissures noted. Normal sphincter tone. No palpable mass. No blood on the exam glove. (Chaperone: Crecencio Mc, CMA).   IMPRESSION and PLAN:    #1.  Symptomatic internal hemorrhoids: PROCEDURE NOTE: The patient presents with symptomatic grade 2 hemorrhoids, unresponsive to maximal medical therapy, requesting rubber band ligation of symptomatic hemorrhoidal disease.  All risks, benefits and alternative forms of therapy were described and informed consent was obtained.  In the Left Lateral Decubitus position, anoscopic examination revealed grade 2 hemorrhoids in the RP and RA position(s).  The anorectum was pre-medicated with RectiCare. The decision was made to band the RP internal hemorrhoid, and the [facility] O'Regan System was used to  perform band ligation without complication.  Digital anorectal examination was then performed to assure proper positioning of the band, and to adjust the banded tissue as required.  The patient was discharged home without pain or other issues.  Dietary and behavioral recommendations were given and along with follow-up instructions.    The patient will return in 4+ weeks for follow-up and possible additional banding as required. No complications were encountered and the patient tolerated the procedure well.   #2.  Chronic constipation - Continue MiraLAX as prescribed with goal of soft stools without straining to have BM - Continue adequate hydration - Continue fiber supplement - Continue follow-up with Dr. Rachel Moulds ,DO, [facility] 08/21/2023, 10:29 AM

## 2023-08-25 ENCOUNTER — Other Ambulatory Visit: Payer: Self-pay | Admitting: Cardiovascular Disease

## 2023-09-19 ENCOUNTER — Other Ambulatory Visit: Payer: Self-pay | Admitting: Nurse Practitioner

## 2023-09-19 DIAGNOSIS — I10 Essential (primary) hypertension: Secondary | ICD-10-CM

## 2023-09-23 ENCOUNTER — Other Ambulatory Visit: Payer: Self-pay

## 2023-09-23 MED ORDER — DILTIAZEM HCL 30 MG PO TABS: 30.0000 mg | ORAL_TABLET | Freq: Two times a day (BID) | ORAL | 0 refills | Status: DC

## 2023-09-24 ENCOUNTER — Ambulatory Visit: Payer: Medicare Other | Admitting: Primary Care

## 2023-09-24 ENCOUNTER — Encounter: Payer: Self-pay | Admitting: Primary Care

## 2023-09-24 VITALS — BP 126/79 | HR 84 | Temp 98.4°F | Ht 63.0 in | Wt 156.2 lb

## 2023-09-24 DIAGNOSIS — J849 Interstitial pulmonary disease, unspecified: Secondary | ICD-10-CM

## 2023-09-24 MED ORDER — BENZONATATE 100 MG PO CAPS: 100.0000 mg | ORAL_CAPSULE | Freq: Two times a day (BID) | ORAL | 1 refills | Status: AC | PRN

## 2023-09-24 NOTE — Progress Notes (Signed)
 @Patient  ID: Terri Barajas, female    DOB: 1946-03-29, 77 y.o.   MRN: [id number]  Chief Complaint  Patient presents with   Follow-up    CPAP F/U    Referring provider: Arnette Felts, FNP  HPI: 78 year old female.  Medical history significant for interstitial pulmonary disease, hiatal hernia, chronic hypoxic respiratory failure, LPR, OSA on CPAP, hypertension, aortic arthrosclerosis, mitral valve prolapse, hyperlipidemia, prediabetes.   09/24/2023- interim hx  Discussed the use of AI scribe software for clinical note transcription with the patient, who gave verbal consent to proceed.  History of Present Illness   Terri Barajas is a 78 year old female with interstitial pulmonary disease, chronic hypoxic respiratory failure, and obstructive sleep apnea who presents for follow-up.  She has a history of interstitial pulmonary disease with mild fibrosis likely related to a COVID-19 infection in January 2021. She was treated with monoclonal antibodies and did not require hospitalization. Breathing tests from that year showed mild restrictive lung disease with lung function at 77% predicted. A CT scan revealed mild scarring in the lungs, contributing to the restrictive lung disease. A recent CT scan shows stable mild fibrosis compared to March of the previous year.  She has obstructive sleep apnea and uses a CPAP machine every night, with 100% compliance, averaging five and a half hours per night. The CPAP is set on auto with a minimum pressure of 5 and a maximum of 13, with an average pressure of 10.8 and occasionally reaching 12.8. Her apneas are well controlled with this setup, and she reports significant benefit, stating she sleeps 'so well' and sometimes does not want to get up in the morning. She has not missed a night without the CPAP due to fear of potential consequences. Her original sleep study in February 2020 showed moderate obstructive sleep apnea with a BMI of 26, which is now  27. She experiences dry mouth, likely due to low humidification settings on her CPAP.  She experiences shortness of breath when walking and a persistent cough. She is taking benzonatate for the cough, which she finds helpful.  She engages in some physical activity, such as riding a bike, but is limited by knee issues, which prevent her from walking extensively or climbing stairs.  Repeat HRCT imaging in March 2025 showed mild basilar subpleural fibrosis, stable from 09/10/22.        Allergies  Allergen Reactions   Shellfish Allergy Anaphylaxis and Shortness Of Breath   Atorvastatin     Myalgias    Atracurium & Derivatives Other (See Comments)    Unknown   Atropine Other (See Comments)    Heart racing   Iodinated Contrast Media    Iodine Other (See Comments)    Unknown   Statins     "didn't feel right"     Immunization History  Administered Date(s) Administered   Fluad Quad(high Dose 65+) 03/11/2019, 03/27/2022   Influenza, High Dose Seasonal PF 04/29/2018, 02/26/2021   Influenza-Unspecified 03/16/2020, 02/01/2023   PFIZER Comirnaty(Gray Top)Covid-19 Tri-Sucrose Vaccine 12/26/2020   PFIZER(Purple Top)SARS-COV-2 Vaccination 09/02/2019, 09/28/2019, 03/27/2020, 04/14/2021   Pfizer Covid-19 Vaccine Bivalent Booster 61yrs & up 04/14/2021   Pneumococcal Conjugate-13 03/11/2019   Pneumococcal Polysaccharide-23 06/09/2017   Tdap 02/10/2021   Unspecified SARS-COV-2 Vaccination 04/13/2022, 03/14/2023   Zoster Recombinant(Shingrix) 07/12/2021, 07/22/2022    Past Medical History:  Diagnosis Date   Anxiety    Arthritis    Complication of anesthesia    some type of sedation medication made her feel  crazy   Dysrhythmia    Family history of adverse reaction to anesthesia    daughter has Malignant Hyperthermia hx -    GERD (gastroesophageal reflux disease)    Glaucoma    H/O: hysterectomy 1978   Heart disease    History of hiatal hernia    Hypertension    MVP (mitral valve  prolapse)    Panic attacks    Pre-diabetes    Sciatica     Tobacco History: Social History   Tobacco Use  Smoking Status Never   Passive exposure: Never  Smokeless Tobacco Never   Counseling given: Not Answered   Outpatient Medications Prior to Visit  Medication Sig Dispense Refill   acetaminophen (TYLENOL) 500 MG tablet Take 500-1,000 mg by mouth every 6 (six) hours as needed for moderate pain.     ALPRAZolam (XANAX) 0.25 MG tablet Take 1 tablet (0.25 mg total) by mouth daily as needed for anxiety. 30 tablet 0   ascorbic acid (VITAMIN C) 500 MG tablet Take 500 mg by mouth daily.     aspirin 81 MG chewable tablet Chew 81 mg by mouth daily.     benzonatate (TESSALON) 100 MG capsule Take 1 capsule (100 mg total) by mouth 2 (two) times daily as needed for cough. 60 capsule 1   Biotin 5 MG CAPS Take 5 mg by mouth daily.     calcium carbonate (OSCAL) 1500 (600 Ca) MG TABS tablet Take 600 mg of elemental calcium by mouth daily.     cholecalciferol (VITAMIN D3) 25 MCG (1000 UNIT) tablet Take 3,000 Units by mouth daily.     Cyanocobalamin (VITAMIN B 12) 100 MCG LOZG Take 1 tablet by mouth daily.     diltiazem (CARDIZEM) 30 MG tablet Take 1 tablet (30 mg total) by mouth 2 (two) times daily. 180 tablet 0   dorzolamide-timolol (COSOPT) 22.3-6.8 MG/ML ophthalmic solution Apply 1 drop to eye in the morning and at bedtime.     Evolocumab (REPATHA SURECLICK) 140 MG/ML SOAJ INJECT 140 MG INTO THE SKIN EVERY 14 (FOURTEEN) DAYS. 6 mL 0   fluticasone (FLONASE) 50 MCG/ACT nasal spray Place 1 spray into both nostrils daily. 16 g 0   guaiFENesin (MUCINEX) 600 MG 12 hr tablet Take 1 tablet (600 mg total) by mouth 2 (two) times daily as needed. 60 tablet 1   hydrochlorothiazide (HYDRODIURIL) 25 MG tablet TAKE 1 TABLET BY MOUTH DAILY 100 tablet 2   hydrocortisone (ANUSOL-HC) 25 MG suppository Place 1 suppository (25 mg total) rectally 2 (two) times daily as needed for hemorrhoids or anal itching. 24  suppository 0   ibuprofen (ADVIL) 200 MG tablet Take 200-400 mg by mouth every 6 (six) hours as needed for moderate pain.     latanoprost (XALATAN) 0.005 % ophthalmic solution Place 1 drop into both eyes at bedtime.      loratadine (CLARITIN REDITABS) 10 MG dissolvable tablet Take 10 mg by mouth daily as needed for allergies.     Multiple Vitamins-Minerals (CENTRUM SILVER 50+WOMEN PO) Take 1 tablet by mouth daily.     omeprazole (PRILOSEC) 20 MG capsule Take 20 mg by mouth 2 (two) times daily before a meal.     polyethylene glycol (MIRALAX / GLYCOLAX) 17 g packet Take 17 g by mouth daily as needed for moderate constipation. 14 each 1   Probiotic Product (ALIGN PO) Take 1 capsule by mouth daily.     RHOPRESSA 0.02 % SOLN Place 1 drop into both eyes every evening.  vitamin E 180 MG (400 UNITS) capsule Take 400 Units by mouth daily.     No facility-administered medications prior to visit.   Review of Systems  Review of Systems  Constitutional: Negative.   HENT: Negative.    Respiratory:  Positive for cough.   Cardiovascular: Negative.    Physical Exam  BP 126/79 (BP Location: Right Arm, Patient Position: Sitting, Cuff Size: Large)   Pulse 84   Temp 98.4 F (36.9 C) (Oral)   Ht 5\' 3"  (1.6 m)   Wt 156 lb 3.2 oz (70.9 kg)   LMP  (LMP Unknown)   SpO2 98%   BMI 27.67 kg/m  Physical Exam Constitutional:      General: She is not in acute distress.    Appearance: Normal appearance. She is not ill-appearing.  HENT:     Head: Normocephalic and atraumatic.  Cardiovascular:     Rate and Rhythm: Normal rate and regular rhythm.  Pulmonary:     Effort: Pulmonary effort is normal.     Breath sounds: Normal breath sounds. No wheezing, rhonchi or rales.  Musculoskeletal:        General: Normal range of motion.  Skin:    General: Skin is warm and dry.  Neurological:     General: No focal deficit present.     Mental Status: She is alert and oriented to person, place, and time. Mental  status is at baseline.      Lab Results:  CBC    Component Value Date/Time   WBC 7.4 11/13/2022 1151   RBC 4.32 11/13/2022 1151   HGB 12.8 11/13/2022 1151   HGB 12.9 07/22/2022 1056   HCT 39.7 11/13/2022 1151   HCT 39.9 07/22/2022 1056   PLT 262 11/13/2022 1151   PLT 251 07/22/2022 1056   MCV 91.9 11/13/2022 1151   MCV 92 07/22/2022 1056   MCH 29.6 11/13/2022 1151   MCHC 32.2 11/13/2022 1151   RDW 11.8 11/13/2022 1151   RDW 12.3 07/22/2022 1056   LYMPHSABS 1,739 11/13/2022 1151   MONOABS 0.5 02/15/2020 1045   EOSABS 252 11/13/2022 1151   BASOSABS 37 11/13/2022 1151    BMET    Component Value Date/Time   NA 141 05/29/2023 1138   K 3.8 05/29/2023 1138   CL 98 05/29/2023 1138   CO2 27 05/29/2023 1138   GLUCOSE 111 (H) 05/29/2023 1138   GLUCOSE 71 11/13/2022 1151   BUN 20 05/29/2023 1138   CREATININE 0.81 05/29/2023 1138   CREATININE 0.80 11/13/2022 1151   CALCIUM 10.0 05/29/2023 1138   GFRNONAA >60 08/16/2022 1907   GFRAA 83 03/01/2020 1106    BNP No results found for: "BNP"  ProBNP No results found for: "PROBNP"  Imaging: No results found.   Assessment & Plan:   Assessment and Plan    Interstitial Pulmonary Disease Mild interstitial pulmonary fibrosis, likely post-COVID-19, stable with no progression. Normal lung function in 2022. Dyspnea on exertion and cough managed with benzonatate. Activity limited by knee issues, but remains active with biking. - Refill benzonatate for cough. - Repeat pulmonary function test at next visit.  Chronic Hypoxic Respiratory Failure Managed as part of interstitial pulmonary disease. No acute changes. Not currently on supplemental oxygen, O2 98% RA.   Obstructive Sleep Apnea Well controlled with CPAP therapy. 100% compliant, averaging 5.5 hours/night. She reports benefit from wearing CPAP nightly. Current pressure 5-13cm h20. Apnea score <1 event/hour. Discussed weight's role in sleep apnea and potential improvement  with weight loss  or surgery. Not interested in repeating sleep study. Reports dry mouth due to low humidification. - Continue CPAP therapy with current settings. - Increase humidification on CPAP to address dry mouth. - Send MyChart message if issues with CPAP settings.       Glenford Bayley, NP 09/24/2023

## 2023-09-24 NOTE — Patient Instructions (Addendum)
 -  INTERSTITIAL PULMONARY DISEASE: This condition involves scarring in the lungs, likely due to a past COVID-19 infection, which can cause breathing difficulties. Your condition is stable with no progression. We will continue to manage your cough with benzonatate, and you should continue your physical activities as tolerated. We will repeat your pulmonary function test at your next visit.  -CHRONIC HYPOXIC RESPIRATORY FAILURE: This condition means your body is not getting enough oxygen due to your lung disease. There are no acute changes, and it will be managed as part of your interstitial pulmonary disease.  -OBSTRUCTIVE SLEEP APNEA: This condition causes you to stop breathing for short periods during sleep. Your sleep apnea is well controlled with your CPAP machine, which you are using correctly. To help with your dry mouth, we recommend increasing the humidification on your CPAP. If you have any issues with your CPAP settings, please send a message through MyChart.  INSTRUCTIONS:  Please continue using your CPAP machine every night and increase the humidification to help with your dry mouth. Refill your benzonatate prescription for your cough. We will repeat your pulmonary function test at your next visit. If you have any issues with your CPAP settings, send a message through MyChart.  Follow-up 6 month follow up with Dr. Isaiah Serge 1 hour PFT prior

## 2023-10-10 ENCOUNTER — Ambulatory Visit (HOSPITAL_BASED_OUTPATIENT_CLINIC_OR_DEPARTMENT_OTHER): Payer: Medicare Other | Admitting: Cardiovascular Disease

## 2023-10-10 ENCOUNTER — Encounter (HOSPITAL_BASED_OUTPATIENT_CLINIC_OR_DEPARTMENT_OTHER): Payer: Self-pay | Admitting: Cardiovascular Disease

## 2023-10-10 VITALS — BP 130/72 | HR 78 | Ht 63.0 in | Wt 154.2 lb

## 2023-10-10 DIAGNOSIS — I341 Nonrheumatic mitral (valve) prolapse: Secondary | ICD-10-CM | POA: Diagnosis not present

## 2023-10-10 DIAGNOSIS — I10 Essential (primary) hypertension: Secondary | ICD-10-CM | POA: Diagnosis not present

## 2023-10-10 DIAGNOSIS — R5383 Other fatigue: Secondary | ICD-10-CM

## 2023-10-10 DIAGNOSIS — I700 Atherosclerosis of aorta: Secondary | ICD-10-CM | POA: Diagnosis not present

## 2023-10-10 DIAGNOSIS — G4733 Obstructive sleep apnea (adult) (pediatric): Secondary | ICD-10-CM | POA: Diagnosis not present

## 2023-10-10 NOTE — Progress Notes (Signed)
 Cardiology Office Note:  .   Date:  10/10/2023  ID:  Terri Barajas 1945/08/21, MRN [id number] PCP: Terri Epley, FNP  [facility] Providers Cardiologist:  Maudine Sos, MD    History of Present Illness: .    Terri Barajas is a 78 y.o. female with hypertension, hyperlipidemia, OSA on CPAP,  Barrett's esophagus here for follow up.  She was initially seen 06/2018 for the evaluation of abnormal EKG.  [name] has a long history of mitral valve prolapse and was seeing a cardiologist in [place]  before moving to [place] .  She has had this diagnosis for at least 10 years.  She also has been treated with nadolol  for palpitations.  She does not recall being told she had any specific arrhythmia diagnosis.  The nadolol  has controlled it well.  According to her outside records she had an echo 09/2017 that revealed LVEF 60 to 65% with trivial mitral regurgitation and mild tricuspid regurgitation. No mitral valve prolapse was noted on this echo.  She reports having a stress test in 2019 that was performed to follow-up her mitral valve disease but she does not recall having any symptoms of chest pain or shortness of breath at the time.  She had an EKG in January 2020 that revealed anterior T wave inversions.  At the time she was being seen in the ED for epigastric and arm pain.  She has a longstanding history of intermittent abdominal distention and bloating.  She is has undergone upper endoscopies and biopsies that revealed Barrett's esophagus gastritis.  On that particular day her epigastric discomfort was associated with left arm pain.  Cardiac enzymes were negative.  She was instructed to follow-up with cardiology as an outpatient. [name] was referred for a cardiac CT-A but it could not be performed due to tachycardia.  Instead she had a Lexiscan  Myoview  08/2018 that revealed LVEF 64% and no ischemia.   [name] was not taking her diuretic because she did not  like how it made her go to the bathroom so often.  Therefore hydrochlorothiazide  was switched to amlodipine , as her blood pressure was also elevated.  She followed up with our pharmacist 05/2019 and her blood pressure was well have been controlled.  She switched back to HCTZ because she didn't like how amlodipine  made her feel.  She started taking the hydrochlorothiazide  daily and no longer has issues with frequent urination.  The morning nadolol  was switched to metoprolol  due to insurance reasons.  She had headache so this was changed to diltiazem . She became positive for COVID-19.  She and her husband were both treated at the outpatient [facility] infusion center.  She was started on diltiazem  for tachycardia. She hasn't noticed any change in her heart rate.   She had a calcium  score 10/2020 that was 0.  She had chest pain when she saw [name] and was referred for coronary CT-A 08/2022 which revealed a single coronary from the right cusp.  There was not a malignant course and she had no CAD.   Discussed the use of AI scribe software for clinical note transcription with the patient, who gave verbal consent to proceed.  History of Present Illness Terri Barajas is a 78 year old female who presents for cardiovascular evaluation and management.  She has a history of knee arthritis, which has worsened following disc surgery. Despite receiving cortisone and gel injections, undergoing physical therapy, and having fluid aspirated from the knee, she continues to experience  persistent pain and limited mobility. This significantly impacts her ability to exercise and walk, leading to frustration.  Her blood pressure is generally well-controlled with hydrochlorothiazide , though she notes occasional lower readings at home, possibly due to rushing. She describes her diet as inconsistent, preferring home-cooked meals and having reduced her intake of sweets and ice cream, though she occasionally consumes  chips.  She experiences fatigue, which she attributes to an irregular sleep schedule, typically going to bed between 11 PM and 1 AM. She uses a CPAP machine for sleep apnea and is compliant with its use. No recent palpitations are noted, and she continues to take diltiazem  for this issue.  Her current medications include hydrochlorothiazide  for hypertension, Repatha  for hyperlipidemia, and aspirin.   ROS:  As per HPI  Studies Reviewed: [name]       Coronary CT-A 08/2022:   IMPRESSION: 1. Calcium  score 0   2. Normal single coronary artery coming off the right cusp with the LAD going anterior to the RVOT and the LCX going posterior to the aorta Neither in a malignant course  3.  Normal ascending thoracic aorta 3.0 cm   Risk Assessment/Calculations:             Physical Exam:   VS:  BP 130/72   Pulse 78   Ht 5\' 3"  (1.6 m)   Wt 154 lb 3.2 oz (69.9 kg)   LMP  (LMP Unknown)   SpO2 98%   BMI 27.32 kg/m  , BMI Body mass index is 27.32 kg/m. GENERAL:  Well appearing HEENT: Pupils equal round and reactive, fundi not visualized, oral mucosa unremarkable NECK:  No jugular venous distention, waveform within normal limits, carotid upstroke brisk and symmetric, no bruits, no thyromegaly LUNGS:  Clear to auscultation bilaterally HEART:  RRR.  PMI not displaced or sustained,S1 and S2 within normal limits, no S3, no S4, no clicks, no rubs, no murmurs ABD:  Flat, positive bowel sounds normal in frequency in pitch, no bruits, no rebound, no guarding, no midline pulsatile mass, no hepatomegaly, no splenomegaly EXT:  2 plus pulses throughout, no edema, no cyanosis no clubbing SKIN:  No rashes no nodules NEURO:  Cranial nerves II through XII grossly intact, motor grossly intact throughout PSYCH:  Cognitively intact, oriented to person place and time   ASSESSMENT AND PLAN: .    Assessment & Plan   # Palpitations Palpitations controlled with diltiazem , aiding blood pressure control. -  Continue diltiazem  as prescribed.  # Hypertension Blood pressure well-controlled with current regimen. Occasional low readings possibly due to rushing. - Continue hydrochlorothiazide  as prescribed. - Monitor blood pressure at home.  # Hyperlipidemia Cholesterol improved with Repatha . CT scan showed no coronary plaque, indicating good control. - Continue Repatha  as prescribed.  # Knee osteoarthritis Chronic knee pain unresponsive to previous treatments. Advised to avoid knee pressure. - Consider water-based exercises such as swimming or water aerobics. - Explore alternative low-impact exercises like chair yoga.  # Goals of Care Discussed aspirin necessity given absence of heart disease. CT scan and calcium  score show no plaque, reducing aspirin need. Risks include increased bleeding, especially GI bleeding. - Discontinue aspirin.  Follow-up No heart disease, may not need regular cardiology follow-up. - Schedule follow-up in one year or sooner if new symptoms such as chest pain or shortness of breath occur.     Dispo: f/u in 1 year  Signed, Maudine Sos, MD

## 2023-10-10 NOTE — Patient Instructions (Signed)
 Medication Instructions:  STOP ASPIRIN   *If you need a refill on your cardiac medications before your next appointment, please call your pharmacy*  Lab Work: TSH TODAY   Testing/Procedures: NONE  Follow-Up: At [facility], you and your health needs are our priority.  As part of our continuing mission to provide you with exceptional heart care, our providers are all part of one team.  This team includes your primary Cardiologist (physician) and Advanced Practice Providers or APPs (Physician Assistants and Nurse Practitioners) who all work together to provide you with the care you need, when you need it.  Your next appointment:   12 month(s)  Provider:   Maudine Sos, MD, Slater Duncan, NP, or Neomi Banks, NP     We recommend signing up for the patient portal called "MyChart".  Sign up information is provided on this After Visit Summary.  MyChart is used to connect with patients for Virtual Visits (Telemedicine).  Patients are able to view lab/test results, encounter notes, upcoming appointments, etc.  Non-urgent messages can be sent to your provider as well.   To learn more about what you can do with MyChart, go to [email or web address].

## 2023-10-11 LAB — TSH: TSH: 1.09 u[IU]/mL (ref 0.450–4.500)

## 2023-10-14 ENCOUNTER — Telehealth: Payer: Self-pay | Admitting: Primary Care

## 2023-10-14 NOTE — Telephone Encounter (Signed)
 Cmn received from [employer] for CPAP supplies.

## 2023-10-17 NOTE — Telephone Encounter (Signed)
 CMN faxed successfully and signed.

## 2023-10-21 DIAGNOSIS — H1132 Conjunctival hemorrhage, left eye: Secondary | ICD-10-CM | POA: Diagnosis not present

## 2023-10-22 ENCOUNTER — Encounter (HOSPITAL_BASED_OUTPATIENT_CLINIC_OR_DEPARTMENT_OTHER): Payer: Self-pay | Admitting: Cardiovascular Disease

## 2023-10-24 ENCOUNTER — Other Ambulatory Visit: Payer: Self-pay | Admitting: Cardiovascular Disease

## 2023-10-24 DIAGNOSIS — E782 Mixed hyperlipidemia: Secondary | ICD-10-CM

## 2023-10-24 DIAGNOSIS — I700 Atherosclerosis of aorta: Secondary | ICD-10-CM

## 2023-10-26 ENCOUNTER — Other Ambulatory Visit: Payer: Self-pay

## 2023-10-26 ENCOUNTER — Ambulatory Visit
Admission: EM | Admit: 2023-10-26 | Discharge: 2023-10-26 | Disposition: A | Discharge status: Home / Self Care | Attending: Physician Assistant | Admitting: Physician Assistant

## 2023-10-26 ENCOUNTER — Encounter: Payer: Self-pay | Admitting: Emergency Medicine

## 2023-10-26 DIAGNOSIS — R142 Eructation: Secondary | ICD-10-CM | POA: Insufficient documentation

## 2023-10-26 DIAGNOSIS — R1013 Epigastric pain: Secondary | ICD-10-CM | POA: Insufficient documentation

## 2023-10-26 DIAGNOSIS — R42 Dizziness and giddiness: Secondary | ICD-10-CM | POA: Diagnosis not present

## 2023-10-26 DIAGNOSIS — K2270 Barrett's esophagus without dysplasia: Secondary | ICD-10-CM | POA: Insufficient documentation

## 2023-10-26 MED ORDER — MECLIZINE HCL 25 MG PO TABS
25.0000 mg | ORAL_TABLET | Freq: Three times a day (TID) | ORAL | 0 refills | Status: AC | PRN
Start: 2023-10-26 — End: ?

## 2023-10-26 NOTE — ED Triage Notes (Signed)
 Pt here for dizziness with nausea x 2 days with HA in back of head into neck; pt sts worse with position change and sudden movement

## 2023-10-26 NOTE — ED Provider Notes (Addendum)
 [name]    CSN: [id number] Arrival date & time: 10/26/23  1427      History   Chief Complaint Chief Complaint  Patient presents with   Dizziness    HPI Terri Barajas is a 78 y.o. female.   Patient here today for evaluation of dizziness and nausea that started 2 days ago.  She reports she has had headache in the back of her head as well.  She reports position change seems to worsen dizziness.  She does not report severe headache.  She has not had any numbness or tingling.  He does not report any recent injury.  The history is provided by the patient.  Dizziness Associated symptoms: headaches and nausea   Associated symptoms: no vomiting     Past Medical History:  Diagnosis Date   Anxiety    Arthritis    Complication of anesthesia    some type of sedation medication made her feel crazy   Dysrhythmia    Family history of adverse reaction to anesthesia    daughter has Malignant Hyperthermia hx -    GERD (gastroesophageal reflux disease)    Glaucoma    H/O: hysterectomy 1978   Heart disease    History of hiatal hernia    Hypertension    MVP (mitral valve prolapse)    Panic attacks    Pre-diabetes    Sciatica     Patient Active Problem List   Diagnosis Date Noted   Flatulence, eructation and gas pain 10/26/2023   Epigastric pain 10/26/2023   Barrett's esophagus without dysplasia 10/26/2023   Osteoarthritis of right knee 08/08/2023   Chronic pain of left knee 06/15/2023   Hemorrhoids 02/03/2023   Prediabetes 07/29/2022   Leg fatigue 07/22/2022   Statin myopathy 07/22/2022   History of arthroscopy of knee 05/14/2022   Other specified postprocedural states 05/14/2022   Globus sensation 01/31/2022   LPRD (laryngopharyngeal reflux disease) 01/31/2022   Tear of medial meniscus of knee 01/08/2022   Colon cancer screening 01/04/2022   Abdominal distention 01/04/2022   Interstitial pulmonary disease (HCC) 12/19/2021   Osteoarthritis of left  hip 10/22/2021   Unilateral primary osteoarthritis, left hip 10/22/2021   Abdominal bloating 09/03/2021   Chronic intermittent hypoxia with obstructive sleep apnea 09/03/2021   Myopathies 03/28/2021   Osteoarthritis of left knee 10/10/2020   Pes anserinus bursitis of left knee 10/10/2020   Trochanteric bursitis of left hip 10/10/2020   Unilateral primary osteoarthritis, left knee 10/10/2020   Body mass index (BMI) 26.0-26.9, adult 07/06/2020   Disc displacement, lumbar 07/06/2020   OSA on CPAP 01/06/2019   Abnormal CT of the abdomen 10/04/2018   Radiculopathy affecting upper extremity, left 07/25/2018   Hyperlipidemia 05/13/2018   Situational anxiety 04/29/2018   MVP (mitral valve prolapse) 04/29/2018   Sleep-disordered breathing 04/29/2018   Family History of Hyperthermia, malignant 04/23/2018   Barrett's esophagus 03/01/2018   Elevated lipase 02/18/2018   Hiatal hernia 02/18/2018   Constipation 02/18/2018   Bloating 02/18/2018   Aortic atherosclerosis (HCC) 01/23/2018   Gastroesophageal reflux disease without esophagitis    Glaucoma    Essential hypertension     Past Surgical History:  Procedure Laterality Date   APPENDECTOMY  1972   BACK SURGERY  07/14/2020   chipped disc   BIOPSY  07/06/2018   Procedure: BIOPSY;  Surgeon: Normie Becton., MD;  Location: [facility]COPY;  Service: Gastroenterology;;   BIOPSY  03/04/2022   Procedure: BIOPSY;  Surgeon: Normie Becton.,  MD;  Location: [facility] ENDOSCOPY;  Service: Gastroenterology;;   CATARACT EXTRACTION Bilateral 2013   CESAREAN SECTION  1976 and 1978   COLONOSCOPY     COLONOSCOPY WITH PROPOFOL  N/A 03/04/2022   Procedure: COLONOSCOPY WITH PROPOFOL ;  Surgeon: Mansouraty, Albino Alu., MD;  Location: [facility] ENDOSCOPY;  Service: Gastroenterology;  Laterality: N/A;   ESOPHAGOGASTRODUODENOSCOPY (EGD) WITH PROPOFOL  N/A 07/06/2018   Procedure: ESOPHAGOGASTRODUODENOSCOPY (EGD) WITH PROPOFOL ;  Surgeon: Brice Campi Albino Alu.,  MD;  Location: [facility] ENDOSCOPY;  Service: Gastroenterology;  Laterality: N/A;   ESOPHAGOGASTRODUODENOSCOPY (EGD) WITH PROPOFOL  N/A 03/04/2022   Procedure: ESOPHAGOGASTRODUODENOSCOPY (EGD) WITH PROPOFOL ;  Surgeon: Brice Campi Albino Alu., MD;  Location: [facility] ENDOSCOPY;  Service: Gastroenterology;  Laterality: N/A;   HAND SURGERY Bilateral    HEMORRHOID SURGERY  2018   KNEE CARTILAGE SURGERY Left 01/28/2022   PLANTAR FASCIA RELEASE Right    POLYPECTOMY  03/04/2022   Procedure: POLYPECTOMY;  Surgeon: Brice Campi Albino Alu., MD;  Location: [facility];  Service: Gastroenterology;;   S/P Hysterectomy   1987   SAVORY DILATION N/A 03/04/2022   Procedure: Dixie Frederickson DILATION;  Surgeon: Normie Becton., MD;  Location: [facility] ENDOSCOPY;  Service: Gastroenterology;  Laterality: N/A;    OB History   No obstetric history on file.      Home Medications    Prior to Admission medications   Medication Sig Start Date End Date Taking? Authorizing Provider  meclizine  (ANTIVERT ) 25 MG tablet Take 1 tablet (25 mg total) by mouth 3 (three) times daily as needed for dizziness. 10/26/23  Yes Vernestine Gondola, PA-C  acetaminophen  (TYLENOL ) 500 MG tablet Take 500-1,000 mg by mouth every 6 (six) hours as needed for moderate pain.    [provider]  ALPRAZolam  (XANAX ) 0.25 MG tablet Take 1 tablet (0.25 mg total) by mouth daily as needed for anxiety. 01/15/22   Susanna Epley, FNP  ascorbic acid (VITAMIN C) 500 MG tablet Take 500 mg by mouth daily.    [provider]  benzonatate  (TESSALON ) 100 MG capsule Take 1 capsule (100 mg total) by mouth 2 (two) times daily as needed for cough. 09/24/23   Antonio Baumgarten, NP  Biotin 5 MG CAPS Take 5 mg by mouth daily.    [provider]  calcium  carbonate (OSCAL) 1500 (600 Ca) MG TABS tablet Take 600 mg of elemental calcium  by mouth daily.    [provider]  cholecalciferol (VITAMIN D3) 25 MCG (1000 UNIT) tablet Take 3,000 Units by mouth daily.     [provider]  Cyanocobalamin  (VITAMIN B 12) 100 MCG LOZG Take 1 tablet by mouth daily. Patient not taking: Reported on 10/10/2023    [provider]  diltiazem  (CARDIZEM ) 30 MG tablet Take 1 tablet (30 mg total) by mouth 2 (two) times daily. 09/23/23   Maudine Sos, MD  dorzolamide-timolol (COSOPT) 22.3-6.8 MG/ML ophthalmic solution Apply 1 drop to eye in the morning and at bedtime.    [provider]  Evolocumab  (REPATHA  SURECLICK) 140 MG/ML SOAJ INJECT 140 MG INTO THE SKIN EVERY 14 (FOURTEEN) DAYS. 10/24/23   Maudine Sos, MD  fluticasone  (FLONASE ) 50 MCG/ACT nasal spray Place 1 spray into both nostrils daily. 03/07/22   Raspet, Erin K, PA-C  guaiFENesin  (MUCINEX ) 600 MG 12 hr tablet Take 1 tablet (600 mg total) by mouth 2 (two) times daily as needed. 09/17/22   Dohmeier, Raoul Byes, MD  hydrochlorothiazide  (HYDRODIURIL ) 25 MG tablet TAKE 1 TABLET BY MOUTH DAILY 09/23/23   Susanna Epley, FNP  hydrocortisone  (ANUSOL -HC) 25 MG  suppository Place 1 suppository (25 mg total) rectally 2 (two) times daily as needed for hemorrhoids or anal itching. 02/03/23   Melodie Spry, NP  ibuprofen  (ADVIL ) 200 MG tablet Take 200-400 mg by mouth every 6 (six) hours as needed for moderate pain.    [provider]  latanoprost (XALATAN) 0.005 % ophthalmic solution Place 1 drop into both eyes at bedtime.     [provider]  loratadine (CLARITIN REDITABS) 10 MG dissolvable tablet Take 10 mg by mouth daily as needed for allergies.    [provider]  Multiple Vitamins-Minerals (CENTRUM SILVER 50+WOMEN PO) Take 1 tablet by mouth daily.    [provider]  omeprazole (PRILOSEC) 20 MG capsule Take 20 mg by mouth 2 (two) times daily before a meal.    [provider]  polyethylene glycol (MIRALAX  / GLYCOLAX ) 17 g packet Take 17 g by mouth daily as needed for moderate constipation. 04/02/23   Melodie Spry, NP  Probiotic Product (ALIGN PO) Take 1 capsule by  mouth daily.    [provider]  RHOPRESSA 0.02 % SOLN Place 1 drop into both eyes every evening. 05/25/20   [provider]  vitamin E 180 MG (400 UNITS) capsule Take 400 Units by mouth daily.    [provider]    Family History Family History  Problem Relation Age of Onset   Pancreatic cancer Mother    Alcohol abuse Father    Asthma Sister    Hypertension Sister    Arthritis Sister    Arthritis Sister    Rheum arthritis Sister    Diabetes Brother    Arthritis Brother    Hyperlipidemia Brother    Hypertension Brother    Glaucoma Brother    Colon polyps Brother    Prostate cancer Brother    Congenital heart disease Brother    Throat cancer Brother    Alcohol abuse Brother    Healthy Daughter    Healthy Son    Colon cancer Neg Hx    Esophageal cancer Neg Hx    Inflammatory bowel disease Neg Hx    Liver disease Neg Hx    Rectal cancer Neg Hx    Stomach cancer Neg Hx     Social History Social History   Tobacco Use   Smoking status: Never    Passive exposure: Never   Smokeless tobacco: Never  Vaping Use   Vaping status: Never Used  Substance Use Topics   Alcohol use: Never   Drug use: Never     Allergies   Shellfish allergy, Atorvastatin , Atracurium & derivatives, Atropine, Iodinated contrast media, Iodine, and Statins   Review of Systems Review of Systems  Constitutional:  Negative for chills and fever.  Eyes:  Negative for discharge and redness.  Gastrointestinal:  Positive for nausea. Negative for abdominal pain and vomiting.  Neurological:  Positive for dizziness and headaches.     Physical Exam Triage Vital Signs ED Triage Vitals  Encounter Vitals Group     BP 10/26/23 1444 (!) 160/91     Systolic BP Percentile --      Diastolic BP Percentile --      Pulse Rate 10/26/23 1444 80     Resp 10/26/23 1444 18     Temp 10/26/23 1444 98.3 F (36.8 C)     Temp Source 10/26/23 1444 Oral     SpO2 10/26/23 1444 95 %      Weight --      Height --  Head Circumference --      Peak Flow --      Pain Score 10/26/23 1445 5     Pain Loc --      Pain Education --      Exclude from Growth Chart --    No data found.  Updated Vital Signs BP 131/78 (BP Location: Left Arm)   Pulse 80   Temp 98.3 F (36.8 C) (Oral)   Resp 18   LMP  (LMP Unknown)   SpO2 95%   Visual Acuity Right Eye Distance:   Left Eye Distance:   Bilateral Distance:    Right Eye Near:   Left Eye Near:    Bilateral Near:     Physical Exam Vitals and nursing note reviewed.  Constitutional:      General: She is not in acute distress.    Appearance: Normal appearance. She is not ill-appearing.  HENT:     Head: Normocephalic and atraumatic.  Eyes:     Extraocular Movements: Extraocular movements intact.     Conjunctiva/sclera: Conjunctivae normal.     Pupils: Pupils are equal, round, and reactive to light.  Cardiovascular:     Rate and Rhythm: Normal rate and regular rhythm.  Pulmonary:     Effort: Pulmonary effort is normal. No respiratory distress.     Breath sounds: No wheezing, rhonchi or rales.  Neurological:     Mental Status: She is alert.     Comments: Normal finger to nose, heel to shin, normal speech, no facial droop  Psychiatric:        Mood and Affect: Mood normal.        Behavior: Behavior normal.        Thought Content: Thought content normal.      UC Treatments / Results  Labs (all labs ordered are listed, but only abnormal results are displayed) Labs Reviewed - No data to display  EKG   Radiology No results found.  Procedures Procedures (including critical care time)  Medications Ordered in UC Medications - No data to display  Initial Impression / Assessment and Plan / UC Course  I have reviewed the triage vital signs and the nursing notes.  Pertinent labs & imaging results that were available during my care of the patient were reviewed by me and considered in my medical decision making  (see chart for details).    Symptoms seem to be vertiginous in nature.  Will treat with meclizine  but advised to use with caution.  Encouraged follow-up in the emergency room if symptoms do not improve or with any worsening.  Patient expressed understanding.  Final Clinical Impressions(s) / UC Diagnoses   Final diagnoses:  Vertigo   Discharge Instructions   None    ED Prescriptions     Medication Sig Dispense Auth. Provider   meclizine  (ANTIVERT ) 25 MG tablet Take 1 tablet (25 mg total) by mouth 3 (three) times daily as needed for dizziness. 30 tablet Vernestine Gondola, PA-C      PDMP not reviewed this encounter.   Vernestine Gondola, PA-C 11/03/23 1640    Vernestine Gondola, PA-C 11/03/23 [phone]

## 2023-10-27 DIAGNOSIS — L658 Other specified nonscarring hair loss: Secondary | ICD-10-CM | POA: Diagnosis not present

## 2023-10-27 DIAGNOSIS — L219 Seborrheic dermatitis, unspecified: Secondary | ICD-10-CM | POA: Diagnosis not present

## 2023-11-05 ENCOUNTER — Encounter: Payer: Self-pay | Admitting: Gastroenterology

## 2023-11-05 ENCOUNTER — Ambulatory Visit: Admitting: Gastroenterology

## 2023-11-05 VITALS — BP 106/66 | HR 94 | Ht 63.0 in | Wt 157.2 lb

## 2023-11-05 DIAGNOSIS — K649 Unspecified hemorrhoids: Secondary | ICD-10-CM

## 2023-11-05 DIAGNOSIS — K641 Second degree hemorrhoids: Secondary | ICD-10-CM | POA: Diagnosis not present

## 2023-11-05 DIAGNOSIS — K5900 Constipation, unspecified: Secondary | ICD-10-CM

## 2023-11-05 MED ORDER — AMBULATORY NON FORMULARY MEDICATION: 0 refills | Status: AC

## 2023-11-05 NOTE — Patient Instructions (Addendum)
 _______________________________________________________  If your blood pressure at your visit was 140/90 or greater, please contact your primary care physician to follow up on this. _______________________________________________________  If you are age 76 or older, your body mass index should be between 23-30. Your Body mass index is 27.86 kg/m. If this is out of the aforementioned range listed, please consider follow up with your Primary Care Provider.  If you are age 65 or younger, your body mass index should be between 19-25. Your Body mass index is 27.86 kg/m. If this is out of the aformentioned range listed, please consider follow up with your Primary Care Provider.   ________________________________________________________  The [place] [facility] providers would like to encourage you to use MYCHART to communicate with providers for non-urgent requests or questions.  Due to long hold times on the telephone, sending your provider a message by [facility] may be a faster and more efficient way to get a response.  Please allow 48 business hours for a response.  Please remember that this is for non-urgent requests.  _______________________________________________________  We have sent a prescription for nitroglycerin  0.125% gel to [facility]. You should apply a pea size amount to your rectum two times daily x 2 weeks.  [facility]'s information is below: Address: [address] [place], [place] [address]  [phone]  *Please DO NOT go directly from our office to pick up this medication! Give the pharmacy 1 day to process the prescription as this is compounded and takes time to make.  HEMORRHOID BANDING PROCEDURE    FOLLOW-UP CARE   The procedure you have had should have been relatively painless since the banding of the area involved does not have nerve endings and there is no pain sensation.  The rubber band cuts off the blood supply to the hemorrhoid and the band may  fall off as soon as 48 hours after the banding (the band may occasionally be seen in the toilet bowl following a bowel movement). You may notice a temporary feeling of fullness in the rectum which should respond adequately to plain Tylenol  or Motrin .  Following the banding, avoid strenuous exercise that evening and resume full activity the next day.  A sitz bath (soaking in a warm tub) or bidet is soothing, and can be useful for cleansing the area after bowel movements.     To avoid constipation, take two tablespoons of natural wheat bran, natural oat bran, flax, Benefiber or any over the counter fiber supplement and increase your water intake to 7-8 glasses daily.    Unless you have been prescribed anorectal medication, do not put anything inside your rectum for two weeks: No suppositories, enemas, fingers, etc.  Occasionally, you may have more bleeding than usual after the banding procedure.  This is often from the untreated hemorrhoids rather than the treated one.  Don't be concerned if there is a tablespoon or so of blood.  If there is more blood than this, lie flat with your bottom higher than your head and apply an ice pack to the area. If the bleeding does not stop within a half an hour or if you feel faint, call our office at [phone] or go to the emergency room.  Problems are not common; however, if there is a substantial amount of bleeding, severe pain, chills, fever or difficulty passing urine (very rare) or other problems, you should call us  at [phone] or report to the nearest emergency room.  Do not stay seated continuously  for more than 2-3 hours for a day or two after the procedure.  Tighten your buttock muscles 10-15 times every two hours and take 10-15 deep breaths every 1-2 hours.  Do not spend more than a few minutes on the toilet if you cannot empty your bowel; instead re-visit the toilet at a later time.   It was a pleasure to see you today!  Vito Cirigliano,  D.O.

## 2023-11-05 NOTE — Progress Notes (Signed)
 Chief Complaint:    Symptomatic Internal Hemorrhoids; Hemorrhoid Band Ligation   HPI:     Terri Barajas is a 78 y.o. female with a pmh significant for HTN, Glaucoma, MVP, GERD, previous diagnosis Barrett's esophagus (negative on recent 2020 and 2023 EGD), SIBO (previously diagnosed via breath test), constipation, hemorrhoids, referred to me by Dr. Brice Campi to evaluate for evaluation and treatment of symptomatic hemorrhoids (pruritus ani, irritation) with consideration for hemorrhoid banding. The patient presents with symptomatic grade 2  hemorrhoids, unresponsive to maximal medical therapy, requesting rubber band ligation of symptomatic hemorrhoidal disease.   She recalls having hemorrhoid banding done at some point in the past prior to moving to [place].  Most recent colonoscopy in 2023 consistent with internal/external hemorrhoids, and she would like to proceed with banding today.   Constipation generally controlled after increasing MiraLAX  to twice daily at her last appointment with Dr. Brice Campi on 05/02/2023.     -07/17/2023: Banding of LL hemorrhoid - 08/21/2023: Banding of RP hemorrhoid  Did have some discomfort after previous hemorrhoid banding which has since resolved.  No change in medical or surgical history, medications, allergies, social history since last appointment with me.   Review of systems:     No chest pain, no SOB, no fevers, no urinary sx   Past Medical History:  Diagnosis Date   Anxiety    Arthritis    Complication of anesthesia    some type of sedation medication made her feel crazy   Dysrhythmia    Family history of adverse reaction to anesthesia    daughter has Malignant Hyperthermia hx -    GERD (gastroesophageal reflux disease)    Glaucoma    H/O: hysterectomy 1978   Heart disease    History of hiatal hernia    Hypertension    MVP (mitral valve prolapse)    Panic attacks    Pre-diabetes    Sciatica     Patient's surgical history,  family medical history, social history, medications and allergies were all reviewed in Epic    Current Outpatient Medications  Medication Sig Dispense Refill   acetaminophen  (TYLENOL ) 500 MG tablet Take 500-1,000 mg by mouth every 6 (six) hours as needed for moderate pain.     ALPRAZolam  (XANAX ) 0.25 MG tablet Take 1 tablet (0.25 mg total) by mouth daily as needed for anxiety. 30 tablet 0   ascorbic acid (VITAMIN C) 500 MG tablet Take 500 mg by mouth daily.     benzonatate  (TESSALON ) 100 MG capsule Take 1 capsule (100 mg total) by mouth 2 (two) times daily as needed for cough. 60 capsule 1   Biotin 5 MG CAPS Take 5 mg by mouth daily.     calcium  carbonate (OSCAL) 1500 (600 Ca) MG TABS tablet Take 600 mg of elemental calcium  by mouth daily.     cholecalciferol (VITAMIN D3) 25 MCG (1000 UNIT) tablet Take 3,000 Units by mouth daily.     Cyanocobalamin  (VITAMIN B 12) 100 MCG LOZG Take 1 tablet by mouth daily.     diltiazem  (CARDIZEM ) 30 MG tablet Take 1 tablet (30 mg total) by mouth 2 (two) times daily. 180 tablet 0   dorzolamide-timolol (COSOPT) 22.3-6.8 MG/ML ophthalmic solution Apply 1 drop to eye in the morning and at bedtime.     Evolocumab  (REPATHA  SURECLICK) 140 MG/ML SOAJ INJECT 140 MG INTO THE SKIN EVERY 14 (FOURTEEN) DAYS. 6 mL 3   fluticasone  (FLONASE ) 50 MCG/ACT nasal spray Place 1 spray into both nostrils daily.  16 g 0   guaiFENesin  (MUCINEX ) 600 MG 12 hr tablet Take 1 tablet (600 mg total) by mouth 2 (two) times daily as needed. 60 tablet 1   hydrochlorothiazide  (HYDRODIURIL ) 25 MG tablet TAKE 1 TABLET BY MOUTH DAILY 100 tablet 2   hydrocortisone  (ANUSOL -HC) 25 MG suppository Place 1 suppository (25 mg total) rectally 2 (two) times daily as needed for hemorrhoids or anal itching. 24 suppository 0   ibuprofen  (ADVIL ) 200 MG tablet Take 200-400 mg by mouth every 6 (six) hours as needed for moderate pain.     latanoprost (XALATAN) 0.005 % ophthalmic solution Place 1 drop into both eyes at  bedtime.      loratadine (CLARITIN REDITABS) 10 MG dissolvable tablet Take 10 mg by mouth daily as needed for allergies.     meclizine  (ANTIVERT ) 25 MG tablet Take 1 tablet (25 mg total) by mouth 3 (three) times daily as needed for dizziness. 30 tablet 0   Multiple Vitamins-Minerals (CENTRUM SILVER 50+WOMEN PO) Take 1 tablet by mouth daily.     omeprazole (PRILOSEC) 20 MG capsule Take 20 mg by mouth 2 (two) times daily before a meal.     polyethylene glycol (MIRALAX  / GLYCOLAX ) 17 g packet Take 17 g by mouth daily as needed for moderate constipation. 14 each 1   Probiotic Product (ALIGN PO) Take 1 capsule by mouth daily.     RHOPRESSA 0.02 % SOLN Place 1 drop into both eyes every evening.     vitamin E 180 MG (400 UNITS) capsule Take 400 Units by mouth daily.     No current facility-administered medications for this visit.    Physical Exam:     BP 106/66   Pulse 94   Ht 5\' 3"  (1.6 m)   Wt 157 lb 4 oz (71.3 kg)   LMP  (LMP Unknown)   BMI 27.86 kg/m   GENERAL:  Pleasant female in NAD PSYCH: : Cooperative, normal affect Rectal exam: Sensation intact and preserved anal wink.  Grade 2 hemorrhoid noted in RA position on exam.   Normal sphincter tone. No palpable mass. No blood on the exam glove. (Chaperone: Linette Rias, CMA).   IMPRESSION and PLAN:    #1.  Symptomatic internal hemorrhoids: PROCEDURE NOTE: The patient presents with symptomatic grade 2 hemorrhoids, unresponsive to maximal medical therapy, requesting rubber band ligation of symptomatic hemorrhoidal disease.  All risks, benefits and alternative forms of therapy were described and informed consent was obtained.  In the Left Lateral Decubitus position, anoscopic examination revealed grade 2 hemorrhoids in the RA position(s).  The anorectum was pre-medicated with RectiCare. The decision was made to band the RA internal hemorrhoid, and the [facility] System was used to perform band ligation without complication.  Digital  anorectal examination was then performed to assure proper positioning of the band, and to adjust the banded tissue as required.  The patient was discharged home without pain or other issues.  Dietary and behavioral recommendations were given and along with follow-up instructions.   No complications were encountered and the patient tolerated the procedure well.   The following adjunctive treatments were recommended:  -Resume high-fiber diet with fiber supplement (i.e. Citrucel or Benefiber) with goal for soft stools without straining to have a BM. -Resume adequate fluid intake. - Sitz baths for the next few days - Topical NTG 0.125% BID for the next 1-2 weeks to reduce post banding pain - She can follow-up with me on an as-needed basis if return of hemorrhoidal  symptoms for reevaluation and additional banding as required.  To otherwise resume her ongoing GI care with Dr. Brice Campi as below     #2.  Chronic constipation - Continue MiraLAX  as prescribed with goal of soft stools without straining to have BM - Continue adequate hydration - Continue fiber supplement - Continue follow-up with Dr. Celestia Colander ,DO, [facility] 11/05/2023, 11:35 AM

## 2023-11-13 ENCOUNTER — Telehealth: Payer: Self-pay

## 2023-11-13 NOTE — Telephone Encounter (Signed)
   Pre-operative Risk Assessment    Patient Name: Terri Barajas  DOB: [birth year] MRN: [id number]   Date of last office visit: 10/10/23 with Dr. Theodis Fiscal Date of next office visit: None   Request for Surgical Clearance    Procedure:  Left Knee Arthroscope  Date of Surgery:  Clearance TBD                                Surgeon:  Dr. Osa Blase Surgeon's Group or Practice Name:  [facility] Phone number:  [phone] Attn: Mark Sil Fax number:  [phone]   Type of Clearance Requested:   - Medical  `  Type of Anesthesia:  General    Additional requests/questions:    Kenny Peals   11/13/2023, 1:42 PM

## 2023-11-17 DIAGNOSIS — M25562 Pain in left knee: Secondary | ICD-10-CM | POA: Diagnosis not present

## 2023-11-17 NOTE — Telephone Encounter (Signed)
   Patient Name: Terri Barajas  DOB: 27-Sep-1945 MRN: [id number]  Primary Cardiologist: Maudine Sos, MD  Chart reviewed as part of pre-operative protocol coverage. Patient was last seen in office on 10/10/23 with Dr. Theodis Fiscal. Per Dr. Theodis Fiscal, "Low risk for surgery.  She has no unstable cardiac disease." Patient may proceed with surgery at appropriate cardiac risk.   I will route this recommendation to the requesting party via Epic fax function and remove from pre-op pool.  Please call with questions.  Averill Winters D Javione Gunawan, NP 11/17/2023, 8:54 AM

## 2023-12-02 ENCOUNTER — Ambulatory Visit: Payer: Medicare Other | Admitting: Nurse Practitioner

## 2023-12-10 ENCOUNTER — Ambulatory Visit (INDEPENDENT_AMBULATORY_CARE_PROVIDER_SITE_OTHER): Admitting: Nurse Practitioner

## 2023-12-10 ENCOUNTER — Encounter: Payer: Self-pay | Admitting: Nurse Practitioner

## 2023-12-10 VITALS — BP 120/82 | HR 93 | Temp 98.5°F | Ht 63.0 in | Wt 156.8 lb

## 2023-12-10 DIAGNOSIS — I10 Essential (primary) hypertension: Secondary | ICD-10-CM | POA: Diagnosis not present

## 2023-12-10 DIAGNOSIS — I700 Atherosclerosis of aorta: Secondary | ICD-10-CM

## 2023-12-10 DIAGNOSIS — E782 Mixed hyperlipidemia: Secondary | ICD-10-CM | POA: Diagnosis not present

## 2023-12-10 DIAGNOSIS — R7303 Prediabetes: Secondary | ICD-10-CM | POA: Diagnosis not present

## 2023-12-10 DIAGNOSIS — F529 Unspecified sexual dysfunction not due to a substance or known physiological condition: Secondary | ICD-10-CM

## 2023-12-10 DIAGNOSIS — Z79899 Other long term (current) drug therapy: Secondary | ICD-10-CM | POA: Diagnosis not present

## 2023-12-10 NOTE — Progress Notes (Signed)
 I,Jameka J Llittleton, CMA,acting as a Neurosurgeon for [employer], FNP.,have documented all relevant documentation on the behalf of Terri Ada, FNP,as directed by  Terri Ada, FNP while in the presence of Terri Ada, FNP.  Subjective:  Patient ID: Terri Barajas , female    DOB: [birth year] , 78 y.o.   MRN: [id number]  Chief Complaint  Patient presents with   Hypertension    Patient presents today for a bp and  predm follow up, Patient reports compliance with medication. Patient denies any chest pain, SOB, or headaches. Patient is concerned about her ankles swelling.   other    Patient reports when she has intercourse with her husband it hurts her really bad.    Knee Pain    She also complains of knee pain.     HPI  She has been to see her orthopedic and had an injection to her knee. She reports the orthopedic informed if that was not effective she may benefit from a knee replacement. Her orthopedic recommended to ride her bike but she had a hemorroid banding so has not been able to do that.   She is paying almost $400 for her Repatha  from Dr. Raford  She has had a hysterectomy. Her painful intercourse has been for the last 3 months. They are not using any type of lubricant. She is staying well hydrated with water. No vaginal discharge.   Hypertension This is a chronic problem. The current episode started more than 1 year ago. The problem is uncontrolled. Pertinent negatives include no anxiety, chest pain or palpitations. There is no history of chronic renal disease.  Hyperlipidemia This is a chronic problem. The current episode started more than 1 year ago. The problem is uncontrolled. She has no history of chronic renal disease. Pertinent negatives include no chest pain. Current antihyperlipidemic treatment includes statins and diet change. Risk factors for coronary artery disease include obesity and a sedentary lifestyle.  Knee Pain  The incident occurred more than 1 week ago. The  pain is present in the left knee (never stops hurting - has appt with Orthopedic next month). The quality of the pain is described as aching and shooting. Pertinent negatives include no inability to bear weight, numbness or tingling. The symptoms are aggravated by movement. She has tried NSAIDs and acetaminophen  (voltaren  gel and exercises) for the symptoms. The treatment provided no relief.     Past Medical History:  Diagnosis Date   Anxiety    Arthritis    Complication of anesthesia    some type of sedation medication made her feel crazy   Dysrhythmia    Family history of adverse reaction to anesthesia    daughter has Malignant Hyperthermia hx -    GERD (gastroesophageal reflux disease)    Glaucoma    H/O: hysterectomy 1978   Heart disease    History of hiatal hernia    Hypertension    MVP (mitral valve prolapse)    Panic attacks    Pre-diabetes    Sciatica      Family History  Problem Relation Age of Onset   Pancreatic cancer Mother    Alcohol abuse Father    Asthma Sister    Hypertension Sister    Arthritis Sister    Arthritis Sister    Rheum arthritis Sister    Diabetes Brother    Arthritis Brother    Hyperlipidemia Brother    Hypertension Brother    Glaucoma Brother    Colon polyps Brother  Prostate cancer Brother    Congenital heart disease Brother    Throat cancer Brother    Alcohol abuse Brother    Healthy Daughter    Healthy Son    Colon cancer Neg Hx    Esophageal cancer Neg Hx    Inflammatory bowel disease Neg Hx    Liver disease Neg Hx    Rectal cancer Neg Hx    Stomach cancer Neg Hx      Current Outpatient Medications:    acetaminophen  (TYLENOL ) 500 MG tablet, Take 500-1,000 mg by mouth every 6 (six) hours as needed for moderate pain., Disp: , Rfl:    ALPRAZolam  (XANAX ) 0.25 MG tablet, Take 1 tablet (0.25 mg total) by mouth daily as needed for anxiety., Disp: 30 tablet, Rfl: 0   AMBULATORY NON FORMULARY MEDICATION, Nitroglycerin  0.125% gel to  [facility]. You should apply a pea size amount to your rectum twice daily for 2 weeks., Disp: 30 g, Rfl: 0   ascorbic acid (VITAMIN C) 500 MG tablet, Take 500 mg by mouth daily., Disp: , Rfl:    benzonatate  (TESSALON ) 100 MG capsule, Take 1 capsule (100 mg total) by mouth 2 (two) times daily as needed for cough., Disp: 60 capsule, Rfl: 1   Biotin 5 MG CAPS, Take 5 mg by mouth daily., Disp: , Rfl:    calcium  carbonate (OSCAL) 1500 (600 Ca) MG TABS tablet, Take 600 mg of elemental calcium  by mouth daily., Disp: , Rfl:    cholecalciferol (VITAMIN D3) 25 MCG (1000 UNIT) tablet, Take 3,000 Units by mouth daily., Disp: , Rfl:    Cyanocobalamin  (VITAMIN B 12) 100 MCG LOZG, Take 1 tablet by mouth daily., Disp: , Rfl:    diltiazem  (CARDIZEM ) 30 MG tablet, Take 1 tablet (30 mg total) by mouth 2 (two) times daily., Disp: 180 tablet, Rfl: 0   dorzolamide-timolol (COSOPT) 22.3-6.8 MG/ML ophthalmic solution, Apply 1 drop to eye in the morning and at bedtime., Disp: , Rfl:    Evolocumab  (REPATHA  SURECLICK) 140 MG/ML SOAJ, INJECT 140 MG INTO THE SKIN EVERY 14 (FOURTEEN) DAYS., Disp: 6 mL, Rfl: 3   fluticasone  (FLONASE ) 50 MCG/ACT nasal spray, Place 1 spray into both nostrils daily., Disp: 16 g, Rfl: 0   guaiFENesin  (MUCINEX ) 600 MG 12 hr tablet, Take 1 tablet (600 mg total) by mouth 2 (two) times daily as needed., Disp: 60 tablet, Rfl: 1   hydrochlorothiazide  (HYDRODIURIL ) 25 MG tablet, TAKE 1 TABLET BY MOUTH DAILY, Disp: 100 tablet, Rfl: 2   hydrocortisone  (ANUSOL -HC) 25 MG suppository, Place 1 suppository (25 mg total) rectally 2 (two) times daily as needed for hemorrhoids or anal itching., Disp: 24 suppository, Rfl: 0   ibuprofen  (ADVIL ) 200 MG tablet, Take 200-400 mg by mouth every 6 (six) hours as needed for moderate pain., Disp: , Rfl:    latanoprost (XALATAN) 0.005 % ophthalmic solution, Place 1 drop into both eyes at bedtime. , Disp: , Rfl:    loratadine (CLARITIN REDITABS) 10 MG dissolvable tablet,  Take 10 mg by mouth daily as needed for allergies., Disp: , Rfl:    meclizine  (ANTIVERT ) 25 MG tablet, Take 1 tablet (25 mg total) by mouth 3 (three) times daily as needed for dizziness., Disp: 30 tablet, Rfl: 0   Multiple Vitamins-Minerals (CENTRUM SILVER 50+WOMEN PO), Take 1 tablet by mouth daily., Disp: , Rfl:    omeprazole (PRILOSEC) 20 MG capsule, Take 20 mg by mouth 2 (two) times daily before a meal., Disp: , Rfl:  polyethylene glycol (MIRALAX  / GLYCOLAX ) 17 g packet, Take 17 g by mouth daily as needed for moderate constipation., Disp: 14 each, Rfl: 1   Probiotic Product (ALIGN PO), Take 1 capsule by mouth daily., Disp: , Rfl:    RHOPRESSA 0.02 % SOLN, Place 1 drop into both eyes every evening., Disp: , Rfl:    vitamin E 180 MG (400 UNITS) capsule, Take 400 Units by mouth daily., Disp: , Rfl:    Allergies  Allergen Reactions   Shellfish Allergy Anaphylaxis and Shortness Of Breath   Atorvastatin      Myalgias    Atracurium & Derivatives Other (See Comments)    Unknown   Atropine Other (See Comments)    Heart racing   Iodinated Contrast Media    Iodine Other (See Comments)    Unknown   Statins     didn't feel right      Review of Systems  Constitutional: Negative.   Respiratory: Negative.    Cardiovascular:  Negative for chest pain, palpitations and leg swelling.  Neurological: Negative.  Negative for tingling and numbness.  Psychiatric/Behavioral: Negative.       Today's Vitals   12/10/23 1134  BP: 120/82  Pulse: 93  Temp: 98.5 F (36.9 C)  TempSrc: Oral  Weight: 156 lb 12.8 oz (71.1 kg)  Height: 5' 3 (1.6 m)  PainSc: 0-No pain   Body mass index is 27.78 kg/m.  Wt Readings from Last 3 Encounters:  12/10/23 156 lb 12.8 oz (71.1 kg)  11/05/23 157 lb 4 oz (71.3 kg)  10/10/23 154 lb 3.2 oz (69.9 kg)     Objective:  Physical Exam Vitals and nursing note reviewed.  Constitutional:      General: She is not in acute distress.    Appearance: Normal  appearance.  Cardiovascular:     Rate and Rhythm: Normal rate and regular rhythm.     Pulses: Normal pulses.     Heart sounds: Normal heart sounds. No murmur heard. Pulmonary:     Effort: Pulmonary effort is normal. No respiratory distress.     Breath sounds: Normal breath sounds. No wheezing.  Skin:    General: Skin is warm and dry.     Capillary Refill: Capillary refill takes less than 2 seconds.  Neurological:     General: No focal deficit present.     Mental Status: She is alert and oriented to person, place, and time.     Cranial Nerves: No cranial nerve deficit.     Motor: No weakness.  Psychiatric:        Mood and Affect: Mood normal.        Behavior: Behavior normal.        Thought Content: Thought content normal.        Judgment: Judgment normal.         Assessment And Plan:  Essential hypertension Assessment & Plan: Blood pressure is well controlled, continue current medications.   Orders: -     CMP14+EGFR  Prediabetes Assessment & Plan: HgbA1c is stable. Continue focusing on healthy diet.   Orders: -     Hemoglobin A1c  Mixed hyperlipidemia Assessment & Plan: Cholesterol levels are stable. She is unable to tolerate statins.   Orders: -     Lipid panel  Aortic atherosclerosis (HCC) Assessment & Plan: Continue statin, tolerating well    Sexual function problem Assessment & Plan: She is having painful urination, worse on insertion. I have given her information on a lubricant to try.  She is post menopausal. If no improvement return call or visit to office.   Other long term (current) drug therapy -     CBC    Return in about 4 months (around 04/10/2024) for HM.  Patient was given opportunity to ask questions. Patient verbalized understanding of the plan and was able to repeat key elements of the plan. All questions were answered to their satisfaction.    LILLETTE Terri Ada, FNP, have reviewed all documentation for this visit. The documentation on  11/2523 for the exam, diagnosis, procedures, and orders are all accurate and complete.   IF YOU HAVE BEEN REFERRED TO A SPECIALIST, IT MAY TAKE 1-2 WEEKS TO SCHEDULE/PROCESS THE REFERRAL. IF YOU HAVE NOT HEARD FROM US /SPECIALIST IN TWO WEEKS, PLEASE GIVE US  A CALL AT [phone].

## 2023-12-10 NOTE — Patient Instructions (Signed)
 You can get over the counter hyola gyn gel to help with moisturizer

## 2023-12-11 LAB — CMP14+EGFR
ALT: 17 IU/L (ref 0–32)
AST: 19 IU/L (ref 0–40)
Albumin: 4.1 g/dL (ref 3.8–4.8)
Alkaline Phosphatase: 86 IU/L (ref 44–121)
BUN/Creatinine Ratio: 19 (ref 12–28)
BUN: 14 mg/dL (ref 8–27)
Bilirubin Total: 0.3 mg/dL (ref 0.0–1.2)
CO2: 27 mmol/L (ref 20–29)
Calcium: 10.1 mg/dL (ref 8.7–10.3)
Chloride: 98 mmol/L (ref 96–106)
Creatinine, Ser: 0.75 mg/dL (ref 0.57–1.00)
Globulin, Total: 2.6 g/dL (ref 1.5–4.5)
Glucose: 117 mg/dL — ABNORMAL HIGH (ref 70–99)
Potassium: 3.9 mmol/L (ref 3.5–5.2)
Sodium: 140 mmol/L (ref 134–144)
Total Protein: 6.7 g/dL (ref 6.0–8.5)
eGFR: 82 mL/min/{1.73_m2} (ref >59)

## 2023-12-11 LAB — CBC
Hematocrit: 40.5 % (ref 34.0–46.6)
Hemoglobin: 12.7 g/dL (ref 11.1–15.9)
MCH: 29.5 pg (ref 26.6–33.0)
MCHC: 31.4 g/dL — ABNORMAL LOW (ref 31.5–35.7)
MCV: 94 fL (ref 79–97)
Platelets: 242 10*3/uL (ref 150–450)
RBC: 4.3 x10E6/uL (ref 3.77–5.28)
RDW: 12.3 % (ref 11.7–15.4)
WBC: 6.3 10*3/uL (ref 3.4–10.8)

## 2023-12-11 LAB — LIPID PANEL
Chol/HDL Ratio: 3.3 ratio (ref 0.0–4.4)
Cholesterol, Total: 171 mg/dL (ref 100–199)
HDL: 52 mg/dL (ref >39)
LDL Chol Calc (NIH): 80 mg/dL (ref 0–99)
Triglycerides: 237 mg/dL — ABNORMAL HIGH (ref 0–149)
VLDL Cholesterol Cal: 39 mg/dL (ref 5–40)

## 2023-12-11 LAB — HEMOGLOBIN A1C
Est. average glucose Bld gHb Est-mCnc: 140 mg/dL
Hgb A1c MFr Bld: 6.5 % — ABNORMAL HIGH (ref 4.8–5.6)

## 2023-12-20 ENCOUNTER — Ambulatory Visit: Payer: Self-pay | Admitting: Nurse Practitioner

## 2023-12-20 ENCOUNTER — Encounter: Payer: Self-pay | Admitting: Nurse Practitioner

## 2023-12-20 DIAGNOSIS — F529 Unspecified sexual dysfunction not due to a substance or known physiological condition: Secondary | ICD-10-CM | POA: Insufficient documentation

## 2023-12-20 NOTE — Assessment & Plan Note (Signed)
 She is having painful urination, worse on insertion. I have given her information on a lubricant to try. She is post menopausal. If no improvement return call or visit to office.

## 2023-12-20 NOTE — Assessment & Plan Note (Signed)
 Blood pressure is well controlled, continue current medications.

## 2023-12-20 NOTE — Assessment & Plan Note (Signed)
 Cholesterol levels are stable. She is unable to tolerate statins.

## 2023-12-20 NOTE — Assessment & Plan Note (Signed)
 Continue statin, tolerating well

## 2023-12-20 NOTE — Assessment & Plan Note (Signed)
HgbA1c is stable. Continue focusing on healthy diet.

## 2023-12-22 ENCOUNTER — Other Ambulatory Visit: Payer: Self-pay | Admitting: Nurse Practitioner

## 2023-12-22 DIAGNOSIS — E1169 Type 2 diabetes mellitus with other specified complication: Secondary | ICD-10-CM

## 2024-01-19 DIAGNOSIS — M25562 Pain in left knee: Secondary | ICD-10-CM | POA: Diagnosis not present

## 2024-01-21 DIAGNOSIS — E119 Type 2 diabetes mellitus without complications: Secondary | ICD-10-CM | POA: Diagnosis not present

## 2024-01-22 ENCOUNTER — Other Ambulatory Visit: Payer: Self-pay | Admitting: Cardiovascular Disease

## 2024-02-09 ENCOUNTER — Other Ambulatory Visit: Payer: Self-pay | Admitting: Nurse Practitioner

## 2024-02-09 DIAGNOSIS — Z1231 Encounter for screening mammogram for malignant neoplasm of breast: Secondary | ICD-10-CM

## 2024-03-09 ENCOUNTER — Ambulatory Visit (INDEPENDENT_AMBULATORY_CARE_PROVIDER_SITE_OTHER)

## 2024-03-09 VITALS — Ht 63.0 in | Wt 156.0 lb

## 2024-03-09 DIAGNOSIS — M2142 Flat foot [pes planus] (acquired), left foot: Secondary | ICD-10-CM

## 2024-03-09 DIAGNOSIS — M722 Plantar fascial fibromatosis: Secondary | ICD-10-CM | POA: Diagnosis not present

## 2024-03-09 DIAGNOSIS — M2012 Hallux valgus (acquired), left foot: Secondary | ICD-10-CM

## 2024-03-09 DIAGNOSIS — M25579 Pain in unspecified ankle and joints of unspecified foot: Secondary | ICD-10-CM | POA: Diagnosis not present

## 2024-03-09 DIAGNOSIS — M2141 Flat foot [pes planus] (acquired), right foot: Secondary | ICD-10-CM

## 2024-03-09 DIAGNOSIS — M2041 Other hammer toe(s) (acquired), right foot: Secondary | ICD-10-CM | POA: Diagnosis not present

## 2024-03-09 DIAGNOSIS — M2011 Hallux valgus (acquired), right foot: Secondary | ICD-10-CM | POA: Diagnosis not present

## 2024-03-09 DIAGNOSIS — M2042 Other hammer toe(s) (acquired), left foot: Secondary | ICD-10-CM

## 2024-03-09 NOTE — Progress Notes (Signed)
 Subjective:  Patient ID: Terri Barajas, female    DOB: 25-Mar-1946,  MRN: [id number]  Chief Complaint  Patient presents with   Arthritis    Patient is here for joint pain in bilateral toes 1-5    78 y.o. female presents with the above complaint.  She states that she is having pain to both the bottom of her foot as well as to the big toe joints bilaterally.  She is unable to reproduce the pain to her big toe joints but does note that they are beginning to rub on her second toe more and more.  She does have a history of plantar fasciitis.  Patient wearing minimally supportive sketchers today.  Review of Systems: Negative except as noted in the HPI. Denies N/V/F/Ch.  Past Medical History:  Diagnosis Date   Anxiety    Arthritis    Complication of anesthesia    some type of sedation medication made her feel crazy   Dysrhythmia    Family history of adverse reaction to anesthesia    daughter has Malignant Hyperthermia hx -    GERD (gastroesophageal reflux disease)    Glaucoma    H/O: hysterectomy 1978   Heart disease    History of hiatal hernia    Hypertension    MVP (mitral valve prolapse)    Panic attacks    Pre-diabetes    Sciatica     Current Outpatient Medications:    acetaminophen  (TYLENOL ) 500 MG tablet, Take 500-1,000 mg by mouth every 6 (six) hours as needed for moderate pain., Disp: , Rfl:    ALPRAZolam  (XANAX ) 0.25 MG tablet, Take 1 tablet (0.25 mg total) by mouth daily as needed for anxiety., Disp: 30 tablet, Rfl: 0   AMBULATORY NON FORMULARY MEDICATION, Nitroglycerin  0.125% gel to [facility]. You should apply a pea size amount to your rectum twice daily for 2 weeks., Disp: 30 g, Rfl: 0   ascorbic acid (VITAMIN C) 500 MG tablet, Take 500 mg by mouth daily., Disp: , Rfl:    benzonatate  (TESSALON ) 100 MG capsule, Take 1 capsule (100 mg total) by mouth 2 (two) times daily as needed for cough., Disp: 60 capsule, Rfl: 1   Biotin 5 MG CAPS, Take 5 mg by mouth  daily., Disp: , Rfl:    calcium  carbonate (OSCAL) 1500 (600 Ca) MG TABS tablet, Take 600 mg of elemental calcium  by mouth daily., Disp: , Rfl:    cholecalciferol (VITAMIN D3) 25 MCG (1000 UNIT) tablet, Take 3,000 Units by mouth daily., Disp: , Rfl:    Cyanocobalamin  (VITAMIN B 12) 100 MCG LOZG, Take 1 tablet by mouth daily., Disp: , Rfl:    diltiazem  (CARDIZEM ) 30 MG tablet, TAKE 1 TABLET BY MOUTH TWICE  DAILY, Disp: 180 tablet, Rfl: 3   dorzolamide-timolol (COSOPT) 22.3-6.8 MG/ML ophthalmic solution, Apply 1 drop to eye in the morning and at bedtime., Disp: , Rfl:    Evolocumab  (REPATHA  SURECLICK) 140 MG/ML SOAJ, INJECT 140 MG INTO THE SKIN EVERY 14 (FOURTEEN) DAYS., Disp: 6 mL, Rfl: 3   fluticasone  (FLONASE ) 50 MCG/ACT nasal spray, Place 1 spray into both nostrils daily., Disp: 16 g, Rfl: 0   hydrochlorothiazide  (HYDRODIURIL ) 25 MG tablet, TAKE 1 TABLET BY MOUTH DAILY, Disp: 100 tablet, Rfl: 2   hydrocortisone  (ANUSOL -HC) 25 MG suppository, Place 1 suppository (25 mg total) rectally 2 (two) times daily as needed for hemorrhoids or anal itching., Disp: 24 suppository, Rfl: 0   ibuprofen  (ADVIL ) 200 MG tablet, Take 200-400 mg  by mouth every 6 (six) hours as needed for moderate pain., Disp: , Rfl:    latanoprost (XALATAN) 0.005 % ophthalmic solution, Place 1 drop into both eyes at bedtime. , Disp: , Rfl:    loratadine (CLARITIN REDITABS) 10 MG dissolvable tablet, Take 10 mg by mouth daily as needed for allergies., Disp: , Rfl:    meclizine  (ANTIVERT ) 25 MG tablet, Take 1 tablet (25 mg total) by mouth 3 (three) times daily as needed for dizziness., Disp: 30 tablet, Rfl: 0   Multiple Vitamins-Minerals (CENTRUM SILVER 50+WOMEN PO), Take 1 tablet by mouth daily., Disp: , Rfl:    omeprazole (PRILOSEC) 20 MG capsule, Take 20 mg by mouth 2 (two) times daily before a meal., Disp: , Rfl:    polyethylene glycol (MIRALAX  / GLYCOLAX ) 17 g packet, Take 17 g by mouth daily as needed for moderate constipation.,  Disp: 14 each, Rfl: 1   Probiotic Product (ALIGN PO), Take 1 capsule by mouth daily., Disp: , Rfl:    RHOPRESSA 0.02 % SOLN, Place 1 drop into both eyes every evening., Disp: , Rfl:    vitamin E 180 MG (400 UNITS) capsule, Take 400 Units by mouth daily., Disp: , Rfl:   Social History   Tobacco Use  Smoking Status Never   Passive exposure: Never  Smokeless Tobacco Never    Allergies  Allergen Reactions   Shellfish Allergy Anaphylaxis and Shortness Of Breath   Atorvastatin      Myalgias    Atracurium & Derivatives Other (See Comments)    Unknown   Atropine Other (See Comments)    Heart racing   Iodinated Contrast Media    Iodine Other (See Comments)    Unknown   Statins     didn't feel right    Objective:  There were no vitals filed for this visit. Body mass index is 27.63 kg/m. Constitutional Well developed. Well nourished. Oriented to person, place, and time.  Vascular Dorsalis pedis pulses palpable bilaterally. Posterior tibial pulses palpable bilaterally. Capillary refill normal to all digits.  No cyanosis or clubbing noted. Pedal hair growth normal.  Neurologic Normal speech. Epicritic sensation to light touch grossly present bilaterally. Negative tinel sign at tarsal tunnel bilaterally.   Dermatologic Skin texture and turgor are within normal limits.  No open wounds. No skin lesions.  Musculoskeletal: 5 out of 5 muscle strength for all major pedal muscle groups.  Bilateral flexible pes planus foot shape with moderate collapse of medial longitudinal arch.  Subtalar joint and TN range of motion nonpainful.  Hallux abductovalgus deformity present without frank hypermobility of the first tarsometatarsal bilaterally.  No pain on range of motion, minimal pain to palpation of the medial first MTP bilaterally.  Pain to palpation of the medial calcaneal tuber bilaterally. Gastrocnemius equinus with ankle dorsiflexion limited with knee extended, normal with knee  flexed. Hammertoes 2 through 5 with dorsiflexion of the metatarsophalangeal joint bilaterally   Radiographs: Taken and reviewed.  3 weightbearing views of bilateral feet were taken today and reviewed.  These do show a severe pes planus foot deformity with decreased calcaneal inclination and increased talar declination.  No significant arthrosis is identified in the hindfoot bilaterally.  The right hallux interphalangeal joint does have arthritis without pain.  Increased IM 1-2 angle bilaterally. Hammering of digits noted. No fractures or other acute osseous pathology identified.  Assessment:   1. Pes planus of both feet   2. Pain of toe joint on movement   3. Plantar fasciitis, bilateral  4. Hallux abducto valgus, bilateral   5. Hammer toes of both feet    Plan:  - Patient was evaluated and treated and all questions answered.   1. Plantar fasciitis, bilateral 2. Pes planus of both feet - I discussed with the patient her diagnosis of plantar fasciitis, pes planus.  I did discuss the indication for better shoe support with an insole.  This would decrease the strain on the plantar fascia, posterior tibial tendon and likely improve some of her flatfoot pain.  She does have a tight gastrosoleus complex, recommended stretching to improve this area. - We discussed an over-the-counter insole to help keep her subtalar joint in neutral, foot rectus and decrease strain across both the posterior tibial tendon and the plantar fascia.  Today, she was dispensed and fitted for power steps.  - She plans to go to [employer] for new shoes  3. Hallux abducto valgus, bilateral 4. Hammertoes 2-5, bilateral - We discussed the diagnosis of hallux abductovalgus along with hammertoes bilaterally.  Through exam, I was unable to reproduce any of the pain that she relates to.  I did discuss with her that there are bunion corrective procedures that could be done such as first metatarsal osteotomy or first MTP  arthrodesis.  However, I would feel more confident in a successful procedure if we were able to reproduce pain clinically.  I discussed with her that if the rubbing on the second toe becomes an issue or if she does begin to develop joint pain/reproducible medial eminence pain, we can consider surgical intervention.  She expresses understanding of this. - She will attempt conservative therapy with wide toe box shoes - RTC as needed for potential surgical workup    Prentice Ovens, DPM AACFAS Fellowship Trained Podiatric Surgeon [facility]

## 2024-03-25 ENCOUNTER — Ambulatory Visit
Admission: RE | Admit: 2024-03-25 | Discharge: 2024-03-25 | Disposition: A | Discharge status: Home / Self Care | Source: Ambulatory Visit | Attending: Nurse Practitioner | Admitting: Nurse Practitioner

## 2024-03-25 DIAGNOSIS — Z1231 Encounter for screening mammogram for malignant neoplasm of breast: Secondary | ICD-10-CM

## 2024-04-07 ENCOUNTER — Encounter: Payer: Self-pay | Admitting: Nurse Practitioner

## 2024-04-14 ENCOUNTER — Ambulatory Visit: Payer: Self-pay | Admitting: Family Medicine

## 2024-04-14 NOTE — Progress Notes (Signed)
 Terri Barajas                                          MRN: [id number]   04/14/2024   The [facility] Quality Team Specialist reviewed this patient medical record for the purposes of chart review for care gap closure. The following were reviewed: chart review for care gap closure-kidney health evaluation for diabetes:eGFR  and uACR.    [facility] Quality Team

## 2024-04-15 ENCOUNTER — Encounter: Payer: Self-pay | Admitting: Family Medicine

## 2024-04-15 ENCOUNTER — Ambulatory Visit: Payer: Self-pay | Admitting: Family Medicine

## 2024-04-15 VITALS — BP 114/60 | HR 85 | Temp 98.3°F | Ht 63.0 in | Wt 156.0 lb

## 2024-04-15 DIAGNOSIS — M79605 Pain in left leg: Secondary | ICD-10-CM

## 2024-04-15 DIAGNOSIS — M1712 Unilateral primary osteoarthritis, left knee: Secondary | ICD-10-CM

## 2024-04-15 DIAGNOSIS — M79604 Pain in right leg: Secondary | ICD-10-CM | POA: Diagnosis not present

## 2024-04-15 MED ORDER — MELOXICAM 7.5 MG PO TABS: 7.5000 mg | ORAL_TABLET | Freq: Two times a day (BID) | ORAL | 1 refills | Status: DC | PRN

## 2024-04-15 NOTE — Progress Notes (Signed)
 I,Jameka J Llittleton, CMA,acting as a neurosurgeon for [employer], NP.,have documented all relevant documentation on the behalf of Bruna Creighton, NP,as directed by  Bruna Creighton, NP while in the presence of Bruna Creighton, NP.  Subjective:  Patient ID: Terri Barajas , female    DOB: 1945/07/11 , 78 y.o.   MRN: [id number]  Chief Complaint  Patient presents with   Leg Pain    Patient presents today for leg pain in both her legs. Patient reports the pain in her legs has been ongoing for the past couple of years. She reports the pain has worsened in her legs over the past couple of months. Patient would like to have a referral to the vein specialist. Patient has tried tylenol  and voltaren  gel but hasn't seen any improvements.    HPI Discussed the use of AI scribe software for clinical note transcription with the patient, who gave verbal consent to proceed.  History of Present Illness      Terri Barajas is a 78 year old female who presents with pain in her lower extremities.  She experiences pain in both legs, particularly in the veins, with the left leg being more affected due to a previous knee issue. The pain is exacerbated by walking and standing, and she sometimes cannot put her knees together due to sensitivity. Support hose provide some relief, but the pain persists when they are removed. She has received gel and cortisone shots in the past but was told she did not need a knee replacement.  She has a history of a torn meniscus in her left knee, for which she had surgery years ago, but it did not alleviate the swelling and pain. The knee swells frequently and hurts constantly, regardless of activity.  She experiences stiffness and aching in her hands upon waking, and her knees and shoulders crack. She occasionally takes Tylenol  and ibuprofen  for pain but is cautious about regular use due to concerns about kidney health. She reports seldom experiencing cramps in her legs and feet, with more  frequent aching.  She recalls having severe COVID-19 when it first emerged, which she feels has impacted her health since then. No back issues reported.      Past Medical History:  Diagnosis Date   Anxiety    Arthritis    Complication of anesthesia    some type of sedation medication made her feel crazy   Dysrhythmia    Family history of adverse reaction to anesthesia    daughter has Malignant Hyperthermia hx -    GERD (gastroesophageal reflux disease)    Glaucoma    H/O: hysterectomy 1978   Heart disease    History of hiatal hernia    Hypertension    MVP (mitral valve prolapse)    Panic attacks    Pre-diabetes    Sciatica      Family History  Problem Relation Age of Onset   Pancreatic cancer Mother    Alcohol abuse Father    Asthma Sister    Hypertension Sister    Arthritis Sister    Arthritis Sister    Rheum arthritis Sister    Diabetes Brother    Arthritis Brother    Hyperlipidemia Brother    Hypertension Brother    Glaucoma Brother    Colon polyps Brother    Prostate cancer Brother    Congenital heart disease Brother    Throat cancer Brother    Alcohol abuse Brother    Healthy Daughter  Healthy Son    Colon cancer Neg Hx    Esophageal cancer Neg Hx    Inflammatory bowel disease Neg Hx    Liver disease Neg Hx    Rectal cancer Neg Hx    Stomach cancer Neg Hx      Current Outpatient Medications:    acetaminophen  (TYLENOL ) 500 MG tablet, Take 500-1,000 mg by mouth every 6 (six) hours as needed for moderate pain., Disp: , Rfl:    ALPRAZolam  (XANAX ) 0.25 MG tablet, Take 1 tablet (0.25 mg total) by mouth daily as needed for anxiety., Disp: 30 tablet, Rfl: 0   AMBULATORY NON FORMULARY MEDICATION, Nitroglycerin  0.125% gel to [facility]. You should apply a pea size amount to your rectum twice daily for 2 weeks., Disp: 30 g, Rfl: 0   ascorbic acid (VITAMIN C) 500 MG tablet, Take 500 mg by mouth daily., Disp: , Rfl:    benzonatate  (TESSALON ) 100 MG  capsule, Take 1 capsule (100 mg total) by mouth 2 (two) times daily as needed for cough., Disp: 60 capsule, Rfl: 1   Biotin 5 MG CAPS, Take 5 mg by mouth daily., Disp: , Rfl:    calcium  carbonate (OSCAL) 1500 (600 Ca) MG TABS tablet, Take 600 mg of elemental calcium  by mouth daily., Disp: , Rfl:    cholecalciferol (VITAMIN D3) 25 MCG (1000 UNIT) tablet, Take 3,000 Units by mouth daily., Disp: , Rfl:    Cyanocobalamin  (VITAMIN B 12) 100 MCG LOZG, Take 1 tablet by mouth daily., Disp: , Rfl:    diltiazem  (CARDIZEM ) 30 MG tablet, TAKE 1 TABLET BY MOUTH TWICE  DAILY, Disp: 180 tablet, Rfl: 3   dorzolamide-timolol (COSOPT) 22.3-6.8 MG/ML ophthalmic solution, Apply 1 drop to eye in the morning and at bedtime., Disp: , Rfl:    Evolocumab  (REPATHA  SURECLICK) 140 MG/ML SOAJ, INJECT 140 MG INTO THE SKIN EVERY 14 (FOURTEEN) DAYS., Disp: 6 mL, Rfl: 3   fluticasone  (FLONASE ) 50 MCG/ACT nasal spray, Place 1 spray into both nostrils daily., Disp: 16 g, Rfl: 0   hydrochlorothiazide  (HYDRODIURIL ) 25 MG tablet, TAKE 1 TABLET BY MOUTH DAILY, Disp: 100 tablet, Rfl: 2   hydrocortisone  (ANUSOL -HC) 25 MG suppository, Place 1 suppository (25 mg total) rectally 2 (two) times daily as needed for hemorrhoids or anal itching., Disp: 24 suppository, Rfl: 0   latanoprost (XALATAN) 0.005 % ophthalmic solution, Place 1 drop into both eyes at bedtime. , Disp: , Rfl:    loratadine (CLARITIN REDITABS) 10 MG dissolvable tablet, Take 10 mg by mouth daily as needed for allergies., Disp: , Rfl:    meclizine  (ANTIVERT ) 25 MG tablet, Take 1 tablet (25 mg total) by mouth 3 (three) times daily as needed for dizziness., Disp: 30 tablet, Rfl: 0   meloxicam  (MOBIC ) 7.5 MG tablet, Take 1 tablet (7.5 mg total) by mouth 2 (two) times daily as needed for pain. ALWAYS TAKE WITH FOOD, Disp: 60 tablet, Rfl: 1   Multiple Vitamins-Minerals (CENTRUM SILVER 50+WOMEN PO), Take 1 tablet by mouth daily., Disp: , Rfl:    omeprazole (PRILOSEC) 20 MG capsule,  Take 20 mg by mouth 2 (two) times daily before a meal., Disp: , Rfl:    polyethylene glycol (MIRALAX  / GLYCOLAX ) 17 g packet, Take 17 g by mouth daily as needed for moderate constipation., Disp: 14 each, Rfl: 1   Probiotic Product (ALIGN PO), Take 1 capsule by mouth daily., Disp: , Rfl:    RHOPRESSA 0.02 % SOLN, Place 1 drop into both eyes every evening.,  Disp: , Rfl:    vitamin E 180 MG (400 UNITS) capsule, Take 400 Units by mouth daily., Disp: , Rfl:    Allergies  Allergen Reactions   Shellfish Allergy Anaphylaxis and Shortness Of Breath   Atorvastatin      Myalgias    Atracurium & Derivatives Other (See Comments)    Unknown   Atropine Other (See Comments)    Heart racing   Iodinated Contrast Media    Iodine Other (See Comments)    Unknown   Statins     didn't feel right      Review of Systems  Constitutional: Negative.   Respiratory: Negative.    Cardiovascular: Negative.   Musculoskeletal:  Positive for arthralgias and myalgias.  Psychiatric/Behavioral: Negative.       Today's Vitals   04/15/24 0842  BP: 114/60  Pulse: 85  Temp: 98.3 F (36.8 C)  TempSrc: Oral  Weight: 156 lb (70.8 kg)  Height: 5' 3 (1.6 m)  PainSc: 2   PainLoc: Leg   Body mass index is 27.63 kg/m.  Wt Readings from Last 3 Encounters:  04/22/24 157 lb 12.8 oz (71.6 kg)  04/15/24 156 lb (70.8 kg)  03/09/24 156 lb (70.8 kg)    The 10-year ASCVD risk score (Arnett DK, et al., 2019) is: 29.1%   Values used to calculate the score:     Age: 30 years     Clincally relevant sex: Female     Is Non-Hispanic African American: Yes     Diabetic: Yes     Tobacco smoker: No     Systolic Blood Pressure: 126 mmHg     Is BP treated: Yes     HDL Cholesterol: 52 mg/dL     Total Cholesterol: 171 mg/dL  Objective:  Physical Exam HENT:     Head: Normocephalic.  Pulmonary:     Effort: Pulmonary effort is normal.     Breath sounds: Normal breath sounds.  Musculoskeletal:        General: Tenderness  present.  Skin:    General: Skin is warm and dry.  Neurological:     Mental Status: She is alert and oriented to person, place, and time. Mental status is at baseline.  Psychiatric:        Mood and Affect: Mood normal.        Behavior: Behavior normal.         Assessment And Plan:  Pain in both lower extremities Assessment & Plan: Chronic pain in both lower extremities, possibly due to venous insufficiency. No signs of clots. Some relief with support hose. - Refer to vein specialist for evaluation of blood flow and potential venous insufficiency. - Advise use of support hose to aid circulation.   Orders: -     Ambulatory referral to Vascular Surgery  Unilateral primary osteoarthritis, left knee Assessment & Plan: Chronic left knee pain with history of torn meniscus surgery and injections. Persistent symptoms. No current indication for knee replacement. Prefers minimal medication use. - Prescribe meloxicam  7.5 mg oral twice daily as needed for pain, with instructions to take with food. - Advise against taking meloxicam  with ibuprofen .   Orders: -     Meloxicam ; Take 1 tablet (7.5 mg total) by mouth 2 (two) times daily as needed for pain. ALWAYS TAKE WITH FOOD  Dispense: 60 tablet; Refill: 1    Assessment & Plan Chronic bilateral lower extremity pain Left knee osteoarthritis . - Prescribe meloxicam  7.5 mg oral twice daily as needed for  pain, with instructions to take with food. - Advise against taking meloxicam  with ibuprofen .   Return if symptoms worsen or fail to improve, for Keep next appt.  Patient was given opportunity to ask questions. Patient verbalized understanding of the plan and was able to repeat key elements of the plan. All questions were answered to their satisfaction.    I, Bruna Creighton, NP, have reviewed all documentation for this visit. The documentation on 04/22/2024 for the exam, diagnosis, procedures, and orders are all accurate and complete.   IF YOU  HAVE BEEN REFERRED TO A SPECIALIST, IT MAY TAKE 1-2 WEEKS TO SCHEDULE/PROCESS THE REFERRAL. IF YOU HAVE NOT HEARD FROM US /SPECIALIST IN TWO WEEKS, PLEASE GIVE US  A CALL AT [phone].

## 2024-04-22 ENCOUNTER — Encounter: Payer: Self-pay | Admitting: Nurse Practitioner

## 2024-04-22 ENCOUNTER — Ambulatory Visit (INDEPENDENT_AMBULATORY_CARE_PROVIDER_SITE_OTHER): Admitting: Nurse Practitioner

## 2024-04-22 VITALS — BP 126/74 | HR 85 | Temp 98.9°F | Ht 63.0 in | Wt 157.8 lb

## 2024-04-22 DIAGNOSIS — I700 Atherosclerosis of aorta: Secondary | ICD-10-CM

## 2024-04-22 DIAGNOSIS — E1169 Type 2 diabetes mellitus with other specified complication: Secondary | ICD-10-CM

## 2024-04-22 DIAGNOSIS — M79604 Pain in right leg: Secondary | ICD-10-CM

## 2024-04-22 DIAGNOSIS — E663 Overweight: Secondary | ICD-10-CM

## 2024-04-22 DIAGNOSIS — Z0000 Encounter for general adult medical examination without abnormal findings: Secondary | ICD-10-CM | POA: Diagnosis not present

## 2024-04-22 DIAGNOSIS — I119 Hypertensive heart disease without heart failure: Secondary | ICD-10-CM | POA: Diagnosis not present

## 2024-04-22 DIAGNOSIS — E781 Pure hyperglyceridemia: Secondary | ICD-10-CM

## 2024-04-22 DIAGNOSIS — Z79899 Other long term (current) drug therapy: Secondary | ICD-10-CM

## 2024-04-22 DIAGNOSIS — G720 Drug-induced myopathy: Secondary | ICD-10-CM

## 2024-04-22 DIAGNOSIS — T466X5A Adverse effect of antihyperlipidemic and antiarteriosclerotic drugs, initial encounter: Secondary | ICD-10-CM

## 2024-04-22 DIAGNOSIS — Z6827 Body mass index (BMI) 27.0-27.9, adult: Secondary | ICD-10-CM

## 2024-04-22 DIAGNOSIS — I10 Essential (primary) hypertension: Secondary | ICD-10-CM

## 2024-04-22 DIAGNOSIS — M79605 Pain in left leg: Secondary | ICD-10-CM

## 2024-04-22 DIAGNOSIS — G4452 New daily persistent headache (NDPH): Secondary | ICD-10-CM | POA: Insufficient documentation

## 2024-04-22 DIAGNOSIS — G4733 Obstructive sleep apnea (adult) (pediatric): Secondary | ICD-10-CM

## 2024-04-22 DIAGNOSIS — K5904 Chronic idiopathic constipation: Secondary | ICD-10-CM

## 2024-04-22 DIAGNOSIS — E782 Mixed hyperlipidemia: Secondary | ICD-10-CM

## 2024-04-22 LAB — POCT URINALYSIS DIP (CLINITEK)
Bilirubin, UA: NEGATIVE
Blood, UA: NEGATIVE
Glucose, UA: NEGATIVE mg/dL
Ketones, POC UA: NEGATIVE mg/dL
Leukocytes, UA: NEGATIVE
Nitrite, UA: NEGATIVE
POC PROTEIN,UA: NEGATIVE
Spec Grav, UA: 1.02 (ref 1.010–1.025)
Urobilinogen, UA: 1 U/dL
pH, UA: 6 (ref 5.0–8.0)

## 2024-04-22 NOTE — Assessment & Plan Note (Signed)
 Persistent constipation despite Miralax  and fiber. Discussed further evaluation and treatment options. - Continue Miralax  and fiber supplementation. - Encouraged adequate hydration. - Advised follow-up with GI specialist to discuss potential need for Linzess or Amitiza.

## 2024-04-22 NOTE — Assessment & Plan Note (Addendum)
 Blood pressure is well controlled, continue current medications.  EKG done with NSR

## 2024-04-22 NOTE — Assessment & Plan Note (Signed)
 Discussed diet, exercise, and weight management. Emphasized protein and fiber intake. - Continue exercise routine three times a week. - Encouraged dietary modifications to include more protein and fiber. - Reassured regarding minor weight fluctuations.

## 2024-04-22 NOTE — Assessment & Plan Note (Signed)
 Currently on Repatha.

## 2024-04-22 NOTE — Assessment & Plan Note (Signed)
 She has seen ENT due to thinking related to nasal congestion. Per ENT suspected migraines based on their evaluation. Awaiting imaging studies. - Await scheduling of imaging studies as recommended by ENT.

## 2024-04-22 NOTE — Assessment & Plan Note (Signed)
 A1c at 6.5%. This is a new diagnosis. Discussed risks of uncontrolled diabetes and dietary modifications. Reviewed potential treatments if A1c remains elevated. - Ordered A1c test to assess current diabetes control. - Discussed potential treatment options if A1c remains elevated, including daily medication or weekly injection.

## 2024-04-22 NOTE — Progress Notes (Signed)
 Terri Barajas, CMA,acting as a neurosurgeon for Terri Ada, FNP.,have documented all relevant documentation on the behalf of Terri Ada, FNP,as directed by  Terri Ada, FNP while in the presence of Terri Ada, FNP.  Subjective:    Patient ID: Terri Barajas , female    DOB: [birth year] , 78 y.o.   MRN: [id number]  Chief Complaint  Patient presents with   Annual Exam    Patient presents today for HM, Patient reports compliance with medication. Patient denies any chest pain, SOB, or headaches. Patient has no concerns today.      HPI  Discussed the use of AI scribe software for clinical note transcription with the patient, who gave verbal consent to proceed.  History of Present Illness Terri Barajas is a 78 year old female who presents for an annual physical exam.  She experiences headaches and pain on the side of her head. An ear, nose, and throat specialist was seen for headaches and pain on the side of her head; the patient recalls being told it may be migraines and that further imaging studies may be scheduled.  She has concerns about her lower extremities and has an upcoming appointment with a vein and vascular specialist. She was prescribed meloxicam  for leg pain but is apprehensive about potential side effects, particularly hair loss.  She experiences chronic constipation and reports a history of bowel backup. She takes Miralax  and Benefiber daily but continues to feel bloated. She follows up with a gastroenterologist but has not been prescribed additional treatments like Linzess or Amitiza.  She uses a CPAP machine for sleep apnea but is unsure about the last time it was checked. She feels constantly tired and attributes it to her diet, which she describes as 'so-so', with a tendency to eat sweets and carbohydrates, especially at night.  She has a history of elevated A1c levels, previously recorded at 6.5, indicating diabetes. She has reduced her intake of sweets and is  currently on Repatha  injections biweekly for cholesterol management.  She reports a weight fluctuation of one pound between morning and afternoon, which she does not find concerning. No recent issues with iron levels but was advised to take vitamin B12.  Past Medical History:  Diagnosis Date   Anxiety    Arthritis    Complication of anesthesia    some type of sedation medication made her feel crazy   Dysrhythmia    Family history of adverse reaction to anesthesia    daughter has Malignant Hyperthermia hx -    GERD (gastroesophageal reflux disease)    Glaucoma    H/O: hysterectomy 1978   Heart disease    History of hiatal hernia    Hypertension    MVP (mitral valve prolapse)    Panic attacks    Pre-diabetes    Sciatica      Family History  Problem Relation Age of Onset   Pancreatic cancer Mother    Alcohol abuse Father    Asthma Sister    Hypertension Sister    Arthritis Sister    Arthritis Sister    Rheum arthritis Sister    Diabetes Brother    Arthritis Brother    Hyperlipidemia Brother    Hypertension Brother    Glaucoma Brother    Colon polyps Brother    Prostate cancer Brother    Congenital heart disease Brother    Throat cancer Brother    Alcohol abuse Brother    Healthy Daughter    Healthy Son  Colon cancer Neg Hx    Esophageal cancer Neg Hx    Inflammatory bowel disease Neg Hx    Liver disease Neg Hx    Rectal cancer Neg Hx    Stomach cancer Neg Hx      Current Outpatient Medications:    acetaminophen  (TYLENOL ) 500 MG tablet, Take 500-1,000 mg by mouth every 6 (six) hours as needed for moderate pain., Disp: , Rfl:    ALPRAZolam  (XANAX ) 0.25 MG tablet, Take 1 tablet (0.25 mg total) by mouth daily as needed for anxiety., Disp: 30 tablet, Rfl: 0   AMBULATORY NON FORMULARY MEDICATION, Nitroglycerin  0.125% gel to [facility]. You should apply a pea size amount to your rectum twice daily for 2 weeks., Disp: 30 g, Rfl: 0   ascorbic acid (VITAMIN C)  500 MG tablet, Take 500 mg by mouth daily., Disp: , Rfl:    benzonatate  (TESSALON ) 100 MG capsule, Take 1 capsule (100 mg total) by mouth 2 (two) times daily as needed for cough., Disp: 60 capsule, Rfl: 1   Biotin 5 MG CAPS, Take 5 mg by mouth daily., Disp: , Rfl:    calcium  carbonate (OSCAL) 1500 (600 Ca) MG TABS tablet, Take 600 mg of elemental calcium  by mouth daily., Disp: , Rfl:    cholecalciferol (VITAMIN D3) 25 MCG (1000 UNIT) tablet, Take 3,000 Units by mouth daily., Disp: , Rfl:    Cyanocobalamin  (VITAMIN B 12) 100 MCG LOZG, Take 1 tablet by mouth daily., Disp: , Rfl:    diltiazem  (CARDIZEM ) 30 MG tablet, TAKE 1 TABLET BY MOUTH TWICE  DAILY, Disp: 180 tablet, Rfl: 3   dorzolamide-timolol (COSOPT) 22.3-6.8 MG/ML ophthalmic solution, Apply 1 drop to eye in the morning and at bedtime., Disp: , Rfl:    Evolocumab  (REPATHA  SURECLICK) 140 MG/ML SOAJ, INJECT 140 MG INTO THE SKIN EVERY 14 (FOURTEEN) DAYS., Disp: 6 mL, Rfl: 3   fluticasone  (FLONASE ) 50 MCG/ACT nasal spray, Place 1 spray into both nostrils daily., Disp: 16 g, Rfl: 0   hydrochlorothiazide  (HYDRODIURIL ) 25 MG tablet, TAKE 1 TABLET BY MOUTH DAILY, Disp: 100 tablet, Rfl: 2   hydrocortisone  (ANUSOL -HC) 25 MG suppository, Place 1 suppository (25 mg total) rectally 2 (two) times daily as needed for hemorrhoids or anal itching., Disp: 24 suppository, Rfl: 0   latanoprost (XALATAN) 0.005 % ophthalmic solution, Place 1 drop into both eyes at bedtime. , Disp: , Rfl:    loratadine (CLARITIN REDITABS) 10 MG dissolvable tablet, Take 10 mg by mouth daily as needed for allergies., Disp: , Rfl:    meclizine  (ANTIVERT ) 25 MG tablet, Take 1 tablet (25 mg total) by mouth 3 (three) times daily as needed for dizziness., Disp: 30 tablet, Rfl: 0   meloxicam  (MOBIC ) 7.5 MG tablet, Take 1 tablet (7.5 mg total) by mouth 2 (two) times daily as needed for pain. ALWAYS TAKE WITH FOOD, Disp: 60 tablet, Rfl: 1   Multiple Vitamins-Minerals (CENTRUM SILVER 50+WOMEN  PO), Take 1 tablet by mouth daily., Disp: , Rfl:    omeprazole (PRILOSEC) 20 MG capsule, Take 20 mg by mouth 2 (two) times daily before a meal., Disp: , Rfl:    polyethylene glycol (MIRALAX  / GLYCOLAX ) 17 g packet, Take 17 g by mouth daily as needed for moderate constipation., Disp: 14 each, Rfl: 1   Probiotic Product (ALIGN PO), Take 1 capsule by mouth daily., Disp: , Rfl:    RHOPRESSA 0.02 % SOLN, Place 1 drop into both eyes every evening., Disp: , Rfl:  vitamin E 180 MG (400 UNITS) capsule, Take 400 Units by mouth daily., Disp: , Rfl:    Allergies  Allergen Reactions   Shellfish Allergy Anaphylaxis and Shortness Of Breath   Atorvastatin      Myalgias    Atracurium & Derivatives Other (See Comments)    Unknown   Atropine Other (See Comments)    Heart racing   Iodinated Contrast Media    Iodine Other (See Comments)    Unknown   Statins     didn't feel right       The patient states she uses status post hysterectomy for birth control. No LMP recorded (lmp unknown). Patient has had a hysterectomy.. Negative for: breast discharge, breast lump(s), breast pain and breast self exam. Associated symptoms include abnormal vaginal bleeding. Pertinent negatives include abnormal bleeding (hematology), anxiety, decreased libido, depression, difficulty falling sleep, dyspareunia, history of infertility, nocturia, sexual dysfunction, sleep disturbances, urinary incontinence, urinary urgency, vaginal discharge and vaginal itching. Diet regular; she does admit to eating sweets and eating late at night. The patient states her exercise level is minimal 1-2 days a week.   [name] tobacco use is:  Social History   Tobacco Use  Smoking Status Never   Passive exposure: Never  Smokeless Tobacco Never   She has been exposed to passive smoke. [name] alcohol use is:  Social History   Substance and Sexual Activity  Alcohol Use Never    Review of Systems  Constitutional:  Positive for  fatigue.  HENT: Negative.    Eyes: Negative.   Respiratory: Negative.    Cardiovascular: Negative.   Gastrointestinal: Negative.   Endocrine: Negative.   Genitourinary: Negative.   Musculoskeletal: Negative.   Skin: Negative.   Allergic/Immunologic: Negative.   Neurological: Negative.   Hematological: Negative.   Psychiatric/Behavioral: Negative.       Today's Vitals   04/22/24 1417  BP: 126/74  Pulse: 85  Temp: 98.9 F (37.2 C)  TempSrc: Oral  Weight: 157 lb 12.8 oz (71.6 kg)  Height: 5' 3 (1.6 m)  PainSc: 8   PainLoc: Leg   Body mass index is 27.95 kg/m.  Wt Readings from Last 3 Encounters:  04/22/24 157 lb 12.8 oz (71.6 kg)  04/15/24 156 lb (70.8 kg)  03/09/24 156 lb (70.8 kg)     Objective:  Physical Exam Vitals and nursing note reviewed.  Constitutional:      General: She is not in acute distress.    Appearance: Normal appearance. She is well-developed. She is obese.  HENT:     Head: Normocephalic and atraumatic.     Right Ear: Hearing, tympanic membrane, ear canal and external ear normal. There is no impacted cerumen.     Left Ear: Hearing, tympanic membrane, ear canal and external ear normal. There is no impacted cerumen.     Nose: Nose normal.     Mouth/Throat:     Mouth: Mucous membranes are moist.  Eyes:     General: Lids are normal.     Extraocular Movements: Extraocular movements intact.     Conjunctiva/sclera: Conjunctivae normal.     Pupils: Pupils are equal, round, and reactive to light.     Funduscopic exam:    Right eye: No papilledema.        Left eye: No papilledema.  Neck:     Thyroid : No thyroid  mass.     Vascular: No carotid bruit.  Cardiovascular:     Rate and Rhythm: Normal rate and regular rhythm.  Pulses: Normal pulses.     Heart sounds: Normal heart sounds. No murmur heard. Pulmonary:     Effort: Pulmonary effort is normal.     Breath sounds: Normal breath sounds.  Chest:     Chest wall: No mass.  Breasts:    Tanner  Score is 5.     Right: Normal. No mass or tenderness.     Left: Normal. No mass or tenderness.  Abdominal:     General: Abdomen is flat. Bowel sounds are normal. There is no distension.     Palpations: Abdomen is soft.     Tenderness: There is no abdominal tenderness.  Genitourinary:    Rectum: Guaiac result negative.  Musculoskeletal:        General: No swelling. Normal range of motion.     Cervical back: Full passive range of motion without pain, normal range of motion and neck supple.     Right lower leg: No edema.     Left lower leg: No edema.  Lymphadenopathy:     Upper Body:     Right upper body: No supraclavicular, axillary or pectoral adenopathy.     Left upper body: No supraclavicular, axillary or pectoral adenopathy.  Skin:    General: Skin is warm and dry.     Capillary Refill: Capillary refill takes less than 2 seconds.  Neurological:     General: No focal deficit present.     Mental Status: She is alert and oriented to person, place, and time.     Cranial Nerves: No cranial nerve deficit.     Sensory: No sensory deficit.  Psychiatric:        Mood and Affect: Mood normal.        Behavior: Behavior normal.        Thought Content: Thought content normal.        Judgment: Judgment normal.    Assessment And Plan:     Encounter for annual health examination Assessment & Plan: Discussed diet, exercise, and weight management. Emphasized protein and fiber intake. - Continue exercise routine three times a week. - Encouraged dietary modifications to include more protein and fiber. - Reassured regarding minor weight fluctuations.   Essential hypertension Assessment & Plan: Blood pressure is well controlled, continue current medications.  EKG done with NSR  Orders: -     EKG 12-Lead -     POCT URINALYSIS DIP (CLINITEK) -     Microalbumin / creatinine urine ratio -     CMP14+EGFR  Type 2 diabetes mellitus with hypertriglyceridemia (HCC) Assessment & Plan: A1c at  6.5%. This is a new diagnosis. Discussed risks of uncontrolled diabetes and dietary modifications. Reviewed potential treatments if A1c remains elevated. - Ordered A1c test to assess current diabetes control. - Discussed potential treatment options if A1c remains elevated, including daily medication or weekly injection.  Orders: -     Hemoglobin A1c  Mixed hyperlipidemia Assessment & Plan: Cholesterol levels are stable with triglycerides increased. She is unable to tolerate statins. She is now taking Repatha   Orders: -     Lipid panel  Overweight with body mass index (BMI) of 27 to 27.9 in adult  Other long term (current) drug therapy -     CBC with Differential/Platelet  Chronic idiopathic constipation Assessment & Plan: Persistent constipation despite Miralax  and fiber. Discussed further evaluation and treatment options. - Continue Miralax  and fiber supplementation. - Encouraged adequate hydration. - Advised follow-up with GI specialist to discuss potential need for Linzess or  Amitiza.   Statin myopathy Assessment & Plan: Currently on Repatha    Aortic atherosclerosis Assessment & Plan: Currently on Repatha    Hypertensive heart disease without heart failure Assessment & Plan: Blood pressure is well controlled, continue current medications.  EKG done with NSR   OSA on CPAP Assessment & Plan: Continue f/u with Pulmonology   New daily persistent headache Assessment & Plan: She has seen ENT due to thinking related to nasal congestion. Per ENT suspected migraines based on their evaluation. Awaiting imaging studies. - Await scheduling of imaging studies as recommended by ENT.   Pain in both lower extremities Assessment & Plan: Concerns about meloxicam  side effects. Reassured regarding long-term use side effects. Discussed trial of meloxicam  as needed. - Continue meloxicam  as needed for pain management.     Return for 1 year physical, 6 month bp check. Patient  was given opportunity to ask questions. Patient verbalized understanding of the plan and was able to repeat key elements of the plan. All questions were answered to their satisfaction.   Terri Ada, FNP  I, Terri Ada, FNP, have reviewed all documentation for this visit. The documentation on 04/22/24 for the exam, diagnosis, procedures, and orders are all accurate and complete.

## 2024-04-22 NOTE — Assessment & Plan Note (Signed)
 Concerns about meloxicam  side effects. Reassured regarding long-term use side effects. Discussed trial of meloxicam  as needed. - Continue meloxicam  as needed for pain management.

## 2024-04-22 NOTE — Assessment & Plan Note (Signed)
 Cholesterol levels are stable with triglycerides increased. She is unable to tolerate statins. She is now taking Repatha 

## 2024-04-22 NOTE — Assessment & Plan Note (Signed)
Continue f/u with Pulmonology.  

## 2024-04-23 LAB — CBC WITH DIFFERENTIAL/PLATELET
Basophils Absolute: 0 x10E3/uL (ref 0.0–0.2)
Basos: 1 %
EOS (ABSOLUTE): 0.2 x10E3/uL (ref 0.0–0.4)
Eos: 3 %
Hematocrit: 40.4 % (ref 34.0–46.6)
Hemoglobin: 13.3 g/dL (ref 11.1–15.9)
Immature Grans (Abs): 0 x10E3/uL (ref 0.0–0.1)
Immature Granulocytes: 0 %
Lymphocytes Absolute: 2.3 x10E3/uL (ref 0.7–3.1)
Lymphs: 30 %
MCH: 30.2 pg (ref 26.6–33.0)
MCHC: 32.9 g/dL (ref 31.5–35.7)
MCV: 92 fL (ref 79–97)
Monocytes Absolute: 0.6 x10E3/uL (ref 0.1–0.9)
Monocytes: 8 %
Neutrophils Absolute: 4.4 x10E3/uL (ref 1.4–7.0)
Neutrophils: 58 %
Platelets: 257 x10E3/uL (ref 150–450)
RBC: 4.4 x10E6/uL (ref 3.77–5.28)
RDW: 12.5 % (ref 11.7–15.4)
WBC: 7.5 x10E3/uL (ref 3.4–10.8)

## 2024-04-23 LAB — CMP14+EGFR
ALT: 15 IU/L (ref 0–32)
AST: 16 IU/L (ref 0–40)
Albumin: 4.2 g/dL (ref 3.8–4.8)
Alkaline Phosphatase: 94 IU/L (ref 49–135)
BUN/Creatinine Ratio: 24 (ref 12–28)
BUN: 20 mg/dL (ref 8–27)
Bilirubin Total: 0.3 mg/dL (ref 0.0–1.2)
CO2: 30 mmol/L — ABNORMAL HIGH (ref 20–29)
Calcium: 10.4 mg/dL — ABNORMAL HIGH (ref 8.7–10.3)
Chloride: 95 mmol/L — ABNORMAL LOW (ref 96–106)
Creatinine, Ser: 0.85 mg/dL (ref 0.57–1.00)
Globulin, Total: 3 g/dL (ref 1.5–4.5)
Glucose: 92 mg/dL (ref 70–99)
Potassium: 3.8 mmol/L (ref 3.5–5.2)
Sodium: 137 mmol/L (ref 134–144)
Total Protein: 7.2 g/dL (ref 6.0–8.5)
eGFR: 71 mL/min/1.73 (ref >59)

## 2024-04-23 LAB — MICROALBUMIN / CREATININE URINE RATIO
Creatinine, Urine: 86.9 mg/dL
Microalb/Creat Ratio: <3 mg/g{creat} (ref 0–29)
Microalbumin, Urine: <3 ug/mL

## 2024-04-23 LAB — LIPID PANEL
Chol/HDL Ratio: 3 ratio (ref 0.0–4.4)
Cholesterol, Total: 169 mg/dL (ref 100–199)
HDL: 56 mg/dL (ref >39)
LDL Chol Calc (NIH): 74 mg/dL (ref 0–99)
Triglycerides: 241 mg/dL — ABNORMAL HIGH (ref 0–149)
VLDL Cholesterol Cal: 39 mg/dL (ref 5–40)

## 2024-04-23 LAB — HEMOGLOBIN A1C
Est. average glucose Bld gHb Est-mCnc: 140 mg/dL
Hgb A1c MFr Bld: 6.5 % — ABNORMAL HIGH (ref 4.8–5.6)

## 2024-04-23 NOTE — Assessment & Plan Note (Signed)
 Chronic left knee pain with history of torn meniscus surgery and injections. Persistent symptoms. No current indication for knee replacement. Prefers minimal medication use. - Prescribe meloxicam  7.5 mg oral twice daily as needed for pain, with instructions to take with food. - Advise against taking meloxicam  with ibuprofen .

## 2024-04-23 NOTE — Assessment & Plan Note (Signed)
 Chronic pain in both lower extremities, possibly due to venous insufficiency. No signs of clots. Some relief with support hose. - Refer to vein specialist for evaluation of blood flow and potential venous insufficiency. - Advise use of support hose to aid circulation.

## 2024-04-27 LAB — SPECIMEN STATUS REPORT

## 2024-04-27 LAB — THYROID PANEL WITH TSH
Free Thyroxine Index: 1.9 (ref 1.2–4.9)
T3 Uptake Ratio: 26 % (ref 24–39)
T4, Total: 7.4 ug/dL (ref 4.5–12.0)
TSH: 0.89 u[IU]/mL (ref 0.450–4.500)

## 2024-04-27 LAB — IRON AND TIBC
Iron Saturation: 20 % (ref 15–55)
Iron: 68 ug/dL (ref 27–139)
Total Iron Binding Capacity: 339 ug/dL (ref 250–450)
UIBC: 271 ug/dL (ref 118–369)

## 2024-04-29 ENCOUNTER — Encounter: Payer: Self-pay | Admitting: Nurse Practitioner

## 2024-04-30 ENCOUNTER — Encounter (INDEPENDENT_AMBULATORY_CARE_PROVIDER_SITE_OTHER): Admitting: Nurse Practitioner

## 2024-06-09 ENCOUNTER — Ambulatory Visit: Payer: Medicare Other

## 2024-06-09 DIAGNOSIS — Z0000 Encounter for general adult medical examination without abnormal findings: Secondary | ICD-10-CM | POA: Diagnosis not present

## 2024-06-09 NOTE — Patient Instructions (Addendum)
 Terri Barajas,  Thank you for taking the time for your Medicare Wellness Visit. I appreciate your continued commitment to your health goals. Please review the care plan we discussed, and feel free to reach out if I can assist you further.  Please note that Annual Wellness Visits do not include a physical exam. Some assessments may be limited, especially if the visit was conducted virtually. If needed, we may recommend an in-person follow-up with your provider.  Ongoing Care Seeing your primary care provider every 3 to 6 months helps us  monitor your health and provide consistent, personalized care.   Referrals If a referral was made during today's visit and you haven't received any updates within two weeks, please contact the referred provider directly to check on the status.  Recommended Screenings:  Health Maintenance  Topic Date Due   Complete foot exam   Never done   Eye exam for diabetics  01/14/2024   Medicare Annual Wellness Visit  05/28/2024   COVID-19 Vaccine (8 - 2025-26 season) 06/02/2024   Hemoglobin A1C  10/20/2024   Yearly kidney function blood test for diabetes  04/22/2025   Yearly kidney health urinalysis for diabetes  04/22/2025   DTaP/Tdap/Td vaccine (2 - Td or Tdap) 02/11/2031   Pneumococcal Vaccine for age over 44  Completed   Flu Shot  Completed   Osteoporosis screening with Bone Density Scan  Completed   Hepatitis C Screening  Completed   Zoster (Shingles) Vaccine  Completed   Meningitis B Vaccine  Aged Out   Breast Cancer Screening  Discontinued   Colon Cancer Screening  Discontinued       06/09/2024   12:37 PM  Advanced Directives  Does Patient Have a Medical Advance Directive? Yes  Type of Estate Agent of [place];Living will  Copy of Healthcare Power of Attorney in Chart? No - copy requested    Vision: Annual vision screenings are recommended for early detection of glaucoma, cataracts, and diabetic retinopathy. These exams can  also reveal signs of chronic conditions such as diabetes and high blood pressure.  Dental: Annual dental screenings help detect early signs of oral cancer, gum disease, and other conditions linked to overall health, including heart disease and diabetes.  Please see the attached documents for additional preventive care recommendations.

## 2024-06-09 NOTE — Progress Notes (Signed)
 "  Chief Complaint  Patient presents with   Medicare Wellness     Subjective:   Terri Barajas is a 78 y.o. female who presents for a Medicare Annual Wellness Visit.  Visit info / Clinical Intake: Medicare Wellness Visit Type:: Subsequent Annual Wellness Visit Persons participating in visit and providing information:: patient Medicare Wellness Visit Mode:: Telephone If telephone:: video declined Since this visit was completed virtually, some vitals may be partially provided or unavailable. Missing vitals are due to the limitations of the virtual format.: Documented vitals are patient reported If Telephone or Video please confirm:: I connected with patient using audio/video enable telemedicine. I verified patient identity with two identifiers, discussed telehealth limitations, and patient agreed to proceed. Patient Location:: home Provider Location:: office Interpreter Needed?: No Pre-visit prep was completed: yes AWV questionnaire completed by patient prior to visit?: no Living arrangements:: lives with spouse/significant other Patient's Overall Health Status Rating: excellent Typical amount of pain: some Does pain affect daily life?: no Are you currently prescribed opioids?: no  Dietary Habits and Nutritional Risks How many meals a day?: 2 Eats fruit and vegetables daily?: (!) no (vegetables daily, but not fruit) Most meals are obtained by: preparing own meals In the last 2 weeks, have you had any of the following?: none Diabetic:: (!) yes Any non-healing wounds?: no How often do you check your BS?: 0 Would you like to be referred to a Nutritionist or for Diabetic Management? : no  Functional Status Activities of Daily Living (to include ambulation/medication): Independent Ambulation: Independent Medication Administration: Independent Home Management (perform basic housework or laundry): Independent Manage your own finances?: yes Primary transportation is:  driving Concerns about vision?: no *vision screening is required for WTM* Concerns about hearing?: (!) yes (thinks may need a hearing test) Uses hearing aids?: no  Fall Screening Falls in the past year?: 0 Number of falls in past year: 0 Was there an injury with Fall?: 0 Fall Risk Category Calculator: 0 Patient Fall Risk Level: Low Fall Risk  Fall Risk Patient at Risk for Falls Due to: Medication side effect Fall risk Follow up: Falls prevention discussed; Falls evaluation completed  Home and Transportation Safety: All rugs have non-skid backing?: yes All stairs or steps have railings?: yes Grab bars in the bathtub or shower?: yes Have non-skid surface in bathtub or shower?: yes Good home lighting?: yes Regular seat belt use?: yes Hospital stays in the last year:: no  Cognitive Assessment Difficulty concentrating, remembering, or making decisions? : no Will 6CIT or Mini Cog be Completed: yes What year is it?: 0 points What month is it?: 0 points Give patient an address phrase to remember (5 components): [address] About what time is it?: 0 points Count backwards from 20 to 1: 0 points Say the months of the year in reverse: 0 points Repeat the address phrase from earlier: 0 points 6 CIT Score: 0 points  Advance Directives (For Healthcare) Does Patient Have a Medical Advance Directive?: Yes Type of Advance Directive: Healthcare Power of [place]; Living will Copy of Healthcare Power of Attorney in Chart?: No - copy requested Copy of Living Will in Chart?: No - copy requested  Reviewed/Updated  Reviewed/Updated: Reviewed All (Medical, Surgical, Family, Medications, Allergies, Care Teams, Patient Goals)    Allergies (verified) Shellfish allergy, Atorvastatin , Atracurium & derivatives, Atropine, Iodinated contrast media, Iodine, and Statins   Current Medications (verified) Outpatient Encounter Medications as of 06/09/2024  Medication Sig   acetaminophen   (TYLENOL ) 500  MG tablet Take 500-1,000 mg by mouth every 6 (six) hours as needed for moderate pain.   ALPRAZolam  (XANAX ) 0.25 MG tablet Take 1 tablet (0.25 mg total) by mouth daily as needed for anxiety.   AMBULATORY NON FORMULARY MEDICATION Nitroglycerin  0.125% gel to [facility]. You should apply a pea size amount to your rectum twice daily for 2 weeks.   ascorbic acid (VITAMIN C) 500 MG tablet Take 500 mg by mouth daily.   benzonatate  (TESSALON ) 100 MG capsule Take 1 capsule (100 mg total) by mouth 2 (two) times daily as needed for cough.   Biotin 5 MG CAPS Take 5 mg by mouth daily.   cholecalciferol (VITAMIN D3) 25 MCG (1000 UNIT) tablet Take 3,000 Units by mouth daily.   Cyanocobalamin  (VITAMIN B 12) 100 MCG LOZG Take 1 tablet by mouth daily.   diltiazem  (CARDIZEM ) 30 MG tablet TAKE 1 TABLET BY MOUTH TWICE  DAILY   dorzolamide-timolol (COSOPT) 22.3-6.8 MG/ML ophthalmic solution Apply 1 drop to eye in the morning and at bedtime.   Evolocumab  (REPATHA  SURECLICK) 140 MG/ML SOAJ INJECT 140 MG INTO THE SKIN EVERY 14 (FOURTEEN) DAYS.   fluticasone  (FLONASE ) 50 MCG/ACT nasal spray Place 1 spray into both nostrils daily.   hydrochlorothiazide  (HYDRODIURIL ) 25 MG tablet TAKE 1 TABLET BY MOUTH DAILY   hydrocortisone  (ANUSOL -HC) 25 MG suppository Place 1 suppository (25 mg total) rectally 2 (two) times daily as needed for hemorrhoids or anal itching.   latanoprost (XALATAN) 0.005 % ophthalmic solution Place 1 drop into both eyes at bedtime.    loratadine (CLARITIN REDITABS) 10 MG dissolvable tablet Take 10 mg by mouth daily as needed for allergies.   meclizine  (ANTIVERT ) 25 MG tablet Take 1 tablet (25 mg total) by mouth 3 (three) times daily as needed for dizziness.   meloxicam  (MOBIC ) 7.5 MG tablet Take 1 tablet (7.5 mg total) by mouth 2 (two) times daily as needed for pain. ALWAYS TAKE WITH FOOD   Multiple Vitamins-Minerals (CENTRUM SILVER 50+WOMEN PO) Take 1 tablet by mouth daily.   omeprazole  (PRILOSEC) 20 MG capsule Take 20 mg by mouth 2 (two) times daily before a meal.   polyethylene glycol (MIRALAX  / GLYCOLAX ) 17 g packet Take 17 g by mouth daily as needed for moderate constipation.   Probiotic Product (ALIGN PO) Take 1 capsule by mouth daily.   RHOPRESSA 0.02 % SOLN Place 1 drop into both eyes every evening.   vitamin E 180 MG (400 UNITS) capsule Take 400 Units by mouth daily.   calcium  carbonate (OSCAL) 1500 (600 Ca) MG TABS tablet Take 600 mg of elemental calcium  by mouth daily. (Patient not taking: Reported on 06/09/2024)   No facility-administered encounter medications on file as of 06/09/2024.    History: Past Medical History:  Diagnosis Date   Anxiety    Arthritis    Complication of anesthesia    some type of sedation medication made her feel crazy   Dysrhythmia    Family history of adverse reaction to anesthesia    daughter has Malignant Hyperthermia hx -    GERD (gastroesophageal reflux disease)    Glaucoma    H/O: hysterectomy 1978   Heart disease    History of hiatal hernia    Hypertension    MVP (mitral valve prolapse)    Panic attacks    Pre-diabetes    Sciatica    Past Surgical History:  Procedure Laterality Date   APPENDECTOMY  1972   BACK SURGERY  07/14/2020  chipped disc   BIOPSY  07/06/2018   Procedure: BIOPSY;  Surgeon: Wilhelmenia Aloha Raddle., MD;  Location: [facility] ENDOSCOPY;  Service: Gastroenterology;;   BIOPSY  03/04/2022   Procedure: BIOPSY;  Surgeon: Wilhelmenia Aloha Raddle., MD;  Location: [facility];  Service: Gastroenterology;;   CATARACT EXTRACTION Bilateral 2013   CESAREAN SECTION  1976 and 1978   COLONOSCOPY     COLONOSCOPY WITH PROPOFOL  N/A 03/04/2022   Procedure: COLONOSCOPY WITH PROPOFOL ;  Surgeon: Wilhelmenia Aloha Raddle., MD;  Location: [facility] ENDOSCOPY;  Service: Gastroenterology;  Laterality: N/A;   ESOPHAGOGASTRODUODENOSCOPY (EGD) WITH PROPOFOL  N/A 07/06/2018   Procedure: ESOPHAGOGASTRODUODENOSCOPY (EGD) WITH PROPOFOL ;   Surgeon: Wilhelmenia Aloha Raddle., MD;  Location: [facility] ENDOSCOPY;  Service: Gastroenterology;  Laterality: N/A;   ESOPHAGOGASTRODUODENOSCOPY (EGD) WITH PROPOFOL  N/A 03/04/2022   Procedure: ESOPHAGOGASTRODUODENOSCOPY (EGD) WITH PROPOFOL ;  Surgeon: Wilhelmenia Aloha Raddle., MD;  Location: [facility] ENDOSCOPY;  Service: Gastroenterology;  Laterality: N/A;   HAND SURGERY Bilateral    HEMORRHOID SURGERY  2018   KNEE CARTILAGE SURGERY Left 01/28/2022   PLANTAR FASCIA RELEASE Right    POLYPECTOMY  03/04/2022   Procedure: POLYPECTOMY;  Surgeon: Wilhelmenia Aloha Raddle., MD;  Location: [facility];  Service: Gastroenterology;;   S/P Hysterectomy   1987   SAVORY DILATION N/A 03/04/2022   Procedure: HARLEY DILATION;  Surgeon: Wilhelmenia Aloha Raddle., MD;  Location: [facility] ENDOSCOPY;  Service: Gastroenterology;  Laterality: N/A;   Family History  Problem Relation Age of Onset   Pancreatic cancer Mother    Alcohol abuse Father    Asthma Sister    Hypertension Sister    Arthritis Sister    Arthritis Sister    Rheum arthritis Sister    Diabetes Brother    Arthritis Brother    Hyperlipidemia Brother    Hypertension Brother    Glaucoma Brother    Colon polyps Brother    Prostate cancer Brother    Congenital heart disease Brother    Throat cancer Brother    Alcohol abuse Brother    Healthy Daughter    Healthy Son    Colon cancer Neg Hx    Esophageal cancer Neg Hx    Inflammatory bowel disease Neg Hx    Liver disease Neg Hx    Rectal cancer Neg Hx    Stomach cancer Neg Hx    Social History   Occupational History   Occupation: retired  Tobacco Use   Smoking status: Never    Passive exposure: Never   Smokeless tobacco: Never  Vaping Use   Vaping status: Never Used  Substance and Sexual Activity   Alcohol use: Never   Drug use: Never   Sexual activity: Yes    Partners: Male   Tobacco Counseling Counseling given: Not Answered  SDOH Screenings   Food Insecurity: No Food Insecurity (06/09/2024)   Housing: Unknown (06/09/2024)  Transportation Needs: No Transportation Needs (06/09/2024)  Utilities: Not At Risk (06/09/2024)  Alcohol Screen: Low Risk (06/09/2024)  Depression (PHQ2-9): Low Risk (06/09/2024)  Financial Resource Strain: Low Risk (06/09/2024)  Physical Activity: Insufficiently Active (06/09/2024)  Social Connections: Socially Integrated (06/09/2024)  Stress: No Stress Concern Present (06/09/2024)  Tobacco Use: Low Risk (06/09/2024)  Health Literacy: Adequate Health Literacy (06/09/2024)   See flowsheets for full screening details  Depression Screen PHQ 2 & 9 Depression Scale- Over the past 2 weeks, how often have you been bothered by any of the following problems? Little interest or pleasure in doing things: 0 Feeling down, depressed, or hopeless (PHQ Adolescent also includes...irritable):  0 PHQ-2 Total Score: 0 Trouble falling or staying asleep, or sleeping too much: 0 Feeling tired or having little energy: 0 Poor appetite or overeating (PHQ Adolescent also includes...weight loss): 0 Feeling bad about yourself - or that you are a failure or have let yourself or your family down: 0 Trouble concentrating on things, such as reading the newspaper or watching television (PHQ Adolescent also includes...like school work): 0 Moving or speaking so slowly that other people could have noticed. Or the opposite - being so fidgety or restless that you have been moving around a lot more than usual: 0 Thoughts that you would be better off dead, or of hurting yourself in some way: 0 PHQ-9 Total Score: 0 If you checked off any problems, how difficult have these problems made it for you to do your work, take care of things at home, or get along with other people?: Not difficult at all     Goals Addressed             This Visit's Progress    Patient Stated       06/09/2024, wants to go to the gym more             Objective:    Today's Vitals   There is no height or  weight on file to calculate BMI.  Hearing/Vision screen Hearing Screening - Comments:: Will get hearing checked Vision Screening - Comments:: Regular eye exams Immunizations and Health Maintenance Health Maintenance  Topic Date Due   FOOT EXAM  Never done   OPHTHALMOLOGY EXAM  01/14/2024   COVID-19 Vaccine (8 - 2025-26 season) 06/02/2024   HEMOGLOBIN A1C  10/20/2024   Diabetic kidney evaluation - eGFR measurement  04/22/2025   Diabetic kidney evaluation - Urine ACR  04/22/2025   Medicare Annual Wellness (AWV)  06/09/2025   DTaP/Tdap/Td (2 - Td or Tdap) 02/11/2031   Pneumococcal Vaccine: 50+ Years  Completed   Influenza Vaccine  Completed   Bone Density Scan  Completed   Hepatitis C Screening  Completed   Zoster Vaccines- Shingrix   Completed   Meningococcal B Vaccine  Aged Out   Mammogram  Discontinued   Colonoscopy  Discontinued        Assessment/Plan:  This is a routine wellness examination for [place].  Patient Care Team: Georgina Speaks, FNP as PCP - General (General Practice) Raford Riggs, MD as PCP - Cardiology (Cardiology) Tobie Neila NOVAK, MD as Referring Physician (Podiatry) Pearson, Vallie J, RPH (Inactive) (Pharmacist) Rayne Doffing, OD (Optometry) Austin Olam CROME, MD as Consulting Physician (Ophthalmology) Dohmeier, Dedra, MD as Consulting Physician (Neurology) Mansouraty, Aloha Raddle., MD as Consulting Physician (Gastroenterology) Josefina Chew, MD as Consulting Physician (Orthopedic Surgery) Mannam, Praveen, MD as Consulting Physician (Pulmonary Disease) Luciano Standing, MD as Consulting Physician (Otolaryngology)  I have personally reviewed and noted the following in the patients chart:   Medical and social history Use of alcohol, tobacco or illicit drugs  Current medications and supplements including opioid prescriptions. Functional ability and status Nutritional status Physical activity Advanced directives List of other physicians Hospitalizations,  surgeries, and ER visits in previous 12 months Vitals Screenings to include cognitive, depression, and falls Referrals and appointments  No orders of the defined types were placed in this encounter.  In addition, I have reviewed and discussed with patient certain preventive protocols, quality metrics, and best practice recommendations. A written personalized care plan for preventive services as well as general preventive health recommendations were provided to patient.   Ardella  FORBES Dawn, LPN   [id number]   Return in 1 year (on 06/09/2025).  After Visit Summary: (Pick Up) Due to this being a telephonic visit, with patients personalized plan was offered to patient and patient has requested to Pick up at office.  Nurse Notes: HM Addressed: Diabetic Foot Exam recommended  "

## 2024-06-12 ENCOUNTER — Other Ambulatory Visit: Payer: Self-pay | Admitting: Family Medicine

## 2024-06-12 DIAGNOSIS — M1712 Unilateral primary osteoarthritis, left knee: Secondary | ICD-10-CM

## 2024-07-08 ENCOUNTER — Telehealth: Payer: Self-pay

## 2024-07-08 NOTE — Telephone Encounter (Signed)
 Rescheduled  apt due to PFT not scheduled.

## 2024-07-09 ENCOUNTER — Ambulatory Visit: Admitting: Pulmonary Disease

## 2024-07-21 ENCOUNTER — Other Ambulatory Visit: Payer: Self-pay

## 2024-07-21 DIAGNOSIS — M79604 Pain in right leg: Secondary | ICD-10-CM

## 2024-08-13 ENCOUNTER — Encounter: Admitting: Gastroenterology

## 2024-08-17 ENCOUNTER — Encounter: Admitting: Vascular Surgery

## 2024-08-17 ENCOUNTER — Ambulatory Visit (HOSPITAL_COMMUNITY)

## 2024-08-19 ENCOUNTER — Encounter

## 2024-08-19 ENCOUNTER — Ambulatory Visit: Admitting: Pulmonary Disease

## 2024-10-25 ENCOUNTER — Ambulatory Visit: Admitting: Nurse Practitioner

## 2025-04-25 ENCOUNTER — Encounter: Payer: Self-pay | Admitting: Nurse Practitioner

## 2025-06-07 ENCOUNTER — Ambulatory Visit: Payer: Self-pay
